# Patient Record
Sex: Male | Born: 1945 | Race: White | Hispanic: No | Marital: Married | Smoking: Former smoker
Health system: Southern US, Community
[De-identification: ages and names within clinical notes are randomized; demographics above are authoritative.]

## PROBLEM LIST (undated history)

## (undated) DIAGNOSIS — J45909 Unspecified asthma, uncomplicated: Secondary | ICD-10-CM

## (undated) DIAGNOSIS — H269 Unspecified cataract: Secondary | ICD-10-CM

## (undated) DIAGNOSIS — T7840XA Allergy, unspecified, initial encounter: Secondary | ICD-10-CM

## (undated) DIAGNOSIS — K219 Gastro-esophageal reflux disease without esophagitis: Secondary | ICD-10-CM

## (undated) DIAGNOSIS — M1990 Unspecified osteoarthritis, unspecified site: Secondary | ICD-10-CM

## (undated) DIAGNOSIS — I82409 Acute embolism and thrombosis of unspecified deep veins of unspecified lower extremity: Secondary | ICD-10-CM

## (undated) DIAGNOSIS — J189 Pneumonia, unspecified organism: Secondary | ICD-10-CM

## (undated) DIAGNOSIS — E785 Hyperlipidemia, unspecified: Secondary | ICD-10-CM

## (undated) DIAGNOSIS — I10 Essential (primary) hypertension: Secondary | ICD-10-CM

## (undated) DIAGNOSIS — D689 Coagulation defect, unspecified: Secondary | ICD-10-CM

## (undated) DIAGNOSIS — F419 Anxiety disorder, unspecified: Secondary | ICD-10-CM

## (undated) DIAGNOSIS — Z9289 Personal history of other medical treatment: Secondary | ICD-10-CM

## (undated) HISTORY — DX: Anxiety disorder, unspecified: F41.9

## (undated) HISTORY — DX: Gastro-esophageal reflux disease without esophagitis: K21.9

## (undated) HISTORY — DX: Coagulation defect, unspecified: D68.9

## (undated) HISTORY — PX: GANGLION CYST EXCISION: SHX1691

## (undated) HISTORY — DX: Unspecified cataract: H26.9

## (undated) HISTORY — PX: TONSILLECTOMY: SUR1361

## (undated) HISTORY — PX: HERNIA REPAIR: SHX51

## (undated) HISTORY — DX: Unspecified asthma, uncomplicated: J45.909

## (undated) HISTORY — DX: Hyperlipidemia, unspecified: E78.5

## (undated) HISTORY — DX: Essential (primary) hypertension: I10

## (undated) HISTORY — PX: VASCULAR SURGERY: SHX849

## (undated) HISTORY — DX: Allergy, unspecified, initial encounter: T78.40XA

## (undated) HISTORY — DX: Unspecified osteoarthritis, unspecified site: M19.90

## (undated) MED FILL — Dexamethasone Sodium Phosphate Inj 100 MG/10ML: INTRAMUSCULAR | Qty: 1 | Status: AC

## (undated) MED FILL — Azacitidine For Inj 100 MG: INTRAMUSCULAR | Qty: 14 | Status: AC

---

## 2006-12-09 ENCOUNTER — Ambulatory Visit (HOSPITAL_COMMUNITY): Admission: RE | Admit: 2006-12-09 | Discharge: 2006-12-09 | Payer: Self-pay | Admitting: Optometry

## 2006-12-25 ENCOUNTER — Ambulatory Visit (HOSPITAL_COMMUNITY): Admission: RE | Admit: 2006-12-25 | Discharge: 2006-12-25 | Payer: Self-pay | Admitting: Family Medicine

## 2010-12-27 LAB — CREATININE, SERUM
Creatinine, Ser: 0.93
GFR calc Af Amer: >60
GFR calc non Af Amer: >60

## 2015-06-02 DIAGNOSIS — M542 Cervicalgia: Secondary | ICD-10-CM | POA: Diagnosis not present

## 2015-06-05 DIAGNOSIS — I82C12 Acute embolism and thrombosis of left internal jugular vein: Secondary | ICD-10-CM | POA: Diagnosis not present

## 2015-06-05 DIAGNOSIS — J45909 Unspecified asthma, uncomplicated: Secondary | ICD-10-CM | POA: Diagnosis not present

## 2015-06-05 DIAGNOSIS — M7989 Other specified soft tissue disorders: Secondary | ICD-10-CM | POA: Diagnosis not present

## 2015-06-05 DIAGNOSIS — I2699 Other pulmonary embolism without acute cor pulmonale: Secondary | ICD-10-CM | POA: Diagnosis not present

## 2015-06-05 DIAGNOSIS — Z79899 Other long term (current) drug therapy: Secondary | ICD-10-CM | POA: Diagnosis not present

## 2015-06-05 DIAGNOSIS — I871 Compression of vein: Secondary | ICD-10-CM | POA: Diagnosis not present

## 2015-06-05 DIAGNOSIS — Z9981 Dependence on supplemental oxygen: Secondary | ICD-10-CM | POA: Diagnosis not present

## 2015-06-05 DIAGNOSIS — I82622 Acute embolism and thrombosis of deep veins of left upper extremity: Secondary | ICD-10-CM | POA: Diagnosis not present

## 2015-06-05 DIAGNOSIS — I8290 Acute embolism and thrombosis of unspecified vein: Secondary | ICD-10-CM | POA: Diagnosis not present

## 2015-06-05 DIAGNOSIS — Z7901 Long term (current) use of anticoagulants: Secondary | ICD-10-CM | POA: Diagnosis not present

## 2015-06-05 DIAGNOSIS — R221 Localized swelling, mass and lump, neck: Secondary | ICD-10-CM | POA: Diagnosis not present

## 2015-06-05 DIAGNOSIS — I82A12 Acute embolism and thrombosis of left axillary vein: Secondary | ICD-10-CM | POA: Diagnosis not present

## 2015-06-05 DIAGNOSIS — I82B12 Acute embolism and thrombosis of left subclavian vein: Secondary | ICD-10-CM | POA: Diagnosis not present

## 2015-06-05 DIAGNOSIS — R69 Illness, unspecified: Secondary | ICD-10-CM | POA: Diagnosis not present

## 2015-06-06 DIAGNOSIS — I82622 Acute embolism and thrombosis of deep veins of left upper extremity: Secondary | ICD-10-CM | POA: Diagnosis not present

## 2015-06-06 DIAGNOSIS — Z9981 Dependence on supplemental oxygen: Secondary | ICD-10-CM | POA: Diagnosis not present

## 2015-06-06 DIAGNOSIS — I82A12 Acute embolism and thrombosis of left axillary vein: Secondary | ICD-10-CM | POA: Diagnosis not present

## 2015-06-06 DIAGNOSIS — Z7901 Long term (current) use of anticoagulants: Secondary | ICD-10-CM | POA: Diagnosis not present

## 2015-06-07 DIAGNOSIS — I82B12 Acute embolism and thrombosis of left subclavian vein: Secondary | ICD-10-CM | POA: Diagnosis not present

## 2015-06-07 DIAGNOSIS — I82A12 Acute embolism and thrombosis of left axillary vein: Secondary | ICD-10-CM | POA: Diagnosis not present

## 2015-06-07 DIAGNOSIS — Z9981 Dependence on supplemental oxygen: Secondary | ICD-10-CM | POA: Diagnosis not present

## 2015-06-07 DIAGNOSIS — I82622 Acute embolism and thrombosis of deep veins of left upper extremity: Secondary | ICD-10-CM | POA: Diagnosis not present

## 2015-06-07 DIAGNOSIS — J45909 Unspecified asthma, uncomplicated: Secondary | ICD-10-CM | POA: Diagnosis not present

## 2015-06-07 DIAGNOSIS — Z7901 Long term (current) use of anticoagulants: Secondary | ICD-10-CM | POA: Diagnosis not present

## 2015-06-08 DIAGNOSIS — I82A12 Acute embolism and thrombosis of left axillary vein: Secondary | ICD-10-CM | POA: Diagnosis not present

## 2015-06-08 DIAGNOSIS — I2699 Other pulmonary embolism without acute cor pulmonale: Secondary | ICD-10-CM | POA: Diagnosis not present

## 2015-06-08 DIAGNOSIS — I82622 Acute embolism and thrombosis of deep veins of left upper extremity: Secondary | ICD-10-CM | POA: Diagnosis not present

## 2015-06-09 DIAGNOSIS — I2699 Other pulmonary embolism without acute cor pulmonale: Secondary | ICD-10-CM | POA: Insufficient documentation

## 2015-06-15 DIAGNOSIS — I82622 Acute embolism and thrombosis of deep veins of left upper extremity: Secondary | ICD-10-CM | POA: Diagnosis not present

## 2015-06-15 DIAGNOSIS — K219 Gastro-esophageal reflux disease without esophagitis: Secondary | ICD-10-CM | POA: Diagnosis not present

## 2015-06-15 DIAGNOSIS — R911 Solitary pulmonary nodule: Secondary | ICD-10-CM | POA: Diagnosis not present

## 2015-06-20 DIAGNOSIS — I82A12 Acute embolism and thrombosis of left axillary vein: Secondary | ICD-10-CM | POA: Diagnosis not present

## 2015-06-20 DIAGNOSIS — I721 Aneurysm of artery of upper extremity: Secondary | ICD-10-CM | POA: Diagnosis not present

## 2015-06-20 DIAGNOSIS — R911 Solitary pulmonary nodule: Secondary | ICD-10-CM | POA: Diagnosis not present

## 2015-06-20 DIAGNOSIS — Z7901 Long term (current) use of anticoagulants: Secondary | ICD-10-CM | POA: Diagnosis not present

## 2015-06-26 DIAGNOSIS — I721 Aneurysm of artery of upper extremity: Secondary | ICD-10-CM | POA: Diagnosis not present

## 2015-06-26 DIAGNOSIS — E756 Lipid storage disorder, unspecified: Secondary | ICD-10-CM | POA: Diagnosis not present

## 2015-06-26 DIAGNOSIS — I82A12 Acute embolism and thrombosis of left axillary vein: Secondary | ICD-10-CM | POA: Diagnosis not present

## 2015-06-26 DIAGNOSIS — I2699 Other pulmonary embolism without acute cor pulmonale: Secondary | ICD-10-CM | POA: Diagnosis not present

## 2015-06-26 DIAGNOSIS — D6859 Other primary thrombophilia: Secondary | ICD-10-CM | POA: Diagnosis not present

## 2015-06-26 DIAGNOSIS — I82B12 Acute embolism and thrombosis of left subclavian vein: Secondary | ICD-10-CM | POA: Diagnosis not present

## 2015-12-04 DIAGNOSIS — J4520 Mild intermittent asthma, uncomplicated: Secondary | ICD-10-CM | POA: Diagnosis not present

## 2015-12-04 DIAGNOSIS — Z23 Encounter for immunization: Secondary | ICD-10-CM | POA: Diagnosis not present

## 2015-12-04 DIAGNOSIS — Z0000 Encounter for general adult medical examination without abnormal findings: Secondary | ICD-10-CM | POA: Diagnosis not present

## 2015-12-04 DIAGNOSIS — R69 Illness, unspecified: Secondary | ICD-10-CM | POA: Diagnosis not present

## 2015-12-04 DIAGNOSIS — R7301 Impaired fasting glucose: Secondary | ICD-10-CM | POA: Diagnosis not present

## 2015-12-04 DIAGNOSIS — K219 Gastro-esophageal reflux disease without esophagitis: Secondary | ICD-10-CM | POA: Diagnosis not present

## 2015-12-04 DIAGNOSIS — Z6822 Body mass index (BMI) 22.0-22.9, adult: Secondary | ICD-10-CM | POA: Diagnosis not present

## 2015-12-04 DIAGNOSIS — E7800 Pure hypercholesterolemia, unspecified: Secondary | ICD-10-CM | POA: Diagnosis not present

## 2015-12-19 DIAGNOSIS — I82A12 Acute embolism and thrombosis of left axillary vein: Secondary | ICD-10-CM | POA: Diagnosis not present

## 2015-12-19 DIAGNOSIS — I82C12 Acute embolism and thrombosis of left internal jugular vein: Secondary | ICD-10-CM | POA: Diagnosis not present

## 2015-12-19 DIAGNOSIS — I82B12 Acute embolism and thrombosis of left subclavian vein: Secondary | ICD-10-CM | POA: Diagnosis not present

## 2015-12-19 DIAGNOSIS — R911 Solitary pulmonary nodule: Secondary | ICD-10-CM | POA: Diagnosis not present

## 2015-12-19 DIAGNOSIS — R918 Other nonspecific abnormal finding of lung field: Secondary | ICD-10-CM | POA: Diagnosis not present

## 2015-12-19 DIAGNOSIS — Z7982 Long term (current) use of aspirin: Secondary | ICD-10-CM | POA: Diagnosis not present

## 2015-12-19 DIAGNOSIS — I871 Compression of vein: Secondary | ICD-10-CM | POA: Diagnosis not present

## 2016-06-05 DIAGNOSIS — M7989 Other specified soft tissue disorders: Secondary | ICD-10-CM | POA: Diagnosis not present

## 2016-06-05 DIAGNOSIS — Z86718 Personal history of other venous thrombosis and embolism: Secondary | ICD-10-CM | POA: Diagnosis not present

## 2016-06-05 DIAGNOSIS — R600 Localized edema: Secondary | ICD-10-CM | POA: Diagnosis not present

## 2016-06-05 DIAGNOSIS — M79662 Pain in left lower leg: Secondary | ICD-10-CM | POA: Diagnosis not present

## 2016-06-05 DIAGNOSIS — M79605 Pain in left leg: Secondary | ICD-10-CM | POA: Diagnosis not present

## 2016-06-07 DIAGNOSIS — Z0100 Encounter for examination of eyes and vision without abnormal findings: Secondary | ICD-10-CM | POA: Diagnosis not present

## 2016-06-07 DIAGNOSIS — H2510 Age-related nuclear cataract, unspecified eye: Secondary | ICD-10-CM | POA: Diagnosis not present

## 2016-06-13 DIAGNOSIS — I82812 Embolism and thrombosis of superficial veins of left lower extremities: Secondary | ICD-10-CM | POA: Diagnosis not present

## 2016-10-22 DIAGNOSIS — S01512A Laceration without foreign body of oral cavity, initial encounter: Secondary | ICD-10-CM | POA: Diagnosis not present

## 2016-10-22 DIAGNOSIS — K130 Diseases of lips: Secondary | ICD-10-CM | POA: Diagnosis not present

## 2016-10-22 DIAGNOSIS — Z7289 Other problems related to lifestyle: Secondary | ICD-10-CM | POA: Diagnosis not present

## 2016-10-22 DIAGNOSIS — Z7901 Long term (current) use of anticoagulants: Secondary | ICD-10-CM | POA: Diagnosis not present

## 2016-10-22 DIAGNOSIS — Z7982 Long term (current) use of aspirin: Secondary | ICD-10-CM | POA: Diagnosis not present

## 2016-10-22 DIAGNOSIS — K148 Other diseases of tongue: Secondary | ICD-10-CM | POA: Diagnosis not present

## 2016-10-22 DIAGNOSIS — Z86718 Personal history of other venous thrombosis and embolism: Secondary | ICD-10-CM | POA: Diagnosis not present

## 2016-11-18 DIAGNOSIS — Z79899 Other long term (current) drug therapy: Secondary | ICD-10-CM | POA: Diagnosis not present

## 2016-11-18 DIAGNOSIS — J45909 Unspecified asthma, uncomplicated: Secondary | ICD-10-CM | POA: Diagnosis not present

## 2016-11-18 DIAGNOSIS — Z7901 Long term (current) use of anticoagulants: Secondary | ICD-10-CM | POA: Diagnosis not present

## 2016-11-18 DIAGNOSIS — S9002XA Contusion of left ankle, initial encounter: Secondary | ICD-10-CM | POA: Diagnosis not present

## 2016-11-18 DIAGNOSIS — W228XXA Striking against or struck by other objects, initial encounter: Secondary | ICD-10-CM | POA: Diagnosis not present

## 2016-11-18 DIAGNOSIS — Z86718 Personal history of other venous thrombosis and embolism: Secondary | ICD-10-CM | POA: Diagnosis not present

## 2016-11-18 DIAGNOSIS — S8012XA Contusion of left lower leg, initial encounter: Secondary | ICD-10-CM | POA: Diagnosis not present

## 2016-12-05 DIAGNOSIS — J4520 Mild intermittent asthma, uncomplicated: Secondary | ICD-10-CM | POA: Diagnosis not present

## 2016-12-05 DIAGNOSIS — K219 Gastro-esophageal reflux disease without esophagitis: Secondary | ICD-10-CM | POA: Diagnosis not present

## 2016-12-05 DIAGNOSIS — R69 Illness, unspecified: Secondary | ICD-10-CM | POA: Diagnosis not present

## 2016-12-05 DIAGNOSIS — Z86718 Personal history of other venous thrombosis and embolism: Secondary | ICD-10-CM | POA: Diagnosis not present

## 2016-12-05 DIAGNOSIS — R7301 Impaired fasting glucose: Secondary | ICD-10-CM | POA: Diagnosis not present

## 2016-12-23 DIAGNOSIS — R69 Illness, unspecified: Secondary | ICD-10-CM | POA: Diagnosis not present

## 2017-01-23 ENCOUNTER — Encounter: Payer: Self-pay | Admitting: Family Medicine

## 2017-01-23 ENCOUNTER — Other Ambulatory Visit: Payer: Self-pay

## 2017-01-23 ENCOUNTER — Ambulatory Visit (INDEPENDENT_AMBULATORY_CARE_PROVIDER_SITE_OTHER): Payer: Medicare HMO | Admitting: Family Medicine

## 2017-01-23 VITALS — BP 138/86 | HR 68 | Temp 98.7°F | Resp 16 | Ht 70.5 in | Wt 161.1 lb

## 2017-01-23 DIAGNOSIS — I4510 Unspecified right bundle-branch block: Secondary | ICD-10-CM

## 2017-01-23 DIAGNOSIS — R69 Illness, unspecified: Secondary | ICD-10-CM | POA: Diagnosis not present

## 2017-01-23 DIAGNOSIS — J45909 Unspecified asthma, uncomplicated: Secondary | ICD-10-CM | POA: Insufficient documentation

## 2017-01-23 DIAGNOSIS — J4520 Mild intermittent asthma, uncomplicated: Secondary | ICD-10-CM

## 2017-01-23 DIAGNOSIS — F4329 Adjustment disorder with other symptoms: Secondary | ICD-10-CM

## 2017-01-23 DIAGNOSIS — E785 Hyperlipidemia, unspecified: Secondary | ICD-10-CM

## 2017-01-23 DIAGNOSIS — K219 Gastro-esophageal reflux disease without esophagitis: Secondary | ICD-10-CM

## 2017-01-23 NOTE — Progress Notes (Signed)
Chief Complaint  Patient presents with  . Hypertension  This is a new patient to establish.  His old doctor retired. He is under care for asthma.  Has not used albuterol for years.  He uses Flovent just once a day.  He states he had asthma as a child and it flared up a couple years ago, but he has not had much trouble with it since then.  I discussed with him he could perhaps stop the Flovent.  He tells me his old doctor insisted that he continue taking it.  We will review this on another day. He has hyperlipidemia that he states is mild.  He has not on any medications for this. He has a history of having a DVT in his upper extremity.  He is on Xarelto ever since that time.  He does not have any bruising or bleeding.  He is reminded not to take any NSAID use while on the blood thinner.  He is also reminded that if he falls and bumps his head that he needs to be seen in the emergency department. He has GERD that is well controlled on omeprazole.  Never had any GI bleeding. He states he is up-to-date with his colonoscopies.  He was told he does not need to have them anymore since he is over 69. He has a prescription for Xanax.  He states he has his prescription because his wife has anxiety.  When his wife has a "meltdown" he takes 1 to cope with her.  He uses this infrequently. Otherwise he is healthy.  He is not overweight.  He exercises.  He does not smoke or drink.  He attends the [employer].  Patient Active Problem List   Diagnosis Date Noted  . Asthma 01/23/2017  . GERD (gastroesophageal reflux disease) 01/23/2017  . Hyperlipidemia 01/23/2017  . Mixed emotional features as adjustment reaction 01/23/2017  . Acute pulmonary embolism ([place]) 06/09/2015  . Acute deep vein thrombosis (DVT) of axillary vein of left upper extremity ([place]) 06/06/2015    Outpatient Encounter Medications as of 01/23/2017  Medication Sig  . albuterol (PROVENTIL HFA;VENTOLIN HFA) 108 (90 Base) MCG/ACT inhaler  Inhale into the lungs.  . ALPRAZolam (XANAX) 0.5 MG tablet Take 0.5 mg by mouth.  Marland Kitchen aspirin EC 81 MG tablet Take 81 mg by mouth.  . ferrous gluconate (FERGON) 240 (27 FE) MG tablet Take 240 mg 3 (three) times daily with meals by mouth.  . fluticasone (FLOVENT HFA) 110 MCG/ACT inhaler Inhale into the lungs.  . Multiple Vitamin (MULTIVITAMIN) capsule Take 1 capsule daily by mouth.  Marland Kitchen omeprazole (PRILOSEC) 20 MG capsule Take 20 mg by mouth.  Alveda Reasons 20 MG TABS tablet Take 20 mg daily by mouth.  . [DISCONTINUED] aspirin EC 81 MG tablet Take 81 mg daily by mouth.   No facility-administered encounter medications on file as of 01/23/2017.     Past Medical History:  Diagnosis Date  . Allergy   . Anxiety   . Arthritis   . Asthma   . Cataract   . Clotting disorder ([place])   . GERD (gastroesophageal reflux disease)   . Hyperlipidemia   . Hypertension     Past Surgical History:  Procedure Laterality Date  . HERNIA REPAIR    . TONSILLECTOMY      Social History   Socioeconomic History  . Marital status: Married    Spouse name: [name]  . Number of children: 0  . Years of education: 57  .  Highest education level: High school graduate  Social Needs  . Financial resource strain: Not hard at all  . Food insecurity - worry: Never true  . Food insecurity - inability: Never true  . Transportation needs - medical: No  . Transportation needs - non-medical: No  Occupational History  . Not on file  Tobacco Use  . Smoking status: Never Smoker  . Smokeless tobacco: Never Used  Substance and Sexual Activity  . Alcohol use: Yes  . Drug use: No  . Sexual activity: Not Currently  Other Topics Concern  . Not on file  Social History Narrative   Married to [place]   She has anxiety disorder    Family History  Problem Relation Age of Onset  . Cancer Mother        pancreatic  . Arthritis Father   . Early death Father 70       Lung infiltrate    Review of Systems  Constitutional:  Negative for chills, fever and weight loss.  HENT: Negative for congestion and hearing loss.   Eyes: Negative for blurred vision and pain.  Respiratory: Negative for cough and shortness of breath.   Cardiovascular: Negative for chest pain and leg swelling.  Gastrointestinal: Negative for abdominal pain, constipation, diarrhea and heartburn.  Genitourinary: Negative for dysuria and frequency.  Musculoskeletal: Negative for falls, joint pain and myalgias.  Neurological: Negative for dizziness, seizures and headaches.  Psychiatric/Behavioral: Negative for depression. The patient is not nervous/anxious and does not have insomnia.     BP 138/86   Pulse 68   Temp 98.7 F (37.1 C) (Temporal)   Resp 16   Ht 5' 10.5" (1.791 m)   Wt 161 lb 1.3 oz (73.1 kg)   SpO2 97%   BMI 22.79 kg/m   Physical Exam  Constitutional: He is oriented to person, place, and time. He appears well-developed and well-nourished.  HENT:  Head: Normocephalic and atraumatic.  Mouth/Throat: Oropharynx is clear and moist.  Teeth well repaired  Eyes: Conjunctivae are normal. Pupils are equal, round, and reactive to light.  Neck: Normal range of motion. Neck supple. No thyromegaly present.  Cardiovascular: Normal rate, regular rhythm and normal heart sounds.  Pulmonary/Chest: Effort normal and breath sounds normal. No respiratory distress.  Abdominal: Soft. Bowel sounds are normal.  Musculoskeletal: Normal range of motion. He exhibits no edema.  Lymphadenopathy:    He has no cervical adenopathy.  Neurological: He is alert and oriented to person, place, and time.  Gait normal  Skin: Skin is warm and dry.  Psychiatric: He has a normal mood and affect. His behavior is normal. Thought content normal.  Nursing note and vitals reviewed.  ASSESSMENT/PLAN:  1. Mild intermittent asthma without complication Asymptomatic  2. Gastroesophageal reflux disease, esophagitis presence not specified Controlled with  omeprazole  3. Hyperlipidemia, unspecified hyperlipidemia type Controlled with diet  4. 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 Gilbert's disease By history  6. RBBB (right bundle branch block) History - CBC - COMPLETE METABOLIC PANEL WITH GFR - Lipid panel - VITAMIN D 25 Hydroxy (Vit-D Deficiency, Fractures) - Urinalysis, Routine w reflex microscopic   Patient Instructions  Need records Dr Scotty Court Walk every day that you are able No change in  Medicines See me in a month Call sooner for problems     [name], MD

## 2017-01-23 NOTE — Patient Instructions (Addendum)
Need records Dr Scotty Court Walk every day that you are able No change in  Medicines See me in a month Call sooner for problems

## 2017-02-12 DIAGNOSIS — E785 Hyperlipidemia, unspecified: Secondary | ICD-10-CM | POA: Diagnosis not present

## 2017-02-12 DIAGNOSIS — I4510 Unspecified right bundle-branch block: Secondary | ICD-10-CM | POA: Diagnosis not present

## 2017-02-12 DIAGNOSIS — J4520 Mild intermittent asthma, uncomplicated: Secondary | ICD-10-CM | POA: Diagnosis not present

## 2017-02-12 DIAGNOSIS — R69 Illness, unspecified: Secondary | ICD-10-CM | POA: Diagnosis not present

## 2017-02-12 DIAGNOSIS — K219 Gastro-esophageal reflux disease without esophagitis: Secondary | ICD-10-CM | POA: Diagnosis not present

## 2017-02-13 LAB — COMPLETE METABOLIC PANEL WITH GFR
AG Ratio: 1.5 (calc) (ref 1.0–2.5)
ALBUMIN MSPROF: 4.1 g/dL (ref 3.6–5.1)
ALT: 22 U/L (ref 9–46)
AST: 23 U/L (ref 10–35)
Alkaline phosphatase (APISO): 73 U/L (ref 40–115)
BILIRUBIN TOTAL: 2.2 mg/dL — AB (ref 0.2–1.2)
BUN: 15 mg/dL (ref 7–25)
CALCIUM: 9.4 mg/dL (ref 8.6–10.3)
CHLORIDE: 103 mmol/L (ref 98–110)
CO2: 27 mmol/L (ref 20–32)
CREATININE: 0.98 mg/dL (ref 0.70–1.18)
GFR, EST AFRICAN AMERICAN: 89 mL/min/{1.73_m2} (ref >=60)
GFR, EST NON AFRICAN AMERICAN: 77 mL/min/{1.73_m2} (ref >=60)
GLUCOSE: 103 mg/dL — AB (ref 65–99)
Globulin: 2.7 g/dL (calc) (ref 1.9–3.7)
Potassium: 3.9 mmol/L (ref 3.5–5.3)
Sodium: 138 mmol/L (ref 135–146)
TOTAL PROTEIN: 6.8 g/dL (ref 6.1–8.1)

## 2017-02-13 LAB — URINALYSIS, ROUTINE W REFLEX MICROSCOPIC
Bilirubin Urine: NEGATIVE
GLUCOSE, UA: NEGATIVE
HGB URINE DIPSTICK: NEGATIVE
Ketones, ur: NEGATIVE
LEUKOCYTES UA: NEGATIVE
Nitrite: NEGATIVE
PROTEIN: NEGATIVE
Specific Gravity, Urine: 1.017 (ref 1.001–1.03)
pH: 7 (ref 5.0–8.0)

## 2017-02-13 LAB — CBC
HCT: 43.5 % (ref 38.5–50.0)
HEMOGLOBIN: 15.3 g/dL (ref 13.2–17.1)
MCH: 31 pg (ref 27.0–33.0)
MCHC: 35.2 g/dL (ref 32.0–36.0)
MCV: 88.2 fL (ref 80.0–100.0)
MPV: 10.8 fL (ref 7.5–12.5)
PLATELETS: 209 10*3/uL (ref 140–400)
RBC: 4.93 10*6/uL (ref 4.20–5.80)
RDW: 12.6 % (ref 11.0–15.0)
WBC: 5.8 10*3/uL (ref 3.8–10.8)

## 2017-02-13 LAB — LIPID PANEL
CHOL/HDL RATIO: 3.4 (calc) (ref <5.0)
Cholesterol: 202 mg/dL — ABNORMAL HIGH (ref <200)
HDL: 59 mg/dL (ref >40)
LDL CHOLESTEROL (CALC): 128 mg/dL — AB
Non-HDL Cholesterol (Calc): 143 mg/dL (calc) — ABNORMAL HIGH (ref <130)
TRIGLYCERIDES: 62 mg/dL (ref <150)

## 2017-02-13 LAB — VITAMIN D 25 HYDROXY (VIT D DEFICIENCY, FRACTURES): VIT D 25 HYDROXY: 35 ng/mL (ref 30–100)

## 2017-02-24 ENCOUNTER — Ambulatory Visit: Admitting: Family Medicine

## 2017-02-28 ENCOUNTER — Ambulatory Visit (INDEPENDENT_AMBULATORY_CARE_PROVIDER_SITE_OTHER): Payer: Medicare HMO | Admitting: Family Medicine

## 2017-02-28 ENCOUNTER — Encounter: Payer: Self-pay | Admitting: Family Medicine

## 2017-02-28 ENCOUNTER — Other Ambulatory Visit: Payer: Self-pay

## 2017-02-28 VITALS — BP 144/94 | HR 76 | Temp 98.9°F | Resp 18 | Ht 71.0 in | Wt 161.0 lb

## 2017-02-28 DIAGNOSIS — J4520 Mild intermittent asthma, uncomplicated: Secondary | ICD-10-CM

## 2017-02-28 DIAGNOSIS — E785 Hyperlipidemia, unspecified: Secondary | ICD-10-CM

## 2017-02-28 DIAGNOSIS — K219 Gastro-esophageal reflux disease without esophagitis: Secondary | ICD-10-CM

## 2017-02-28 MED ORDER — FLUTICASONE PROPIONATE HFA 110 MCG/ACT IN AERO: 2.0000 | INHALATION_SPRAY | Freq: Every day | RESPIRATORY_TRACT | 6 refills | Status: DC

## 2017-02-28 NOTE — Patient Instructions (Addendum)
You are doing well Continue to eat well and exercise See me in six months Call and come in sooner for any problems

## 2017-02-28 NOTE — Progress Notes (Signed)
Chief Complaint  Patient presents with  . Follow-up    1 month  Patient is here for routine follow-up.  He feels well.  No change in medications.  We did blood work at his last visit.  The results are reviewed with him today.  He does have mild hyperlipidemia with an LDL of 122.  HDL is good at 59. He does have a mild elevated bilirubin at 2.2.  He has a history of Gilberts disease. He eats well.  He exercises. I do not yet have his old records.  He believes his health maintenance is up-to-date.  Patient Active Problem List   Diagnosis Date Noted  . Asthma 01/23/2017  . GERD (gastroesophageal reflux disease) 01/23/2017  . Hyperlipidemia 01/23/2017  . Mixed emotional features as adjustment reaction 01/23/2017  . Acute pulmonary embolism ([place]) 06/09/2015  . Acute deep vein thrombosis (DVT) of axillary vein of left upper extremity ([place]) 06/06/2015    Outpatient Encounter Medications as of 02/28/2017  Medication Sig  . albuterol (PROVENTIL HFA;VENTOLIN HFA) 108 (90 Base) MCG/ACT inhaler Inhale into the lungs.  . ALPRAZolam (XANAX) 0.5 MG tablet Take 0.5 mg by mouth.  Marland Kitchen aspirin EC 81 MG tablet Take 81 mg by mouth.  . ferrous gluconate (FERGON) 240 (27 FE) MG tablet Take 240 mg 3 (three) times daily with meals by mouth.  . fluticasone (FLOVENT HFA) 110 MCG/ACT inhaler Inhale 2 puffs into the lungs daily.  . Multiple Vitamin (MULTIVITAMIN) capsule Take 1 capsule daily by mouth.  Marland Kitchen omeprazole (PRILOSEC) 20 MG capsule Take 20 mg by mouth.  Alveda Reasons 20 MG TABS tablet Take 20 mg daily by mouth.  . [DISCONTINUED] fluticasone (FLOVENT HFA) 110 MCG/ACT inhaler Inhale into the lungs.   No facility-administered encounter medications on file as of 02/28/2017.     Allergies  Allergen Reactions  . Amoxicillin Rash  . Cephalexin Rash  . Clindamycin Rash  . Sulfamethoxazole Rash  . Trimethoprim Rash    Review of Systems  Constitutional: Negative for activity change, appetite change and  unexpected weight change.  HENT: Negative for congestion and dental problem.   Eyes: Negative for photophobia and visual disturbance.  Respiratory: Negative for choking and shortness of breath.   Cardiovascular: Negative for chest pain, palpitations and leg swelling.  Gastrointestinal: Negative for constipation and diarrhea.  Genitourinary: Negative for difficulty urinating and frequency.  Musculoskeletal: Negative for arthralgias and back pain.  Neurological: Negative for dizziness and headaches.  Psychiatric/Behavioral: Negative for dysphoric mood. The patient is not nervous/anxious.     BP (!) 144/94 (BP Location: Left Arm, Patient Position: Sitting, Cuff Size: Normal)   Pulse 76   Temp 98.9 F (37.2 C) (Temporal)   Resp 18   Ht 5' 11" (1.803 m)   Wt 161 lb 0.6 oz (73 kg)   SpO2 98%   BMI 22.46 kg/m   Physical Exam  Constitutional: He is oriented to person, place, and time. He appears well-developed and well-nourished.  Appears fit and lean  HENT:  Head: Normocephalic and atraumatic.  Mouth/Throat: Oropharynx is clear and moist.  Eyes: Conjunctivae are normal. Pupils are equal, round, and reactive to light.  Neck: Normal range of motion. Neck supple. No thyromegaly present.  Cardiovascular: Normal rate, regular rhythm and normal heart sounds.  Pulmonary/Chest: Effort normal and breath sounds normal. No respiratory distress.  Abdominal: Soft. Bowel sounds are normal.  Musculoskeletal: Normal range of motion. He exhibits no edema.  Lymphadenopathy:    He has  no cervical adenopathy.  Neurological: He is alert and oriented to person, place, and time.  Gait normal  Skin: Skin is warm and dry.  Psychiatric: He has a normal mood and affect. His behavior is normal. Thought content normal.  Nursing note and vitals reviewed.   ASSESSMENT/PLAN:  1. Mild intermittent asthma without complication Asymptomatic  2. Gastroesophageal reflux disease, esophagitis presence not  specified Controlled  3. Hyperlipidemia, unspecified hyperlipidemia type Mild.  Treated with diet and exercise   Patient Instructions  You are doing well Continue to eat well and exercise See me in six months Call and come in sooner for any problems   [name], MD

## 2017-03-31 ENCOUNTER — Encounter: Payer: Self-pay | Admitting: Family Medicine

## 2017-03-31 DIAGNOSIS — R7303 Prediabetes: Secondary | ICD-10-CM | POA: Insufficient documentation

## 2017-06-17 DIAGNOSIS — I82A12 Acute embolism and thrombosis of left axillary vein: Secondary | ICD-10-CM | POA: Diagnosis not present

## 2017-06-17 DIAGNOSIS — I77810 Thoracic aortic ectasia: Secondary | ICD-10-CM | POA: Diagnosis not present

## 2017-06-17 DIAGNOSIS — R911 Solitary pulmonary nodule: Secondary | ICD-10-CM | POA: Diagnosis not present

## 2017-07-09 ENCOUNTER — Telehealth: Payer: Self-pay | Admitting: Family Medicine

## 2017-07-09 ENCOUNTER — Other Ambulatory Visit: Payer: Self-pay | Admitting: Family Medicine

## 2017-07-09 MED ORDER — PROMETHAZINE-DM 6.25-15 MG/5ML PO SYRP: 5.0000 mL | ORAL_SOLUTION | Freq: Four times a day (QID) | ORAL | 0 refills | Status: DC | PRN

## 2017-07-09 NOTE — Telephone Encounter (Signed)
Spoke with Sam at [facility]. She stated the cough syrup Dr.Nelson prescribed is on back order. She will call around to see if she can get it from somewhere else. I thanked her for her help.

## 2017-07-09 NOTE — Telephone Encounter (Signed)
Sam from [facility] [phone]  Left message to return her call regarding a rx on backorder that was prescribed to patient today.

## 2017-07-09 NOTE — Telephone Encounter (Signed)
Please advise.

## 2017-07-09 NOTE — Telephone Encounter (Signed)
Allergies, getting worse, cant sleep , needs something for cough. Can you call him something in

## 2017-07-17 ENCOUNTER — Telehealth: Payer: Self-pay | Admitting: Family Medicine

## 2017-07-17 NOTE — Telephone Encounter (Signed)
Patients wife called in stating that patient needs refill for xarelto. She said she is having trouble getting this request to our office through the pharmacy and our office.  I have explained that Dr.Hagler does not refill medications without seeing patients first. So I am scheduling appt for next availability.

## 2017-07-21 NOTE — Telephone Encounter (Signed)
Called and spoke with patient and his wife. They were looking at old bottle from another doctor. Explained that a prescription should be on file at the pharmacy from [facility] [place]. They verbalized understanding.

## 2017-07-21 NOTE — Telephone Encounter (Signed)
Please call patient. He does need the medication, Dr. Meda Coffee gave him 11 refills on 01/2017. He should have refills. Check on status of this with patient, we will refill until needed.

## 2017-07-21 NOTE — Telephone Encounter (Signed)
Chad Lamb

## 2017-07-22 ENCOUNTER — Ambulatory Visit: Payer: Medicare HMO | Admitting: Family Medicine

## 2017-08-19 ENCOUNTER — Encounter: Payer: Self-pay | Admitting: Family Medicine

## 2017-08-19 ENCOUNTER — Ambulatory Visit (HOSPITAL_COMMUNITY)
Admission: RE | Admit: 2017-08-19 | Discharge: 2017-08-19 | Disposition: A | Discharge status: Home / Self Care | Payer: Medicare HMO | Source: Ambulatory Visit | Attending: Family Medicine | Admitting: Family Medicine

## 2017-08-19 ENCOUNTER — Other Ambulatory Visit: Payer: Self-pay

## 2017-08-19 ENCOUNTER — Ambulatory Visit (INDEPENDENT_AMBULATORY_CARE_PROVIDER_SITE_OTHER): Payer: Medicare HMO | Admitting: Family Medicine

## 2017-08-19 VITALS — BP 156/94 | HR 68 | Temp 98.9°F | Resp 18 | Ht 69.5 in | Wt 161.1 lb

## 2017-08-19 DIAGNOSIS — J45909 Unspecified asthma, uncomplicated: Secondary | ICD-10-CM | POA: Diagnosis not present

## 2017-08-19 DIAGNOSIS — M79661 Pain in right lower leg: Secondary | ICD-10-CM | POA: Diagnosis not present

## 2017-08-19 DIAGNOSIS — Z79899 Other long term (current) drug therapy: Secondary | ICD-10-CM

## 2017-08-19 DIAGNOSIS — M79604 Pain in right leg: Secondary | ICD-10-CM | POA: Diagnosis not present

## 2017-08-19 DIAGNOSIS — R030 Elevated blood-pressure reading, without diagnosis of hypertension: Secondary | ICD-10-CM

## 2017-08-19 DIAGNOSIS — Z7901 Long term (current) use of anticoagulants: Secondary | ICD-10-CM | POA: Diagnosis not present

## 2017-08-19 DIAGNOSIS — K649 Unspecified hemorrhoids: Secondary | ICD-10-CM | POA: Diagnosis not present

## 2017-08-19 DIAGNOSIS — Z87828 Personal history of other (healed) physical injury and trauma: Secondary | ICD-10-CM | POA: Diagnosis not present

## 2017-08-19 DIAGNOSIS — Z23 Encounter for immunization: Secondary | ICD-10-CM

## 2017-08-19 DIAGNOSIS — Z86718 Personal history of other venous thrombosis and embolism: Secondary | ICD-10-CM

## 2017-08-19 MED ORDER — ALBUTEROL SULFATE HFA 108 (90 BASE) MCG/ACT IN AERS: 1.0000 | INHALATION_SPRAY | RESPIRATORY_TRACT | 1 refills | Status: DC | PRN

## 2017-08-19 MED ORDER — LIDOCAINE-HYDROCORTISONE ACE 3-0.5 % EX CREA: 1.0000 "application " | TOPICAL_CREAM | CUTANEOUS | 11 refills | Status: AC

## 2017-08-19 MED ORDER — XARELTO 20 MG PO TABS: 20.0000 mg | ORAL_TABLET | Freq: Every day | ORAL | 3 refills | Status: DC

## 2017-08-19 NOTE — Progress Notes (Signed)
Patient ID: Chad Lamb, male    DOB: [birth year], 72 y.o.   MRN: [id number]  Chief Complaint  Patient presents with  . Establish Care    Former [facility] patient. Discuss medications    Allergies Amoxicillin; Cephalexin; Clindamycin; Sulfamethoxazole; and Trimethoprim  Subjective:   Chad Lamb is a 72 y.o. male who presents to [facility] today.  HPI Here to establish care as a new patient visit. Has previously been seen by  Dr. Meda Coffee and Dr. Scotty Court. Has had a history of an upper extremity DVT which was found several years ago.  He has been on Xarelto since then with no recurrence in his symptoms.  He reports that has a lump in his lower left leg and that he comes in today b/c wants to make sure he does not have a clot in the leg.  He reports that he has hit his leg several times and wants to make sure it is not a clot. The leg has a knot that has been there 2 weeks, pain 2/10. The knot did have some associated swelling.  Patient reports that he got a new car and continually hits his leg on the door. No tobacco use.  Has been asthmatic for a long time.  Uses his Flovent as directed.  Does not ever have to use his albuterol.  Does swim for exercise multiple days a week.  Denies any chest pain, shortness of breath or dyspnea on exertion.  Denies any palpitations.  Reports she has been told by his PCP in the past that his blood pressure has been elevated but he is always tried to control it with his diet.  Has never been placed on any medication for his blood pressure. Has a history of reflux but has been cutting down on his Prilosec use.  Is now using it only 1/4 pill a day.  Is trying to stop the medication.  Denies any abdominal pain.  Denies any melena, bright red blood per rectum.  Has not had any lab work done for creatinine related to his Xarelto.  Reports he never misses his Xarelto.  Does occasionally use a very small dose of Xanax for anxiety.  Gets anxiety when he has to  go to the dentist or have a procedure performed.  Otherwise he does not take this medication.  Reports his mood is good.  Energy is good.  Is active in his church and community.  Married.    Past Medical History:  Diagnosis Date  . Allergy   . Anxiety   . Arthritis   . Asthma   . Cataract   . Clotting disorder ([place])   . GERD (gastroesophageal reflux disease)   . Hyperlipidemia   . Hypertension     Past Surgical History:  Procedure Laterality Date  . HERNIA REPAIR    . TONSILLECTOMY      Family History  Problem Relation Age of Onset  . Cancer Mother        pancreatic  . Arthritis Father   . Early death Father 58       Lung infiltrate     Social History   Socioeconomic History  . Marital status: Married    Spouse name: [name]  . Number of children: 0  . Years of education: 24  . Highest education level: High school graduate  Occupational History  . Not on file  Social Needs  . Financial resource strain: Not hard at all  .  Food insecurity:    Worry: Never true    Inability: Never true  . Transportation needs:    Medical: No    Non-medical: No  Tobacco Use  . Smoking status: Never Smoker  . Smokeless tobacco: Never Used  Substance and Sexual Activity  . Alcohol use: Yes  . Drug use: No  . Sexual activity: Not Currently  Lifestyle  . Physical activity:    Days per week: 3 days    Minutes per session: Not on file  . Stress: To some extent  Relationships  . Social connections:    Talks on phone: More than three times a week    Gets together: More than three times a week    Attends religious service: Never    Active member of club or organization: No    Attends meetings of clubs or organizations: Never    Relationship status: Married  Other Topics Concern  . Not on file  Social History Narrative   Grew up in [place], [place] and [place].    Retired.    Worked in [place]. 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    Current Outpatient Medications on File Prior to Visit  Medication Sig Dispense Refill  . ALPRAZolam (XANAX) 0.5 MG tablet Take 0.5 mg by mouth as needed.     . ferrous gluconate (FERGON) 240 (27 FE) MG tablet Take 240 mg by mouth 3 (three) times daily as needed.     . fluticasone (FLOVENT HFA) 110 MCG/ACT inhaler Inhale 2 puffs into the lungs daily. 1 Inhaler 6  . Multiple Vitamin (MULTIVITAMIN) capsule Take 1 capsule daily by mouth.    Marland Kitchen omeprazole (PRILOSEC OTC) 20 MG tablet Take 20 mg by mouth as directed.    . promethazine-dextromethorphan (PROMETHAZINE-DM) 6.25-15 MG/5ML syrup Take 5 mLs by mouth 4 (four) times daily as needed for cough. 240 mL 0  . sildenafil (VIAGRA) 50 MG tablet Take 50 mg by mouth as directed.     No current facility-administered medications on file prior to visit.     Review of Systems  Constitutional: Negative for appetite change, chills, fever and unexpected weight change.  HENT: Negative for trouble swallowing and voice change.   Eyes: Negative for visual disturbance.  Respiratory: Negative for cough, chest tightness, shortness of breath and wheezing.   Cardiovascular: Negative for chest pain, palpitations and leg swelling.  Gastrointestinal: Negative for abdominal pain, diarrhea, nausea and vomiting.  Genitourinary: Negative for decreased urine volume, dysuria and frequency.  Musculoskeletal:       Pain in right leg, lower calf.   Skin: Negative for rash.  Neurological: Negative for dizziness, tremors, syncope, facial asymmetry, weakness and headaches.  Hematological: Negative for adenopathy. Does not bruise/bleed easily.     Objective:   BP (!) 156/94 (BP Location: Left Arm, Patient Position: Sitting, Cuff Size: Normal)   Pulse 68   Temp 98.9 F (37.2 C) (Temporal)   Resp 18   Ht 5' 9.5" (1.765 m)   Wt 161 lb 1.9 oz (73.1 kg)   SpO2 97%   BMI 23.45 kg/m   Physical Exam  Constitutional: He is oriented to person, place, and time. He  appears well-developed and well-nourished.  HENT:  Head: Normocephalic and atraumatic.  Eyes: Pupils are equal, round, and reactive to light. Conjunctivae and EOM are normal. No scleral icterus.  Neck: Normal range of motion. Neck supple. No thyromegaly present.  Cardiovascular: Normal rate, regular rhythm  and normal heart sounds.  Pulmonary/Chest: Effort normal and breath sounds normal.  Abdominal: Soft. Bowel sounds are normal.  Musculoskeletal: He exhibits no edema.  Right lower extremity calf, slightly tender to plapation. Palpable small knot subcutaneously. No skin changes bilaterally.   Neurological: He is alert and oriented to person, place, and time. No cranial nerve deficit.  Skin: Skin is warm, dry and intact. Capillary refill takes less than 2 seconds.  Psychiatric: He has a normal mood and affect. His behavior is normal. Judgment and thought content normal.  Vitals reviewed.  Depression screen [facility] 2/9 08/19/2017 01/23/2017  Decreased Interest 0 0  Down, Depressed, Hopeless 0 0  PHQ - 2 Score 0 0   Assessment and Plan  1. Pain of right lower extremity Suspect secondary to  muscle contusion/bruise secondary to hitting his leg on the car.  However due to his risk factors will check for lower extremity DVT. - US Venous Img Lower Bilateral; Future  2. Elevated blood pressure reading without diagnosis of hypertension Patient will check his blood pressure readings at home.  He will purchase a cuff and bring it to his next office visit.  We discussed salt intake.   It is recommended that he will continue his exercise.  He will follow-up in 1 month or sooner if needed. -Counseled on the correct technique to check blood pressure. 3. History of DVT (deep vein thrombosis) Refill Xarelto.  Check kidney function/electrolytes today - XARELTO 20 MG TABS tablet; Take 1 tablet (20 mg total) by mouth daily.  Dispense: 90 tablet; Refill: 3  4. Moderate asthma, unspecified whether complicated,  unspecified whether persistent Long history of asthma since childhood.  Continue his Flovent.  Has not had uses albuterol but it is out of date. - albuterol (PROVENTIL HFA;VENTOLIN HFA) 108 (90 Base) MCG/ACT inhaler; Inhale 1-2 puffs into the lungs every 4 (four) hours as needed for wheezing or shortness of breath.  Dispense: 1 Inhaler; Refill: 1 - Pneumococcal conjugate vaccine 13-valent  5. High risk medication use  - COMPLETE METABOLIC PANEL WITH GFR  6. Hemorrhoids, unspecified hemorrhoid type Follow-up with GI as needed.  Refill medication. - Lidocaine-Hydrocortisone Ace 3-0.5 % CREA; Apply 1 application topically as directed.  Dispense: 30 Tube; Refill: 11  Return in about 1 month (around 09/16/2017) for follow up. [name], MD 08/19/2017

## 2017-08-19 NOTE — Patient Instructions (Signed)
Check BP readings and record. Bring your cuff to the next visit.   How to Take Your Blood Pressure You can take your blood pressure at home with a machine. You may need to check your blood pressure at home:  To check if you have high blood pressure (hypertension).  To check your blood pressure over time.  To make sure your blood pressure medicine is working.  Supplies needed: You will need a blood pressure machine, or monitor. You can buy one at a drugstore or online. When choosing one:  Choose one with an arm cuff.  Choose one that wraps around your upper arm. Only one finger should fit between your arm and the cuff.  Do not choose one that measures your blood pressure from your wrist or finger.  Your doctor can suggest a monitor. How to prepare Avoid these things for 30 minutes before checking your blood pressure:  Drinking caffeine.  Drinking alcohol.  Eating.  Smoking.  Exercising.  Five minutes before checking your blood pressure:  Pee.  Sit in a dining chair. Avoid sitting in a soft couch or armchair.  Be quiet. Do not talk.  How to take your blood pressure Follow the instructions that came with your machine. If you have a digital blood pressure monitor, these may be the instructions: 1. Sit up straight. 2. Place your feet on the floor. Do not cross your ankles or legs. 3. Rest your left arm at the level of your heart. You may rest it on a table, desk, or chair. 4. Pull up your shirt sleeve. 5. Wrap the blood pressure cuff around the upper part of your left arm. The cuff should be 1 inch (2.5 cm) above your elbow. It is best to wrap the cuff around bare skin. 6. Fit the cuff snugly around your arm. You should be able to place only one finger between the cuff and your arm. 7. Put the cord inside the groove of your elbow. 8. Press the power button. 9. Sit quietly while the cuff fills with air and loses air. 10. Write down the numbers on the screen. 11. Wait  2-3 minutes and then repeat steps 1-10.  What do the numbers mean? Two numbers make up your blood pressure. The first number is called systolic pressure. The second is called diastolic pressure. An example of a blood pressure reading is "120 over 80" (or 120/80). If you are an adult and do not have a medical condition, use this guide to find out if your blood pressure is normal: Normal  First number: below 120.  Second number: below 80. Elevated  First number: 120-129.  Second number: below 80. Hypertension stage 1  First number: 130-139.  Second number: 80-89. Hypertension stage 2  First number: 140 or above.  Second number: 88 or above. Your blood pressure is above normal even if only the top or bottom number is above normal. Follow these instructions at home:  Check your blood pressure as often as your doctor tells you to.  Take your monitor to your next doctor's appointment. Your doctor will: ? Make sure you are using it correctly. ? Make sure it is working right.  Make sure you understand what your blood pressure numbers should be.  Tell your doctor if your medicines are causing side effects. Contact a doctor if:  Your blood pressure keeps being high. Get help right away if:  Your first blood pressure number is higher than 180.  Your second blood pressure  number is higher than 120. This information is not intended to replace advice given to you by your health care provider. Make sure you discuss any questions you have with your health care provider. Document Released: 02/15/2008 Document Revised: 01/31/2016 Document Reviewed: 08/11/2015 Elsevier Interactive Patient Education  [employer].

## 2017-08-20 ENCOUNTER — Encounter: Payer: Self-pay | Admitting: Family Medicine

## 2017-08-21 ENCOUNTER — Encounter: Payer: Self-pay | Admitting: Family Medicine

## 2017-08-22 ENCOUNTER — Encounter: Payer: Self-pay | Admitting: Family Medicine

## 2017-09-11 ENCOUNTER — Other Ambulatory Visit: Payer: Self-pay

## 2017-09-11 ENCOUNTER — Encounter: Payer: Self-pay | Admitting: Family Medicine

## 2017-09-11 ENCOUNTER — Ambulatory Visit (INDEPENDENT_AMBULATORY_CARE_PROVIDER_SITE_OTHER): Payer: Medicare HMO | Admitting: Family Medicine

## 2017-09-11 VITALS — BP 142/92 | HR 68 | Temp 98.3°F | Resp 12 | Ht 70.0 in | Wt 162.1 lb

## 2017-09-11 DIAGNOSIS — R030 Elevated blood-pressure reading, without diagnosis of hypertension: Secondary | ICD-10-CM

## 2017-09-11 DIAGNOSIS — Z23 Encounter for immunization: Secondary | ICD-10-CM

## 2017-09-11 NOTE — Progress Notes (Signed)
Patient ID: Chad Lamb, male    DOB: [birth year], 72 y.o.   MRN: [id number]  Chief Complaint  Patient presents with  . Blood Pressure Issues    Allergies Amoxicillin; Cephalexin; Clindamycin; Sulfamethoxazole; and Trimethoprim  Subjective:   Chad Lamb is a 72 y.o. male who presents to [facility] today.  HPI Here for follow up of BP. Brings in readings for BP. Has been running well at home. Readings are ranging from 115-140/73-93. Reports that mainly has been running well. No problems. Energy is good. No CP. No SOB.  Has had a history of elevated blood pressures at the doctor's office before but then when he checks his readings at home they run well.  He brings in the readings today.  He reports that he feels good.  He is exercising by swimming several days a week.  Is physically active.  Eats a healthy diet.  Is taking the Xarelto for the blood clot.  Has not gotten his other labs done.  Does not have any complaints.  Is due for his tetanus shot.  Would be willing to get that today.  Is here to discuss his blood pressure.   Past Medical History:  Diagnosis Date  . Allergy   . Anxiety   . Arthritis   . Asthma   . Cataract   . Clotting disorder ([place])   . GERD (gastroesophageal reflux disease)   . Hyperlipidemia   . Hypertension     Past Surgical History:  Procedure Laterality Date  . HERNIA REPAIR    . TONSILLECTOMY      Family History  Problem Relation Age of Onset  . Cancer Mother        pancreatic  . Arthritis Father   . Early death Father 17       Lung infiltrate     Social History   Socioeconomic History  . Marital status: Married    Spouse name: [name]  . Number of children: 0  . Years of education: 73  . Highest education level: High school graduate  Occupational History  . Not on file  Social Needs  . Financial resource strain: Not hard at all  . Food insecurity:    Worry: Never true    Inability: Never true  . Transportation  needs:    Medical: No    Non-medical: No  Tobacco Use  . Smoking status: Never Smoker  . Smokeless tobacco: Never Used  Substance and Sexual Activity  . Alcohol use: Yes  . Drug use: No  . Sexual activity: Not Currently  Lifestyle  . Physical activity:    Days per week: 3 days    Minutes per session: Not on file  . Stress: To some extent  Relationships  . Social connections:    Talks on phone: More than three times a week    Gets together: More than three times a week    Attends religious service: Never    Active member of club or organization: No    Attends meetings of clubs or organizations: Never    Relationship status: Married  Other Topics Concern  . Not on file  Social History Narrative   Grew up in [place], [place] and [place].    Retired.    Worked in [place]. 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     Review of Systems  Constitutional: Negative for appetite change, chills, fever and unexpected weight  change.  Eyes: Negative for visual disturbance.  Respiratory: Negative for cough, chest tightness, shortness of breath and wheezing.   Cardiovascular: Negative for chest pain, palpitations and leg swelling.  Gastrointestinal: Negative for abdominal pain, diarrhea, nausea and vomiting.  Genitourinary: Negative for decreased urine volume, dysuria and frequency.  Skin: Negative for rash.  Neurological: Negative for dizziness, tremors, syncope, facial asymmetry, weakness and headaches.  Hematological: Negative for adenopathy. Does not bruise/bleed easily.  Psychiatric/Behavioral: Negative for dysphoric mood.     Objective:   BP (!) 142/92 (BP Location: Right Arm, Patient Position: Sitting, Cuff Size: Normal)   Pulse 68   Temp 98.3 F (36.8 C) (Temporal)   Resp 12   Ht 5' 10" (1.778 m)   Wt 162 lb 1.9 oz (73.5 kg)   SpO2 98%   BMI 23.26 kg/m   Physical Exam  Constitutional: He is oriented to person, place, and  time. He appears well-developed and well-nourished.  HENT:  Head: Normocephalic and atraumatic.  Eyes: Pupils are equal, round, and reactive to light. Conjunctivae and EOM are normal.  Neck: Normal range of motion. Neck supple. No thyromegaly present.  Cardiovascular: Normal rate, regular rhythm and normal heart sounds.  Pulmonary/Chest: Effort normal and breath sounds normal.  Musculoskeletal: He exhibits no edema.  Neurological: He is alert and oriented to person, place, and time. No cranial nerve deficit.  Skin: Skin is warm, dry and intact.  Psychiatric: He has a normal mood and affect. His behavior is normal. Thought content normal.  Vitals reviewed.    Assessment and Plan  1. White coat syndrome with high blood pressure but without hypertension Lifestyle modifications discussed with patient including a diet emphasizing vegetables, fruits, and whole grains. Limiting intake of sodium to less than 2,400 mg per day.  Recommendations discussed include consuming low-fat dairy products, poultry, fish, legumes, non-tropical vegetable oils, and nuts; and limiting intake of sweets, sugar-sweetened beverages, and red meat. Discussed following a plan such as the Dietary Approaches to Stop Hypertension (DASH) diet. Patient to read up on this diet.  Continue diet and exercise.  Continue to monitor blood pressure at home as needed.  Call with any questions or concerns.  Attention to salt was recommended patient does report that he eats out several times a week.  Discussed with patient in detail today that blood pressure medication would not be indicated unless his blood pressures consistently ran greater than 140/90.  2. Immunization due Vaccination given. - Td : Tetanus/diphtheria >73yo Preservative  free  Patient will schedule follow-up with new PCP/primary care office for 3 months.  He will continue visits with cardiology as directed.  He will call with any questions or concerns.  He will keep his  yearly physical as directed. No follow-ups on file. [name], MD 09/11/2017

## 2017-11-03 DIAGNOSIS — H25813 Combined forms of age-related cataract, bilateral: Secondary | ICD-10-CM | POA: Diagnosis not present

## 2017-11-03 DIAGNOSIS — H2511 Age-related nuclear cataract, right eye: Secondary | ICD-10-CM | POA: Diagnosis not present

## 2017-11-14 NOTE — Patient Instructions (Signed)
Your procedure is scheduled on: 11/24/2017  Report to [facility] at  640   AM.  Call this number if you have problems the morning of surgery: [phone]   Do not eat food or drink liquids :After Midnight.      Take these medicines the morning of surgery with A SIP OF WATER: None   Do not wear jewelry, make-up or nail polish.  Do not wear lotions, powders, or perfumes. You may wear deodorant.  Do not shave 48 hours prior to surgery.  Do not bring valuables to the hospital.  Contacts, dentures or bridgework may not be worn into surgery.  Leave suitcase in the car. After surgery it may be brought to your room.  For patients admitted to the hospital, checkout time is 11:00 AM the day of discharge.   Patients discharged the day of surgery will not be allowed to drive home.  :     Please read over the following fact sheets that you were given: Coughing and Deep Breathing, Surgical Site Infection Prevention, Anesthesia Post-op Instructions and Care and Recovery After Surgery    Cataract A cataract is a clouding of the lens of the eye. When a lens becomes cloudy, vision is reduced based on the degree and nature of the clouding. Many cataracts reduce vision to some degree. Some cataracts make people more near-sighted as they develop. Other cataracts increase glare. Cataracts that are ignored and become worse can sometimes look white. The white color can be seen through the pupil. CAUSES   Aging. However, cataracts may occur at any age, even in newborns.   Certain drugs.   Trauma to the eye.   Certain diseases such as diabetes.   Specific eye diseases such as chronic inflammation inside the eye or a sudden attack of a rare form of glaucoma.   Inherited or acquired medical problems.  SYMPTOMS   Gradual, progressive drop in vision in the affected eye.   Severe, rapid visual loss. This most often happens when trauma is the cause.  DIAGNOSIS  To detect a cataract, an eye doctor examines  the lens. Cataracts are best diagnosed with an exam of the eyes with the pupils enlarged (dilated) by drops.  TREATMENT  For an early cataract, vision may improve by using different eyeglasses or stronger lighting. If that does not help your vision, surgery is the only effective treatment. A cataract needs to be surgically removed when vision loss interferes with your everyday activities, such as driving, reading, or watching TV. A cataract may also have to be removed if it prevents examination or treatment of another eye problem. Surgery removes the cloudy lens and usually replaces it with a substitute lens (intraocular lens, IOL).  At a time when both you and your doctor agree, the cataract will be surgically removed. If you have cataracts in both eyes, only one is usually removed at a time. This allows the operated eye to heal and be out of danger from any possible problems after surgery (such as infection or poor wound healing). In rare cases, a cataract may be doing damage to your eye. In these cases, your caregiver may advise surgical removal right away. The vast majority of people who have cataract surgery have better vision afterward. HOME CARE INSTRUCTIONS  If you are not planning surgery, you may be asked to do the following:  Use different eyeglasses.   Use stronger or brighter lighting.   Ask your eye doctor about reducing your medicine dose  or changing medicines if it is thought that a medicine caused your cataract. Changing medicines does not make the cataract go away on its own.   Become familiar with your surroundings. Poor vision can lead to injury. Avoid bumping into things on the affected side. You are at a higher risk for tripping or falling.   Exercise extreme care when driving or operating machinery.   Wear sunglasses if you are sensitive to bright light or experiencing problems with glare.  SEEK IMMEDIATE MEDICAL CARE IF:   You have a worsening or sudden vision loss.    You notice redness, swelling, or increasing pain in the eye.   You have a fever.  Document Released: 03/04/2005 Document Revised: 02/21/2011 Document Reviewed: 10/26/2010 [facility] Patient Information 2012 [place].PATIENT INSTRUCTIONS POST-ANESTHESIA  IMMEDIATELY FOLLOWING SURGERY:  Do not drive or operate machinery for the first twenty four hours after surgery.  Do not make any important decisions for twenty four hours after surgery or while taking narcotic pain medications or sedatives.  If you develop intractable nausea and vomiting or a severe headache please notify your doctor immediately.  FOLLOW-UP:  Please make an appointment with your surgeon as instructed. You do not need to follow up with anesthesia unless specifically instructed to do so.  WOUND CARE INSTRUCTIONS (if applicable):  Keep a dry clean dressing on the anesthesia/puncture wound site if there is drainage.  Once the wound has quit draining you may leave it open to air.  Generally you should leave the bandage intact for twenty four hours unless there is drainage.  If the epidural site drains for more than 36-48 hours please call the anesthesia department.  QUESTIONS?:  Please feel free to call your physician or the hospital operator if you have any questions, and they will be happy to assist you.

## 2017-11-19 ENCOUNTER — Encounter (HOSPITAL_COMMUNITY)
Admission: RE | Admit: 2017-11-19 | Discharge: 2017-11-19 | Disposition: A | Discharge status: Home / Self Care | Payer: Medicare HMO | Source: Ambulatory Visit | Attending: Ophthalmology | Admitting: Ophthalmology

## 2017-11-19 ENCOUNTER — Other Ambulatory Visit: Payer: Self-pay

## 2017-11-19 ENCOUNTER — Encounter (HOSPITAL_COMMUNITY): Payer: Self-pay

## 2017-11-19 DIAGNOSIS — Z01810 Encounter for preprocedural cardiovascular examination: Secondary | ICD-10-CM | POA: Diagnosis not present

## 2017-11-19 DIAGNOSIS — Z01812 Encounter for preprocedural laboratory examination: Secondary | ICD-10-CM | POA: Diagnosis not present

## 2017-11-19 HISTORY — DX: Pneumonia, unspecified organism: J18.9

## 2017-11-19 HISTORY — DX: Acute embolism and thrombosis of unspecified deep veins of unspecified lower extremity: I82.409

## 2017-11-19 LAB — CBC
HCT: 44 % (ref 39.0–52.0)
HEMOGLOBIN: 14.9 g/dL (ref 13.0–17.0)
MCH: 30.3 pg (ref 26.0–34.0)
MCHC: 33.9 g/dL (ref 30.0–36.0)
MCV: 89.4 fL (ref 78.0–100.0)
Platelets: 190 10*3/uL (ref 150–400)
RBC: 4.92 MIL/uL (ref 4.22–5.81)
RDW: 13.4 % (ref 11.5–15.5)
WBC: 5.5 10*3/uL (ref 4.0–10.5)

## 2017-11-19 LAB — BASIC METABOLIC PANEL
ANION GAP: 8 (ref 5–15)
BUN: 12 mg/dL (ref 8–23)
CHLORIDE: 107 mmol/L (ref 98–111)
CO2: 25 mmol/L (ref 22–32)
Calcium: 9.3 mg/dL (ref 8.9–10.3)
Creatinine, Ser: 0.95 mg/dL (ref 0.61–1.24)
GFR calc Af Amer: >60 mL/min (ref >60)
GFR calc non Af Amer: >60 mL/min (ref >60)
GLUCOSE: 117 mg/dL — AB (ref 70–99)
POTASSIUM: 4 mmol/L (ref 3.5–5.1)
SODIUM: 140 mmol/L (ref 135–145)

## 2017-11-20 ENCOUNTER — Encounter: Payer: Self-pay | Admitting: Family Medicine

## 2017-11-24 ENCOUNTER — Ambulatory Visit (HOSPITAL_COMMUNITY)
Admission: RE | Admit: 2017-11-24 | Discharge: 2017-11-24 | Disposition: A | Discharge status: Home / Self Care | Payer: Medicare HMO | Source: Ambulatory Visit | Attending: Ophthalmology | Admitting: Ophthalmology

## 2017-11-24 ENCOUNTER — Ambulatory Visit (HOSPITAL_COMMUNITY): Payer: Medicare HMO | Admitting: Anesthesiology

## 2017-11-24 ENCOUNTER — Encounter (HOSPITAL_COMMUNITY)
Admission: RE | Disposition: A | Discharge status: Home / Self Care | Payer: Self-pay | Source: Ambulatory Visit | Attending: Ophthalmology

## 2017-11-24 ENCOUNTER — Encounter (HOSPITAL_COMMUNITY): Payer: Self-pay | Admitting: Ophthalmology

## 2017-11-24 DIAGNOSIS — J45909 Unspecified asthma, uncomplicated: Secondary | ICD-10-CM | POA: Insufficient documentation

## 2017-11-24 DIAGNOSIS — F419 Anxiety disorder, unspecified: Secondary | ICD-10-CM | POA: Insufficient documentation

## 2017-11-24 DIAGNOSIS — K219 Gastro-esophageal reflux disease without esophagitis: Secondary | ICD-10-CM | POA: Insufficient documentation

## 2017-11-24 DIAGNOSIS — H2511 Age-related nuclear cataract, right eye: Secondary | ICD-10-CM | POA: Insufficient documentation

## 2017-11-24 DIAGNOSIS — I10 Essential (primary) hypertension: Secondary | ICD-10-CM | POA: Diagnosis not present

## 2017-11-24 DIAGNOSIS — Z79899 Other long term (current) drug therapy: Secondary | ICD-10-CM | POA: Insufficient documentation

## 2017-11-24 DIAGNOSIS — I739 Peripheral vascular disease, unspecified: Secondary | ICD-10-CM | POA: Insufficient documentation

## 2017-11-24 DIAGNOSIS — Z87891 Personal history of nicotine dependence: Secondary | ICD-10-CM | POA: Diagnosis not present

## 2017-11-24 DIAGNOSIS — R69 Illness, unspecified: Secondary | ICD-10-CM | POA: Diagnosis not present

## 2017-11-24 HISTORY — PX: CATARACT EXTRACTION W/PHACO: SHX586

## 2017-11-24 SURGERY — PHACOEMULSIFICATION, CATARACT, WITH IOL INSERTION
Anesthesia: Monitor Anesthesia Care | Site: Eye | Laterality: Right

## 2017-11-24 MED ORDER — PROVISC 10 MG/ML IO SOLN
INTRAOCULAR | Status: DC | PRN
  Administered 2017-11-24: 0.85 mL via INTRAOCULAR

## 2017-11-24 MED ORDER — TETRACAINE HCL 0.5 % OP SOLN
1.0000 [drp] | OPHTHALMIC | Status: AC
  Administered 2017-11-24 (×3): 1 [drp] via OPHTHALMIC

## 2017-11-24 MED ORDER — LIDOCAINE HCL (PF) 1 % IJ SOLN
INTRAMUSCULAR | Status: DC | PRN
  Administered 2017-11-24: .7 mL

## 2017-11-24 MED ORDER — LACTATED RINGERS IV SOLN
INTRAVENOUS | Status: DC
  Administered 2017-11-24: 08:00:00 via INTRAVENOUS

## 2017-11-24 MED ORDER — PHENYLEPHRINE HCL 2.5 % OP SOLN
1.0000 [drp] | OPHTHALMIC | Status: AC
  Administered 2017-11-24 (×3): 1 [drp] via OPHTHALMIC

## 2017-11-24 MED ORDER — CYCLOPENTOLATE-PHENYLEPHRINE 0.2-1 % OP SOLN
1.0000 [drp] | OPHTHALMIC | Status: AC
  Administered 2017-11-24 (×3): 1 [drp] via OPHTHALMIC

## 2017-11-24 MED ORDER — EPINEPHRINE PF 1 MG/ML IJ SOLN
INTRAMUSCULAR | Status: AC
  Filled 2017-11-24: qty 1

## 2017-11-24 MED ORDER — MIDAZOLAM HCL 2 MG/2ML IJ SOLN
INTRAMUSCULAR | Status: DC | PRN
  Administered 2017-11-24: 2 mg via INTRAVENOUS

## 2017-11-24 MED ORDER — LIDOCAINE HCL 3.5 % OP GEL
1.0000 "application " | Freq: Once | OPHTHALMIC | Status: AC
  Administered 2017-11-24: 1 via OPHTHALMIC

## 2017-11-24 MED ORDER — POVIDONE-IODINE 5 % OP SOLN
OPHTHALMIC | Status: DC | PRN
  Administered 2017-11-24: 1 via OPHTHALMIC

## 2017-11-24 MED ORDER — EPINEPHRINE PF 1 MG/ML IJ SOLN
INTRAOCULAR | Status: DC | PRN
  Administered 2017-11-24: 500 mL

## 2017-11-24 MED ORDER — BSS IO SOLN
INTRAOCULAR | Status: DC | PRN
  Administered 2017-11-24: 15 mL via INTRAOCULAR

## 2017-11-24 MED ORDER — NEOMYCIN-POLYMYXIN-DEXAMETH 3.5-10000-0.1 OP SUSP
OPHTHALMIC | Status: DC | PRN
  Administered 2017-11-24: 2 [drp] via OPHTHALMIC

## 2017-11-24 SURGICAL SUPPLY — 14 items
CLOTH BEACON ORANGE TIMEOUT ST (SAFETY) ×1 IMPLANT
EYE SHIELD UNIVERSAL CLEAR (GAUZE/BANDAGES/DRESSINGS) ×1 IMPLANT
GLOVE BIOGEL PI IND STRL 6.5 (GLOVE) IMPLANT
GLOVE BIOGEL PI IND STRL 7.0 (GLOVE) IMPLANT
GLOVE BIOGEL PI INDICATOR 6.5 (GLOVE) ×1
GLOVE BIOGEL PI INDICATOR 7.0 (GLOVE) ×2
GOWN STRL REUS W/ TWL LRG LVL3 (GOWN DISPOSABLE) IMPLANT
GOWN STRL REUS W/TWL LRG LVL3 (GOWN DISPOSABLE) ×1
LENS ALC ACRYL/TECN (Ophthalmic Related) ×1 IMPLANT
PAD ARMBOARD 7.5X6 YLW CONV (MISCELLANEOUS) ×1 IMPLANT
SYRINGE LUER LOK 1CC (MISCELLANEOUS) ×1 IMPLANT
TAPE SURG TRANSPORE 1 IN (GAUZE/BANDAGES/DRESSINGS) IMPLANT
TAPE SURGICAL TRANSPORE 1 IN (GAUZE/BANDAGES/DRESSINGS) ×1
WATER STERILE IRR 250ML POUR (IV SOLUTION) ×1 IMPLANT

## 2017-11-24 NOTE — Transfer of Care (Signed)
Immediate Anesthesia Transfer of Care Note  Patient: Chad Lamb  Procedure(s) Performed: CATARACT EXTRACTION PHACO AND INTRAOCULAR LENS PLACEMENT RIGHT EYE (Right Eye)  Patient Location: Short Stay  Anesthesia Type:MAC  Level of Consciousness: awake and patient cooperative  Airway & Oxygen Therapy: Patient Spontanous Breathing  Post-op Assessment: Report given to RN and Post -op Vital signs reviewed and stable  Post vital signs: Reviewed and stable  Last Vitals:  Vitals Value Taken Time  BP    Temp    Pulse    Resp    SpO2      Last Pain:  Vitals:   11/24/17 0808  TempSrc: Oral  PainSc:          Complications: No apparent anesthesia complications

## 2017-11-24 NOTE — H&P (Signed)
I have reviewed the H&P, the patient was re-examined, and I have identified no interval changes in medical condition and plan of care since the history and physical of record

## 2017-11-24 NOTE — Anesthesia Postprocedure Evaluation (Signed)
Anesthesia Post Note  Patient: Chad Lamb  Procedure(s) Performed: CATARACT EXTRACTION PHACO AND INTRAOCULAR LENS PLACEMENT RIGHT EYE (Right Eye)  Patient location during evaluation: Short Stay Anesthesia Type: MAC Level of consciousness: awake and alert and patient cooperative Pain management: pain level controlled Vital Signs Assessment: post-procedure vital signs reviewed and stable Respiratory status: spontaneous breathing Cardiovascular status: stable Postop Assessment: no apparent nausea or vomiting Anesthetic complications: no     Last Vitals:  Vitals:   11/24/17 0709 11/24/17 0808  BP: (!) 152/92 (!) 156/96  Pulse: (!) 54 (!) 56  Resp: 18   Temp: 36.8 C 36.7 C  SpO2: 98% 96%    Last Pain:  Vitals:   11/24/17 0808  TempSrc: Oral  PainSc: 0-No pain                 Karie Skowron

## 2017-11-24 NOTE — Anesthesia Preprocedure Evaluation (Signed)
Anesthesia Evaluation  Patient identified by MRN, date of birth, ID band Patient awake    Reviewed: Allergy & Precautions, NPO status , Patient's Chart, lab work & pertinent test results  Airway Mallampati: II  TM Distance: >3 FB Neck ROM: Full    Dental no notable dental hx. (+) Teeth Intact   Pulmonary neg pulmonary ROS, asthma , pneumonia, resolved, former smoker,  flovent qd Alb only when swimming , but states doesn't help , so he doesn't use it   Pulmonary exam normal breath sounds clear to auscultation       Cardiovascular Exercise Tolerance: Good hypertension, + Peripheral Vascular Disease  negative cardio ROS Normal cardiovascular examI Rhythm:Regular Rate:Normal     Neuro/Psych Anxiety negative neurological ROS  negative psych ROS   GI/Hepatic negative GI ROS, Neg liver ROS, GERD  Medicated and Controlled,  Endo/Other  negative endocrine ROS  Renal/GU negative Renal ROS  negative genitourinary   Musculoskeletal negative musculoskeletal ROS (+) Arthritis , Osteoarthritis,    Abdominal   Peds negative pediatric ROS (+)  Hematology negative hematology ROS (+)   Anesthesia Other Findings   Reproductive/Obstetrics negative OB ROS                             Anesthesia Physical Anesthesia Plan  ASA: II  Anesthesia Plan: MAC   Post-op Pain Management:    Induction:   PONV Risk Score and Plan:   Airway Management Planned: Simple Face Mask and Nasal Cannula  Additional Equipment:   Intra-op Plan:   Post-operative Plan:   Informed Consent:   Dental advisory given  Plan Discussed with: CRNA  Anesthesia Plan Comments:         Anesthesia Quick Evaluation

## 2017-11-24 NOTE — Op Note (Signed)
Date of Admission: 11/24/2017  Date of Surgery: 11/24/2017  Pre-Op Dx: Cataract Right  Eye  Post-Op Dx: Senile Nuclear Cataract  Right  Eye,  Dx Code H25.11  Surgeon: [name], M.D.  Assistants: None  Anesthesia: Topical with MAC  Indications: Painless, progressive loss of vision with compromise of daily activities.  Surgery: Cataract Extraction with Intraocular lens Implant Right Eye  Discription: The patient had dilating drops and viscous lidocaine placed into the Right eye in the pre-op holding area. After transfer to the operating room, a time out was performed. The patient was then prepped and draped. Beginning with a 3m blade a paracentesis port was made at the surgeon's 2 o'clock position. The anterior chamber was then filled with 1% non-preserved lidocaine. This was followed by filling the anterior chamber with Provisc.  A 2.471mkeratome blade was used to make a clear corneal incision at the temporal limbus.  A bent cystatome needle was used to create a continuous tear capsulotomy. Hydrodissection was performed with balanced salt solution on a Fine canula. The lens nucleus was then removed using the phacoemulsification handpiece. Residual cortex was removed with the I&A handpiece. The anterior chamber and capsular bag were refilled with Provisc. A posterior chamber intraocular lens was placed into the capsular bag with it's injector. The implant was positioned with the Kuglan hook. The Provisc was then removed from the anterior chamber and capsular bag with the I&A handpiece. Stromal hydration of the main incision and paracentesis port was performed with BSS on a Fine canula. The wounds were tested for leak which was negative. The patient tolerated the procedure well. There were no operative complications. The patient was then transferred to the recovery room in stable condition.  Complications: None  Specimen: None  EBL: None  Prosthetic device: J&J Technis, PCB00, power 10.5, SN  [id number]

## 2017-11-24 NOTE — Anesthesia Procedure Notes (Signed)
Procedure Name: MAC Date/Time: 11/24/2017 7:52 AM Performed by: Vista Deck, CRNA Pre-anesthesia Checklist: Patient identified, Emergency Drugs available, Suction available, Timeout performed and Patient being monitored Patient Re-evaluated:Patient Re-evaluated prior to induction Oxygen Delivery Method: Nasal Cannula

## 2017-11-25 ENCOUNTER — Encounter (HOSPITAL_COMMUNITY): Payer: Self-pay | Admitting: Ophthalmology

## 2017-12-01 DIAGNOSIS — H2512 Age-related nuclear cataract, left eye: Secondary | ICD-10-CM | POA: Diagnosis not present

## 2017-12-08 ENCOUNTER — Encounter: Payer: Self-pay | Admitting: Family Medicine

## 2017-12-08 ENCOUNTER — Other Ambulatory Visit: Payer: Self-pay

## 2017-12-08 ENCOUNTER — Ambulatory Visit (INDEPENDENT_AMBULATORY_CARE_PROVIDER_SITE_OTHER): Payer: Medicare HMO | Admitting: Family Medicine

## 2017-12-08 VITALS — BP 164/90 | HR 64 | Temp 98.4°F | Resp 12 | Ht 71.0 in | Wt 162.1 lb

## 2017-12-08 DIAGNOSIS — L989 Disorder of the skin and subcutaneous tissue, unspecified: Secondary | ICD-10-CM

## 2017-12-08 DIAGNOSIS — Z23 Encounter for immunization: Secondary | ICD-10-CM | POA: Diagnosis not present

## 2017-12-08 DIAGNOSIS — I10 Essential (primary) hypertension: Secondary | ICD-10-CM | POA: Diagnosis not present

## 2017-12-08 DIAGNOSIS — Z1211 Encounter for screening for malignant neoplasm of colon: Secondary | ICD-10-CM | POA: Diagnosis not present

## 2017-12-08 DIAGNOSIS — Z0000 Encounter for general adult medical examination without abnormal findings: Secondary | ICD-10-CM | POA: Diagnosis not present

## 2017-12-08 DIAGNOSIS — R7303 Prediabetes: Secondary | ICD-10-CM | POA: Diagnosis not present

## 2017-12-08 DIAGNOSIS — Z8719 Personal history of other diseases of the digestive system: Secondary | ICD-10-CM

## 2017-12-08 DIAGNOSIS — R030 Elevated blood-pressure reading, without diagnosis of hypertension: Secondary | ICD-10-CM

## 2017-12-08 NOTE — Progress Notes (Signed)
Patient ID: Chad Lamb, male    DOB: July 15, 1945, 72 y.o.   MRN: [id number]  Chief Complaint  Patient presents with  . Annual Exam    Allergies Cephalosporins; Amoxapine and related; Amoxicillin; Cephalexin; Clindamycin; Clindamycin/lincomycin; Sulfamethoxazole; Sulfamethoxazole; and Trimethoprim  Subjective:   Chad Lamb is a 72 y.o. male who presents to [facility] today.  HPI Here for CPE. Had cataract surgery several weeks ago. Reports that has not been able to do his swimming b/c of the surgery. Feels that the swimming helps with his stress and energy state. Feels good. Energy is good. Mood is good.  Has a history of whitecoat hypertension.  Reports blood pressures been running well at home.  Is taking all of his medications as directed.  Reports he is not sure when his last colonoscopy was performed.  Has a history of external hemorrhoids with associated bleeding.  Reports that he still has rectal bleeding from time to time that he assumes is related to the hemorrhoids.  He is not having any abdominal pain or rectal pain.  Bowel movements are normal.  Reports that he does take an iron pill for several days if he has some rectal bleeding.  Is not having any constipation.  Is not sure when was the last time he had an episode of rectal bleeding.   Reports that he does have a history of skin cancer.  Reports that he has had an area on his right forearm which is been present for "quite some time".  Reports that it never seems to heal.  Always seems to be slightly red and the skin seems to peel off.  Is concerned this could be an area of skin cancer.    Past Medical History:  Diagnosis Date  . Allergy   . Anxiety   . Arthritis   . Asthma   . Cataract   . Clotting disorder ([place])   . DVT (deep venous thrombosis) ([place])   . GERD (gastroesophageal reflux disease)   . Hyperlipidemia   . Hypertension   . Pneumonia     Past Surgical History:  Procedure Laterality  Date  . CATARACT EXTRACTION W/PHACO Right 11/24/2017   Procedure: CATARACT EXTRACTION PHACO AND INTRAOCULAR LENS PLACEMENT RIGHT EYE;  Surgeon: [name], MD;  Location: [facility];  Service: Ophthalmology;  Laterality: Right;  CDE: 8.61  . HERNIA REPAIR    . TONSILLECTOMY    . VASCULAR SURGERY      Family History  Problem Relation Age of Onset  . Cancer Mother        pancreatic  . Arthritis Father   . Early death Father 80       Lung infiltrate     Social History   Socioeconomic History  . Marital status: Married    Spouse name: [name]  . Number of children: 0  . Years of education: 66  . Highest education level: High school graduate  Occupational History  . Not on file  Social Needs  . Financial resource strain: Not hard at all  . Food insecurity:    Worry: Never true    Inability: Never true  . Transportation needs:    Medical: No    Non-medical: No  Tobacco Use  . Smoking status: Former Smoker    Years: 2.00    Types: Pipe    Last attempt to quit: 03/21/1966    Years since quitting: 51.7  . Smokeless tobacco: Never Used  Substance and  Sexual Activity  . Alcohol use: Yes    Alcohol/week: 1.0 standard drinks    Types: 1 Cans of beer per week    Comment: 1 can of beer a week  . Drug use: Never  . Sexual activity: Not Currently  Lifestyle  . Physical activity:    Days per week: 3 days    Minutes per session: Not on file  . Stress: To some extent  Relationships  . Social connections:    Talks on phone: More than three times a week    Gets together: More than three times a week    Attends religious service: Never    Active member of club or organization: No    Attends meetings of clubs or organizations: Never    Relationship status: Married  Other Topics Concern  . Not on file  Social History Narrative   ** Merged History Encounter **       Grew up in [place], [place] and [place].  Retired.  Worked in [employer]. 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    Current Outpatient Medications on File Prior to Visit  Medication Sig Dispense Refill  . albuterol (PROVENTIL HFA;VENTOLIN HFA) 108 (90 Base) MCG/ACT inhaler Inhale 1-2 puffs into the lungs every 6 (six) hours as needed for wheezing or shortness of breath.    . ALPRAZolam (XANAX) 0.5 MG tablet Take 0.5 mg by mouth daily.    Marland Kitchen aspirin EC 81 MG tablet Take 81 mg by mouth daily.    . ferrous gluconate (FERGON) 240 (27 FE) MG tablet Take 240 mg by mouth 3 (three) times daily as needed.     . fluticasone (FLOVENT HFA) 110 MCG/ACT inhaler Inhale 2 puffs into the lungs daily. 1 Inhaler 6  . Lidocaine-Hydrocortisone Ace 3-0.5 % CREA Apply 1 application topically as directed. 30 Tube 11  . Multiple Vitamin (MULTIVITAMIN) capsule Take 1 capsule daily by mouth.    . Multiple Vitamins-Minerals (CENTRUM ADULTS PO) Take 1 tablet by mouth daily.    Marland Kitchen omeprazole (PRILOSEC OTC) 20 MG tablet Take 5 mg by mouth as directed.     . rivaroxaban (XARELTO) 20 MG TABS tablet Take 20 mg by mouth daily with supper.    . sildenafil (VIAGRA) 50 MG tablet Take 50 mg by mouth as directed.     No current facility-administered medications on file prior to visit.      Review of Systems  Constitutional: Negative for activity change, appetite change, chills, diaphoresis, fatigue, fever and unexpected weight change.  HENT: Negative for dental problem, ear discharge, hearing loss, nosebleeds, postnasal drip, rhinorrhea, sinus pain, tinnitus, trouble swallowing and voice change.        Has allergy issues every spring and fall. Uses some OTC allergy medication and then it goes away.   Eyes: Negative for visual disturbance.  Respiratory: Negative for cough, chest tightness, shortness of breath and wheezing.   Cardiovascular: Negative for chest pain, palpitations and leg swelling.  Gastrointestinal: Negative for abdominal distention, abdominal pain, anal bleeding, blood in stool,  constipation, diarrhea, nausea and vomiting.       Has had a history of rectal bleeding. Has had occasional rectal bleeding. Uses some hemorrhoid cream from time to time. Due for screening colonoscopy.   Endocrine: Negative for polyphagia and polyuria.  Genitourinary: Negative for decreased urine volume, difficulty urinating, discharge, dysuria, frequency, hematuria and testicular pain.  Musculoskeletal: Negative for arthralgias, back pain, gait problem, joint  swelling and myalgias.  Skin: Negative for rash.  Neurological: Negative for dizziness, tremors, syncope, facial asymmetry, weakness, light-headedness, numbness and headaches.  Hematological: Negative for adenopathy. Does not bruise/bleed easily.  Psychiatric/Behavioral: Negative for agitation, behavioral problems, decreased concentration, dysphoric mood, self-injury and sleep disturbance. The patient is not nervous/anxious and is not hyperactive.      Objective:   BP (!) 164/90 (BP Location: Left Arm, Patient Position: Sitting, Cuff Size: Normal)   Pulse 64   Temp 98.4 F (36.9 C) (Temporal)   Resp 12   Ht 5' 11" (1.803 m)   Wt 162 lb 1.9 oz (73.5 kg)   SpO2 96% Comment: room air  BMI 22.61 kg/m   Physical Exam  Constitutional: He is oriented to person, place, and time. He appears well-developed and well-nourished. No distress.  HENT:  Head: Normocephalic and atraumatic.  Right Ear: External ear normal.  Left Ear: External ear normal.  Nose: Nose normal.  Mouth/Throat: Oropharynx is clear and moist. No oropharyngeal exudate.  Eyes: Pupils are equal, round, and reactive to light. Conjunctivae and EOM are normal. No scleral icterus.  Neck: Normal range of motion. Neck supple. No JVD present. No tracheal deviation present. No thyromegaly present.  Cardiovascular: Normal rate, regular rhythm, normal heart sounds and intact distal pulses.  Pulmonary/Chest: Effort normal and breath sounds normal. No respiratory distress. He has  no wheezes.  Abdominal: Soft. Bowel sounds are normal. He exhibits no distension. There is no tenderness. There is no guarding.  Genitourinary:  Genitourinary Comments: Defers GU exam.  Musculoskeletal: Normal range of motion. He exhibits no edema.  Lymphadenopathy:    He has no cervical adenopathy.  Neurological: He is alert and oriented to person, place, and time. He displays normal reflexes. No cranial nerve deficit or sensory deficit. He exhibits normal muscle tone. Coordination normal.  Skin: Skin is warm, dry and intact. He is not diaphoretic.  Right forearm with approximate 0.4 cm irregular shaped erythematous macule.  Multiple cherry angiomas present on skin.  Scar on left forearm consistent with prior biopsy/removal surgery for skin cancer.  Psychiatric: He has a normal mood and affect. His behavior is normal. Judgment and thought content normal.  Vitals reviewed.  Depression screen [facility] 2/9 12/08/2017 09/11/2017 08/19/2017 01/23/2017  Decreased Interest 0 0 0 0  Down, Depressed, Hopeless 0 0 0 0  PHQ - 2 Score 0 0 0 0    Assessment and Plan  1. Well adult exam Age-appropriate anticipatory guidance was given and discussed with patient. Continue multivitamin p.o. Daily. Continue medications as directed. Keep scheduled visits with cardiology as recommended. - CBC with Differential/Platelet - PSA, total and free  2. White coat syndrome with high blood pressure but without hypertension Continue to eat a low-salt diet and continue to maintain exercise patterns. Monitor blood pressure at home.  Readings reviewed and discussed from home which are within the range of normal. Call with any concerns or elevated readings.  3. Need for immunization against influenza - Flu Vaccine QUAD 36+ mos IM  4. History of hemorrhoids Continue external hemorrhoidal cream as needed.  Refer to gastroenterology for evaluation. - Ambulatory referral to Gastroenterology  5. Screening for colon  cancer Patient is having rectal bleeding, most likely secondary to hemorrhoids.  He is also due for screening colonoscopy.  He wishes to proceed with this examination.  Referral was placed at this time.  He was counseled regarding worrisome signs and symptoms of rectal bleeding and if those occur he should  contact medical help. - Ambulatory referral to Gastroenterology  6. Prediabetes Patient's diagnosis of prediabetes was discussed.  Continue with exercise and dietary modifications.  Check hemoglobin A1c today. 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  Bilirubin stable  8. Skin lesion Referral to dermatology for evaluation of lesion and for skin survey.  Sun protection discussed. - Ambulatory referral to Dermatology Recommend follow-up with new PCP office in 3 months or sooner if needed.  Immunizations reviewed.  Vaccinations discussed. No follow-ups on file. [name], MD 12/08/2017

## 2017-12-09 ENCOUNTER — Encounter: Payer: Self-pay | Admitting: Family Medicine

## 2017-12-09 LAB — CBC WITH DIFFERENTIAL/PLATELET
Basophils Absolute: 49 cells/uL (ref 0–200)
Basophils Relative: 0.9 %
Eosinophils Absolute: 248 cells/uL (ref 15–500)
Eosinophils Relative: 4.6 %
HCT: 44.6 % (ref 38.5–50.0)
Hemoglobin: 15.3 g/dL (ref 13.2–17.1)
LYMPHS ABS: 1561 {cells}/uL (ref 850–3900)
MCH: 29.8 pg (ref 27.0–33.0)
MCHC: 34.3 g/dL (ref 32.0–36.0)
MCV: 86.9 fL (ref 80.0–100.0)
MPV: 10.5 fL (ref 7.5–12.5)
Monocytes Relative: 12.8 %
Neutro Abs: 2851 cells/uL (ref 1500–7800)
Neutrophils Relative %: 52.8 %
Platelets: 188 10*3/uL (ref 140–400)
RBC: 5.13 10*6/uL (ref 4.20–5.80)
RDW: 13 % (ref 11.0–15.0)
Total Lymphocyte: 28.9 %
WBC: 5.4 10*3/uL (ref 3.8–10.8)
WBCMIX: 691 {cells}/uL (ref 200–950)

## 2017-12-09 LAB — HEMOGLOBIN A1C
Hgb A1c MFr Bld: 5.6 % of total Hgb (ref <5.7)
Mean Plasma Glucose: 114 (calc)
eAG (mmol/L): 6.3 (calc)

## 2017-12-09 LAB — PSA, TOTAL AND FREE
PSA, % Free: 33 % (calc) (ref >25)
PSA, Free: 0.2 ng/mL
PSA, Total: 0.6 ng/mL (ref <=4.0)

## 2017-12-10 ENCOUNTER — Encounter: Payer: Self-pay | Admitting: Family Medicine

## 2017-12-10 NOTE — Telephone Encounter (Signed)
Please change referral to [facility] dermatology.  I had written agreed for dermatology on the referral.  Please update and if it is not possible if there is a reason why he was sent to Dr. Tarri Glenn, please call, explain and let him know.  Thank you

## 2017-12-15 ENCOUNTER — Encounter: Payer: Self-pay | Admitting: Gastroenterology

## 2017-12-23 ENCOUNTER — Encounter (HOSPITAL_COMMUNITY)
Admission: RE | Admit: 2017-12-23 | Discharge: 2017-12-23 | Disposition: A | Discharge status: Home / Self Care | Payer: Medicare HMO | Source: Ambulatory Visit | Attending: Ophthalmology | Admitting: Ophthalmology

## 2017-12-23 ENCOUNTER — Encounter (HOSPITAL_COMMUNITY): Payer: Self-pay

## 2017-12-29 ENCOUNTER — Encounter (HOSPITAL_COMMUNITY)
Admission: RE | Disposition: A | Discharge status: Home / Self Care | Payer: Self-pay | Source: Ambulatory Visit | Attending: Ophthalmology

## 2017-12-29 ENCOUNTER — Encounter (HOSPITAL_COMMUNITY): Payer: Self-pay

## 2017-12-29 ENCOUNTER — Ambulatory Visit (HOSPITAL_COMMUNITY): Payer: Medicare HMO | Admitting: Anesthesiology

## 2017-12-29 ENCOUNTER — Ambulatory Visit (HOSPITAL_COMMUNITY)
Admission: RE | Admit: 2017-12-29 | Discharge: 2017-12-29 | Disposition: A | Discharge status: Home / Self Care | Payer: Medicare HMO | Source: Ambulatory Visit | Attending: Ophthalmology | Admitting: Ophthalmology

## 2017-12-29 DIAGNOSIS — K219 Gastro-esophageal reflux disease without esophagitis: Secondary | ICD-10-CM | POA: Insufficient documentation

## 2017-12-29 DIAGNOSIS — H2512 Age-related nuclear cataract, left eye: Secondary | ICD-10-CM | POA: Diagnosis not present

## 2017-12-29 DIAGNOSIS — J45909 Unspecified asthma, uncomplicated: Secondary | ICD-10-CM | POA: Diagnosis not present

## 2017-12-29 DIAGNOSIS — Z79899 Other long term (current) drug therapy: Secondary | ICD-10-CM | POA: Insufficient documentation

## 2017-12-29 DIAGNOSIS — I739 Peripheral vascular disease, unspecified: Secondary | ICD-10-CM | POA: Insufficient documentation

## 2017-12-29 DIAGNOSIS — I10 Essential (primary) hypertension: Secondary | ICD-10-CM | POA: Diagnosis not present

## 2017-12-29 DIAGNOSIS — Z87891 Personal history of nicotine dependence: Secondary | ICD-10-CM | POA: Insufficient documentation

## 2017-12-29 DIAGNOSIS — Z7951 Long term (current) use of inhaled steroids: Secondary | ICD-10-CM | POA: Insufficient documentation

## 2017-12-29 HISTORY — PX: CATARACT EXTRACTION W/PHACO: SHX586

## 2017-12-29 SURGERY — PHACOEMULSIFICATION, CATARACT, WITH IOL INSERTION
Anesthesia: Monitor Anesthesia Care | Site: Eye | Laterality: Left

## 2017-12-29 MED ORDER — BSS IO SOLN
INTRAOCULAR | Status: DC | PRN
  Administered 2017-12-29: 15 mL via INTRAOCULAR

## 2017-12-29 MED ORDER — LACTATED RINGERS IV SOLN
INTRAVENOUS | Status: DC
  Administered 2017-12-29: 08:00:00 via INTRAVENOUS

## 2017-12-29 MED ORDER — TETRACAINE HCL 0.5 % OP SOLN
1.0000 [drp] | OPHTHALMIC | Status: AC | PRN
  Administered 2017-12-29 (×3): 1 [drp] via OPHTHALMIC

## 2017-12-29 MED ORDER — PHENYLEPHRINE HCL 2.5 % OP SOLN
1.0000 [drp] | OPHTHALMIC | Status: AC
  Administered 2017-12-29 (×3): 1 [drp] via OPHTHALMIC

## 2017-12-29 MED ORDER — LIDOCAINE HCL 3.5 % OP GEL
1.0000 "application " | Freq: Once | OPHTHALMIC | Status: AC
  Administered 2017-12-29: 1 via OPHTHALMIC

## 2017-12-29 MED ORDER — POVIDONE-IODINE 5 % OP SOLN
OPHTHALMIC | Status: DC | PRN
  Administered 2017-12-29: 1 via OPHTHALMIC

## 2017-12-29 MED ORDER — NEOMYCIN-POLYMYXIN-DEXAMETH 3.5-10000-0.1 OP SUSP
OPHTHALMIC | Status: DC | PRN
  Administered 2017-12-29: 2 [drp] via OPHTHALMIC

## 2017-12-29 MED ORDER — LIDOCAINE HCL (PF) 1 % IJ SOLN
INTRAMUSCULAR | Status: DC | PRN
  Administered 2017-12-29: .4 mL

## 2017-12-29 MED ORDER — EPINEPHRINE PF 1 MG/ML IJ SOLN
INTRAMUSCULAR | Status: AC
  Filled 2017-12-29: qty 1

## 2017-12-29 MED ORDER — MIDAZOLAM HCL 5 MG/5ML IJ SOLN
INTRAMUSCULAR | Status: DC | PRN
  Administered 2017-12-29 (×2): 1 mg via INTRAVENOUS

## 2017-12-29 MED ORDER — CYCLOPENTOLATE-PHENYLEPHRINE 0.2-1 % OP SOLN
1.0000 [drp] | OPHTHALMIC | Status: AC
  Administered 2017-12-29 (×3): 1 [drp] via OPHTHALMIC

## 2017-12-29 MED ORDER — MIDAZOLAM HCL 2 MG/2ML IJ SOLN
INTRAMUSCULAR | Status: AC
  Filled 2017-12-29: qty 2

## 2017-12-29 MED ORDER — PROVISC 10 MG/ML IO SOLN
INTRAOCULAR | Status: DC | PRN
  Administered 2017-12-29: 0.85 mL via INTRAOCULAR

## 2017-12-29 MED ORDER — EPINEPHRINE PF 1 MG/ML IJ SOLN
INTRAOCULAR | Status: DC | PRN
  Administered 2017-12-29: 500 mL

## 2017-12-29 SURGICAL SUPPLY — 12 items
CLOTH BEACON ORANGE TIMEOUT ST (SAFETY) ×1 IMPLANT
EYE SHIELD UNIVERSAL CLEAR (GAUZE/BANDAGES/DRESSINGS) ×1 IMPLANT
GLOVE BIOGEL PI IND STRL 7.0 (GLOVE) IMPLANT
GLOVE BIOGEL PI INDICATOR 7.0 (GLOVE) ×2
LENS ALC ACRYL/TECN (Ophthalmic Related) ×1 IMPLANT
NDL HYPO 18GX1.5 BLUNT FILL (NEEDLE) IMPLANT
NEEDLE HYPO 18GX1.5 BLUNT FILL (NEEDLE) ×2 IMPLANT
PAD ARMBOARD 7.5X6 YLW CONV (MISCELLANEOUS) ×1 IMPLANT
SYRINGE LUER LOK 1CC (MISCELLANEOUS) ×1 IMPLANT
TAPE SURG TRANSPORE 1 IN (GAUZE/BANDAGES/DRESSINGS) IMPLANT
TAPE SURGICAL TRANSPORE 1 IN (GAUZE/BANDAGES/DRESSINGS) ×1
WATER STERILE IRR 250ML POUR (IV SOLUTION) ×1 IMPLANT

## 2017-12-29 NOTE — H&P (Signed)
I have reviewed the H&P, the patient was re-examined, and I have identified no interval changes in medical condition and plan of care since the history and physical of record

## 2017-12-29 NOTE — Anesthesia Postprocedure Evaluation (Signed)
Anesthesia Post Note  Patient: Chad Lamb  Procedure(s) Performed: CATARACT EXTRACTION PHACO AND INTRAOCULAR LENS PLACEMENT ([place]) (Left Eye)  Patient location during evaluation: Short Stay Anesthesia Type: MAC Level of consciousness: awake and alert and oriented Pain management: pain level controlled Vital Signs Assessment: post-procedure vital signs reviewed and stable Respiratory status: spontaneous breathing Cardiovascular status: blood pressure returned to baseline Postop Assessment: no apparent nausea or vomiting Anesthetic complications: no     Last Vitals:  Vitals:   12/29/17 0721 12/29/17 0801  BP: (!) 155/104 (!) 153/89  Pulse: (!) 55 (!) 56  Resp: 12 14  Temp: 36.9 C   SpO2: 96% 96%    Last Pain:  Vitals:   12/29/17 0721  TempSrc: Oral  PainSc: 0-No pain                 [employer]

## 2017-12-29 NOTE — Discharge Instructions (Signed)
PATIENT INSTRUCTIONS POST-ANESTHESIA  IMMEDIATELY FOLLOWING SURGERY:  Do not drive or operate machinery for the first twenty four hours after surgery.  Do not make any important decisions for twenty four hours after surgery or while taking narcotic pain medications or sedatives.  If you develop intractable nausea and vomiting or a severe headache please notify your doctor immediately.  FOLLOW-UP:  Please make an appointment with your surgeon as instructed. You do not need to follow up with anesthesia unless specifically instructed to do so.  WOUND CARE INSTRUCTIONS (if applicable):  Keep a dry clean dressing on the anesthesia/puncture wound site if there is drainage.  Once the wound has quit draining you may leave it open to air.  Generally you should leave the bandage intact for twenty four hours unless there is drainage.  If the epidural site drains for more than 36-48 hours please call the anesthesia department.  QUESTIONS?:  Please feel free to call your physician or the hospital operator if you have any questions, and they will be happy to assist you.

## 2017-12-29 NOTE — Transfer of Care (Signed)
Immediate Anesthesia Transfer of Care Note  Patient: Chad Lamb  Procedure(s) Performed: CATARACT EXTRACTION PHACO AND INTRAOCULAR LENS PLACEMENT ([facility]) (Left Eye)  Patient Location: Short Stay  Anesthesia Type:MAC  Level of Consciousness: awake  Airway & Oxygen Therapy: Patient Spontanous Breathing  Post-op Assessment: Report given to RN  Post vital signs: Reviewed  Last Vitals:  Vitals Value Taken Time  BP    Temp    Pulse    Resp    SpO2      Last Pain:  Vitals:   12/29/17 0721  TempSrc: Oral  PainSc: 0-No pain      Patients Stated Pain Goal: 7 (16/10/96 0454)  Complications: No apparent anesthesia complications

## 2017-12-29 NOTE — Anesthesia Preprocedure Evaluation (Signed)
Anesthesia Evaluation  Patient identified by MRN, date of birth, ID band Patient awake    Reviewed: Allergy & Precautions, NPO status , Patient's Chart, lab work & pertinent test results  Airway Mallampati: II  TM Distance: >3 FB Neck ROM: Full    Dental no notable dental hx. (+) Teeth Intact   Pulmonary neg pulmonary ROS, asthma , pneumonia, resolved, former smoker,  flovent qd Alb only when swimming , but states doesn't help , so he doesn't use it   Pulmonary exam normal breath sounds clear to auscultation       Cardiovascular Exercise Tolerance: Good hypertension, + Peripheral Vascular Disease  negative cardio ROS Normal cardiovascular examI Rhythm:Regular Rate:Normal     Neuro/Psych Anxiety negative neurological ROS  negative psych ROS   GI/Hepatic negative GI ROS, Neg liver ROS, GERD  Medicated and Controlled,  Endo/Other  negative endocrine ROS  Renal/GU negative Renal ROS  negative genitourinary   Musculoskeletal negative musculoskeletal ROS (+) Arthritis , Osteoarthritis,    Abdominal   Peds negative pediatric ROS (+)  Hematology negative hematology ROS (+)   Anesthesia Other Findings   Reproductive/Obstetrics negative OB ROS                             Anesthesia Physical  Anesthesia Plan  ASA: II  Anesthesia Plan: MAC   Post-op Pain Management:    Induction:   PONV Risk Score and Plan:   Airway Management Planned: Simple Face Mask and Nasal Cannula  Additional Equipment:   Intra-op Plan:   Post-operative Plan:   Informed Consent:   Dental advisory given  Plan Discussed with: CRNA  Anesthesia Plan Comments:         Anesthesia Quick Evaluation

## 2017-12-29 NOTE — Op Note (Signed)
Date of Admission: 12/29/2017  Date of Surgery: 12/29/2017  Pre-Op Dx: Cataract Left  Eye  Post-Op Dx: Senile Nuclear Cataract  Left  Eye,  Dx Code H25.12  Surgeon: [name], M.D.  Assistants: None  Anesthesia: Topical with MAC  Indications: Painless, progressive loss of vision with compromise of daily activities.  Surgery: Cataract Extraction with Intraocular lens Implant Left Eye  Discription: The patient had dilating drops and viscous lidocaine placed into the Left eye in the pre-op holding area. After transfer to the operating room, a time out was performed. The patient was then prepped and draped. Beginning with a 39m blade a paracentesis port was made at the surgeon's 2 o'clock position. The anterior chamber was then filled with 1% non-preserved lidocaine. This was followed by filling the anterior chamber with Provisc.  A 2.452mkeratome blade was used to make a clear corneal incision at the temporal limbus.  A bent cystatome needle was used to create a continuous tear capsulotomy. Hydrodissection was performed with balanced salt solution on a Fine canula. The lens nucleus was then removed using the phacoemulsification handpiece. Residual cortex was removed with the I&A handpiece. The anterior chamber and capsular bag were refilled with Provisc. A posterior chamber intraocular lens was placed into the capsular bag with it's injector. The implant was positioned with the Kuglan hook. The Provisc was then removed from the anterior chamber and capsular bag with the I&A handpiece. Stromal hydration of the main incision and paracentesis port was performed with BSS on a Fine canula. The wounds were tested for leak which was negative. The patient tolerated the procedure well. There were no operative complications. The patient was then transferred to the recovery room in stable condition.  Complications: None  Specimen: None  EBL: None  Prosthetic device: J&J Technis, PCB00, power 10.5, SN  [id number]

## 2017-12-30 ENCOUNTER — Encounter (HOSPITAL_COMMUNITY): Payer: Self-pay | Admitting: Ophthalmology

## 2018-02-03 DIAGNOSIS — Z85828 Personal history of other malignant neoplasm of skin: Secondary | ICD-10-CM | POA: Diagnosis not present

## 2018-02-03 DIAGNOSIS — L570 Actinic keratosis: Secondary | ICD-10-CM | POA: Diagnosis not present

## 2018-02-03 DIAGNOSIS — L0100 Impetigo, unspecified: Secondary | ICD-10-CM | POA: Diagnosis not present

## 2018-02-03 DIAGNOSIS — B351 Tinea unguium: Secondary | ICD-10-CM | POA: Diagnosis not present

## 2018-02-03 DIAGNOSIS — L03019 Cellulitis of unspecified finger: Secondary | ICD-10-CM | POA: Diagnosis not present

## 2018-03-05 ENCOUNTER — Ambulatory Visit: Payer: Medicare HMO | Admitting: Gastroenterology

## 2018-03-30 DIAGNOSIS — B351 Tinea unguium: Secondary | ICD-10-CM | POA: Diagnosis not present

## 2018-05-01 DIAGNOSIS — R69 Illness, unspecified: Secondary | ICD-10-CM | POA: Diagnosis not present

## 2018-05-01 DIAGNOSIS — J4520 Mild intermittent asthma, uncomplicated: Secondary | ICD-10-CM | POA: Diagnosis not present

## 2018-05-01 DIAGNOSIS — R030 Elevated blood-pressure reading, without diagnosis of hypertension: Secondary | ICD-10-CM | POA: Diagnosis not present

## 2018-05-01 DIAGNOSIS — Z86718 Personal history of other venous thrombosis and embolism: Secondary | ICD-10-CM | POA: Diagnosis not present

## 2018-05-01 DIAGNOSIS — Z23 Encounter for immunization: Secondary | ICD-10-CM | POA: Diagnosis not present

## 2018-05-20 ENCOUNTER — Ambulatory Visit: Payer: Medicare HMO | Admitting: Gastroenterology

## 2018-07-15 ENCOUNTER — Ambulatory Visit: Payer: Medicare HMO | Admitting: Gastroenterology

## 2018-07-31 DIAGNOSIS — J4520 Mild intermittent asthma, uncomplicated: Secondary | ICD-10-CM | POA: Diagnosis not present

## 2018-07-31 DIAGNOSIS — Z7901 Long term (current) use of anticoagulants: Secondary | ICD-10-CM | POA: Diagnosis not present

## 2018-07-31 DIAGNOSIS — R69 Illness, unspecified: Secondary | ICD-10-CM | POA: Diagnosis not present

## 2018-07-31 DIAGNOSIS — I10 Essential (primary) hypertension: Secondary | ICD-10-CM | POA: Diagnosis not present

## 2019-01-26 DIAGNOSIS — R69 Illness, unspecified: Secondary | ICD-10-CM | POA: Diagnosis not present

## 2019-04-07 ENCOUNTER — Ambulatory Visit: Payer: Medicare Other | Attending: Internal Medicine

## 2019-04-07 DIAGNOSIS — Z23 Encounter for immunization: Secondary | ICD-10-CM

## 2019-04-07 NOTE — Progress Notes (Signed)
Covid-19 Vaccination Clinic  Name:  Chad Lamb    MRN: [id number] DOB: September 22, 1945  04/07/2019  [name] was observed post Covid-19 immunization for 15 minutes without incidence. He was provided with Vaccine Information Sheet and instruction to access the V-Safe system.   [name] was instructed to call 911 with any severe reactions post vaccine: Marland Kitchen Difficulty breathing  . Swelling of your face and throat  . A fast heartbeat  . A bad rash all over your body  . Dizziness and weakness    Immunizations Administered    Name Date Dose VIS Date Route   Pfizer COVID-19 Vaccine 04/07/2019  3:14 PM 0.3 mL 02/26/2019 Intramuscular   Manufacturer: [place]   Lot: [id number]   [place]: [id number]

## 2019-04-26 ENCOUNTER — Ambulatory Visit: Payer: Medicare HMO | Attending: Internal Medicine

## 2019-04-26 DIAGNOSIS — Z23 Encounter for immunization: Secondary | ICD-10-CM

## 2019-04-26 NOTE — Progress Notes (Signed)
Covid-19 Vaccination Clinic  Name:  Chad Lamb    MRN: [id number] DOB: 1945-04-17  04/26/2019  Mr. Dumire was observed post Covid-19 immunization for 15 minutes without incidence. He was provided with Vaccine Information Sheet and instruction to access the V-Safe system.   Mr. Puleo was instructed to call 911 with any severe reactions post vaccine: Marland Kitchen Difficulty breathing  . Swelling of your face and throat  . A fast heartbeat  . A bad rash all over your body  . Dizziness and weakness    Immunizations Administered    Name Date Dose VIS Date Route   Pfizer COVID-19 Vaccine 04/26/2019  3:18 PM 0.3 mL 02/26/2019 Intramuscular   Manufacturer: [place]   Lot: [id number]   [place]: [id number]

## 2019-07-13 ENCOUNTER — Encounter: Payer: Self-pay | Admitting: Gastroenterology

## 2019-08-11 ENCOUNTER — Ambulatory Visit: Payer: Medicare HMO | Admitting: Gastroenterology

## 2019-08-31 ENCOUNTER — Other Ambulatory Visit: Payer: Self-pay

## 2019-08-31 ENCOUNTER — Encounter: Payer: Self-pay | Admitting: Gastroenterology

## 2019-08-31 ENCOUNTER — Ambulatory Visit (INDEPENDENT_AMBULATORY_CARE_PROVIDER_SITE_OTHER): Payer: Medicare HMO | Admitting: Gastroenterology

## 2019-08-31 DIAGNOSIS — Z8719 Personal history of other diseases of the digestive system: Secondary | ICD-10-CM | POA: Diagnosis not present

## 2019-08-31 NOTE — Patient Instructions (Signed)
We are arranging a colonoscopy in the future.  Please hold Xarelto for 48 hours prior to the procedure.  Further recommendations to follow!  It was a pleasure to see you today. I want to create trusting relationships with patients to provide genuine, compassionate, and quality care. I value your feedback. If you receive a survey regarding your visit,  I greatly appreciate you taking time to fill this out.   Annitta Needs, PhD, ANP-BC [facility]enter[facility]

## 2019-08-31 NOTE — Progress Notes (Signed)
Primary Care Physician:  [name], MD Referring Physician: [name], MD Primary Gastroenterologist:  Dr. Gala Romney   Chief Complaint  Patient presents with  . Colonoscopy    hemorrhoids    HPI:   Chad Lamb is a 74 y.o. male presenting today at the request of Dr. Mannie Stabile for colonoscopy. Last colonoscopy in 2015 per patient by Dr. Lindalou Hose. No prior polyps. No family history of colorectal cancer or polyps. History of prolapsing hemorrhoids. Uses lidocaine/hydrocortisone as needed. Little spots of blood at times. 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 Unsure why he had clot. Dr. Rosana Hoes at [facility] with Vascular Surgery has noted Xarelto could be held for brief times for procedures such as endoscopy.   Takes 1/4 pill of omeprazole a day and now taking 1/2 pill. No dysphagia. No abdominal pain, N/V. No changes in bowel habits. No unexplained weight loss or lack of appetite.      Past Medical History:  Diagnosis Date  . Allergy   . Anxiety   . Arthritis   . Asthma   . Cataract   . Clotting disorder ([place])   . DVT (deep venous thrombosis) ([place])    unknown etiology. [facility]  . GERD (gastroesophageal reflux disease)   . Hyperlipidemia   . Hypertension   . Pneumonia     Past Surgical History:  Procedure Laterality Date  . CATARACT EXTRACTION W/PHACO Right 11/24/2017   Procedure: CATARACT EXTRACTION PHACO AND INTRAOCULAR LENS PLACEMENT RIGHT EYE;  Surgeon: [name], MD;  Location: [facility];  Service: Ophthalmology;  Laterality: Right;  CDE: 8.61  . CATARACT EXTRACTION W/PHACO Left 12/29/2017   Procedure: CATARACT EXTRACTION PHACO AND INTRAOCULAR LENS PLACEMENT (IOC);  Surgeon: [name], MD;  Location: [facility];  Service: Ophthalmology;  Laterality: Left;  CDE: 7.21  . GANGLION CYST EXCISION    . HERNIA REPAIR     bilateral  . TONSILLECTOMY    . VASCULAR SURGERY      Current Outpatient Medications  Medication Sig Dispense Refill  .  albuterol (PROVENTIL HFA;VENTOLIN HFA) 108 (90 Base) MCG/ACT inhaler Inhale 1-2 puffs into the lungs every 6 (six) hours as needed for wheezing or shortness of breath.    . ALPRAZolam (XANAX) 0.5 MG tablet Take by mouth 2 (two) times daily as needed.     Marland Kitchen amLODipine (NORVASC) 5 MG tablet Take 5 mg by mouth daily.    . fluticasone (FLOVENT HFA) 110 MCG/ACT inhaler Inhale 2 puffs into the lungs daily. 1 Inhaler 6  . Lidocaine-Hydrocortisone Ace 3-0.5 % CREA Apply 1 application topically as directed. 30 Tube 11  . Lutein 40 MG CAPS Take by mouth daily.    . Multiple Vitamins-Minerals (CENTRUM ADULTS PO) Take 1 tablet by mouth daily.    Marland Kitchen ofloxacin (OCUFLOX) 0.3 % ophthalmic solution 1 drop 4 (four) times daily. 1 drop to left eye 4 x daily for 7 days after monthly procedure.    Marland Kitchen omeprazole (PRILOSEC OTC) 20 MG tablet Take 5 mg by mouth as directed.     . rivaroxaban (XARELTO) 20 MG TABS tablet Take 20 mg by mouth daily with supper.    . sildenafil (VIAGRA) 50 MG tablet Take 50 mg by mouth daily as needed for erectile dysfunction.      No current facility-administered medications for this visit.    Allergies as of 08/31/2019 - Review Complete 08/31/2019  Allergen Reaction Noted  . Cephalosporins Itching 11/19/2017  . Amoxapine  and related Itching and Rash 11/19/2017  . Amoxicillin Rash 06/05/2015  . Cephalexin Rash 06/05/2015  . Clindamycin Rash 06/05/2015  . Clindamycin/lincomycin Itching and Rash 11/19/2017  . Sulfamethoxazole Rash 06/05/2015  . Sulfamethoxazole Itching and Rash 11/19/2017  . Trimethoprim Rash 06/05/2015    Family History  Problem Relation Age of Onset  . Cancer Mother        pancreatic  . Arthritis Father   . Early death Father 49       Lung infiltrate  . Colon cancer Neg Hx   . Colon polyps Neg Hx     Social History   Socioeconomic History  . Marital status: Married    Spouse name: [name]  . Number of children: 0  . Years of education: 34  . Highest  education level: High school graduate  Occupational History  . Not on file  Tobacco Use  . Smoking status: Former Smoker    Years: 2.00    Types: Pipe    Quit date: 03/21/1966    Years since quitting: 53.4  . Smokeless tobacco: Never Used  Vaping Use  . Vaping Use: Never used  Substance and Sexual Activity  . Alcohol use: Yes    Alcohol/week: 1.0 standard drink    Types: 1 Cans of beer per week    Comment: 1 can of beer a week  . Drug use: Never  . Sexual activity: Not Currently  Other Topics Concern  . Not on file  Social History Narrative   ** Merged History Encounter **       Grew up in [place], [place] and [place].  Retired.  Worked in [employer]. 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    Social Determinants of Health   Financial Resource Strain:   . Difficulty of Paying Living Expenses:   Food Insecurity:   . Worried About Charity fundraiser in the Last Year:   . Arboriculturist in the Last Year:   Transportation Needs:   . Film/video editor (Medical):   Marland Kitchen Lack of Transportation (Non-Medical):   Physical Activity:   . Days of Exercise per Week:   . Minutes of Exercise per Session:   Stress:   . Feeling of Stress :   Social Connections:   . Frequency of Communication with Friends and Family:   . Frequency of Social Gatherings with Friends and Family:   . Attends Religious Services:   . Active Member of Clubs or Organizations:   . Attends Archivist Meetings:   Marland Kitchen Marital Status:   Intimate Partner Violence:   . Fear of Current or Ex-Partner:   . Emotionally Abused:   Marland Kitchen Physically Abused:   . Sexually Abused:     Review of Systems: Gen: Denies any fever, chills, fatigue, weight loss, lack of appetite.  CV: Denies chest pain, heart palpitations, peripheral edema, syncope.  Resp: Denies shortness of breath at rest or with exertion. Denies wheezing or cough.  GI: see HPI GU : Denies urinary burning,  urinary frequency, urinary hesitancy MS: Denies joint pain, muscle weakness, cramps, or limitation of movement.  Derm: Denies rash, itching, dry skin Psych: Denies depression, anxiety, memory loss, and confusion Heme: see HPI  Physical Exam: BP 140/81   Pulse 72   Temp 97.7 F (36.5 C) (Temporal)   Ht 5' 10" (1.778 m)   Wt 158 lb 6.4 oz (71.8 kg)   BMI 22.73 kg/m  General:   Alert and oriented. Pleasant and cooperative. Well-nourished and well-developed.  Head:  Normocephalic and atraumatic. Eyes:  Without icterus, sclera clear and conjunctiva pink.  Ears:  Normal auditory acuity. Mouth:  Mask in place Lungs:  Clear to auscultation bilaterally. No wheezes, rales, or rhonchi. No distress.  Heart:  S1, S2 present without murmurs appreciated.  Abdomen:  +BS, soft, non-tender and non-distended. No HSM noted. No guarding or rebound. No masses appreciated.  Rectal:  Deferred  Msk:  Symmetrical without gross deformities. Normal posture. Extremities:  Without edema. Neurologic:  Alert and  oriented x4;  grossly normal neurologically. Skin:  Intact without significant lesions or rashes. Psych:  Alert and cooperative. Normal mood and affect.  ASSESSMENT: Chad Lamb is a 74 y.o. male presenting today at the request of Dr. Mannie Stabile for updated colonoscopy. He reports last evaluation in 2015 by Dr. Lindalou Hose (reports unavailable), but he denies any personal prior history of polyps, family history of colorectal cancer or polyps. He does note intermittent very low-volume hematochezia, which is most likely benign anorectal source in setting of hemorrhoids historically. However, diagnostic colonoscopy recommended as it has been 6 years since last lower GI evaluation.   He is on Xarelto with history of DVT in left subclavian about 5 years ago.  Dr. Rosana Hoes at [facility] with Vascular Surgery has noted Xarelto could be held for brief times for procedures such as endoscopy. I personally reviewed this  and was documented in Care Everywhere.     PLAN:  Proceed with TCS with Dr. Gala Romney in near future: the risks, benefits, and alternatives have been discussed with the patient in detail. The patient states understanding and desires to proceed.  Hold Xarelto X 48 hours prior  Further recommendations to follow  Annitta Needs, PhD, ANP-BC [facility]

## 2019-09-06 NOTE — Progress Notes (Signed)
No pcp per patient

## 2019-09-17 ENCOUNTER — Telehealth: Payer: Self-pay | Admitting: Emergency Medicine

## 2019-09-17 ENCOUNTER — Telehealth: Payer: Self-pay | Admitting: *Deleted

## 2019-09-17 NOTE — Telephone Encounter (Signed)
Schedule TCS with RMR. Hold xarelto 48 hrs  LMOVM

## 2019-09-17 NOTE — Telephone Encounter (Signed)
Pt called back.  He is aware that Mindy was unavailable at the time.  He was made aware that she would call him back as soon as she can.  Pt voiced understanding.

## 2019-09-17 NOTE — Telephone Encounter (Signed)
Pt called and stated he is ready to schedule his tcs, notified him that the person who schedules those are with another pt and they will call him as soon as they can

## 2019-09-21 NOTE — Telephone Encounter (Signed)
LMOVM for pt

## 2019-09-23 NOTE — Telephone Encounter (Signed)
Called pt. He wants to wait until September to schedule procedure. Advised will call once I receive that schedule

## 2019-10-21 ENCOUNTER — Telehealth: Payer: Self-pay | Admitting: *Deleted

## 2019-10-21 NOTE — Telephone Encounter (Signed)
Called pt. He has been scheduled for TCS with Dr. Gala Romney on 9/15 at 1:00pm, arrival 12:00pm. Covid test scheduled for 9/14 at 11:05am. Patient aware of location. Also advised will mail prep instructions for miralax and this is OTC. confirmed mailing address.

## 2019-10-27 ENCOUNTER — Telehealth: Payer: Self-pay | Admitting: Internal Medicine

## 2019-10-27 NOTE — Telephone Encounter (Signed)
LMOVM for pt to call back

## 2019-10-27 NOTE — Telephone Encounter (Signed)
Pt's wife called to say that patient is scheduled a covid test on the same day he starts his prep. I told her the covid test was at 11 am on 9/14 and he doesn't start his prep until noon the same day. She disagreed with me and said he starts it the day before. I told her the day before he will start his clear liquid diet, but she doesn't think he can have the covid test on the 14th and asked if we could change it. Please advise [phone]

## 2019-10-27 NOTE — Telephone Encounter (Signed)
Called patient. His COVID test has been rescheduled to 9/13 at 2:00pm.

## 2019-11-23 ENCOUNTER — Telehealth: Payer: Self-pay | Admitting: Internal Medicine

## 2019-11-23 NOTE — Telephone Encounter (Signed)
Pt's wife called to cancel procedure with Dr Gala Romney on 12/01/2019. They have concerns about covid and would rather wait until he has his booster shot and wanted to rescheduled sometime after the holidays. [phone]

## 2019-11-23 NOTE — Telephone Encounter (Signed)
Patient called back and is aware procedure has been cancelled

## 2019-11-23 NOTE — Telephone Encounter (Signed)
LMOVM for pt to return call

## 2019-11-23 NOTE — Telephone Encounter (Signed)
Also called endo and cancelled procedure for now.

## 2019-11-29 ENCOUNTER — Other Ambulatory Visit (HOSPITAL_COMMUNITY): Payer: Medicare HMO

## 2019-11-30 ENCOUNTER — Other Ambulatory Visit (HOSPITAL_COMMUNITY): Payer: Medicare HMO

## 2019-12-01 ENCOUNTER — Encounter (HOSPITAL_COMMUNITY): Payer: Self-pay

## 2019-12-01 ENCOUNTER — Ambulatory Visit (HOSPITAL_COMMUNITY): Admit: 2019-12-01 | Payer: Medicare HMO | Admitting: Internal Medicine

## 2019-12-01 SURGERY — COLONOSCOPY
Anesthesia: Moderate Sedation

## 2021-01-03 NOTE — Progress Notes (Signed)
[facility] [address] [place] [address]   CLINIC:  Medical Oncology/Hematology  Patient Care Team: Patient, No Pcp Per (Inactive) as PCP - General (General Practice) Chad Macadam, MD (Family Medicine) Chad Binder, MD (Inactive) as Consulting Physician (Gastroenterology) Chad Jack, MD as Medical Oncologist (Hematology)  CHIEF COMPLAINTS/PURPOSE OF CONSULTATION:  Evaluation of leukopenia and thrombocytopenia  HISTORY OF PRESENTING ILLNESS:  Chad Lamb 75 y.o. male is here because of evaluation of leukopenia and thrombocytopenia, at the request of Dr. Mannie Stabile.  Today he reports feeling good, and he is accompanied by his wife. He is currently taking Xarelto for a DVT in his left arm 5 years ago. He swims daily and reports good activity and energy levels. He reports trying a new over the counter memory medication within the last 6 months; he did not notice improvement while taking the medicine, and he stopped the medication prior to his recent lab work. He denies recent infections. He took antibiotics for 10 days following dental surgery on 11/16/20. He has a history of night sweats which have worsened over the past 2-3 months. He denies fevers and unintentional weight loss. He denies skin rash as well as history of lupus and RA. He has history of hemorrhoids which has previously caused low hemoglobin. He currently lives at home with his wife. 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 He denies extensive smoking history. He denies family history of anemia. His paternal aunt had breast cancer, and his mother had possible pancreatic cancer. He denies family history of lupus and RA. His sister had a miscarriage.   MEDICAL HISTORY:  Past Medical History:  Diagnosis Date   Allergy    Anxiety    Arthritis    Asthma    Cataract    Clotting disorder ([place])    DVT (deep venous thrombosis) ([facility])     unknown etiology. [place]   GERD (gastroesophageal reflux disease)    Hyperlipidemia    Hypertension    Pneumonia     SURGICAL HISTORY: Past Surgical History:  Procedure Laterality Date   CATARACT EXTRACTION W/PHACO Right 11/24/2017   Procedure: CATARACT EXTRACTION PHACO AND INTRAOCULAR LENS PLACEMENT RIGHT EYE;  Surgeon: [name], MD;  Location: [facility];  Service: Ophthalmology;  Laterality: Right;  CDE: 8.61   CATARACT EXTRACTION W/PHACO Left 12/29/2017   Procedure: CATARACT EXTRACTION PHACO AND INTRAOCULAR LENS PLACEMENT (IOC);  Surgeon: [name], MD;  Location: [facility] ORS;  Service: Ophthalmology;  Laterality: Left;  CDE: 7.21   GANGLION CYST EXCISION     HERNIA REPAIR     bilateral   TONSILLECTOMY     VASCULAR SURGERY      SOCIAL HISTORY: Social History   Socioeconomic History   Marital status: Married    Spouse name: [name]   Number of children: 0   Years of education: 13   Highest education level: High school graduate  Occupational History   Not on file  Tobacco Use   Smoking status: Former    Types: Pipe    Quit date: 03/21/1966    Years since quitting: 54.8   Smokeless tobacco: Never  Vaping Use   Vaping Use: Never used  Substance and Sexual Activity   Alcohol use: Yes    Alcohol/week: 1.0 standard drink    Types: 1 Cans of beer per week    Comment: 1 can of beer a week   Drug use:  Never   Sexual activity: Not Currently  Other Topics Concern   Not on file  Social History Narrative   ** Merged History Encounter **       Grew up in [place], [place] and [place].  Retired.  Worked in [employer]. 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    Social Determinants of Health   Financial Resource Strain: Not on file  Food Insecurity: Not on file  Transportation Needs: Not on file  Physical Activity: Not on file  Stress: Not on file  Social Connections: Not on file  Intimate Partner Violence: Not on file     FAMILY HISTORY: Family History  Problem Relation Age of Onset   Cancer Mother        pancreatic   Arthritis Father    Early death Father 51       Lung infiltrate   Colon cancer Neg Hx    Colon polyps Neg Hx     ALLERGIES:  is allergic to cephalosporins, amoxapine and related, amoxicillin, cephalexin, clindamycin, clindamycin/lincomycin, sulfamethoxazole, sulfamethoxazole, and trimethoprim.  MEDICATIONS:  Current Outpatient Medications  Medication Sig Dispense Refill   albuterol (PROVENTIL HFA;VENTOLIN HFA) 108 (90 Base) MCG/ACT inhaler Inhale 1-2 puffs into the lungs every 6 (six) hours as needed for wheezing or shortness of breath.     ALPRAZolam (XANAX) 0.5 MG tablet Take by mouth 2 (two) times daily as needed.      amLODipine (NORVASC) 2.5 MG tablet Take 2.5 mg by mouth daily.     fluticasone (FLOVENT HFA) 110 MCG/ACT inhaler Inhale 2 puffs into the lungs daily. 1 Inhaler 6   Lidocaine-Hydrocortisone Ace 3-0.5 % CREA Apply 1 application topically as directed. 30 Tube 11   Lutein 40 MG CAPS Take by mouth daily.     Multiple Vitamins-Minerals (CENTRUM ADULTS PO) Take 1 tablet by mouth daily.     ofloxacin (OCUFLOX) 0.3 % ophthalmic solution 1 drop 4 (four) times daily. 1 drop to left eye 4 x daily for 7 days after monthly procedure.     omeprazole (PRILOSEC OTC) 20 MG tablet Take 5 mg by mouth as directed.      rivaroxaban (XARELTO) 20 MG TABS tablet Take 20 mg by mouth daily with supper.     sildenafil (VIAGRA) 50 MG tablet Take 50 mg by mouth daily as needed for erectile dysfunction.      No current facility-administered medications for this visit.    REVIEW OF SYSTEMS:   Review of Systems  Constitutional:  Negative for appetite change, fatigue (60%), fever and unexpected weight change.  HENT:   Positive for trouble swallowing (chewing).   Endocrine: Positive for hot flashes (night sweats).  Skin:  Negative for rash.  Psychiatric/Behavioral:  Positive for depression.  The patient is nervous/anxious.   All other systems reviewed and are negative.   PHYSICAL EXAMINATION: ECOG PERFORMANCE STATUS: 1 - Symptomatic but completely ambulatory  Vitals:   01/05/21 1132  BP: 125/84  Pulse: 76  Resp: 16  Temp: 98.4 F (36.9 C)  SpO2: 98%   Filed Weights   01/05/21 1132  Weight: 156 lb 3.2 oz (70.9 kg)   Physical Exam Vitals reviewed.  Constitutional:      Appearance: Normal appearance.  Cardiovascular:     Rate and Rhythm: Normal rate and regular rhythm.     Pulses: Normal pulses.     Heart sounds: Normal heart sounds.  Pulmonary:     Effort: Pulmonary effort  is normal.     Breath sounds: Normal breath sounds.  Abdominal:     Palpations: Abdomen is soft. There is no hepatomegaly, splenomegaly or mass.     Tenderness: There is no abdominal tenderness.  Musculoskeletal:     Right lower leg: No edema.     Left lower leg: No edema.  Lymphadenopathy:     Cervical: No cervical adenopathy.     Right cervical: No superficial cervical adenopathy.    Left cervical: No superficial cervical adenopathy.     Upper Body:     Right upper body: No supraclavicular, axillary or pectoral adenopathy.     Left upper body: No supraclavicular, axillary or pectoral adenopathy.  Neurological:     General: No focal deficit present.     Mental Status: He is alert and oriented to person, place, and time.  Psychiatric:        Mood and Affect: Mood normal.        Behavior: Behavior normal.     LABORATORY DATA:  I have reviewed the data as listed No results found for this or any previous visit (from the past 2160 hour(s)).  RADIOGRAPHIC STUDIES: I have personally reviewed the radiological images as listed and agreed with the findings in the report. No results found.  ASSESSMENT:  Mild leukopenia and thrombocytopenia: - Seen at the request of Dr. Mannie Stabile for abnormal CBC. - Labs on 12/06/2020 shows white count 3.0 with 41% neutrophils, 41% lymphocytes, 13.8%  monocytes.  ANC was 1.2.  Hemoglobin 13.4 and normal.  MCV was slightly high at 96.  Platelet count was 128.  Vitamin C16 and folic acid were normal. - He took antibiotics for teeth implants few times this year, last 1 on 11/16/2020.  He does not know the antibiotic name.  He also reports taking over-the-counter pill for memory for 5 to 6 months, which was stopped around September 2022. - Labs from 07/07/2020 with creatinine 0.92.  White count 3.3 (37% neutrophils, 45% lymphocytes, 14% monocytes, 3% eosinophils), ANC 1.2.  Platelet count 149. - His prior CBC from 12/08/2017 in our system was completely normal. - Reports slight worsening of the night sweats in the last 3 months.  At least once per week and has to change clothes.  No fevers or weight loss.  Social/family history: - He lives at home with his wife.  He swims about 40 minutes daily. 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  Non-smoker. - Paternal aunt had breast cancer and mother had pancreatic cancer.    PLAN:  Leukopenia and thrombocytopenia: - We discussed the common etiologies of mild leukopenia and thrombocytopenia. - We will repeat CBC with differential today.  We will check for nutritional deficiencies.  We will also check for connective tissue disorders and infectious causes. - We will check LDH and serum testosterone level for night sweats. - If the above work-up is nondiagnostic, early MDS is a possibility. - RTC 1 to 2 weeks for follow-up.  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 - He underwent thrombolytic therapy at [facility]. - He followed up with vascular at [facility] and has been on Xarelto since then. - No bleeding issues reported.   All questions were answered. The patient knows to call the clinic with any problems, questions or concerns.  Chad Jack, MD 01/05/21 12:21 PM  [place] [phone]  I, Thana Ates, am acting as a Education administrator for Dr. Derek Lamb.  I, Chad Jack MD, have reviewed the above documentation for accuracy and completeness, and I agree with the above.

## 2021-01-05 ENCOUNTER — Inpatient Hospital Stay (HOSPITAL_COMMUNITY): Payer: Medicare HMO

## 2021-01-05 ENCOUNTER — Inpatient Hospital Stay (HOSPITAL_COMMUNITY): Payer: Medicare HMO | Attending: Hematology | Admitting: Hematology

## 2021-01-05 ENCOUNTER — Encounter (HOSPITAL_COMMUNITY): Payer: Self-pay | Admitting: Hematology

## 2021-01-05 ENCOUNTER — Other Ambulatory Visit: Payer: Self-pay

## 2021-01-05 DIAGNOSIS — D696 Thrombocytopenia, unspecified: Secondary | ICD-10-CM | POA: Insufficient documentation

## 2021-01-05 DIAGNOSIS — D709 Neutropenia, unspecified: Secondary | ICD-10-CM

## 2021-01-05 DIAGNOSIS — Z7901 Long term (current) use of anticoagulants: Secondary | ICD-10-CM | POA: Diagnosis not present

## 2021-01-05 DIAGNOSIS — Z86718 Personal history of other venous thrombosis and embolism: Secondary | ICD-10-CM

## 2021-01-05 DIAGNOSIS — D72819 Decreased white blood cell count, unspecified: Secondary | ICD-10-CM | POA: Diagnosis not present

## 2021-01-05 DIAGNOSIS — Z87891 Personal history of nicotine dependence: Secondary | ICD-10-CM | POA: Diagnosis not present

## 2021-01-05 LAB — CBC WITH DIFFERENTIAL/PLATELET
Abs Immature Granulocytes: 0.01 10*3/uL (ref 0.00–0.07)
Basophils Absolute: 0 10*3/uL (ref 0.0–0.1)
Basophils Relative: 0 %
Eosinophils Absolute: 0.1 10*3/uL (ref 0.0–0.5)
Eosinophils Relative: 3 %
HCT: 37.6 % — ABNORMAL LOW (ref 39.0–52.0)
Hemoglobin: 12.9 g/dL — ABNORMAL LOW (ref 13.0–17.0)
Immature Granulocytes: 0 %
Lymphocytes Relative: 39 %
Lymphs Abs: 1 10*3/uL (ref 0.7–4.0)
MCH: 34.1 pg — ABNORMAL HIGH (ref 26.0–34.0)
MCHC: 34.3 g/dL (ref 30.0–36.0)
MCV: 99.5 fL (ref 80.0–100.0)
Monocytes Absolute: 0.4 10*3/uL (ref 0.1–1.0)
Monocytes Relative: 15 %
Neutro Abs: 1.1 10*3/uL — ABNORMAL LOW (ref 1.7–7.7)
Neutrophils Relative %: 43 %
Platelets: 138 10*3/uL — ABNORMAL LOW (ref 150–400)
RBC: 3.78 MIL/uL — ABNORMAL LOW (ref 4.22–5.81)
RDW: 13.4 % (ref 11.5–15.5)
WBC: 2.6 10*3/uL — ABNORMAL LOW (ref 4.0–10.5)
nRBC: 0 % (ref 0.0–0.2)

## 2021-01-05 LAB — LACTATE DEHYDROGENASE: LDH: 142 U/L (ref 98–192)

## 2021-01-05 LAB — RETICULOCYTES
Immature Retic Fract: 5.4 % (ref 2.3–15.9)
RBC.: 3.77 MIL/uL — ABNORMAL LOW (ref 4.22–5.81)
Retic Count, Absolute: 31.7 10*3/uL (ref 19.0–186.0)
Retic Ct Pct: 0.8 % (ref 0.4–3.1)

## 2021-01-05 LAB — HEPATITIS B SURFACE ANTIGEN: Hepatitis B Surface Ag: NONREACTIVE

## 2021-01-05 LAB — HEPATITIS B CORE ANTIBODY, TOTAL: Hep B Core Total Ab: NONREACTIVE

## 2021-01-05 NOTE — Patient Instructions (Addendum)
[place] at [facility] Discharge Instructions  You were seen and examined today by Dr. Delton Coombes. Dr. Delton Coombes is a hematologist, meaning that he specializes in blood disorders. Dr. Delton Coombes discussed your past medical history, family history of blood disorders/cancers, and the events that led to you being here today.  You were referred to Dr. Delton Coombes due to decreased white blood cells and platelets. Dr. Delton Coombes has recommended additional lab work today in an attempt to identify the cause of your decreased white blood cells and platelets. This could very well be related to the antibiotics that you were on prior to the lab work.  Follow-up as scheduled.   Thank you for choosing [facility] at [facility] to provide your oncology and hematology care.  To afford each patient quality time with our provider, please arrive at least 15 minutes before your scheduled appointment time.   If you have a lab appointment with the [facility] please come in thru the Main Entrance and check in at the main information desk.  You need to re-schedule your appointment should you arrive 10 or more minutes late.  We strive to give you quality time with our providers, and arriving late affects you and other patients whose appointments are after yours.  Also, if you no show three or more times for appointments you may be dismissed from the clinic at the providers discretion.     Again, thank you for choosing [facility].  Our hope is that these requests will decrease the amount of time that you wait before being seen by our physicians.       _____________________________________________________________  Should you have questions after your visit to [facility], please contact our office at [phone] and follow the prompts.  Our office hours are 8:00 a.m. and 4:30 p.m. Monday - Friday.  Please note that voicemails left after 4:00  p.m. may not be returned until the following business day.  We are closed weekends and major holidays.  You do have access to a nurse 24-7, just call the main number to the clinic [phone] and do not press any options, hold on the line and a nurse will answer the phone.    For prescription refill requests, have your pharmacy contact our office and allow 72 hours.    Due to Covid, you will need to wear a mask upon entering the hospital. If you do not have a mask, a mask will be given to you at the Main Entrance upon arrival. For doctor visits, patients may have 1 support person age 32 or older with them. For treatment visits, patients can not have anyone with them due to social distancing guidelines and our immunocompromised population.

## 2021-01-06 LAB — RHEUMATOID FACTOR: Rheumatoid fact SerPl-aCnc: <10 IU/mL (ref <14.0)

## 2021-01-06 LAB — TESTOSTERONE: Testosterone: 541 ng/dL (ref 264–916)

## 2021-01-07 LAB — HEPATITIS C ANTIBODY: HCV Ab: NONREACTIVE

## 2021-01-07 LAB — COPPER, SERUM: Copper: 106 ug/dL (ref 69–132)

## 2021-01-07 LAB — HEPATITIS B SURFACE ANTIBODY,QUALITATIVE: Hep B S Ab: REACTIVE — AB

## 2021-01-08 LAB — PROTEIN ELECTROPHORESIS, SERUM
A/G Ratio: 1.1 (ref 0.7–1.7)
Albumin ELP: 3.3 g/dL (ref 2.9–4.4)
Alpha-1-Globulin: 0.3 g/dL (ref 0.0–0.4)
Alpha-2-Globulin: 0.8 g/dL (ref 0.4–1.0)
Beta Globulin: 1 g/dL (ref 0.7–1.3)
Gamma Globulin: 1 g/dL (ref 0.4–1.8)
Globulin, Total: 3 g/dL (ref 2.2–3.9)
Total Protein ELP: 6.3 g/dL (ref 6.0–8.5)

## 2021-01-08 LAB — PATHOLOGIST SMEAR REVIEW

## 2021-01-08 LAB — ANTINUCLEAR ANTIBODIES, IFA: ANA Ab, IFA: NEGATIVE

## 2021-01-09 LAB — METHYLMALONIC ACID, SERUM: Methylmalonic Acid, Quantitative: 181 nmol/L (ref 0–378)

## 2021-01-17 NOTE — Progress Notes (Signed)
[place], [place] [address]   CLINIC:  Medical Oncology/Hematology  PCP:  Patient, No Pcp Per (Inactive) None  None  REASON FOR VISIT:  Follow-up for leukopenia and thrombocytopenia  PRIOR THERAPY: none  CURRENT THERAPY: Xarelto  INTERVAL HISTORY:  Chad Lamb, a 75 y.o. male, returns for routine follow-up for his leukopenia and thrombocytopenia. Tramain was last seen on 01/05/2021.  Today he reports feeling good. He denies recent infections. He reports sinus drainage and a cough which he attributes to seasonal allergies. He continues to have night sweats.    REVIEW OF SYSTEMS:  Review of Systems  Constitutional:  Negative for appetite change and fatigue.  HENT:   Positive for trouble swallowing (due to teeth).   Respiratory:  Positive for cough.   Psychiatric/Behavioral:  Positive for depression.   All other systems reviewed and are negative.  PAST MEDICAL/SURGICAL HISTORY:  Past Medical History:  Diagnosis Date   Allergy    Anxiety    Arthritis    Asthma    Cataract    Clotting disorder ([place])    DVT (deep venous thrombosis) (HCC)    unknown etiology. [place]   GERD (gastroesophageal reflux disease)    Hyperlipidemia    Hypertension    Pneumonia    Past Surgical History:  Procedure Laterality Date   CATARACT EXTRACTION W/PHACO Right 11/24/2017   Procedure: CATARACT EXTRACTION PHACO AND INTRAOCULAR LENS PLACEMENT RIGHT EYE;  Surgeon: [name], MD;  Location: [facility];  Service: Ophthalmology;  Laterality: Right;  CDE: 8.61   CATARACT EXTRACTION W/PHACO Left 12/29/2017   Procedure: CATARACT EXTRACTION PHACO AND INTRAOCULAR LENS PLACEMENT (IOC);  Surgeon: [name], MD;  Location: [facility];  Service: Ophthalmology;  Laterality: Left;  CDE: 7.21   GANGLION CYST EXCISION     HERNIA REPAIR     bilateral   TONSILLECTOMY     VASCULAR SURGERY      SOCIAL HISTORY:  Social History   Socioeconomic History   Marital  status: Married    Spouse name: [name]   Number of children: 0   Years of education: 13   Highest education level: High school graduate  Occupational History   Not on file  Tobacco Use   Smoking status: Former    Types: Pipe    Quit date: 03/21/1966    Years since quitting: 54.8   Smokeless tobacco: Never  Vaping Use   Vaping Use: Never used  Substance and Sexual Activity   Alcohol use: Yes    Alcohol/week: 1.0 standard drink    Types: 1 Cans of beer per week    Comment: 1 can of beer a week   Drug use: Never   Sexual activity: Not Currently  Other Topics Concern   Not on file  Social History Narrative   ** Merged History Encounter **       Grew up in [place], [place] and [place].  Retired.  Worked in [employer]. 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    Social Determinants of Health   Financial Resource Strain: Not on file  Food Insecurity: Not on file  Transportation Needs: Not on file  Physical Activity: Not on file  Stress: Not on file  Social Connections: Not on file  Intimate Partner Violence: Not on file    FAMILY HISTORY:  Family History  Problem Relation Age of Onset   Cancer Mother  pancreatic   Arthritis Father    Early death Father 35       Lung infiltrate   Colon cancer Neg Hx    Colon polyps Neg Hx     CURRENT MEDICATIONS:  Current Outpatient Medications  Medication Sig Dispense Refill   albuterol (PROVENTIL HFA;VENTOLIN HFA) 108 (90 Base) MCG/ACT inhaler Inhale 1-2 puffs into the lungs every 6 (six) hours as needed for wheezing or shortness of breath.     ALPRAZolam (XANAX) 0.5 MG tablet Take by mouth 2 (two) times daily as needed.      amLODipine (NORVASC) 2.5 MG tablet Take 2.5 mg by mouth daily.     fluticasone (FLOVENT HFA) 110 MCG/ACT inhaler Inhale 2 puffs into the lungs daily. 1 Inhaler 6   Lidocaine-Hydrocortisone Ace 3-0.5 % CREA Apply 1 application topically as directed. 30 Tube 11    Lutein 40 MG CAPS Take by mouth daily.     Multiple Vitamins-Minerals (CENTRUM ADULTS PO) Take 1 tablet by mouth daily.     ofloxacin (OCUFLOX) 0.3 % ophthalmic solution 1 drop 4 (four) times daily. 1 drop to left eye 4 x daily for 7 days after monthly procedure.     omeprazole (PRILOSEC OTC) 20 MG tablet Take 5 mg by mouth as directed.      rivaroxaban (XARELTO) 20 MG TABS tablet Take 20 mg by mouth daily with supper.     sildenafil (VIAGRA) 50 MG tablet Take 50 mg by mouth daily as needed for erectile dysfunction.      No current facility-administered medications for this visit.    ALLERGIES:  Allergies  Allergen Reactions   Cephalosporins Itching   Amoxapine And Related Itching and Rash    Has patient had a PCN reaction causing immediate rash, facial/tongue/throat swelling, SOB or lightheadedness with hypotension: No Has patient had a PCN reaction causing severe rash involving mucus membranes or skin necrosis: No Has patient had a PCN reaction that required hospitalization: No Has patient had a PCN reaction occurring within the last 10 years: No If all of the above answers are "NO", then may proceed with Cephalosporin use.     Amoxicillin Rash   Cephalexin Rash   Clindamycin Rash   Clindamycin/Lincomycin Itching and Rash   Sulfamethoxazole Rash   Sulfamethoxazole Itching and Rash   Trimethoprim Rash    PHYSICAL EXAM:  Performance status (ECOG): 1 - Symptomatic but completely ambulatory  There were no vitals filed for this visit. Wt Readings from Last 3 Encounters:  01/05/21 156 lb 3.2 oz (70.9 kg)  08/31/19 158 lb 6.4 oz (71.8 kg)  12/08/17 162 lb 1.9 oz (73.5 kg)   Physical Exam Vitals reviewed.  Constitutional:      Appearance: Normal appearance.  Cardiovascular:     Rate and Rhythm: Normal rate and regular rhythm.     Pulses: Normal pulses.     Heart sounds: Normal heart sounds.  Pulmonary:     Effort: Pulmonary effort is normal.     Breath sounds: Normal  breath sounds.  Neurological:     General: No focal deficit present.     Mental Status: He is alert and oriented to person, place, and time.  Psychiatric:        Mood and Affect: Mood normal.        Behavior: Behavior normal.    LABORATORY DATA:  I have reviewed the labs as listed.  CBC Latest Ref Rng & Units 01/05/2021 12/08/2017 11/19/2017  WBC 4.0 - 10.5 K/uL  2.6(L) 5.4 5.5  Hemoglobin 13.0 - 17.0 g/dL 12.9(L) 15.3 14.9  Hematocrit 39.0 - 52.0 % 37.6(L) 44.6 44.0  Platelets 150 - 400 K/uL 138(L) 188 190   CMP Latest Ref Rng & Units 11/19/2017 02/12/2017 12/09/2006  Glucose 70 - 99 mg/dL 117(H) 103(H) -  BUN 8 - 23 mg/dL 12 15 -  Creatinine 0.61 - 1.24 mg/dL 0.95 0.98 0.93  Sodium 135 - 145 mmol/L 140 138 -  Potassium 3.5 - 5.1 mmol/L 4.0 3.9 -  Chloride 98 - 111 mmol/L 107 103 -  CO2 22 - 32 mmol/L 25 27 -  Calcium 8.9 - 10.3 mg/dL 9.3 9.4 -  Total Protein 6.1 - 8.1 g/dL - 6.8 -  Total Bilirubin 0.2 - 1.2 mg/dL - 2.2(H) -  AST 10 - 35 U/L - 23 -  ALT 9 - 46 U/L - 22 -      Component Value Date/Time   RBC 3.77 (L) 01/05/2021 1200   RBC 3.78 (L) 01/05/2021 1200   MCV 99.5 01/05/2021 1200   MCH 34.1 (H) 01/05/2021 1200   MCHC 34.3 01/05/2021 1200   RDW 13.4 01/05/2021 1200   LYMPHSABS 1.0 01/05/2021 1200   MONOABS 0.4 01/05/2021 1200   EOSABS 0.1 01/05/2021 1200   BASOSABS 0.0 01/05/2021 1200    DIAGNOSTIC IMAGING:  I have independently reviewed the scans and discussed with the patient. No results found.   ASSESSMENT:  Mild leukopenia and thrombocytopenia: - Seen at the request of Dr. Mannie Stabile for abnormal CBC. - Labs on 12/06/2020 shows white count 3.0 with 41% neutrophils, 41% lymphocytes, 13.8% monocytes.  ANC was 1.2.  Hemoglobin 13.4 and normal.  MCV was slightly high at 96.  Platelet count was 128.  Vitamin R60 and folic acid were normal. - He took antibiotics for teeth implants few times this year, last 1 on 11/16/2020.  He does not know the antibiotic name.  He also  reports taking over-the-counter pill for memory for 5 to 6 months, which was stopped around September 2022. - Labs from 07/07/2020 with creatinine 0.92.  White count 3.3 (37% neutrophils, 45% lymphocytes, 14% monocytes, 3% eosinophils), ANC 1.2.  Platelet count 149. - His prior CBC from 12/08/2017 in our system was completely normal. - Reports slight worsening of the night sweats in the last 3 months.  At least once per week and has to change clothes.  No fevers or weight loss.   Social/family history: - He lives at home with his wife.  He swims about 40 minutes daily. 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  Non-smoker. - Paternal aunt had breast cancer and mother had pancreatic cancer.  3.  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 - He underwent thrombolytic therapy at [facility]. - He will followed up with vascular at [facility] and has been on Xarelto since then.   PLAN:  Leukopenia and thrombocytopenia: - We have reviewed labs from 01/05/2021.  White count is low at 2.6 with ANC of 1.1.  Platelet count low at 138.  Hemoglobin also slightly low at 12.9.  MCV is 99.5. - SPEP is negative.  Methylmalonic acid and copper were normal.  LDH was normal.  Reticulocyte count was 0.8%.  ANA and rheumatoid factor was negative.  Hepatitis B and C work-up was also negative. - We discussed the most likely etiology of pancytopenia being myelodysplastic syndrome  and rarely other bone marrow infiltrative process. - We talked about the utility of bone marrow biopsy to confirm it.  He reports that he is completely asymptomatic. - He reported slight worsening of night sweats/hot flashes.  We have checked testosterone level which was 541. - Upon further discussion we have chosen to wait for at least next 3 months and repeat blood work.  If they get worse, will consider bone marrow aspiration and biopsy at  that time.   Unprovoked left subclavian DVT: - Continue Xarelto.  No bleeding issues.  Orders placed this encounter:  No orders of the defined types were placed in this encounter.    [name], MD [place] [phone]   I, Thana Ates, am acting as a scribe for Dr. [name].  I, [name] MD, have reviewed the above documentation for accuracy and completeness, and I agree with the above.

## 2021-01-18 ENCOUNTER — Other Ambulatory Visit: Payer: Self-pay

## 2021-01-18 ENCOUNTER — Inpatient Hospital Stay (HOSPITAL_COMMUNITY): Payer: Medicare HMO | Attending: Hematology | Admitting: Hematology

## 2021-01-18 VITALS — BP 124/76 | HR 78 | Temp 98.2°F | Resp 18 | Wt 153.4 lb

## 2021-01-18 DIAGNOSIS — D72819 Decreased white blood cell count, unspecified: Secondary | ICD-10-CM | POA: Diagnosis present

## 2021-01-18 DIAGNOSIS — D696 Thrombocytopenia, unspecified: Secondary | ICD-10-CM | POA: Insufficient documentation

## 2021-01-18 DIAGNOSIS — F32A Depression, unspecified: Secondary | ICD-10-CM | POA: Insufficient documentation

## 2021-01-18 DIAGNOSIS — Z881 Allergy status to other antibiotic agents status: Secondary | ICD-10-CM | POA: Diagnosis not present

## 2021-01-18 DIAGNOSIS — Z800 Family history of malignant neoplasm of digestive organs: Secondary | ICD-10-CM | POA: Diagnosis not present

## 2021-01-18 DIAGNOSIS — Z7901 Long term (current) use of anticoagulants: Secondary | ICD-10-CM | POA: Diagnosis not present

## 2021-01-18 DIAGNOSIS — Z8261 Family history of arthritis: Secondary | ICD-10-CM | POA: Diagnosis not present

## 2021-01-18 DIAGNOSIS — Z79899 Other long term (current) drug therapy: Secondary | ICD-10-CM | POA: Diagnosis not present

## 2021-01-18 DIAGNOSIS — Z882 Allergy status to sulfonamides status: Secondary | ICD-10-CM | POA: Insufficient documentation

## 2021-01-18 DIAGNOSIS — E785 Hyperlipidemia, unspecified: Secondary | ICD-10-CM | POA: Insufficient documentation

## 2021-01-18 DIAGNOSIS — Z803 Family history of malignant neoplasm of breast: Secondary | ICD-10-CM | POA: Insufficient documentation

## 2021-01-18 DIAGNOSIS — Z86718 Personal history of other venous thrombosis and embolism: Secondary | ICD-10-CM | POA: Insufficient documentation

## 2021-01-18 DIAGNOSIS — D709 Neutropenia, unspecified: Secondary | ICD-10-CM

## 2021-01-18 DIAGNOSIS — R059 Cough, unspecified: Secondary | ICD-10-CM | POA: Insufficient documentation

## 2021-01-18 DIAGNOSIS — Z880 Allergy status to penicillin: Secondary | ICD-10-CM | POA: Diagnosis not present

## 2021-01-18 DIAGNOSIS — R61 Generalized hyperhidrosis: Secondary | ICD-10-CM | POA: Insufficient documentation

## 2021-01-18 NOTE — Patient Instructions (Signed)
[place] at [facility] Discharge Instructions  You were seen and examined today by Dr. Delton Coombes. He reviewed your most recent labs and discussed possible bone marrow biopsy if the numbers continue to decline. 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 We will recheck labs in 3 months to see if the count continues to drop and discuss bone marrow biopsy further if needed at that time. Please keep follow up in 3 months.   Thank you for choosing [place] at [facility] to provide your oncology and hematology care.  To afford each patient quality time with our provider, please arrive at least 15 minutes before your scheduled appointment time.   If you have a lab appointment with the [facility] please come in thru the Main Entrance and check in at the main information desk.  You need to re-schedule your appointment should you arrive 10 or more minutes late.  We strive to give you quality time with our providers, and arriving late affects you and other patients whose appointments are after yours.  Also, if you no show three or more times for appointments you may be dismissed from the clinic at the providers discretion.     Again, thank you for choosing [facility].  Our hope is that these requests will decrease the amount of time that you wait before being seen by our physicians.       _____________________________________________________________  Should you have questions after your visit to [facility], please contact our office at [phone] and follow the prompts.  Our office hours are 8:00 a.m. and 4:30 p.m. Monday - Friday.  Please note that voicemails left after 4:00 p.m. may not be returned until the following business day.  We are closed weekends and major holidays.  You do have access to a nurse 24-7, just call the main number to the clinic [phone] and do  not press any options, hold on the line and a nurse will answer the phone.    For prescription refill requests, have your pharmacy contact our office and allow 72 hours.    Due to Covid, you will need to wear a mask upon entering the hospital. If you do not have a mask, a mask will be given to you at the Main Entrance upon arrival. For doctor visits, patients may have 1 support person age 40 or older with them. For treatment visits, patients can not have anyone with them due to social distancing guidelines and our immunocompromised population.

## 2021-01-25 ENCOUNTER — Other Ambulatory Visit (HOSPITAL_COMMUNITY): Payer: Self-pay | Admitting: Family Medicine

## 2021-01-25 ENCOUNTER — Other Ambulatory Visit: Payer: Self-pay

## 2021-01-25 ENCOUNTER — Ambulatory Visit (HOSPITAL_COMMUNITY)
Admission: RE | Admit: 2021-01-25 | Discharge: 2021-01-25 | Disposition: A | Discharge status: Home / Self Care | Payer: MEDICARE | Source: Ambulatory Visit | Attending: Family Medicine | Admitting: Family Medicine

## 2021-01-25 DIAGNOSIS — R059 Cough, unspecified: Secondary | ICD-10-CM | POA: Diagnosis present

## 2021-02-28 ENCOUNTER — Other Ambulatory Visit: Payer: Self-pay

## 2021-02-28 ENCOUNTER — Ambulatory Visit (INDEPENDENT_AMBULATORY_CARE_PROVIDER_SITE_OTHER): Payer: Medicare HMO | Admitting: Student

## 2021-02-28 ENCOUNTER — Encounter: Payer: Self-pay | Admitting: Student

## 2021-02-28 VITALS — BP 110/70 | HR 84 | Temp 98.3°F | Ht 70.0 in | Wt 152.6 lb

## 2021-02-28 DIAGNOSIS — J4530 Mild persistent asthma, uncomplicated: Secondary | ICD-10-CM | POA: Diagnosis not present

## 2021-02-28 DIAGNOSIS — R053 Chronic cough: Secondary | ICD-10-CM | POA: Diagnosis not present

## 2021-02-28 MED ORDER — FLUTICASONE PROPIONATE 50 MCG/ACT NA SUSP: 1.0000 | Freq: Every day | NASAL | 11 refills | Status: DC

## 2021-02-28 NOTE — Patient Instructions (Addendum)
-  flonase 1 spray each nare after clearing nose out following nasal irrigation with neti pot daily - claritin daily - breo 1 puff daily, rinse mouth after use - bring copy of CT Chest from [place] - call medical records there to request CD

## 2021-02-28 NOTE — Progress Notes (Signed)
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 Seasonal starts in September/October usually gone by Thanksgiving. Dry cough virtually always. Claritin improved his postnasal drainage though some persisted. Had been flovent for many years with gradually decreasing dose. Hasn't recently had any prednisone. Has been on prilosec for years - half controls asthma symptoms.   He did feel a bit better after z pack  Otherwise pertinent review of systems is negative.  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 Born in [place], lived in [place] for a while, then in [place] for last 50y. Smoked pipe in late high school college. No recent international travel. Cats at home - says he has cat allergy but they've installed home air filtration system.   Past Medical History:  Diagnosis Date   Allergy    Anxiety    Arthritis    Asthma    Cataract    Clotting disorder ([place])    DVT (deep venous thrombosis) (HCC)    unknown etiology. [facility]   GERD (gastroesophageal reflux disease)    Hyperlipidemia    Hypertension    Pneumonia      Family History  Problem Relation Age of Onset   Cancer Mother        pancreatic   Arthritis Father    Early death Father 24       Lung infiltrate   Colon cancer Neg Hx    Colon polyps Neg Hx      Past Surgical History:  Procedure Laterality Date   CATARACT EXTRACTION W/PHACO Right 11/24/2017   Procedure: CATARACT EXTRACTION PHACO AND INTRAOCULAR LENS PLACEMENT RIGHT EYE;  Surgeon: [name], MD;  Location: [facility];  Service: Ophthalmology;  Laterality: Right;  CDE: 8.61   CATARACT EXTRACTION W/PHACO Left  12/29/2017   Procedure: CATARACT EXTRACTION PHACO AND INTRAOCULAR LENS PLACEMENT (IOC);  Surgeon: [name], MD;  Location: [facility];  Service: Ophthalmology;  Laterality: Left;  CDE: 7.21   GANGLION CYST EXCISION     HERNIA REPAIR     bilateral   TONSILLECTOMY     VASCULAR SURGERY      Social History   Socioeconomic History   Marital status: Married    Spouse name: [name]   Number of children: 0   Years of education: 13   Highest education level: High school graduate  Occupational History   Not on file  Tobacco Use   Smoking status: Former    Types: Pipe    Quit date: 03/21/1966    Years since quitting: 54.9   Smokeless tobacco: Never  Vaping Use   Vaping Use: Never used  Substance and Sexual Activity   Alcohol use: Yes    Alcohol/week: 1.0 standard drink    Types: 1 Cans of beer per week    Comment: 1 can of beer a week   Drug use: Never   Sexual activity: Not Currently  Other Topics Concern   Not on file  Social History Narrative   ** Merged History Encounter **       Grew up in [place], [place] and [place].  Retired.  Worked in [place]. 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    Social Determinants of Health   Financial Resource Strain: Not on file  Food Insecurity: Not on file  Transportation Needs: Not on file  Physical Activity: Not on file  Stress: Not on file  Social Connections: Not on file  Intimate Partner Violence: Not on file     Allergies  Allergen Reactions   Cephalosporins Itching   Amoxapine And Related Itching and Rash    Has patient had a PCN reaction causing immediate rash, facial/tongue/throat swelling, SOB or lightheadedness with hypotension: No Has patient had a PCN reaction causing severe rash involving mucus membranes or skin necrosis: No Has patient had a PCN reaction that required hospitalization: No Has patient had a PCN reaction occurring within the last 10 years: No If all of the  above answers are "NO", then may proceed with Cephalosporin use.     Amoxicillin Rash   Cephalexin Rash   Clindamycin Rash   Clindamycin/Lincomycin Itching and Rash   Sulfamethoxazole Rash   Sulfamethoxazole Itching and Rash   Trimethoprim Rash     Outpatient Medications Prior to Visit  Medication Sig Dispense Refill   albuterol (PROVENTIL HFA;VENTOLIN HFA) 108 (90 Base) MCG/ACT inhaler Inhale 1-2 puffs into the lungs every 6 (six) hours as needed for wheezing or shortness of breath.     ALPRAZolam (XANAX) 0.5 MG tablet Take by mouth 2 (two) times daily as needed.      amLODipine (NORVASC) 2.5 MG tablet Take 2.5 mg by mouth daily.     fluticasone furoate-vilanterol (BREO ELLIPTA) 200-25 MCG/ACT AEPB Inhale 1 puff into the lungs daily.     Lidocaine-Hydrocortisone Ace 3-0.5 % CREA Apply 1 application topically as directed. 30 Tube 11   loratadine (CLARITIN) 10 MG tablet Take 10 mg by mouth daily.     Lutein 40 MG CAPS Take by mouth daily.     Multiple Vitamins-Minerals (CENTRUM ADULTS PO) Take 1 tablet by mouth daily.     ofloxacin (OCUFLOX) 0.3 % ophthalmic solution 1 drop 4 (four) times daily. 1 drop to left eye 4 x daily for 7 days after monthly procedure.     omeprazole (PRILOSEC OTC) 20 MG tablet 1/4-1/2 tablet 30 minutes before morning meal     rivaroxaban (XARELTO) 20 MG TABS tablet Take 20 mg by mouth daily with supper.     sildenafil (VIAGRA) 50 MG tablet Take 50 mg by mouth daily as needed for erectile dysfunction.      fluticasone (FLOVENT HFA) 110 MCG/ACT inhaler Inhale 2 puffs into the lungs daily. (Patient not taking: Reported on 02/28/2021) 1 Inhaler 6   omeprazole (PRILOSEC OTC) 20 MG tablet Take 5 mg by mouth as directed.  (Patient not taking: Reported on 02/28/2021)     No facility-administered medications prior to visit.       Objective:   Physical Exam:  General appearance: 75 y.o., male, NAD, conversant  Eyes: anicteric sclerae; PERRL, tracking  appropriately HENT: NCAT; MMM Neck: Trachea midline; no lymphadenopathy, no JVD Lungs: CTAB, no crackles, no wheeze, with normal respiratory effort CV: RRR, no murmur  Abdomen: Soft, non-tender; non-distended, BS present  Extremities: No peripheral edema, warm Skin: Normal turgor and texture; no rash Psych: Appropriate affect Neuro: Alert and oriented to person and place, no focal deficit     Vitals:   02/28/21 1536  BP: 110/70  Pulse: 84  Temp: 98.3 F (36.8 C)  TempSrc: Oral  SpO2: 98%  Weight: 152 lb 9.6 oz (69.2 kg)  Height: 5' 10" (1.778 m)   98% on RA BMI Readings from Last 3 Encounters:  02/28/21 21.90 kg/m  01/18/21 22.02 kg/m  01/05/21 22.41 kg/m   Wt Readings from Last 3 Encounters:  02/28/21 152 lb 9.6 oz (69.2 kg)  01/18/21 153 lb 7 oz (69.6 kg)  01/05/21 156 lb 3.2 oz (70.9 kg)     CBC    Component Value Date/Time   WBC 2.6 (L) 01/05/2021 1200   RBC 3.77 (L) 01/05/2021 1200   RBC 3.78 (L) 01/05/2021 1200   HGB 12.9 (L) 01/05/2021 1200   HCT 37.6 (L) 01/05/2021 1200   PLT 138 (L) 01/05/2021 1200   MCV 99.5 01/05/2021 1200   MCH 34.1 (H) 01/05/2021 1200   MCHC 34.3 01/05/2021 1200   RDW 13.4 01/05/2021 1200   LYMPHSABS 1.0 01/05/2021 1200   MONOABS 0.4 01/05/2021 1200   EOSABS 0.1 01/05/2021 1200   BASOSABS 0.0 01/05/2021 1200     Chest Imaging: CXR 01/25/21 reviewed by me unremarkable  Pulmonary Functions Testing Results: No flowsheet data found.     Assessment & Plan:   # Chronic cough # Reported history of persistent asthma With some improvement already since referral. Has barely started breo. Could still improve control of PND. GERD ok with his boutique use of prilosec.  Plan: - breo 1 puff once daily - start flonase 1 spray each nare after clearing out nose following neti pot irrigation - continue claritin - prilosec for GERD - bring disk of CT Chest from [facility] to next visit and we'll discuss whether it's worth pursuing  PFTs if he's feeling worse or if doing better then trialing time off of breo then performing PFTs to try to confirm diagnosis of asthma      [name], MD [facility] 02/28/2021 5:43 PM

## 2021-02-28 NOTE — Addendum Note (Signed)
Addended by: [name] on: 02/28/2021 05:49 PM   Modules accepted: Level of Service

## 2021-04-30 ENCOUNTER — Inpatient Hospital Stay (HOSPITAL_COMMUNITY): Payer: Medicare HMO | Attending: Hematology

## 2021-04-30 DIAGNOSIS — Z87891 Personal history of nicotine dependence: Secondary | ICD-10-CM | POA: Insufficient documentation

## 2021-04-30 DIAGNOSIS — R5383 Other fatigue: Secondary | ICD-10-CM | POA: Diagnosis not present

## 2021-04-30 DIAGNOSIS — D696 Thrombocytopenia, unspecified: Secondary | ICD-10-CM | POA: Insufficient documentation

## 2021-04-30 DIAGNOSIS — R61 Generalized hyperhidrosis: Secondary | ICD-10-CM | POA: Diagnosis not present

## 2021-04-30 DIAGNOSIS — D72819 Decreased white blood cell count, unspecified: Secondary | ICD-10-CM | POA: Diagnosis not present

## 2021-04-30 DIAGNOSIS — Z7901 Long term (current) use of anticoagulants: Secondary | ICD-10-CM | POA: Diagnosis not present

## 2021-04-30 DIAGNOSIS — Z79899 Other long term (current) drug therapy: Secondary | ICD-10-CM | POA: Diagnosis not present

## 2021-04-30 DIAGNOSIS — D709 Neutropenia, unspecified: Secondary | ICD-10-CM

## 2021-04-30 LAB — CBC WITH DIFFERENTIAL/PLATELET
Abs Immature Granulocytes: 0.01 10*3/uL (ref 0.00–0.07)
Basophils Absolute: 0 10*3/uL (ref 0.0–0.1)
Basophils Relative: 1 %
Eosinophils Absolute: 0.2 10*3/uL (ref 0.0–0.5)
Eosinophils Relative: 9 %
HCT: 29.6 % — ABNORMAL LOW (ref 39.0–52.0)
Hemoglobin: 9.8 g/dL — ABNORMAL LOW (ref 13.0–17.0)
Immature Granulocytes: 1 %
Lymphocytes Relative: 40 %
Lymphs Abs: 0.8 10*3/uL (ref 0.7–4.0)
MCH: 34.4 pg — ABNORMAL HIGH (ref 26.0–34.0)
MCHC: 33.1 g/dL (ref 30.0–36.0)
MCV: 103.9 fL — ABNORMAL HIGH (ref 80.0–100.0)
Monocytes Absolute: 0.1 10*3/uL (ref 0.1–1.0)
Monocytes Relative: 6 %
Neutro Abs: 0.8 10*3/uL — ABNORMAL LOW (ref 1.7–7.7)
Neutrophils Relative %: 43 %
Platelets: 78 10*3/uL — ABNORMAL LOW (ref 150–400)
RBC: 2.85 MIL/uL — ABNORMAL LOW (ref 4.22–5.81)
RDW: 15.1 % (ref 11.5–15.5)
WBC Morphology: REACTIVE
WBC: 1.9 10*3/uL — ABNORMAL LOW (ref 4.0–10.5)
nRBC: 0 % (ref 0.0–0.2)

## 2021-04-30 LAB — FOLATE: Folate: 24.9 ng/mL (ref >5.9)

## 2021-04-30 LAB — VITAMIN B12: Vitamin B-12: 459 pg/mL (ref 180–914)

## 2021-04-30 LAB — LACTATE DEHYDROGENASE: LDH: 159 U/L (ref 98–192)

## 2021-05-03 LAB — COPPER, SERUM: Copper: 142 ug/dL — ABNORMAL HIGH (ref 69–132)

## 2021-05-05 LAB — METHYLMALONIC ACID, SERUM: Methylmalonic Acid, Quantitative: 255 nmol/L (ref 0–378)

## 2021-05-07 ENCOUNTER — Encounter (HOSPITAL_COMMUNITY): Payer: Self-pay | Admitting: Hematology

## 2021-05-07 ENCOUNTER — Inpatient Hospital Stay (HOSPITAL_BASED_OUTPATIENT_CLINIC_OR_DEPARTMENT_OTHER): Payer: Medicare HMO | Admitting: Hematology

## 2021-05-07 ENCOUNTER — Other Ambulatory Visit: Payer: Self-pay

## 2021-05-07 VITALS — BP 120/77 | HR 98 | Temp 98.2°F | Resp 18 | Ht 70.0 in | Wt 147.2 lb

## 2021-05-07 DIAGNOSIS — D696 Thrombocytopenia, unspecified: Secondary | ICD-10-CM

## 2021-05-07 DIAGNOSIS — D709 Neutropenia, unspecified: Secondary | ICD-10-CM | POA: Diagnosis not present

## 2021-05-07 NOTE — Patient Instructions (Signed)
[place] at [facility] Discharge Instructions  You were seen and examined today by Dr. Delton Coombes. He reviewed your most recent labs and your platelets have decreased. Hold the Xarelto until we repeat your labs. Please keep follow up appointments as scheduled.   Thank you for choosing [place] at [facility] to provide your oncology and hematology care.  To afford each patient quality time with our provider, please arrive at least 15 minutes before your scheduled appointment time.   If you have a lab appointment with the [facility] please come in thru the Main Entrance and check in at the main information desk.  You need to re-schedule your appointment should you arrive 10 or more minutes late.  We strive to give you quality time with our providers, and arriving late affects you and other patients whose appointments are after yours.  Also, if you no show three or more times for appointments you may be dismissed from the clinic at the providers discretion.     Again, thank you for choosing [facility].  Our hope is that these requests will decrease the amount of time that you wait before being seen by our physicians.       _____________________________________________________________  Should you have questions after your visit to [facility], please contact our office at [phone] and follow the prompts.  Our office hours are 8:00 a.m. and 4:30 p.m. Monday - Friday.  Please note that voicemails left after 4:00 p.m. may not be returned until the following business day.  We are closed weekends and major holidays.  You do have access to a nurse 24-7, just call the main number to the clinic [phone] and do not press any options, hold on the line and a nurse will answer the phone.    For prescription refill requests, have your pharmacy contact our office and allow 72 hours.    Due to Covid, you will need to wear a  mask upon entering the hospital. If you do not have a mask, a mask will be given to you at the Main Entrance upon arrival. For doctor visits, patients may have 1 support person age 29 or older with them. For treatment visits, patients can not have anyone with them due to social distancing guidelines and our immunocompromised population.

## 2021-05-07 NOTE — Progress Notes (Signed)
Chad Lamb, Chad Lamb   CLINIC:  Medical Oncology/Hematology  PCP:  [name], MD [address] / [place] [address]  [phone]  REASON FOR VISIT:  Follow-up for leukopenia and thrombocytopenia  PRIOR THERAPY: none  CURRENT THERAPY: Xarelto  INTERVAL HISTORY:  Chad Lamb, a 76 y.o. male, returns for routine follow-up for his leukopenia and thrombocytopenia. Chad Lamb was last seen on 01/18/2021.  Today he reports feeling good. He reports intermittent night sweats occurring every 2-3 days. He has lost 6 lbs since his last visit. He continues to take Xarelto. He reports increased rectal bleeding form his hemorrhoids. He reports increased fatigue with exertion.   REVIEW OF SYSTEMS:  Review of Systems  Constitutional:  Positive for fatigue. Negative for appetite change.  Respiratory:  Positive for cough.   Gastrointestinal:  Positive for blood in stool (rectal - hemorrhoids).  Endocrine: Positive for hot flashes (night sweats).  Neurological:  Positive for numbness.  Hematological:  Bruises/bleeds easily.  All other systems reviewed and are negative.  PAST MEDICAL/SURGICAL HISTORY:  Past Medical History:  Diagnosis Date   Allergy    Anxiety    Arthritis    Asthma    Cataract    Clotting disorder ([place])    DVT (deep venous thrombosis) (H[facility])    unknown etiology. [place]   GERD (gastroesophageal reflux disease)    Hyperlipidemia    Hypertension    Pneumonia    Past Surgical History:  Procedure Laterality Date   CATARACT EXTRACTION W/PHACO Right 11/24/2017   Procedure: CATARACT EXTRACTION PHACO AND INTRAOCULAR LENS PLACEMENT RIGHT EYE;  Surgeon: [name], MD;  Location: [facility];  Service: Ophthalmology;  Laterality: Right;  CDE: 8.61   CATARACT EXTRACTION W/PHACO Left 12/29/2017   Procedure: CATARACT EXTRACTION PHACO AND INTRAOCULAR LENS PLACEMENT (IOC);  Surgeon: [name], MD;  Location: [facility];   Service: Ophthalmology;  Laterality: Left;  CDE: 7.21   GANGLION CYST EXCISION     HERNIA REPAIR     bilateral   TONSILLECTOMY     VASCULAR SURGERY      SOCIAL HISTORY:  Social History   Socioeconomic History   Marital status: Married    Spouse name: Chad Lamb   Number of children: 0   Years of education: 13   Highest education level: High school graduate  Occupational History   Not on file  Tobacco Use   Smoking status: Former    Types: Pipe    Quit date: 03/21/1966    Years since quitting: 55.1   Smokeless tobacco: Never  Vaping Use   Vaping Use: Never used  Substance and Sexual Activity   Alcohol use: Yes    Alcohol/week: 1.0 standard drink    Types: 1 Cans of beer per week    Comment: 1 can of beer a week   Drug use: Never   Sexual activity: Not Currently  Other Topics Concern   Not on file  Social History Narrative   ** Merged History Encounter **       Grew up in [place], [place] and [place].  Retired.  Worked in [place]. 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    Social Determinants of Health   Financial Resource Strain: Not on file  Food Insecurity: Not on file  Transportation Needs: Not on file  Physical Activity: Not on file  Stress: Not on file  Social Connections: Not  on file  Intimate Partner Violence: Not on file    FAMILY HISTORY:  Family History  Problem Relation Age of Onset   Cancer Mother        pancreatic   Arthritis Father    Early death Father 83       Lung infiltrate   Colon cancer Neg Hx    Colon polyps Neg Hx     CURRENT MEDICATIONS:  Current Outpatient Medications  Medication Sig Dispense Refill   albuterol (PROVENTIL HFA;VENTOLIN HFA) 108 (90 Base) MCG/ACT inhaler Inhale 1-2 puffs into the lungs every 6 (six) hours as needed for wheezing or shortness of breath.     ALPRAZolam (XANAX) 0.5 MG tablet Take by mouth 2 (two) times daily as needed.      amLODipine (NORVASC) 2.5 MG tablet  Take 2.5 mg by mouth daily.     fluticasone (FLONASE) 50 MCG/ACT nasal spray Place 1 spray into both nostrils daily. 16 g 11   fluticasone (FLOVENT HFA) 110 MCG/ACT inhaler Inhale 2 puffs into the lungs daily. 1 Inhaler 6   fluticasone furoate-vilanterol (BREO ELLIPTA) 200-25 MCG/ACT AEPB Inhale 1 puff into the lungs daily.     Lidocaine-Hydrocortisone Ace 3-0.5 % CREA Apply 1 application topically as directed. 30 Tube 11   loratadine (CLARITIN) 10 MG tablet Take 10 mg by mouth daily.     Lutein 40 MG CAPS Take by mouth daily.     Multiple Vitamins-Minerals (CENTRUM ADULTS PO) Take 1 tablet by mouth daily.     ofloxacin (OCUFLOX) 0.3 % ophthalmic solution 1 drop 4 (four) times daily. 1 drop to left eye 4 x daily for 7 days after monthly procedure.     omeprazole (PRILOSEC OTC) 20 MG tablet 1/4-1/2 tablet 30 minutes before morning meal     rivaroxaban (XARELTO) 20 MG TABS tablet Take 20 mg by mouth daily with supper.     sildenafil (VIAGRA) 50 MG tablet Take 50 mg by mouth daily as needed for erectile dysfunction.      omeprazole (PRILOSEC OTC) 20 MG tablet Take 5 mg by mouth as directed.  (Patient not taking: Reported on 02/28/2021)     No current facility-administered medications for this visit.    ALLERGIES:  Allergies  Allergen Reactions   Cephalosporins Itching   Amoxapine And Related Itching and Rash    Has patient had a PCN reaction causing immediate rash, facial/tongue/throat swelling, SOB or lightheadedness with hypotension: No Has patient had a PCN reaction causing severe rash involving mucus membranes or skin necrosis: No Has patient had a PCN reaction that required hospitalization: No Has patient had a PCN reaction occurring within the last 10 years: No If all of the above answers are "NO", then may proceed with Cephalosporin use.     Amoxicillin Rash   Cephalexin Rash   Clindamycin Rash   Clindamycin/Lincomycin Itching and Rash   Sulfamethoxazole Rash   Sulfamethoxazole  Itching and Rash   Trimethoprim Rash    PHYSICAL EXAM:  Performance status (ECOG): 1 - Symptomatic but completely ambulatory  Vitals:   05/07/21 1411  BP: 120/77  Pulse: 98  Resp: 18  Temp: 98.2 F (36.8 C)  SpO2: 99%   Wt Readings from Last 3 Encounters:  05/07/21 147 lb 4 oz (66.8 kg)  02/28/21 152 lb 9.6 oz (69.2 kg)  01/18/21 153 lb 7 oz (69.6 kg)   Physical Exam Vitals reviewed.  Constitutional:      Appearance: Normal appearance.  Cardiovascular:  Rate and Rhythm: Normal rate and regular rhythm.     Pulses: Normal pulses.     Heart sounds: Normal heart sounds.  Pulmonary:     Effort: Pulmonary effort is normal.     Breath sounds: Normal breath sounds.  Neurological:     General: No focal deficit present.     Mental Status: He is alert and oriented to person, place, and time.  Psychiatric:        Mood and Affect: Mood normal.        Behavior: Behavior normal.    LABORATORY DATA:  I have reviewed the labs as listed.  CBC Latest Ref Rng & Units 04/30/2021 01/05/2021 12/08/2017  WBC 4.0 - 10.5 K/uL 1.9(L) 2.6(L) 5.4  Hemoglobin 13.0 - 17.0 g/dL 9.8(L) 12.9(L) 15.3  Hematocrit 39.0 - 52.0 % 29.6(L) 37.6(L) 44.6  Platelets 150 - 400 K/uL 78(L) 138(L) 188   CMP Latest Ref Rng & Units 11/19/2017 02/12/2017 12/09/2006  Glucose 70 - 99 mg/dL 117(H) 103(H) -  BUN 8 - 23 mg/dL 12 15 -  Creatinine 0.61 - 1.24 mg/dL 0.95 0.98 0.93  Sodium 135 - 145 mmol/L 140 138 -  Potassium 3.5 - 5.1 mmol/L 4.0 3.9 -  Chloride 98 - 111 mmol/L 107 103 -  CO2 22 - 32 mmol/L 25 27 -  Calcium 8.9 - 10.3 mg/dL 9.3 9.4 -  Total Protein 6.1 - 8.1 g/dL - 6.8 -  Total Bilirubin 0.2 - 1.2 mg/dL - 2.2(H) -  AST 10 - 35 U/L - 23 -  ALT 9 - 46 U/L - 22 -      Component Value Date/Time   RBC 2.85 (L) 04/30/2021 1404   MCV 103.9 (H) 04/30/2021 1404   MCH 34.4 (H) 04/30/2021 1404   MCHC 33.1 04/30/2021 1404   RDW 15.1 04/30/2021 1404   LYMPHSABS 0.8 04/30/2021 1404   MONOABS 0.1  04/30/2021 1404   EOSABS 0.2 04/30/2021 1404   BASOSABS 0.0 04/30/2021 1404    DIAGNOSTIC IMAGING:  I have independently reviewed the scans and discussed with the patient. No results found.   ASSESSMENT:  Mild leukopenia and thrombocytopenia: - Seen at the request of Dr. Mannie Stabile for abnormal CBC. - Labs on 12/06/2020 shows white count 3.0 with 41% neutrophils, 41% lymphocytes, 13.8% monocytes.  ANC was 1.2.  Hemoglobin 13.4 and normal.  MCV was slightly high at 96.  Platelet count was 128.  Vitamin V40 and folic acid were normal. - He took antibiotics for teeth implants few times this year, last 1 on 11/16/2020.  He does not know the antibiotic name.  He also reports taking over-the-counter pill for memory for 5 to 6 months, which was stopped around September 2022. - Labs from 07/07/2020 with creatinine 0.92.  White count 3.3 (37% neutrophils, 45% lymphocytes, 14% monocytes, 3% eosinophils), ANC 1.2.  Platelet count 149. - His prior CBC from 12/08/2017 in our system was completely normal. - Reports slight worsening of the night sweats in the last 3 months.  At least once per week and has to change clothes.  No fevers or weight loss.   Social/family history: - He lives at home with his wife.  He swims about 40 minutes daily. 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  Non-smoker. - Paternal aunt had breast cancer and mother had pancreatic cancer.  3.  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 - He underwent thrombolytic [facility] at [facility]. - He will followed up with vascular [facility] and has been on Xarelto since then.   PLAN:  Leukopenia and thrombocytopenia: - He reports having some dyspnea on exertion during pool exercises. - He is having night sweats every other day. - He also reported hemorrhoidal bleeding every day and occasionally significant. - Reviewed his labs from  04/30/2021.  White count is 1.9, down from 2.6.  Platelet count is 78, down from 138.  Hemoglobin down from 12.9-9.8.  Differential shows ANC of 800.  Immature granulocytes 1% seen.  LDH was normal.  K44, MMA, folic acid and copper were normal. - Differential diagnosis includes MDS and other bone marrow infiltrative process. - Recommend bone marrow biopsy with cytogenetics and FISH panel for MDS/AML depending on morphology. - RTC 2 weeks after biopsy.   Unprovoked left subclavian DVT: - He is taking Xarelto daily. - Because of his hemorrhoidal bleeding, I have told him to hold off on Xarelto at this time.  Orders placed this encounter:  No orders of the defined types were placed in this encounter.    [name], MD [place] [phone]   I, Thana Ates, am acting as a scribe for Dr. [name].  I, [name] MD, have reviewed the above documentation for accuracy and completeness, and I agree with the above.

## 2021-05-17 ENCOUNTER — Other Ambulatory Visit: Payer: Self-pay

## 2021-05-17 ENCOUNTER — Inpatient Hospital Stay (HOSPITAL_COMMUNITY): Payer: Medicare HMO | Admitting: Certified Registered"

## 2021-05-17 ENCOUNTER — Encounter (HOSPITAL_COMMUNITY)
Admission: RE | Disposition: A | Discharge status: Home / Self Care | Payer: Self-pay | Source: Home / Self Care | Attending: Ophthalmology

## 2021-05-17 ENCOUNTER — Encounter (HOSPITAL_COMMUNITY): Payer: Self-pay | Admitting: Ophthalmology

## 2021-05-17 ENCOUNTER — Ambulatory Visit (HOSPITAL_COMMUNITY)
Admission: RE | Admit: 2021-05-17 | Discharge: 2021-05-17 | Disposition: A | Discharge status: Home / Self Care | Payer: Medicare HMO | Attending: Ophthalmology | Admitting: Ophthalmology

## 2021-05-17 DIAGNOSIS — F419 Anxiety disorder, unspecified: Secondary | ICD-10-CM

## 2021-05-17 DIAGNOSIS — D759 Disease of blood and blood-forming organs, unspecified: Secondary | ICD-10-CM | POA: Insufficient documentation

## 2021-05-17 DIAGNOSIS — J449 Chronic obstructive pulmonary disease, unspecified: Secondary | ICD-10-CM | POA: Diagnosis not present

## 2021-05-17 DIAGNOSIS — Z87891 Personal history of nicotine dependence: Secondary | ICD-10-CM | POA: Insufficient documentation

## 2021-05-17 DIAGNOSIS — H44002 Unspecified purulent endophthalmitis, left eye: Secondary | ICD-10-CM | POA: Insufficient documentation

## 2021-05-17 DIAGNOSIS — I10 Essential (primary) hypertension: Secondary | ICD-10-CM | POA: Insufficient documentation

## 2021-05-17 DIAGNOSIS — Z86718 Personal history of other venous thrombosis and embolism: Secondary | ICD-10-CM | POA: Insufficient documentation

## 2021-05-17 DIAGNOSIS — Z20822 Contact with and (suspected) exposure to covid-19: Secondary | ICD-10-CM | POA: Diagnosis not present

## 2021-05-17 HISTORY — PX: PARS PLANA VITRECTOMY: SHX2166

## 2021-05-17 LAB — BASIC METABOLIC PANEL
Anion gap: 9 (ref 5–15)
BUN: 13 mg/dL (ref 8–23)
CO2: 25 mmol/L (ref 22–32)
Calcium: 9.3 mg/dL (ref 8.9–10.3)
Chloride: 106 mmol/L (ref 98–111)
Creatinine, Ser: 0.83 mg/dL (ref 0.61–1.24)
GFR, Estimated: >60 mL/min (ref >60)
Glucose, Bld: 92 mg/dL (ref 70–99)
Potassium: 3.6 mmol/L (ref 3.5–5.1)
Sodium: 140 mmol/L (ref 135–145)

## 2021-05-17 LAB — SARS CORONAVIRUS 2 BY RT PCR (HOSPITAL ORDER, PERFORMED IN ~~LOC~~ HOSPITAL LAB): SARS Coronavirus 2: NEGATIVE

## 2021-05-17 SURGERY — PARS PLANA VITRECTOMY WITH 25 GAUGE
Anesthesia: Monitor Anesthesia Care | Site: Eye | Laterality: Left

## 2021-05-17 MED ORDER — VANCOMYCIN SUBCONJUNCTIVAL INJECTION 25 MG/0.5 ML
25.0000 mg | INTRAOCULAR | Status: DC
  Filled 2021-05-17 (×4): qty 0.5

## 2021-05-17 MED ORDER — EPINEPHRINE PF 1 MG/ML IJ SOLN
INTRAMUSCULAR | Status: DC | PRN
  Administered 2021-05-17: .3 mL

## 2021-05-17 MED ORDER — AMIKACIN INTRAVITREAL INJECTION 0.4 MG/0.1 ML
INTRAOCULAR | Status: DC | PRN
  Administered 2021-05-17: 0.4 mg via INTRAVITREAL

## 2021-05-17 MED ORDER — PHENYLEPHRINE HCL 2.5 % OP SOLN
1.0000 [drp] | OPHTHALMIC | Status: AC | PRN
  Administered 2021-05-17 (×3): 1 [drp] via OPHTHALMIC

## 2021-05-17 MED ORDER — VANCOMYCIN INTRAVITREAL INJECTION 1 MG/0.1 ML
INTRAOCULAR | Status: DC | PRN
  Administered 2021-05-17: 1 mg via INTRAVITREAL

## 2021-05-17 MED ORDER — MIDAZOLAM HCL 2 MG/2ML IJ SOLN
INTRAMUSCULAR | Status: AC
  Filled 2021-05-17: qty 2

## 2021-05-17 MED ORDER — MIDAZOLAM HCL 2 MG/2ML IJ SOLN
INTRAMUSCULAR | Status: DC | PRN
  Administered 2021-05-17: 1 mg via INTRAVENOUS

## 2021-05-17 MED ORDER — STERILE WATER FOR IRRIGATION IR SOLN
Status: DC | PRN
  Administered 2021-05-17: 300 mL

## 2021-05-17 MED ORDER — FENTANYL CITRATE (PF) 250 MCG/5ML IJ SOLN
INTRAMUSCULAR | Status: AC
  Filled 2021-05-17: qty 5

## 2021-05-17 MED ORDER — LACTATED RINGERS IV SOLN: INTRAVENOUS | Status: DC

## 2021-05-17 MED ORDER — ACETAMINOPHEN 500 MG PO TABS
1000.0000 mg | ORAL_TABLET | Freq: Once | ORAL | Status: AC
  Administered 2021-05-17: 1000 mg via ORAL

## 2021-05-17 MED ORDER — GATIFLOXACIN 0.5 % OP SOLN
1.0000 [drp] | OPHTHALMIC | Status: AC | PRN
  Administered 2021-05-17 (×3): 1 [drp] via OPHTHALMIC

## 2021-05-17 MED ORDER — SODIUM HYALURONATE 10 MG/ML IO SOLUTION
PREFILLED_SYRINGE | INTRAOCULAR | Status: AC
  Filled 2021-05-17: qty 0.85

## 2021-05-17 MED ORDER — AMIKACIN INTRAVITREAL INJECTION 0.4 MG/0.1 ML: 0.4000 mg | INTRAOCULAR | Status: DC

## 2021-05-17 MED ORDER — SODIUM HYALURONATE 10 MG/ML IO SOLUTION
PREFILLED_SYRINGE | INTRAOCULAR | Status: DC | PRN
  Administered 2021-05-17: .85 mL via INTRAOCULAR

## 2021-05-17 MED ORDER — TROPICAMIDE 1 % OP SOLN
1.0000 [drp] | OPHTHALMIC | Status: AC | PRN
  Administered 2021-05-17 (×3): 1 [drp] via OPHTHALMIC

## 2021-05-17 MED ORDER — BSS PLUS IO SOLN
INTRAOCULAR | Status: DC | PRN
  Administered 2021-05-17: 1 via INTRAOCULAR

## 2021-05-17 MED ORDER — LIDOCAINE HCL 2 % IJ SOLN
INTRAMUSCULAR | Status: DC | PRN
  Administered 2021-05-17: 6 mL via RETROBULBAR

## 2021-05-17 MED ORDER — DEXAMETHASONE SODIUM PHOSPHATE 10 MG/ML IJ SOLN
INTRAMUSCULAR | Status: DC | PRN
  Administered 2021-05-17: 10 mg

## 2021-05-17 MED ORDER — CHLORHEXIDINE GLUCONATE 0.12 % MT SOLN
15.0000 mL | Freq: Once | OROMUCOSAL | Status: AC
  Administered 2021-05-17: 15 mL via OROMUCOSAL

## 2021-05-17 MED ORDER — AMIKACIN INTRAVITREAL INJECTION 0.4 MG/0.1 ML
0.4000 mg | INTRAOCULAR | Status: DC
  Filled 2021-05-17 (×4): qty 0.1

## 2021-05-17 MED ORDER — FENTANYL CITRATE (PF) 100 MCG/2ML IJ SOLN: 25.0000 ug | INTRAMUSCULAR | Status: DC | PRN

## 2021-05-17 MED ORDER — HYALURONIDASE HUMAN NICU 150 UNIT/ML INJECTION
INTRAMUSCULAR | Status: DC | PRN
  Administered 2021-05-17: 150 [IU]

## 2021-05-17 MED ORDER — BSS IO SOLN
INTRAOCULAR | Status: DC | PRN
  Administered 2021-05-17: 15 mL via INTRAOCULAR

## 2021-05-17 MED ORDER — 0.9 % SODIUM CHLORIDE (POUR BTL) OPTIME
TOPICAL | Status: DC | PRN
  Administered 2021-05-17: 300 mL

## 2021-05-17 MED ORDER — TOBRAMYCIN-DEXAMETHASONE 0.3-0.1 % OP OINT
TOPICAL_OINTMENT | OPHTHALMIC | Status: DC | PRN
  Administered 2021-05-17: 1 via OPHTHALMIC

## 2021-05-17 MED ORDER — VANCOMYCIN INTRAVITREAL INJECTION 1 MG/0.1 ML
1.0000 mg | INTRAOCULAR | Status: DC
  Filled 2021-05-17 (×2): qty 0.1

## 2021-05-17 MED ORDER — PROPARACAINE HCL 0.5 % OP SOLN
1.0000 [drp] | OPHTHALMIC | Status: AC | PRN
  Administered 2021-05-17 (×3): 1 [drp] via OPHTHALMIC

## 2021-05-17 MED ORDER — VANCOMYCIN SUBCONJUNCTIVAL INJECTION 25 MG/0.5 ML: 25.0000 mg | INTRAOCULAR | Status: DC

## 2021-05-17 MED ORDER — PROPOFOL 10 MG/ML IV BOLUS
INTRAVENOUS | Status: DC | PRN
  Administered 2021-05-17: 20 mg via INTRAVENOUS
  Administered 2021-05-17: 40 mg via INTRAVENOUS

## 2021-05-17 MED ORDER — ORAL CARE MOUTH RINSE: 15.0000 mL | Freq: Once | OROMUCOSAL | Status: AC

## 2021-05-17 SURGICAL SUPPLY — 65 items
APPLICATOR COTTON TIP 6 STRL (MISCELLANEOUS) ×1 IMPLANT
APPLICATOR COTTON TIP 6IN STRL (MISCELLANEOUS) ×2 IMPLANT
BAND WRIST GAS GREEN (MISCELLANEOUS) IMPLANT
BLADE MVR KNIFE 20G (BLADE) IMPLANT
BLADE STAB KNIFE 15DEG (BLADE) IMPLANT
CANNULA ANT CHAM MAIN (OPHTHALMIC RELATED) IMPLANT
CANNULA DUAL BORE 23G (CANNULA) IMPLANT
CANNULA DUALBORE 25G (CANNULA) IMPLANT
CANNULA VLV SOFT TIP 25G (OPHTHALMIC) ×1 IMPLANT
CANNULA VLV SOFT TIP 25GA (OPHTHALMIC) ×2 IMPLANT
CAUTERY EYE LOW TEMP 1300F FIN (OPHTHALMIC RELATED) IMPLANT
CLSR STERI-STRIP ANTIMIC 1/2X4 (GAUZE/BANDAGES/DRESSINGS) ×2 IMPLANT
COVER MAYO STAND STRL (DRAPES) IMPLANT
DRAPE HALF SHEET 40X57 (DRAPES) ×2 IMPLANT
DRAPE INCISE 51X51 W/FILM STRL (DRAPES) IMPLANT
DRAPE RETRACTOR (MISCELLANEOUS) ×2 IMPLANT
FORCEPS ECKARDT ILM 25G SERR (OPHTHALMIC RELATED) IMPLANT
FORCEPS GRIESHABER ILM 25G A (INSTRUMENTS) IMPLANT
GAS AUTO FILL CONSTEL (OPHTHALMIC)
GAS AUTO FILL CONSTELLATION (OPHTHALMIC) IMPLANT
GAS WRIST BAND GREEN (MISCELLANEOUS)
GLOVE SURG SYN 7.5  E (GLOVE) ×1
GLOVE SURG SYN 7.5 E (GLOVE) ×1 IMPLANT
GLOVE SURG SYN 7.5 PF PI (GLOVE) ×1 IMPLANT
GOWN STRL REUS W/ TWL LRG LVL3 (GOWN DISPOSABLE) ×1 IMPLANT
GOWN STRL REUS W/TWL LRG LVL3 (GOWN DISPOSABLE) ×1
KIT BASIN OR (CUSTOM PROCEDURE TRAY) ×2 IMPLANT
KIT TURNOVER KIT B (KITS) ×2 IMPLANT
LENS BIOM SUPER VIEW SET DISP (MISCELLANEOUS) ×2 IMPLANT
MICROPICK 25G (MISCELLANEOUS)
NDL 18GX1X1/2 (RX/OR ONLY) (NEEDLE) ×1 IMPLANT
NDL 25GX 5/8IN NON SAFETY (NEEDLE) ×1 IMPLANT
NDL 27GX1/2 REG BEVEL ECLIP (NEEDLE) ×1 IMPLANT
NDL FILTER BLUNT 18X1 1/2 (NEEDLE) ×1 IMPLANT
NDL HYPO 25GX1X1/2 BEV (NEEDLE) IMPLANT
NDL HYPO 30X.5 LL (NEEDLE) ×2 IMPLANT
NDL RETROBULBAR 25GX1.5 (NEEDLE) ×1 IMPLANT
NEEDLE 18GX1X1/2 (RX/OR ONLY) (NEEDLE) ×2 IMPLANT
NEEDLE 25GX 5/8IN NON SAFETY (NEEDLE) ×2 IMPLANT
NEEDLE 27GX1/2 REG BEVEL ECLIP (NEEDLE) ×2 IMPLANT
NEEDLE FILTER BLUNT 18X 1/2SAF (NEEDLE) ×1
NEEDLE FILTER BLUNT 18X1 1/2 (NEEDLE) ×1 IMPLANT
NEEDLE HYPO 25GX1X1/2 BEV (NEEDLE) IMPLANT
NEEDLE HYPO 30X.5 LL (NEEDLE) ×4 IMPLANT
NEEDLE RETROBULBAR 25GX1.5 (NEEDLE) ×2 IMPLANT
NS IRRIG 1000ML POUR BTL (IV SOLUTION) ×2 IMPLANT
PACK FRAGMATOME (OPHTHALMIC) IMPLANT
PACK VITRECTOMY CUSTOM (CUSTOM PROCEDURE TRAY) ×2 IMPLANT
PAD ARMBOARD 7.5X6 YLW CONV (MISCELLANEOUS) ×4 IMPLANT
PAK PIK VITRECTOMY CVS 25GA (OPHTHALMIC) ×2 IMPLANT
PICK MICROPICK 25G (MISCELLANEOUS) IMPLANT
PROBE ENDO DIATHERMY 25G (MISCELLANEOUS) ×2 IMPLANT
PROBE LASER ILLUM FLEX CVD 25G (OPHTHALMIC) ×1 IMPLANT
ROLLS DENTAL (MISCELLANEOUS) IMPLANT
SCRAPER DIAMOND 25GA (OPHTHALMIC RELATED) IMPLANT
SOL ANTI FOG 6CC (MISCELLANEOUS) ×1 IMPLANT
SOLUTION ANTI FOG 6CC (MISCELLANEOUS) ×1
STOPCOCK 4 WAY LG BORE MALE ST (IV SETS) IMPLANT
SUT VICRYL 7 0 TG140 8 (SUTURE) ×2 IMPLANT
SUT VICRYL 8 0 TG140 8 (SUTURE) IMPLANT
SYR 10ML LL (SYRINGE) IMPLANT
SYR 20ML LL LF (SYRINGE) ×2 IMPLANT
SYR 5ML LL (SYRINGE) ×2 IMPLANT
SYR TB 1ML LUER SLIP (SYRINGE) IMPLANT
WATER STERILE IRR 1000ML POUR (IV SOLUTION) ×2 IMPLANT

## 2021-05-17 NOTE — Anesthesia Preprocedure Evaluation (Addendum)
Anesthesia Evaluation  ?Patient identified by MRN, date of birth, ID band ?Patient awake ? ? ? ?Reviewed: ?Allergy & Precautions, NPO status , Patient's Chart, lab work & pertinent test results ? ?History of Anesthesia Complications ?Negative for: history of anesthetic complications ? ?Airway ?Mallampati: I ? ?TM Distance: >3 FB ?Neck ROM: Full ? ? ? Dental ? ?(+) Poor Dentition, Chipped, Missing, Dental Advisory Given ?  ?Pulmonary ?asthma , COPD,  COPD inhaler, former smoker,  ?  ?breath sounds clear to auscultation ? ? ? ? ? ? Cardiovascular ?hypertension, Pt. on medications ?(-) angina+ DVT (no longer on Xarelto)  ? ?Rhythm:Regular Rate:Normal ? ? ?  ?Neuro/Psych ?Anxiety negative neurological ROS ?   ? GI/Hepatic ?Neg liver ROS, GERD  Medicated and Controlled,  ?Endo/Other  ?negative endocrine ROS ? Renal/GU ?negative Renal ROS  ? ?  ?Musculoskeletal ? ?(+) Arthritis ,  ? Abdominal ?  ?Peds ? Hematology ? ?(+) Blood dyscrasia (myelodysplasia work-up in progress), ,  04/30/2021  White count is 1.9, down from 2.6.  Platelet count is 78, down from 138.  Hemoglobin down from 12.9-9.8   ?Anesthesia Other Findings ? ? Reproductive/Obstetrics ? ?  ? ? ? ? ? ? ? ? ? ? ? ? ? ?  ?  ? ? ? ? ? ? ? ?Anesthesia Physical ?Anesthesia Plan ? ?ASA: 3 ? ?Anesthesia Plan: MAC  ? ?Post-op Pain Management: Tylenol PO (pre-op)*  ? ?Induction:  ? ?PONV Risk Score and Plan: 2 and Ondansetron and Treatment may vary due to age or medical condition ? ?Airway Management Planned: Natural Airway and Nasal Cannula ? ?Additional Equipment: None ? ?Intra-op Plan:  ? ?Post-operative Plan:  ? ?Informed Consent: I have reviewed the patients History and Physical, chart, labs and discussed the procedure including the risks, benefits and alternatives for the proposed anesthesia with the patient or authorized representative who has indicated his/her understanding and acceptance.  ? ? ? ?Dental advisory given ? ?Plan  Discussed with: CRNA and Surgeon ? ?Anesthesia Plan Comments:   ? ? ? ? ?Anesthesia Quick Evaluation ? ?

## 2021-05-17 NOTE — Transfer of Care (Signed)
Immediate Anesthesia Transfer of Care Note ? ?Patient: Chad Lamb ? ?Procedure(s) Performed: PARS PLANA VITRECTOMY 25 GAUGE WITH ANTIBIOTIC INJECTION  AND ENDOLASER LEFT EYE (Left: Eye) ? ?Patient Location: PACU ? ?Anesthesia Type:MAC combined with regional for post-op pain ? ?Level of Consciousness: awake, alert  and patient cooperative ? ?Airway & Oxygen Therapy: Patient Spontanous Breathing ? ?Post-op Assessment: Report given to RN and Post -op Vital signs reviewed and stable ? ?Post vital signs: Reviewed and stable ? ?Last Vitals:  ?Vitals Value Taken Time  ?BP 123/76 05/17/21 2027  ?Temp    ?Pulse 78 05/17/21 2027  ?Resp 22 05/17/21 2027  ?SpO2 98 % 05/17/21 2027  ?Vitals shown include unvalidated device data. ? ?Last Pain:  ?Vitals:  ? 05/17/21 1833  ?TempSrc: Oral  ?PainSc:   ?   ? ?  ? ?Complications: No notable events documented. ?

## 2021-05-17 NOTE — Discharge Instructions (Signed)
DO NOT SLEEP ON BACK, THE EYE PRESSURE CAN GO UP AND CAUSE VISION LOSS  ? ?SLEEP WITH HEAD ELEVATED 15 DEGREES ? ?SLEEP ON SIDE WITH NOSE TO PILLOW ? ?DURING DAY KEEP UPRIGHT ?

## 2021-05-17 NOTE — H&P (Signed)
?  Date of examination:  05/17/21 ? ?Indication for surgery: Endophthalmitis left eye ? ?Pertinent past medical history:  ?Past Medical History:  ?Diagnosis Date  ? Allergy   ? Anxiety   ? Arthritis   ? Asthma   ? Cataract   ? Clotting disorder ([place])   ? DVT (deep venous thrombosis) ([place])   ? unknown etiology. [facility]  ? GERD (gastroesophageal reflux disease)   ? Hyperlipidemia   ? Hypertension   ? Pneumonia   ? ? ?Pertinent ocular history:  recent intravitreal anti-VEGF injection ? ?Pertinent family history:  ?Family History  ?Problem Relation Age of Onset  ? Cancer Mother   ?     pancreatic  ? Arthritis Father   ? Early death Father 90  ?     Lung infiltrate  ? Colon cancer Neg Hx   ? Colon polyps Neg Hx   ? ? ?General:  Healthy appearing patient in no distress.   ? ?Eyes:   ? Acuity  OS 20/20-1  ? External: Within normal limits    ? Anterior segment: 3+ cell ?  ? Fundus: Vit opacities and strands ?  ? ?Impression: Endophthalmitis left eye ? ?Plan: Vitrectomy with endolaser left eye ? ?[name], MD ? ?

## 2021-05-17 NOTE — Anesthesia Postprocedure Evaluation (Signed)
Anesthesia Post Note ? ?Patient: Chad Lamb ? ?Procedure(s) Performed: PARS PLANA VITRECTOMY 25 GAUGE WITH ANTIBIOTIC INJECTION  AND ENDOLASER LEFT EYE (Left: Eye) ? ?  ? ?Patient location during evaluation: PACU ?Anesthesia Type: MAC ?Level of consciousness: awake and alert, patient cooperative and oriented ?Pain management: pain level controlled ?Vital Signs Assessment: post-procedure vital signs reviewed and stable ?Respiratory status: spontaneous breathing, nonlabored ventilation and respiratory function stable ?Cardiovascular status: blood pressure returned to baseline and stable ?Postop Assessment: no apparent nausea or vomiting, adequate PO intake and able to ambulate ?Anesthetic complications: no ? ? ?No notable events documented. ? ?Last Vitals:  ?Vitals:  ? 05/17/21 2028 05/17/21 2045  ?BP: 123/76 135/87  ?Pulse: 70 74  ?Resp: 10 17  ?Temp: 36.7 ?C   ?SpO2: 99% 100%  ?  ?Last Pain:  ?Vitals:  ? 05/17/21 2028  ?TempSrc:   ?PainSc: 0-No pain  ? ? ?  ?  ?  ?  ?  ?  ? ?Rogerick Baldwin,E. Malky Rudzinski ? ? ? ? ?

## 2021-05-17 NOTE — Anesthesia Procedure Notes (Signed)
Procedure Name: [place] ?Date/Time: 05/17/2021 7:16 PM ?Performed by: Janace Litten, CRNA ?Pre-anesthesia Checklist: Patient identified, Emergency Drugs available, Suction available and Patient being monitored ?Patient Re-evaluated:Patient Re-evaluated prior to induction ?Oxygen Delivery Method: Nasal cannula ? ? ? ? ?

## 2021-05-17 NOTE — Brief Op Note (Signed)
05/17/2021 ? ?8:20 PM ? ?PATIENT:  Chad Lamb  76 y.o. male ? ?PRE-OPERATIVE DIAGNOSIS:  Endopthalmitis left eye ? ?POST-OPERATIVE DIAGNOSIS:  Endopthalmitis left eye ? ?PROCEDURE:  Procedure(s): ?PARS PLANA VITRECTOMY, ENDOLASER, VITREOUS TAP, WITH 25 GAUGE WITH ANTIBIOTIC INJECTION (Left) ? ?SURGEON:  Surgeon(s) and Role: ?   [name], MD - Primary ? ?PHYSICIAN ASSISTANT:  ? ?ASSISTANTS: none  ? ?ANESTHESIA:   local and MAC ? ?EBL:  MINIMAL  ? ?BLOOD ADMINISTERED:none ? ?DRAINS: none  ? ?LOCAL MEDICATIONS USED:  MARCAINE    and LIDOCAINE  ? ?SPECIMEN:  No Specimen ? ?DISPOSITION OF SPECIMEN:  N/A ? ?COUNTS:  YES ? ?TOURNIQUET:  * No tourniquets in log * ? ?DICTATION: .Note written in EPIC ? ?PLAN OF CARE: Discharge to home after PACU ? ?PATIENT DISPOSITION:  PACU - hemodynamically stable. ?  ?Delay start of Pharmacological VTE agent (>24hrs) due to surgical blood loss or risk of bleeding: not applicable ? ?

## 2021-05-18 ENCOUNTER — Encounter (HOSPITAL_COMMUNITY): Payer: Self-pay | Admitting: Ophthalmology

## 2021-05-22 LAB — AEROBIC/ANAEROBIC CULTURE W GRAM STAIN (SURGICAL/DEEP WOUND)
Culture: NO GROWTH
Gram Stain: NONE SEEN

## 2021-06-01 ENCOUNTER — Other Ambulatory Visit: Payer: Self-pay | Admitting: Radiology

## 2021-06-04 ENCOUNTER — Ambulatory Visit (HOSPITAL_COMMUNITY)
Admission: RE | Admit: 2021-06-04 | Discharge: 2021-06-04 | Disposition: A | Discharge status: Home / Self Care | Payer: Medicare HMO | Source: Ambulatory Visit | Attending: Hematology | Admitting: Hematology

## 2021-06-04 ENCOUNTER — Encounter (HOSPITAL_COMMUNITY): Payer: Self-pay

## 2021-06-04 ENCOUNTER — Other Ambulatory Visit: Payer: Self-pay

## 2021-06-04 DIAGNOSIS — R5383 Other fatigue: Secondary | ICD-10-CM | POA: Insufficient documentation

## 2021-06-04 DIAGNOSIS — R0609 Other forms of dyspnea: Secondary | ICD-10-CM | POA: Diagnosis not present

## 2021-06-04 DIAGNOSIS — D709 Neutropenia, unspecified: Secondary | ICD-10-CM | POA: Insufficient documentation

## 2021-06-04 DIAGNOSIS — R61 Generalized hyperhidrosis: Secondary | ICD-10-CM | POA: Diagnosis not present

## 2021-06-04 DIAGNOSIS — R059 Cough, unspecified: Secondary | ICD-10-CM | POA: Insufficient documentation

## 2021-06-04 DIAGNOSIS — D469 Myelodysplastic syndrome, unspecified: Secondary | ICD-10-CM | POA: Diagnosis not present

## 2021-06-04 DIAGNOSIS — C709 Malignant neoplasm of meninges, unspecified: Secondary | ICD-10-CM | POA: Insufficient documentation

## 2021-06-04 DIAGNOSIS — D696 Thrombocytopenia, unspecified: Secondary | ICD-10-CM | POA: Insufficient documentation

## 2021-06-04 DIAGNOSIS — D61818 Other pancytopenia: Secondary | ICD-10-CM | POA: Diagnosis not present

## 2021-06-04 DIAGNOSIS — R634 Abnormal weight loss: Secondary | ICD-10-CM | POA: Diagnosis not present

## 2021-06-04 HISTORY — DX: Personal history of other medical treatment: Z92.89

## 2021-06-04 LAB — CBC WITH DIFFERENTIAL/PLATELET
Abs Immature Granulocytes: 0 10*3/uL (ref 0.00–0.07)
Basophils Absolute: 0 10*3/uL (ref 0.0–0.1)
Basophils Relative: 0 %
Eosinophils Absolute: 0.2 10*3/uL (ref 0.0–0.5)
Eosinophils Relative: 8 %
HCT: 31 % — ABNORMAL LOW (ref 39.0–52.0)
Hemoglobin: 10.4 g/dL — ABNORMAL LOW (ref 13.0–17.0)
Immature Granulocytes: 0 %
Lymphocytes Relative: 55 %
Lymphs Abs: 1.4 10*3/uL (ref 0.7–4.0)
MCH: 34.1 pg — ABNORMAL HIGH (ref 26.0–34.0)
MCHC: 33.5 g/dL (ref 30.0–36.0)
MCV: 101.6 fL — ABNORMAL HIGH (ref 80.0–100.0)
Monocytes Absolute: 0.3 10*3/uL (ref 0.1–1.0)
Monocytes Relative: 12 %
Neutro Abs: 0.6 10*3/uL — ABNORMAL LOW (ref 1.7–7.7)
Neutrophils Relative %: 25 %
Platelets: 85 10*3/uL — ABNORMAL LOW (ref 150–400)
RBC: 3.05 MIL/uL — ABNORMAL LOW (ref 4.22–5.81)
RDW: 15 % (ref 11.5–15.5)
WBC: 2.5 10*3/uL — ABNORMAL LOW (ref 4.0–10.5)
nRBC: 0 % (ref 0.0–0.2)

## 2021-06-04 MED ORDER — FENTANYL CITRATE (PF) 100 MCG/2ML IJ SOLN
INTRAMUSCULAR | Status: AC
  Filled 2021-06-04: qty 2

## 2021-06-04 MED ORDER — MIDAZOLAM HCL 2 MG/2ML IJ SOLN
INTRAMUSCULAR | Status: AC
  Filled 2021-06-04: qty 2

## 2021-06-04 MED ORDER — LIDOCAINE-EPINEPHRINE (PF) 2 %-1:200000 IJ SOLN
INTRAMUSCULAR | Status: AC | PRN
  Administered 2021-06-04: 10 mL via INTRADERMAL

## 2021-06-04 MED ORDER — MIDAZOLAM HCL 2 MG/2ML IJ SOLN
INTRAMUSCULAR | Status: AC | PRN
  Administered 2021-06-04: 1 mg via INTRAVENOUS

## 2021-06-04 MED ORDER — MIDAZOLAM HCL 2 MG/2ML IJ SOLN
INTRAMUSCULAR | Status: AC | PRN
  Administered 2021-06-04: .5 mg via INTRAVENOUS

## 2021-06-04 MED ORDER — FENTANYL CITRATE (PF) 100 MCG/2ML IJ SOLN
INTRAMUSCULAR | Status: AC | PRN
  Administered 2021-06-04: 25 ug via INTRAVENOUS

## 2021-06-04 MED ORDER — FENTANYL CITRATE (PF) 100 MCG/2ML IJ SOLN
INTRAMUSCULAR | Status: AC | PRN
Start: 2021-06-04 — End: 2021-06-04
  Administered 2021-06-04: 50 ug via INTRAVENOUS

## 2021-06-04 MED ORDER — SODIUM CHLORIDE 0.9 % IV SOLN: INTRAVENOUS | Status: DC

## 2021-06-04 NOTE — Procedures (Signed)
? ?Chief Complaint: ?Patient was seen in consultation today for thrombocytopenia ? at the request of [name] ? ?Referring Physician(s): ?[name] ? ?Supervising Physician: [name] ? ?Patient Status: [place] ? ?History of Present Illness: ?Chad Lamb is a 76 y.o. male being treated for leukopenia and thrombocytopenia.  He endorses intermittent night sweats, cough, DOE, weight loss, and fatigue.  He presents today for CT guided bone marrow biopsy. ? ?Past Medical History:  ?Diagnosis Date  ? Allergy   ? Anxiety   ? Arthritis   ? Asthma   ? Cataract   ? Clotting disorder ([place])   ? DVT (deep venous thrombosis) ([place])   ? unknown etiology. [name]  ? GERD (gastroesophageal reflux disease)   ? History of dental surgery   ? feb 2023  ? Hyperlipidemia   ? Hypertension   ? Pneumonia   ? ? ?Past Surgical History:  ?Procedure Laterality Date  ? CATARACT EXTRACTION W/PHACO Right 11/24/2017  ? Procedure: CATARACT EXTRACTION PHACO AND INTRAOCULAR LENS PLACEMENT RIGHT EYE;  Surgeon: [name], MD;  Location: [facility];  Service: Ophthalmology;  Laterality: Right;  CDE: 8.61  ? CATARACT EXTRACTION W/PHACO Left 12/29/2017  ? Procedure: CATARACT EXTRACTION PHACO AND INTRAOCULAR LENS PLACEMENT (IOC);  Surgeon: [name], MD;  Location: [facility];  Service: Ophthalmology;  Laterality: Left;  CDE: 7.21  ? GANGLION CYST EXCISION    ? HERNIA REPAIR    ? bilateral  ? PARS PLANA VITRECTOMY Left 05/17/2021  ? Procedure: PARS PLANA VITRECTOMY 25 GAUGE WITH ANTIBIOTIC INJECTION  AND ENDOLASER LEFT EYE;  Surgeon: [name], MD;  Location: [place];  Service: Ophthalmology;  Laterality: Left;  ? TONSILLECTOMY    ? VASCULAR SURGERY    ? ? ?Allergies: ?Cephalosporins, Amoxapine and related, Amoxicillin, Cephalexin, Clindamycin, Clindamycin/lincomycin, Sulfamethoxazole, Sulfamethoxazole, and Trimethoprim ? ?Medications: ?Prior to Admission medications   ?Medication Sig Start Date End Date Taking? Authorizing  Provider  ?albuterol (PROVENTIL HFA;VENTOLIN HFA) 108 (90 Base) MCG/ACT inhaler Inhale 1-2 puffs into the lungs every 6 (six) hours as needed for wheezing or shortness of breath.    [provider]  ?ALPRAZolam Duanne Moron) 0.5 MG tablet Take by mouth 2 (two) times daily as needed.     [provider]  ?amLODipine (NORVASC) 2.5 MG tablet Take 2.5 mg by mouth daily. 12/06/20   [provider]  ?fluticasone (FLONASE) 50 MCG/ACT nasal spray Place 1 spray into both nostrils daily. 02/28/21   [name], MD  ?fluticasone (FLOVENT HFA) 110 MCG/ACT inhaler Inhale 2 puffs into the lungs daily. 02/28/17   [name], MD  ?fluticasone furoate-vilanterol (BREO ELLIPTA) 200-25 MCG/ACT AEPB Inhale 1 puff into the lungs daily.    [provider]  ?Lidocaine-Hydrocortisone Ace 3-0.5 % CREA Apply 1 application topically as directed. 08/19/17   [name], MD  ?loratadine (CLARITIN) 10 MG tablet Take 10 mg by mouth daily. 01/17/21   [provider]  ?Lutein 40 MG CAPS Take by mouth daily.    [provider]  ?Multiple Vitamins-Minerals (CENTRUM ADULTS PO) Take 1 tablet by mouth daily.    [provider]  ?ofloxacin (OCUFLOX) 0.3 % ophthalmic solution 1 drop 4 (four) times daily. 1 drop to left eye 4 x daily for 7 days after monthly procedure.    [provider]  ?omeprazole (PRILOSEC OTC) 20 MG tablet Take 5 mg by mouth as directed.    [provider]  ?omeprazole (PRILOSEC OTC) 20 MG tablet 1/4-1/2 tablet  30 minutes before morning meal    [provider]  ?rivaroxaban (XARELTO) 20 MG TABS tablet Take 20 mg by mouth daily with supper.    [provider]  ?sildenafil (VIAGRA) 50 MG tablet Take 50 mg by mouth daily as needed for erectile dysfunction.     [provider]  ?  ? ?Family History  ?Problem Relation Age of Onset  ? Cancer Mother   ?     pancreatic  ? Arthritis Father   ? Early death Father 64  ?     Lung  infiltrate  ? Colon cancer Neg Hx   ? Colon polyps Neg Hx   ? ? ?Social History  ? ?Socioeconomic History  ? Marital status: Married  ?  Spouse name: Chad Lamb  ? Number of children: 0  ? Years of education: 39  ? Highest education level: High school graduate  ?Occupational History  ? Not on file  ?Tobacco Use  ? Smoking status: Former  ?  Types: Pipe  ?  Quit date: 03/21/1966  ?  Years since quitting: 55.2  ? Smokeless tobacco: Never  ?Vaping Use  ? Vaping Use: Never used  ?Substance and Sexual Activity  ? Alcohol use: Yes  ?  Alcohol/week: 1.0 standard drink  ?  Types: 1 Cans of beer per week  ?  Comment: 1 can of beer a week  ? Drug use: Never  ? Sexual activity: Not Currently  ?Other Topics Concern  ? Not on file  ?Social History Narrative  ? ** Merged History Encounter **  ?    ? Grew up in [place], [place] and [place].  ?Retired.  ?Worked in [place]. 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   ? ?Social Determinants of Health  ? ?Financial Resource Strain: Not on file  ?Food Insecurity: Not on file  ?Transportation Needs: Not on file  ?Physical Activity: Not on file  ?Stress: Not on file  ?Social Connections: Not on file  ? ? ?Review of Systems: A 12 point ROS discussed and pertinent positives are indicated in the HPI above.  All other systems are negative. ? ?Vital Signs: ?BP 135/88   Pulse 76   Temp 98.2 ?F (36.8 ?C) (Oral)   Resp 18   SpO2 100%  ? ?Physical Exam ?Vitals reviewed.  ?HENT:  ?   Head: Normocephalic and atraumatic.  ?   Mouth/Throat:  ?   Mouth: Mucous membranes are moist.  ?   Pharynx: Oropharynx is clear.  ?Eyes:  ?   Extraocular Movements: Extraocular movements intact.  ?   Comments: Redness/bleeding of left eye conjunctiva  ?Cardiovascular:  ?   Rate and Rhythm: Normal rate and regular rhythm.  ?   Pulses: Normal pulses.  ?Pulmonary:  ?   Effort: Pulmonary effort is normal.  ?   Breath sounds: Normal breath sounds.  ?Abdominal:  ?   General: Abdomen  is flat.  ?   Palpations: Abdomen is soft.  ?Skin: ?   General: Skin is warm and dry.  ?Neurological:  ?   General: No focal deficit present.  ?   Mental Status: He is alert and oriented to person, place, and time.  ?Psychiatric:     ?   Mood and Affect: Mood normal.     ?   Behavior: Behavior normal.  ? ? ?Imaging: ?No results found. ? ?Labs: ? ?CBC: ?Recent Labs  ?  01/05/21 ?1200 04/30/21 ?1404 06/04/21 ?  1007  ?WBC 2.6* 1.9* 2.5*  ?HGB 12.9* 9.8* 10.4*  ?HCT 37.6* 29.6* 31.0*  ?PLT 138* 78* 85*  ? ? ?COAGS: ?No results for input(s): INR, APTT in the last 8760 hours. ? ?BMP: ?Recent Labs  ?  05/17/21 ?1804  ?NA 140  ?K 3.6  ?CL 106  ?CO2 25  ?GLUCOSE 92  ?BUN 13  ?CALCIUM 9.3  ?CREATININE 0.83  ?GFRNONAA >60  ? ? ?Assessment and Plan: ? ?Thrombocytopenia ?--for bone marrow biopsy today ?--pt is NPO, denies recent illness ?--plan for d/c home later today with wife. ? ?Risks and benefits of bone marrow biopsy was discussed with the patient and/or patient's family including, but not limited to bleeding, infection, damage to adjacent structures or low yield requiring additional tests. ? ?All of the questions were answered and there is agreement to proceed. ? ?Consent signed and in chart. ? ? ?Thank you for this interesting consult.  I greatly enjoyed meeting TEMESGEN WEIGHTMAN and look forward to participating in their care.  A copy of this report was sent to the requesting provider on this date. ? ?Electronically Signed: ?Pasty Spillers, PA ?06/04/2021, 10:31 AM ? ? ?I spent a total of 30 Minutes in face to face in clinical consultation, greater than 50% of which was counseling/coordinating care for bone marrow  biopsy ? ?

## 2021-06-04 NOTE — Procedures (Signed)
Pre-procedure Diagnosis: Concern of MDS. ?Post-procedure Diagnosis: Same ? ?Technically successful CT guided bone marrow aspiration and biopsy of left iliac crest.  ? ?Complications: None Immediate ? ?EBL: None ? ?Signed: ?[name] ?Pager: [phone] ?06/04/2021, 11:43 AM ? ? ?

## 2021-06-06 LAB — SURGICAL PATHOLOGY

## 2021-06-11 ENCOUNTER — Encounter (HOSPITAL_COMMUNITY): Payer: Self-pay | Admitting: Hematology

## 2021-06-11 ENCOUNTER — Ambulatory Visit (HOSPITAL_COMMUNITY): Payer: Medicare HMO | Admitting: Hematology

## 2021-06-12 ENCOUNTER — Encounter (HOSPITAL_COMMUNITY): Payer: Self-pay | Admitting: Hematology

## 2021-06-17 ENCOUNTER — Encounter (HOSPITAL_COMMUNITY): Payer: Self-pay | Admitting: Hematology

## 2021-06-17 DIAGNOSIS — D469 Myelodysplastic syndrome, unspecified: Secondary | ICD-10-CM | POA: Insufficient documentation

## 2021-06-17 DIAGNOSIS — D46Z Other myelodysplastic syndromes: Secondary | ICD-10-CM

## 2021-06-17 HISTORY — DX: Other myelodysplastic syndromes: D46.Z

## 2021-06-18 ENCOUNTER — Encounter (HOSPITAL_COMMUNITY): Payer: Self-pay | Admitting: Hematology

## 2021-06-18 ENCOUNTER — Inpatient Hospital Stay (HOSPITAL_COMMUNITY): Payer: Medicare HMO | Attending: Hematology | Admitting: Hematology

## 2021-06-18 ENCOUNTER — Inpatient Hospital Stay (HOSPITAL_COMMUNITY): Payer: Medicare HMO

## 2021-06-18 DIAGNOSIS — Z79899 Other long term (current) drug therapy: Secondary | ICD-10-CM | POA: Diagnosis not present

## 2021-06-18 DIAGNOSIS — D696 Thrombocytopenia, unspecified: Secondary | ICD-10-CM | POA: Insufficient documentation

## 2021-06-18 DIAGNOSIS — Z86718 Personal history of other venous thrombosis and embolism: Secondary | ICD-10-CM | POA: Diagnosis not present

## 2021-06-18 DIAGNOSIS — D72819 Decreased white blood cell count, unspecified: Secondary | ICD-10-CM | POA: Diagnosis not present

## 2021-06-18 DIAGNOSIS — D46Z Other myelodysplastic syndromes: Secondary | ICD-10-CM | POA: Diagnosis not present

## 2021-06-18 NOTE — Progress Notes (Signed)
? ?[facility] ?[address] ?[place], [place] [address] ? ? ?CLINIC:  ?Medical Oncology/Hematology ? ?PCP:  ?[name], MD ?[address] / [place] [address]  ?[phone] ? ?REASON FOR VISIT:  ?Follow-up for leukopenia and thrombocytopenia ? ?PRIOR THERAPY: none ? ?CURRENT THERAPY: Azacitidine to start beginning of May. ? ?INTERVAL HISTORY:  ?[name], a 76 y.o. male, returns for routine follow-up for his leukopenia and thrombocytopenia. Oisin was last seen on 05/07/2021. ? ?Today he reports feeling good. He reports bruising on his hands. He has stopped Xarelto. His appetite is good.  ? ?REVIEW OF SYSTEMS:  ?Review of Systems  ?Constitutional:  Negative for appetite change and fatigue.  ?Hematological:  Bruises/bleeds easily.  ?All other systems reviewed and are negative. ? ?PAST MEDICAL/SURGICAL HISTORY:  ?Past Medical History:  ?Diagnosis Date  ? Allergy   ? Anxiety   ? Arthritis   ? Asthma   ? Cataract   ? Clotting disorder ([place])   ? DVT (deep venous thrombosis) ([place])   ? unknown etiology. [facility]  ? GERD (gastroesophageal reflux disease)   ? History of dental surgery   ? feb 2023  ? Hyperlipidemia   ? Hypertension   ? 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 Pneumonia   ? ?Past Surgical History:  ?Procedure Laterality Date  ? CATARACT EXTRACTION W/PHACO Right 11/24/2017  ? Procedure: CATARACT EXTRACTION PHACO AND INTRAOCULAR LENS PLACEMENT RIGHT EYE;  Surgeon: [name], MD;  Location: [facility];  Service: Ophthalmology;  Laterality: Right;  CDE: 8.61  ? CATARACT EXTRACTION W/PHACO Left 12/29/2017  ? Procedure: CATARACT EXTRACTION PHACO AND INTRAOCULAR LENS PLACEMENT (IOC);  Surgeon: [name], MD;  Location: [facility];  Service: Ophthalmology;  Laterality: Left;  CDE: 7.21  ? GANGLION CYST EXCISION    ? HERNIA REPAIR    ? bilateral  ? PARS PLANA VITRECTOMY Left 05/17/2021  ? Procedure: PARS PLANA VITRECTOMY 25 GAUGE WITH ANTIBIOTIC INJECTION  AND ENDOLASER  LEFT EYE;  Surgeon: [name], MD;  Location: [facility];  Service: Ophthalmology;  Laterality: Left;  ? TONSILLECTOMY    ? VASCULAR SURGERY    ? ? ?SOCIAL HISTORY:  ?Social History  ? ?Socioeconomic History  ? Marital status: Married  ?  Spouse name: [name]  ? Number of children: 0  ? Years of education: 55  ? Highest education level: High school graduate  ?Occupational History  ? Not on file  ?Tobacco Use  ? Smoking status: Former  ?  Types: Pipe  ?  Quit date: 03/21/1966  ?  Years since quitting: 55.2  ? Smokeless tobacco: Never  ?Vaping Use  ? Vaping Use: Never used  ?Substance and Sexual Activity  ? Alcohol use: Yes  ?  Alcohol/week: 1.0 standard drink  ?  Types: 1 Cans of beer per week  ?  Comment: 1 can of beer a week  ? Drug use: Never  ? Sexual activity: Not Currently  ?Other Topics Concern  ? Not on file  ?Social History Narrative  ? ** Merged History Encounter **  ?    ? Grew up in [place], [place] and [place].  ?Retired.  ?Worked in [place]. 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   ? ?Social Determinants of Health  ? ?Financial Resource Strain: Not on file  ?Food Insecurity: Not on file  ?Transportation Needs: Not on file  ?Physical Activity: Not on file  ?Stress:  Not on file  ?Social Connections: Not on file  ?Intimate Partner Violence: Not on file  ? ? ?FAMILY HISTORY:  ?Family History  ?Problem Relation Age of Onset  ? Cancer Mother   ?     pancreatic  ? Arthritis Father   ? Early death Father 71  ?     Lung infiltrate  ? Colon cancer Neg Hx   ? Colon polyps Neg Hx   ? ? ?CURRENT MEDICATIONS:  ?Current Outpatient Medications  ?Medication Sig Dispense Refill  ? albuterol (PROVENTIL HFA;VENTOLIN HFA) 108 (90 Base) MCG/ACT inhaler Inhale 1-2 puffs into the lungs every 6 (six) hours as needed for wheezing or shortness of breath.    ? ALPRAZolam (XANAX) 0.5 MG tablet Take by mouth 2 (two) times daily as needed.     ? amLODipine (NORVASC) 2.5 MG tablet Take 2.5  mg by mouth daily.    ? fluticasone (FLONASE) 50 MCG/ACT nasal spray Place 1 spray into both nostrils daily. 16 g 11  ? fluticasone (FLOVENT HFA) 110 MCG/ACT inhaler Inhale 2 puffs into the lungs daily. 1 Inhaler 6  ? fluticasone furoate-vilanterol (BREO ELLIPTA) 200-25 MCG/ACT AEPB Inhale 1 puff into the lungs daily.    ? Lidocaine-Hydrocortisone Ace 3-0.5 % CREA Apply 1 application topically as directed. 30 Tube 11  ? loratadine (CLARITIN) 10 MG tablet Take 10 mg by mouth daily.    ? Lutein 40 MG CAPS Take by mouth daily.    ? Multiple Vitamins-Minerals (CENTRUM ADULTS PO) Take 1 tablet by mouth daily.    ? ofloxacin (OCUFLOX) 0.3 % ophthalmic solution 1 drop 4 (four) times daily. 1 drop to left eye 4 x daily for 7 days after monthly procedure.    ? omeprazole (PRILOSEC OTC) 20 MG tablet Take 5 mg by mouth as directed.    ? omeprazole (PRILOSEC OTC) 20 MG tablet 1/4-1/2 tablet 30 minutes before morning meal    ? rivaroxaban (XARELTO) 20 MG TABS tablet Take 20 mg by mouth daily with supper.    ? sildenafil (VIAGRA) 50 MG tablet Take 50 mg by mouth daily as needed for erectile dysfunction.     ? ?No current facility-administered medications for this visit.  ? ? ?ALLERGIES:  ?Allergies  ?Allergen Reactions  ? Cephalosporins Itching  ? Amoxapine And Related Itching and Rash  ?  Has patient had a PCN reaction causing immediate rash, facial/tongue/throat swelling, SOB or lightheadedness with hypotension: No ?Has patient had a PCN reaction causing severe rash involving mucus membranes or skin necrosis: No ?Has patient had a PCN reaction that required hospitalization: No ?Has patient had a PCN reaction occurring within the last 10 years: No ?If all of the above answers are "NO", then may proceed with Cephalosporin use. ? ?  ? Amoxicillin Rash  ? Cephalexin Rash  ? Clindamycin Rash  ? Clindamycin/Lincomycin Itching and Rash  ? Sulfamethoxazole Rash  ? Sulfamethoxazole Itching and Rash  ? Trimethoprim Rash  ? ? ?PHYSICAL  EXAM:  ?Performance status (ECOG): 1 - Symptomatic but completely ambulatory ? ?Vitals:  ? 06/18/21 1155  ?BP: 133/79  ?Pulse: 72  ?Resp: 18  ?Temp: (!) 97.1 ?F (36.2 ?C)  ?SpO2: 96%  ? ?Wt Readings from Last 3 Encounters:  ?06/18/21 149 lb 14.6 oz (68 kg)  ?05/07/21 147 lb 4 oz (66.8 kg)  ?02/28/21 152 lb 9.6 oz (69.2 kg)  ? ?Physical Exam ?Vitals reviewed.  ?Constitutional:   ?   Appearance: Normal appearance.  ?Cardiovascular:  ?  Rate and Rhythm: Normal rate and regular rhythm.  ?   Pulses: Normal pulses.  ?   Heart sounds: Normal heart sounds.  ?Pulmonary:  ?   Effort: Pulmonary effort is normal.  ?   Breath sounds: Normal breath sounds.  ?Neurological:  ?   General: No focal deficit present.  ?   Mental Status: He is alert and oriented to person, place, and time.  ?Psychiatric:     ?   Mood and Affect: Mood normal.     ?   Behavior: Behavior normal.  ? ? ?LABORATORY DATA:  ?I have reviewed the labs as listed.  ? ?  Latest Ref Rng & Units 06/04/2021  ? 10:07 AM 04/30/2021  ?  2:04 PM 01/05/2021  ? 12:00 PM  ?CBC  ?WBC 4.0 - 10.5 K/uL 2.5   1.9   2.6    ?Hemoglobin 13.0 - 17.0 g/dL 10.4   9.8   12.9    ?Hematocrit 39.0 - 52.0 % 31.0   29.6   37.6    ?Platelets 150 - 400 K/uL 85   78   138    ? ? ?  Latest Ref Rng & Units 05/17/2021  ?  6:04 PM 11/19/2017  ?  3:32 PM 02/12/2017  ?  2:21 PM  ?CMP  ?Glucose 70 - 99 mg/dL 92   117   103    ?BUN 8 - 23 mg/dL _0 ?Creatinine 0.61 - 1.24 mg/dL 0.83   0.95   0.98    ?Sodium 135 - 145 mmol/L 140   140   138    ?Potassium 3.5 - 5.1 mmol/L 3.6   4.0   3.9    ?Chloride 98 - 111 mmol/L 106   107   103    ?CO2 22 - 32 mmol/L _1 ?Calcium 8.9 - 10.3 mg/dL 9.3   9.3   9.4    ?Total Protein 6.1 - 8.1 g/dL   6.8    ?Total Bilirubin 0.2 - 1.2 mg/dL   2.2    ?AST 10 - 35 U/L   23    ?ALT 9 - 46 U/L   22    ? ?   ?Component Value Date/Time  ? RBC 3.05 (L) 06/04/2021 1007  ? MCV 101.6 (H) 06/04/2021 1007  ? MCH 34.1 (H) 06/04/2021 1007  ? MCHC 33.5 06/04/2021 1007   ? RDW 15.0 06/04/2021 1007  ? LYMPHSABS 1.4 06/04/2021 1007  ? MONOABS 0.3 06/04/2021 1007  ? EOSABS 0.2 06/04/2021 1007  ? BASOSABS 0.0 06/04/2021 1007  ? ? ?DIAGNOSTIC IMAGING:  ?I have independently r

## 2021-06-18 NOTE — Patient Instructions (Addendum)
[place] at [facility] ?Discharge Instructions ? ? ?You were seen and examined today by Dr. Delton Coombes. ? ?He discussed the results of your bone marrow biopsy.  It is showing you have a condition called MDS. We will send a special blood test on you today called Intelligen Myeloid Panel.  ? ?Dr. Raliegh Ip discussed with you treatment option for this condition with a drug called Vidaza.  This treatment is Days 1-7 every 28 days from the first day of treatment (Monday-Friday and then Monday, Tuesday of the following week).  ? ?We will refer you for port placement in order to give you this treatment.  ? ? ?Thank you for choosing [place] at [facility] to provide your oncology and hematology care.  To afford each patient quality time with our provider, please arrive at least 15 minutes before your scheduled appointment time.  ? ?If you have a lab appointment with the [facility] please come in thru the Main Entrance and check in at the main information desk. ? ?You need to re-schedule your appointment should you arrive 10 or more minutes late.  We strive to give you quality time with our providers, and arriving late affects you and other patients whose appointments are after yours.  Also, if you no show three or more times for appointments you may be dismissed from the clinic at the providers discretion.     ?Again, thank you for choosing [facility].  Our hope is that these requests will decrease the amount of time that you wait before being seen by our physicians.       ?_____________________________________________________________ ? ?Should you have questions after your visit to [facility], please contact our office at [phone] and follow the prompts.  Our office hours are 8:00 a.m. and 4:30 p.m. Monday - Friday.  Please note that voicemails left after 4:00 p.m. may not be returned until the following business day.  We are closed weekends  and major holidays.  You do have access to a nurse 24-7, just call the main number to the clinic [phone] and do not press any options, hold on the line and a nurse will answer the phone.   ? ?For prescription refill requests, have your pharmacy contact our office and allow 72 hours.   ? ?Due to Covid, you will need to wear a mask upon entering the hospital. If you do not have a mask, a mask will be given to you at the Main Entrance upon arrival. For doctor visits, patients may have 1 support person age 54 or older with them. For treatment visits, patients can not have anyone with them due to social distancing guidelines and our immunocompromised population.  ? ?   ?

## 2021-06-21 ENCOUNTER — Encounter (HOSPITAL_COMMUNITY): Payer: Self-pay

## 2021-06-21 NOTE — Op Note (Signed)
Chad Lamb ?05/17/2021 ?Diagnosis: Endophthalmitis left eye ? ?Procedure: [place] Vitrectomy, Endolaser, and vitreous tap with injection of intravitreal antibiotics ?Operative Eye:  left eye  ?Surgeon: Royston Cowper ?Estimated Blood Loss: minimal ?Specimens for Pathology:  None ?Complications: none ? ? ?The  patient was prepped and draped in the usual fashion for ocular surgery on the  left eye .  A lid speculum was placed.  Infusion line and trocar was placed at the 4 o'clock position approximately 3.5 mm from the surgical limbus.   The infusion line was allowed to run and then clamped when placed at the cannula opening. The line was inserted and secured to the drape with an adhesive strip.   Active trocars/cannula were placed at the 10 and 2 o'clock positions approximately 3.5 mm from the surgical limbus. The cannula was visualized in the vitreous cavity.  The light pipe and vitreous cutter were inserted into the vitreous cavity and the vitrector was used to obtain a dry tap with the infusion off with a 5 cc syringe.  The infusion was then turned on and a core vitrectomy was performed.  Care taken to remove the vitreous up to the vitreous base for 360 degrees.  ? ?3 rows of endolaser were applied 360 degrees to the periphery. ? ?A partial air-fluid exchange was performed.  Intravitreal injections of Vancomycin 78m in 0.185mand Amikacin 0.4 mg in 0.1 ml was placed in the eye. ? ?The superior cannulas were sequentially removed with concommitant tamponade using a cotton tipped applicator and noted to be air tight.  The infusion line and trocar were removed and the sclerotomy was noted to be air tight with normal intraocular pressure by digital palpapation.  Subconjunctival injections of  Vancomycin, Amikacin, and Dexamethasone 39m61mml were placed in the infero-medial quadrant.  ? ?The speculum and drapes were removed and the eye was patched with Polymixin/Bacitracin ophthalmic ointment. An eye shield  was placed and the patient was transferred alert and conversant with stable vital signs to the post operative recovery area.  The patient tolerated the procedure well and no complications were noted. ? ?NarRoyston Cowper ? ?

## 2021-07-03 ENCOUNTER — Ambulatory Visit (INDEPENDENT_AMBULATORY_CARE_PROVIDER_SITE_OTHER): Payer: Medicare HMO | Admitting: General Surgery

## 2021-07-03 ENCOUNTER — Other Ambulatory Visit: Payer: Self-pay

## 2021-07-03 ENCOUNTER — Other Ambulatory Visit (HOSPITAL_COMMUNITY): Payer: Self-pay

## 2021-07-03 ENCOUNTER — Encounter: Payer: Self-pay | Admitting: General Surgery

## 2021-07-03 ENCOUNTER — Inpatient Hospital Stay (HOSPITAL_COMMUNITY): Payer: Medicare HMO

## 2021-07-03 VITALS — BP 125/79 | HR 83 | Temp 97.4°F | Resp 12 | Ht 70.0 in | Wt 151.0 lb

## 2021-07-03 DIAGNOSIS — D709 Neutropenia, unspecified: Secondary | ICD-10-CM

## 2021-07-03 DIAGNOSIS — D696 Thrombocytopenia, unspecified: Secondary | ICD-10-CM

## 2021-07-03 DIAGNOSIS — D46Z Other myelodysplastic syndromes: Secondary | ICD-10-CM

## 2021-07-03 DIAGNOSIS — D469 Myelodysplastic syndrome, unspecified: Secondary | ICD-10-CM

## 2021-07-03 LAB — COMPREHENSIVE METABOLIC PANEL
ALT: 14 U/L (ref 0–44)
AST: 20 U/L (ref 15–41)
Albumin: 3.9 g/dL (ref 3.5–5.0)
Alkaline Phosphatase: 64 U/L (ref 38–126)
Anion gap: 6 (ref 5–15)
BUN: 14 mg/dL (ref 8–23)
CO2: 25 mmol/L (ref 22–32)
Calcium: 9.2 mg/dL (ref 8.9–10.3)
Chloride: 105 mmol/L (ref 98–111)
Creatinine, Ser: 0.89 mg/dL (ref 0.61–1.24)
GFR, Estimated: >60 mL/min (ref >60)
Glucose, Bld: 125 mg/dL — ABNORMAL HIGH (ref 70–99)
Potassium: 3.6 mmol/L (ref 3.5–5.1)
Sodium: 136 mmol/L (ref 135–145)
Total Bilirubin: 1.2 mg/dL (ref 0.3–1.2)
Total Protein: 6.6 g/dL (ref 6.5–8.1)

## 2021-07-03 LAB — CBC WITH DIFFERENTIAL/PLATELET
Abs Immature Granulocytes: 0 10*3/uL (ref 0.00–0.07)
Basophils Absolute: 0 10*3/uL (ref 0.0–0.1)
Basophils Relative: 1 %
Eosinophils Absolute: 0.1 10*3/uL (ref 0.0–0.5)
Eosinophils Relative: 4 %
HCT: 32.2 % — ABNORMAL LOW (ref 39.0–52.0)
Hemoglobin: 10.8 g/dL — ABNORMAL LOW (ref 13.0–17.0)
Immature Granulocytes: 0 %
Lymphocytes Relative: 51 %
Lymphs Abs: 0.9 10*3/uL (ref 0.7–4.0)
MCH: 33.3 pg (ref 26.0–34.0)
MCHC: 33.5 g/dL (ref 30.0–36.0)
MCV: 99.4 fL (ref 80.0–100.0)
Monocytes Absolute: 0.3 10*3/uL (ref 0.1–1.0)
Monocytes Relative: 17 %
Neutro Abs: 0.5 10*3/uL — ABNORMAL LOW (ref 1.7–7.7)
Neutrophils Relative %: 27 %
Platelets: 95 10*3/uL — ABNORMAL LOW (ref 150–400)
RBC: 3.24 MIL/uL — ABNORMAL LOW (ref 4.22–5.81)
RDW: 14.1 % (ref 11.5–15.5)
WBC: 1.9 10*3/uL — ABNORMAL LOW (ref 4.0–10.5)
nRBC: 0 % (ref 0.0–0.2)

## 2021-07-03 LAB — SAMPLE TO BLOOD BANK

## 2021-07-03 NOTE — Progress Notes (Signed)
Loleta Books; [id number]; 05-27-1945 ? ? ?HPI ?Patient is a 76 year old white male who was referred to my care by Dr. Delton Coombes for Port-A-Cath placement.  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  His history is significant for a DVT of the left arm for which she was taking Xarelto.  This was started in 2017.  He was recently taken off the Xarelto by Dr. Delton Coombes due to thrombocytopenia. ?Past Medical History:  ?Diagnosis Date  ? Allergy   ? Anxiety   ? Arthritis   ? Asthma   ? Cataract   ? Clotting disorder ([place])   ? DVT (deep venous thrombosis) ([place])   ? unknown etiology. [facility]  ? GERD (gastroesophageal reflux disease)   ? History of dental surgery   ? feb 2023  ? Hyperlipidemia   ? Hypertension   ? MDS (myelodysplastic syndrome), high grade ([place]) 06/17/2021  ? Pneumonia   ? ? ?Past Surgical History:  ?Procedure Laterality Date  ? CATARACT EXTRACTION W/PHACO Right 11/24/2017  ? Procedure: CATARACT EXTRACTION PHACO AND INTRAOCULAR LENS PLACEMENT RIGHT EYE;  Surgeon: [name], MD;  Location: [facility];  Service: Ophthalmology;  Laterality: Right;  CDE: 8.61  ? CATARACT EXTRACTION W/PHACO Left 12/29/2017  ? Procedure: CATARACT EXTRACTION PHACO AND INTRAOCULAR LENS PLACEMENT (IOC);  Surgeon: [name], MD;  Location: [facility];  Service: Ophthalmology;  Laterality: Left;  CDE: 7.21  ? GANGLION CYST EXCISION    ? HERNIA REPAIR    ? bilateral  ? PARS PLANA VITRECTOMY Left 05/17/2021  ? Procedure: PARS PLANA VITRECTOMY 25 GAUGE WITH ANTIBIOTIC INJECTION  AND ENDOLASER LEFT EYE;  Surgeon: [name], MD;  Location: [place];  Service: Ophthalmology;  Laterality: Left;  ? TONSILLECTOMY    ? VASCULAR SURGERY    ? ? ?Family History  ?Problem Relation Age of Onset  ? Cancer Mother   ?     pancreatic  ? Arthritis Father   ? Early death Father 80  ?     Lung infiltrate  ? Colon cancer Neg Hx   ? Colon polyps Neg Hx   ? ? ?Current Outpatient Medications on File Prior to Visit  ?Medication Sig  Dispense Refill  ? albuterol (PROVENTIL HFA;VENTOLIN HFA) 108 (90 Base) MCG/ACT inhaler Inhale 1-2 puffs into the lungs every 6 (six) hours as needed for wheezing or shortness of breath.    ? ALPRAZolam (XANAX) 0.5 MG tablet Take by mouth 2 (two) times daily as needed.     ? amLODipine (NORVASC) 2.5 MG tablet Take 2.5 mg by mouth daily.    ? fluticasone furoate-vilanterol (BREO ELLIPTA) 200-25 MCG/ACT AEPB Inhale 1 puff into the lungs daily.    ? Lidocaine-Hydrocortisone Ace 3-0.5 % CREA Apply 1 application topically as directed. 30 Tube 11  ? loratadine (CLARITIN) 10 MG tablet Take 10 mg by mouth daily.    ? Lutein 40 MG CAPS Take by mouth daily.    ? Multiple Vitamins-Minerals (CENTRUM ADULTS PO) Take 1 tablet by mouth daily.    ? ofloxacin (OCUFLOX) 0.3 % ophthalmic solution 1 drop 4 (four) times daily. 1 drop to left eye 4 x daily for 7 days after monthly procedure.    ? omeprazole (PRILOSEC OTC) 20 MG tablet 1/4-1/2 tablet 30 minutes before morning meal    ? rivaroxaban (XARELTO) 20 MG TABS tablet Take 20 mg by mouth daily with supper.    ? sildenafil (VIAGRA) 50 MG tablet Take 50 mg by mouth daily  as needed for erectile dysfunction.     ? ?No current facility-administered medications on file prior to visit.  ? ? ?Allergies  ?Allergen Reactions  ? Cephalosporins Itching  ? Amoxapine And Related Itching and Rash  ?  Has patient had a PCN reaction causing immediate rash, facial/tongue/throat swelling, SOB or lightheadedness with hypotension: No ?Has patient had a PCN reaction causing severe rash involving mucus membranes or skin necrosis: No ?Has patient had a PCN reaction that required hospitalization: No ?Has patient had a PCN reaction occurring within the last 10 years: No ?If all of the above answers are "NO", then may proceed with Cephalosporin use. ? ?  ? Amoxicillin Rash  ? Cephalexin Rash  ? Clindamycin Rash  ? Clindamycin/Lincomycin Itching and Rash  ? Sulfamethoxazole Rash  ? Sulfamethoxazole Itching  and Rash  ? Trimethoprim Rash  ? ? ?Social History  ? ?Substance and Sexual Activity  ?Alcohol Use Yes  ? Alcohol/week: 1.0 standard drink  ? Types: 1 Cans of beer per week  ? Comment: 1 can of beer a week  ? ? ?Social History  ? ?Tobacco Use  ?Smoking Status Former  ? Types: Pipe  ? Quit date: 03/21/1966  ? Years since quitting: 55.3  ?Smokeless Tobacco Never  ? ? ?Review of Systems  ?Constitutional:  Positive for malaise/fatigue.  ?HENT:  Positive for sinus pain.   ?Eyes:  Positive for double vision.  ?Respiratory:  Positive for shortness of breath.   ?Cardiovascular: Negative.   ?Gastrointestinal:  Positive for heartburn.  ?Genitourinary:  Positive for frequency.  ?Musculoskeletal: Negative.   ?Skin: Negative.   ?Neurological: Negative.   ?Endo/Heme/Allergies:  Bruises/bleeds easily.  ?Psychiatric/Behavioral: Negative.    ? ?Objective  ? ?Vitals:  ? 07/03/21 1414  ?BP: 125/79  ?Pulse: 83  ?Resp: 12  ?Temp: (!) 97.4 ?F (36.3 ?C)  ?SpO2: 95%  ? ? ?Physical Exam ?Vitals reviewed.  ?Constitutional:   ?   Appearance: Normal appearance. He is normal weight. He is not ill-appearing.  ?HENT:  ?   Head: Normocephalic and atraumatic.  ?Cardiovascular:  ?   Rate and Rhythm: Normal rate and regular rhythm.  ?   Heart sounds: Normal heart sounds. No murmur heard. ?  No friction rub. No gallop.  ?Pulmonary:  ?   Effort: Pulmonary effort is normal. No respiratory distress.  ?   Breath sounds: Normal breath sounds. No stridor. No wheezing, rhonchi or rales.  ?Skin: ?   General: Skin is warm and dry.  ?Neurological:  ?   Mental Status: He is alert and oriented to person, place, and time.  ? ?Dr. Tomie China notes reviewed ?Assessment  ?Myelodysplastic syndrome, need for central venous access, thrombocytopenia with latest platelet count 85,000 ?Plan  ?Patient is scheduled for Port-A-Cath insertion on 07/09/2021 on the right side.  He will have blood work today by oncology to assess his platelet count.  The risks and benefits of the  procedure including bleeding, infection, and the possibility of a pneumothorax were fully explained to the patient, who gave informed consent. ?

## 2021-07-03 NOTE — H&P (Signed)
Chad Lamb; [id number]; 1945/05/21 ? ? ?HPI ?Patient is a 76 year old white male who was referred to my care by Dr. Delton Coombes for Port-A-Cath placement.  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  His history is significant for a DVT of the left arm for which she was taking Xarelto.  This was started in 2017.  He was recently taken off the Xarelto by Dr. Delton Coombes due to thrombocytopenia. ?Past Medical History:  ?Diagnosis Date  ? Allergy   ? Anxiety   ? Arthritis   ? Asthma   ? Cataract   ? Clotting disorder ([place])   ? DVT (deep venous thrombosis) ([place])   ? unknown etiology. [facility]  ? GERD (gastroesophageal reflux disease)   ? History of dental surgery   ? feb 2023  ? Hyperlipidemia   ? Hypertension   ? MDS (myelodysplastic syndrome), high grade ([place]) 06/17/2021  ? Pneumonia   ? ? ?Past Surgical History:  ?Procedure Laterality Date  ? CATARACT EXTRACTION W/PHACO Right 11/24/2017  ? Procedure: CATARACT EXTRACTION PHACO AND INTRAOCULAR LENS PLACEMENT RIGHT EYE;  Surgeon: [name], MD;  [facility]: [facility];  Service: Ophthalmology;  Laterality: Right;  CDE: 8.61  ? CATARACT EXTRACTION W/PHACO Left 12/29/2017  ? Procedure: CATARACT EXTRACTION PHACO AND INTRAOCULAR LENS PLACEMENT (IOC);  Surgeon: [name], MD;  [facility]: [facility];  Service: Ophthalmology;  Laterality: Left;  CDE: 7.21  ? GANGLION CYST EXCISION    ? HERNIA REPAIR    ? bilateral  ? PARS PLANA VITRECTOMY Left 05/17/2021  ? Procedure: PARS PLANA VITRECTOMY 25 GAUGE WITH ANTIBIOTIC INJECTION  AND ENDOLASER LEFT EYE;  Surgeon: [name], MD;  Location: [place];  Service: Ophthalmology;  Laterality: Left;  ? TONSILLECTOMY    ? VASCULAR SURGERY    ? ? ?Family History  ?Problem Relation Age of Onset  ? Cancer Mother   ?     pancreatic  ? Arthritis Father   ? Early death Father 11  ?     Lung infiltrate  ? Colon cancer Neg Hx   ? Colon polyps Neg Hx   ? ? ?Current Outpatient Medications on File Prior to Visit  ?Medication Sig  Dispense Refill  ? albuterol (PROVENTIL HFA;VENTOLIN HFA) 108 (90 Base) MCG/ACT inhaler Inhale 1-2 puffs into the lungs every 6 (six) hours as needed for wheezing or shortness of breath.    ? ALPRAZolam (XANAX) 0.5 MG tablet Take by mouth 2 (two) times daily as needed.     ? amLODipine (NORVASC) 2.5 MG tablet Take 2.5 mg by mouth daily.    ? fluticasone furoate-vilanterol (BREO ELLIPTA) 200-25 MCG/ACT AEPB Inhale 1 puff into the lungs daily.    ? Lidocaine-Hydrocortisone Ace 3-0.5 % CREA Apply 1 application topically as directed. 30 Tube 11  ? loratadine (CLARITIN) 10 MG tablet Take 10 mg by mouth daily.    ? Lutein 40 MG CAPS Take by mouth daily.    ? Multiple Vitamins-Minerals (CENTRUM ADULTS PO) Take 1 tablet by mouth daily.    ? ofloxacin (OCUFLOX) 0.3 % ophthalmic solution 1 drop 4 (four) times daily. 1 drop to left eye 4 x daily for 7 days after monthly procedure.    ? omeprazole (PRILOSEC OTC) 20 MG tablet 1/4-1/2 tablet 30 minutes before morning meal    ? rivaroxaban (XARELTO) 20 MG TABS tablet Take 20 mg by mouth daily with supper.    ? sildenafil (VIAGRA) 50 MG tablet Take 50 mg by mouth daily  as needed for erectile dysfunction.     ? ?No current facility-administered medications on file prior to visit.  ? ? ?Allergies  ?Allergen Reactions  ? Cephalosporins Itching  ? Amoxapine And Related Itching and Rash  ?  Has patient had a PCN reaction causing immediate rash, facial/tongue/throat swelling, SOB or lightheadedness with hypotension: No ?Has patient had a PCN reaction causing severe rash involving mucus membranes or skin necrosis: No ?Has patient had a PCN reaction that required hospitalization: No ?Has patient had a PCN reaction occurring within the last 10 years: No ?If all of the above answers are "NO", then may proceed with Cephalosporin use. ? ?  ? Amoxicillin Rash  ? Cephalexin Rash  ? Clindamycin Rash  ? Clindamycin/Lincomycin Itching and Rash  ? Sulfamethoxazole Rash  ? Sulfamethoxazole Itching  and Rash  ? Trimethoprim Rash  ? ? ?Social History  ? ?Substance and Sexual Activity  ?Alcohol Use Yes  ? Alcohol/week: 1.0 standard drink  ? Types: 1 Cans of beer per week  ? Comment: 1 can of beer a week  ? ? ?Social History  ? ?Tobacco Use  ?Smoking Status Former  ? Types: Pipe  ? Quit date: 03/21/1966  ? Years since quitting: 55.3  ?Smokeless Tobacco Never  ? ? ?Review of Systems  ?Constitutional:  Positive for malaise/fatigue.  ?HENT:  Positive for sinus pain.   ?Eyes:  Positive for double vision.  ?Respiratory:  Positive for shortness of breath.   ?Cardiovascular: Negative.   ?Gastrointestinal:  Positive for heartburn.  ?Genitourinary:  Positive for frequency.  ?Musculoskeletal: Negative.   ?Skin: Negative.   ?Neurological: Negative.   ?Endo/Heme/Allergies:  Bruises/bleeds easily.  ?Psychiatric/Behavioral: Negative.    ? ?Objective  ? ?Vitals:  ? 07/03/21 1414  ?BP: 125/79  ?Pulse: 83  ?Resp: 12  ?Temp: (!) 97.4 ?F (36.3 ?C)  ?SpO2: 95%  ? ? ?Physical Exam ?Vitals reviewed.  ?Constitutional:   ?   Appearance: Normal appearance. He is normal weight. He is not ill-appearing.  ?HENT:  ?   Head: Normocephalic and atraumatic.  ?Cardiovascular:  ?   Rate and Rhythm: Normal rate and regular rhythm.  ?   Heart sounds: Normal heart sounds. No murmur heard. ?  No friction rub. No gallop.  ?Pulmonary:  ?   Effort: Pulmonary effort is normal. No respiratory distress.  ?   Breath sounds: Normal breath sounds. No stridor. No wheezing, rhonchi or rales.  ?Skin: ?   General: Skin is warm and dry.  ?Neurological:  ?   Mental Status: He is alert and oriented to person, place, and time.  ? ?Dr. Tomie China notes reviewed ?Assessment  ?Myelodysplastic syndrome, need for central venous access, thrombocytopenia with latest platelet count 85,000 ?Plan  ?Patient is scheduled for Port-A-Cath insertion on 07/09/2021 on the right side.  He will have blood work today by oncology to assess his platelet count.  The risks and benefits of the  procedure including bleeding, infection, and the possibility of a pneumothorax were fully explained to the patient, who gave informed consent. ?

## 2021-07-04 LAB — MISC LABCORP TEST (SEND OUT): Labcorp test code: 451953

## 2021-07-06 ENCOUNTER — Other Ambulatory Visit: Payer: Self-pay

## 2021-07-06 ENCOUNTER — Encounter (HOSPITAL_COMMUNITY): Payer: Self-pay

## 2021-07-06 ENCOUNTER — Encounter (HOSPITAL_COMMUNITY)
Admission: RE | Admit: 2021-07-06 | Discharge: 2021-07-06 | Disposition: A | Discharge status: Home / Self Care | Payer: Medicare HMO | Source: Ambulatory Visit | Attending: General Surgery | Admitting: General Surgery

## 2021-07-06 NOTE — Pre-Procedure Instructions (Signed)
Attempted pre-op phone call. Left message for him to call us back. ?

## 2021-07-09 ENCOUNTER — Ambulatory Visit (HOSPITAL_COMMUNITY): Payer: Medicare HMO

## 2021-07-09 ENCOUNTER — Ambulatory Visit (HOSPITAL_COMMUNITY)
Admission: RE | Admit: 2021-07-09 | Discharge: 2021-07-09 | Disposition: A | Discharge status: Home / Self Care | Payer: Medicare HMO | Attending: General Surgery | Admitting: General Surgery

## 2021-07-09 ENCOUNTER — Encounter (HOSPITAL_COMMUNITY): Payer: Self-pay | Admitting: General Surgery

## 2021-07-09 ENCOUNTER — Ambulatory Visit (HOSPITAL_BASED_OUTPATIENT_CLINIC_OR_DEPARTMENT_OTHER): Payer: Medicare HMO | Admitting: Anesthesiology

## 2021-07-09 ENCOUNTER — Encounter (HOSPITAL_COMMUNITY)
Admission: RE | Disposition: A | Discharge status: Home / Self Care | Payer: Self-pay | Source: Home / Self Care | Attending: General Surgery

## 2021-07-09 ENCOUNTER — Other Ambulatory Visit: Payer: Self-pay

## 2021-07-09 ENCOUNTER — Ambulatory Visit (HOSPITAL_COMMUNITY): Payer: Medicare HMO | Admitting: Anesthesiology

## 2021-07-09 DIAGNOSIS — D469 Myelodysplastic syndrome, unspecified: Secondary | ICD-10-CM

## 2021-07-09 DIAGNOSIS — Z7951 Long term (current) use of inhaled steroids: Secondary | ICD-10-CM | POA: Diagnosis not present

## 2021-07-09 DIAGNOSIS — F419 Anxiety disorder, unspecified: Secondary | ICD-10-CM

## 2021-07-09 DIAGNOSIS — I10 Essential (primary) hypertension: Secondary | ICD-10-CM

## 2021-07-09 DIAGNOSIS — K219 Gastro-esophageal reflux disease without esophagitis: Secondary | ICD-10-CM | POA: Diagnosis not present

## 2021-07-09 DIAGNOSIS — Z79899 Other long term (current) drug therapy: Secondary | ICD-10-CM | POA: Diagnosis not present

## 2021-07-09 DIAGNOSIS — J45909 Unspecified asthma, uncomplicated: Secondary | ICD-10-CM | POA: Diagnosis not present

## 2021-07-09 DIAGNOSIS — Z86718 Personal history of other venous thrombosis and embolism: Secondary | ICD-10-CM | POA: Diagnosis not present

## 2021-07-09 DIAGNOSIS — Z87891 Personal history of nicotine dependence: Secondary | ICD-10-CM

## 2021-07-09 DIAGNOSIS — Z452 Encounter for adjustment and management of vascular access device: Secondary | ICD-10-CM | POA: Insufficient documentation

## 2021-07-09 DIAGNOSIS — J45998 Other asthma: Secondary | ICD-10-CM | POA: Diagnosis not present

## 2021-07-09 DIAGNOSIS — M1990 Unspecified osteoarthritis, unspecified site: Secondary | ICD-10-CM | POA: Diagnosis not present

## 2021-07-09 DIAGNOSIS — E785 Hyperlipidemia, unspecified: Secondary | ICD-10-CM | POA: Insufficient documentation

## 2021-07-09 HISTORY — PX: PORTACATH PLACEMENT: SHX2246

## 2021-07-09 SURGERY — INSERTION, TUNNELED CENTRAL VENOUS DEVICE, WITH PORT
Anesthesia: General | Site: Chest | Laterality: Right

## 2021-07-09 MED ORDER — PROPOFOL 10 MG/ML IV BOLUS
INTRAVENOUS | Status: AC
  Filled 2021-07-09: qty 20

## 2021-07-09 MED ORDER — FENTANYL CITRATE (PF) 100 MCG/2ML IJ SOLN
INTRAMUSCULAR | Status: AC
  Filled 2021-07-09: qty 2

## 2021-07-09 MED ORDER — PROPOFOL 500 MG/50ML IV EMUL
INTRAVENOUS | Status: AC
  Filled 2021-07-09: qty 50

## 2021-07-09 MED ORDER — PROPOFOL 500 MG/50ML IV EMUL
INTRAVENOUS | Status: DC | PRN
  Administered 2021-07-09: 60 ug/kg/min via INTRAVENOUS

## 2021-07-09 MED ORDER — CHLORHEXIDINE GLUCONATE CLOTH 2 % EX PADS: 6.0000 | MEDICATED_PAD | Freq: Once | CUTANEOUS | Status: DC

## 2021-07-09 MED ORDER — ONDANSETRON HCL 4 MG/2ML IJ SOLN
INTRAMUSCULAR | Status: DC | PRN
  Administered 2021-07-09: 4 mg via INTRAVENOUS

## 2021-07-09 MED ORDER — HEPARIN SOD (PORK) LOCK FLUSH 100 UNIT/ML IV SOLN
INTRAVENOUS | Status: DC | PRN
  Administered 2021-07-09: 500 [IU] via INTRAVENOUS

## 2021-07-09 MED ORDER — HEPARIN SOD (PORK) LOCK FLUSH 100 UNIT/ML IV SOLN
INTRAVENOUS | Status: AC
  Filled 2021-07-09: qty 5

## 2021-07-09 MED ORDER — LACTATED RINGERS IV SOLN: INTRAVENOUS | Status: DC

## 2021-07-09 MED ORDER — ONDANSETRON HCL 4 MG/2ML IJ SOLN: 4.0000 mg | Freq: Once | INTRAMUSCULAR | Status: DC | PRN

## 2021-07-09 MED ORDER — FENTANYL CITRATE PF 50 MCG/ML IJ SOSY: 25.0000 ug | PREFILLED_SYRINGE | INTRAMUSCULAR | Status: DC | PRN

## 2021-07-09 MED ORDER — CHLORHEXIDINE GLUCONATE 0.12 % MT SOLN
15.0000 mL | Freq: Once | OROMUCOSAL | Status: AC
  Administered 2021-07-09: 15 mL via OROMUCOSAL

## 2021-07-09 MED ORDER — VANCOMYCIN HCL IN DEXTROSE 1-5 GM/200ML-% IV SOLN
1000.0000 mg | INTRAVENOUS | Status: AC
  Administered 2021-07-09: 1000 mg via INTRAVENOUS
  Filled 2021-07-09: qty 200

## 2021-07-09 MED ORDER — LIDOCAINE HCL (PF) 1 % IJ SOLN
INTRAMUSCULAR | Status: DC | PRN
  Administered 2021-07-09: 6 mL

## 2021-07-09 MED ORDER — LIDOCAINE 2% (20 MG/ML) 5 ML SYRINGE
INTRAMUSCULAR | Status: DC | PRN
  Administered 2021-07-09: 80 mg via INTRAVENOUS

## 2021-07-09 MED ORDER — FENTANYL CITRATE (PF) 100 MCG/2ML IJ SOLN
INTRAMUSCULAR | Status: DC | PRN
  Administered 2021-07-09 (×2): 25 ug via INTRAVENOUS

## 2021-07-09 MED ORDER — ONDANSETRON HCL 4 MG/2ML IJ SOLN
INTRAMUSCULAR | Status: AC
  Filled 2021-07-09: qty 2

## 2021-07-09 MED ORDER — LIDOCAINE HCL (PF) 2 % IJ SOLN
INTRAMUSCULAR | Status: AC
  Filled 2021-07-09: qty 5

## 2021-07-09 MED ORDER — ORAL CARE MOUTH RINSE: 15.0000 mL | Freq: Once | OROMUCOSAL | Status: AC

## 2021-07-09 MED ORDER — PROPOFOL 10 MG/ML IV BOLUS
INTRAVENOUS | Status: DC | PRN
Start: 2021-07-09 — End: 2021-07-09
  Administered 2021-07-09 (×2): 20 mg via INTRAVENOUS

## 2021-07-09 MED ORDER — SODIUM CHLORIDE (PF) 0.9 % IJ SOLN
INTRAMUSCULAR | Status: DC | PRN
  Administered 2021-07-09: 500 mL

## 2021-07-09 MED ORDER — LIDOCAINE HCL (PF) 1 % IJ SOLN
INTRAMUSCULAR | Status: AC
  Filled 2021-07-09: qty 30

## 2021-07-09 MED ORDER — TRAMADOL HCL 50 MG PO TABS: 50.0000 mg | ORAL_TABLET | Freq: Four times a day (QID) | ORAL | 0 refills | Status: AC | PRN

## 2021-07-09 MED ORDER — SEVOFLURANE IN SOLN
RESPIRATORY_TRACT | Status: AC
  Filled 2021-07-09: qty 250

## 2021-07-09 SURGICAL SUPPLY — 29 items
APPLICATOR CHLORAPREP 10.5 ORG (MISCELLANEOUS) ×2 IMPLANT
BAG DECANTER FOR FLEXI CONT (MISCELLANEOUS) ×2 IMPLANT
CLOTH BEACON ORANGE TIMEOUT ST (SAFETY) ×2 IMPLANT
COVER LIGHT HANDLE STERIS (MISCELLANEOUS) ×4 IMPLANT
DECANTER SPIKE VIAL GLASS SM (MISCELLANEOUS) ×2 IMPLANT
DERMABOND ADVANCED (GAUZE/BANDAGES/DRESSINGS) ×1
DERMABOND ADVANCED .7 DNX12 (GAUZE/BANDAGES/DRESSINGS) ×1 IMPLANT
DRAPE C-ARM FOLDED MOBILE STRL (DRAPES) ×2 IMPLANT
ELECT REM PT RETURN 9FT ADLT (ELECTROSURGICAL) ×2
ELECTRODE REM PT RTRN 9FT ADLT (ELECTROSURGICAL) ×1 IMPLANT
GLOVE BIOGEL PI IND STRL 7.0 (GLOVE) ×2 IMPLANT
GLOVE BIOGEL PI INDICATOR 7.0 (GLOVE) ×2
GLOVE ECLIPSE 6.5 STRL STRAW (GLOVE) ×1 IMPLANT
GLOVE SURG SS PI 7.5 STRL IVOR (GLOVE) ×2 IMPLANT
GOWN STRL REUS W/TWL LRG LVL3 (GOWN DISPOSABLE) ×4 IMPLANT
IV NS 500ML (IV SOLUTION) ×1
IV NS 500ML BAXH (IV SOLUTION) ×1 IMPLANT
KIT PORT POWER 8FR ISP MRI (Port) ×2 IMPLANT
KIT TURNOVER KIT A (KITS) ×2 IMPLANT
NDL HYPO 25X1 1.5 SAFETY (NEEDLE) ×1 IMPLANT
NEEDLE HYPO 25X1 1.5 SAFETY (NEEDLE) ×2 IMPLANT
PACK MINOR (CUSTOM PROCEDURE TRAY) ×2 IMPLANT
PAD ARMBOARD 7.5X6 YLW CONV (MISCELLANEOUS) ×2 IMPLANT
SET BASIN LINEN APH (SET/KITS/TRAYS/PACK) ×2 IMPLANT
SUT MNCRL AB 4-0 PS2 18 (SUTURE) ×2 IMPLANT
SUT VIC AB 3-0 SH 27 (SUTURE) ×1
SUT VIC AB 3-0 SH 27X BRD (SUTURE) ×1 IMPLANT
SYR 5ML LL (SYRINGE) ×2 IMPLANT
SYR CONTROL 10ML LL (SYRINGE) ×2 IMPLANT

## 2021-07-09 NOTE — Anesthesia Preprocedure Evaluation (Signed)
Anesthesia Evaluation  ?Patient identified by MRN, date of birth, ID band ?Patient awake ? ? ? ?Reviewed: ?Allergy & Precautions, NPO status , Patient's Chart, lab work & pertinent test results ? ?Airway ?Mallampati: I ? ?TM Distance: >3 FB ?Neck ROM: Full ? ? ? Dental ? ?(+) Dental Advisory Given, Missing, Chipped,  ?  ?Pulmonary ?asthma , pneumonia, former smoker, PE ?  ?Pulmonary exam normal ?breath sounds clear to auscultation ? ? ? ? ? ? Cardiovascular ?hypertension, Pt. on medications ?+ DVT  ?Normal cardiovascular exam ?Rhythm:Regular Rate:Normal ? ? ?  ?Neuro/Psych ?PSYCHIATRIC DISORDERS Anxiety   ? GI/Hepatic ?GERD  Medicated,  ?Endo/Other  ? ? Renal/GU ?  ? ?  ?Musculoskeletal ? ?(+) Arthritis , Osteoarthritis,   ? Abdominal ?  ?Peds ? Hematology ? ?(+) Blood dyscrasia (myelodysplastic syndrome), ,   ?Anesthesia Other Findings ? ? Reproductive/Obstetrics ? ?  ? ? ? ? ? ? ? ? ? ? ? ? ? ?  ?  ? ? ? ? ? ? ? ?Anesthesia Physical ?Anesthesia Plan ? ?ASA: 3 ? ?Anesthesia Plan: General  ? ?Post-op Pain Management: Minimal or no pain anticipated  ? ?Induction: Intravenous ? ?PONV Risk Score and Plan: 2 and TIVA ? ?Airway Management Planned: Nasal Cannula and Natural Airway ? ?Additional Equipment:  ? ?Intra-op Plan:  ? ?Post-operative Plan:  ? ?Informed Consent: I have reviewed the patients History and Physical, chart, labs and discussed the procedure including the risks, benefits and alternatives for the proposed anesthesia with the patient or authorized representative who has indicated his/her understanding and acceptance.  ? ? ? ?Dental advisory given ? ?Plan Discussed with: CRNA and Surgeon ? ?Anesthesia Plan Comments:   ? ? ? ? ? ? ?Anesthesia Quick Evaluation ? ?

## 2021-07-09 NOTE — Transfer of Care (Signed)
Immediate Anesthesia Transfer of Care Note ? ?Patient: Chad Lamb ? ?Procedure(s) Performed: INSERTION PORT-A-CATH (Right: Chest) ? ?Patient Location: PACU ? ?Anesthesia Type:General ? ?Level of Consciousness: awake, alert  and oriented ? ?Airway & Oxygen Therapy: Patient Spontanous Breathing ? ?Post-op Assessment: Report given to RN and Post -op Vital signs reviewed and stable ? ?Post vital signs: Reviewed and stable ? ?Last Vitals:  ?Vitals Value Taken Time  ?BP 118/87 07/09/21 0747  ?Temp    ?Pulse 67 07/09/21 0748  ?Resp 11 07/09/21 0748  ?SpO2 96 % 07/09/21 0748  ?Vitals shown include unvalidated device data. ? ?Last Pain:  ?Vitals:  ? 07/09/21 0710  ?TempSrc: Oral  ?PainSc: 0-No pain  ?   ? ?Patients Stated Pain Goal: 5 (07/09/21 0710) ? ?Complications: No notable events documented. ?

## 2021-07-09 NOTE — Op Note (Signed)
Patient:  Chad Lamb ? ?DOB:  06-Nov-1945 ? ?MRN:  [id number] ? ? ?Preop Diagnosis: Myelodysplastic syndrome, need for central venous access ? ?Postop Diagnosis: Same ? ?Procedure: Port-A-Cath insertion ? ?Lamb: Aviva Signs, MD ? ?Anes: MAC ? ?Indications: Patient is a 76 year old white male with myelodysplastic syndrome who needs central venous access for chemotherapy.  The risks and benefits of the procedure including bleeding, infection, and pneumothorax were fully explained to the patient, who gave informed consent. ? ?Procedure note: The patient was placed in the Trendelenburg position after the right upper chest was prepped and draped using the usual sterile technique with ChloraPrep.  Surgical site confirmation was performed. ? ?1% Xylocaine was used for local anesthesia.  An incision was made below the right clavicle.  A subcutaneous pocket was formed.  A needle was advanced into the right subclavian vein using the Seldinger technique without difficulty.  The guidewire was then advanced into the right atrium under fluoroscopic guidance.  An introducer and peel-away sheath were placed over the guidewire.  The catheter was inserted through the peel-away sheath and the peel-away sheath was removed.  The catheter was then attached to the port and the port placed in subcutaneous pocket.  Adequate positioning was confirmed by fluoroscopy.  Good backflow of venous blood was noted on aspiration of the port.  The port was flushed with heparin flush.  The subcutaneous layer was reapproximated using a 3-0 Vicryl interrupted suture.  The skin was closed using a 4-0 Monocryl subcuticular suture.  Dermabond was applied. ? ?All tape and needle counts were correct at the end of the procedure.  The patient was awakened and transferred to PACU in stable condition.  A chest x-ray will be performed at that time. ? ?Complications: None ? ?EBL: Minimal ? ?Specimen: None ? ? ?  ?

## 2021-07-09 NOTE — Anesthesia Postprocedure Evaluation (Signed)
Anesthesia Post Note ? ?Patient: Chad Lamb ? ?Procedure(s) Performed: INSERTION PORT-A-CATH (Right: Chest) ? ?Patient location during evaluation: Phase II ?Anesthesia Type: General ?Level of consciousness: awake and alert and oriented ?Pain management: pain level controlled ?Vital Signs Assessment: post-procedure vital signs reviewed and stable ?Respiratory status: spontaneous breathing, nonlabored ventilation and respiratory function stable ?Cardiovascular status: blood pressure returned to baseline and stable ?Postop Assessment: no apparent nausea or vomiting ?Anesthetic complications: no ? ? ?No notable events documented. ? ? ?Last Vitals:  ?Vitals:  ? 07/09/21 0815 07/09/21 0832  ?BP: 121/80 (!) 135/93  ?Pulse: 62 60  ?Resp: 11 14  ?Temp:  36.5 ?C  ?SpO2: 97% 100%  ?  ?Last Pain:  ?Vitals:  ? 07/09/21 0832  ?TempSrc: Oral  ?PainSc: 0-No pain  ? ? ?  ?  ?  ?  ?  ?  ? ?Chad Lamb C Efraim Vanallen ? ? ? ? ?

## 2021-07-09 NOTE — Interval H&P Note (Signed)
History and Physical Interval Note: ? ?07/09/2021 ?6:53 AM ? ?Chad Lamb  has presented today for surgery, with the diagnosis of MDS, high grade.  The various methods of treatment have been discussed with the patient and family. After consideration of risks, benefits and other options for treatment, the patient has consented to  Procedure(s): ?INSERTION PORT-A-CATH (Right) as a surgical intervention.  The patient's history has been reviewed, patient examined, no change in status, stable for surgery.  I have reviewed the patient's chart and labs.  Questions were answered to the patient's satisfaction.   ? ? ?Aviva Signs ? ? ?

## 2021-07-09 NOTE — Anesthesia Procedure Notes (Signed)
Date/Time: 07/09/2021 7:32 AM ?Performed by: Orlie Dakin, CRNA ?Pre-anesthesia Checklist: Patient identified, Emergency Drugs available, Suction available and Patient being monitored ?Patient Re-evaluated:Patient Re-evaluated prior to induction ?Oxygen Delivery Method: Nasal cannula ?Induction Type: IV induction ?Placement Confirmation: positive ETCO2 ? ? ? ? ?

## 2021-07-10 ENCOUNTER — Encounter (HOSPITAL_COMMUNITY): Payer: Self-pay | Admitting: General Surgery

## 2021-07-11 ENCOUNTER — Encounter: Payer: Self-pay | Admitting: *Deleted

## 2021-07-11 ENCOUNTER — Telehealth: Payer: Self-pay | Admitting: *Deleted

## 2021-07-11 NOTE — Telephone Encounter (Signed)
Received call from patient [phone]~ telephone.  ? ?Surgical Date: 07/09/2021 ?Procedure: Port- A- Cath Insertion, Right Chest ? ?Patient reports that he has discoloration to right side of chest from port insertion site to nipple. States that discoloration is dark red at insertion site and then extends down chest that is yellowish brown in color.  ? ?Patient unable to upload picture to [place]. Patient was able to e-mail picture.  ? ?Noted that discoloration is slight and appears to be as expected after placement. Advised that discoloration can darken during healing stages before being reabsorbed by the body.  ? ?Patient denies fever, chills, pain, or drainage. Advised to contact if any other concerns arise.  ?

## 2021-07-19 ENCOUNTER — Telehealth: Payer: Self-pay | Admitting: *Deleted

## 2021-07-19 NOTE — Telephone Encounter (Signed)
Received call from patient [phone]~ telephone.  ?  ?Surgical Date: 07/09/2021 ?Procedure: Port- A- Cath Insertion, Right Chest ? ?Inquired as to when he can resume swimming for exercise. Per Dr. Arnoldo Morale, he can resume swimming 10 days after insertion as long as skin is intact.  ? ?Call placed to patient. [place].  ?

## 2021-07-19 NOTE — Telephone Encounter (Signed)
Patient returned call and made aware.  ? ?Verbalized understanding.  ?

## 2021-07-30 ENCOUNTER — Encounter (HOSPITAL_COMMUNITY): Payer: Self-pay | Admitting: Hematology

## 2021-07-30 ENCOUNTER — Inpatient Hospital Stay (HOSPITAL_COMMUNITY): Payer: Medicare HMO

## 2021-07-30 ENCOUNTER — Other Ambulatory Visit: Payer: Self-pay

## 2021-07-30 ENCOUNTER — Inpatient Hospital Stay (HOSPITAL_COMMUNITY): Payer: Medicare HMO | Attending: Hematology | Admitting: Hematology

## 2021-07-30 ENCOUNTER — Encounter (HOSPITAL_COMMUNITY): Payer: Self-pay

## 2021-07-30 ENCOUNTER — Other Ambulatory Visit (HOSPITAL_COMMUNITY): Payer: Self-pay | Admitting: *Deleted

## 2021-07-30 VITALS — BP 128/75 | HR 66 | Temp 98.0°F | Resp 18 | Ht 70.08 in | Wt 156.0 lb

## 2021-07-30 VITALS — BP 152/83 | HR 62 | Temp 97.5°F | Resp 18

## 2021-07-30 DIAGNOSIS — Z5111 Encounter for antineoplastic chemotherapy: Secondary | ICD-10-CM | POA: Insufficient documentation

## 2021-07-30 DIAGNOSIS — D4622 Refractory anemia with excess of blasts 2: Secondary | ICD-10-CM | POA: Diagnosis not present

## 2021-07-30 DIAGNOSIS — D469 Myelodysplastic syndrome, unspecified: Secondary | ICD-10-CM

## 2021-07-30 DIAGNOSIS — D46Z Other myelodysplastic syndromes: Secondary | ICD-10-CM | POA: Diagnosis not present

## 2021-07-30 DIAGNOSIS — D696 Thrombocytopenia, unspecified: Secondary | ICD-10-CM

## 2021-07-30 DIAGNOSIS — Z95828 Presence of other vascular implants and grafts: Secondary | ICD-10-CM

## 2021-07-30 HISTORY — DX: Presence of other vascular implants and grafts: Z95.828

## 2021-07-30 LAB — CBC WITH DIFFERENTIAL/PLATELET
Abs Immature Granulocytes: 0.01 10*3/uL (ref 0.00–0.07)
Basophils Absolute: 0 10*3/uL (ref 0.0–0.1)
Basophils Relative: 1 %
Eosinophils Absolute: 0.1 10*3/uL (ref 0.0–0.5)
Eosinophils Relative: 5 %
HCT: 30.4 % — ABNORMAL LOW (ref 39.0–52.0)
Hemoglobin: 10.2 g/dL — ABNORMAL LOW (ref 13.0–17.0)
Immature Granulocytes: 1 %
Lymphocytes Relative: 61 %
Lymphs Abs: 1.2 10*3/uL (ref 0.7–4.0)
MCH: 32.7 pg (ref 26.0–34.0)
MCHC: 33.6 g/dL (ref 30.0–36.0)
MCV: 97.4 fL (ref 80.0–100.0)
Monocytes Absolute: 0.4 10*3/uL (ref 0.1–1.0)
Monocytes Relative: 17 %
Neutro Abs: 0.3 10*3/uL — CL (ref 1.7–7.7)
Neutrophils Relative %: 15 %
Platelets: 96 10*3/uL — ABNORMAL LOW (ref 150–400)
RBC: 3.12 MIL/uL — ABNORMAL LOW (ref 4.22–5.81)
RDW: 14.4 % (ref 11.5–15.5)
WBC: 1.9 10*3/uL — ABNORMAL LOW (ref 4.0–10.5)
nRBC: 0 % (ref 0.0–0.2)

## 2021-07-30 LAB — COMPREHENSIVE METABOLIC PANEL
ALT: 13 U/L (ref 0–44)
AST: 19 U/L (ref 15–41)
Albumin: 3.8 g/dL (ref 3.5–5.0)
Alkaline Phosphatase: 62 U/L (ref 38–126)
Anion gap: 3 — ABNORMAL LOW (ref 5–15)
BUN: 14 mg/dL (ref 8–23)
CO2: 27 mmol/L (ref 22–32)
Calcium: 9 mg/dL (ref 8.9–10.3)
Chloride: 109 mmol/L (ref 98–111)
Creatinine, Ser: 0.86 mg/dL (ref 0.61–1.24)
GFR, Estimated: >60 mL/min (ref >60)
Glucose, Bld: 120 mg/dL — ABNORMAL HIGH (ref 70–99)
Potassium: 3.5 mmol/L (ref 3.5–5.1)
Sodium: 139 mmol/L (ref 135–145)
Total Bilirubin: 0.9 mg/dL (ref 0.3–1.2)
Total Protein: 6.3 g/dL — ABNORMAL LOW (ref 6.5–8.1)

## 2021-07-30 LAB — MAGNESIUM: Magnesium: 2.3 mg/dL (ref 1.7–2.4)

## 2021-07-30 MED ORDER — PROCHLORPERAZINE MALEATE 10 MG PO TABS: 10.0000 mg | ORAL_TABLET | Freq: Four times a day (QID) | ORAL | 1 refills | Status: DC | PRN

## 2021-07-30 MED ORDER — HEPARIN SOD (PORK) LOCK FLUSH 100 UNIT/ML IV SOLN
500.0000 [IU] | Freq: Once | INTRAVENOUS | Status: AC | PRN
  Administered 2021-07-30: 500 [IU]

## 2021-07-30 MED ORDER — PALONOSETRON HCL INJECTION 0.25 MG/5ML
0.2500 mg | Freq: Once | INTRAVENOUS | Status: AC
  Administered 2021-07-30: 0.25 mg via INTRAVENOUS
  Filled 2021-07-30: qty 5

## 2021-07-30 MED ORDER — SODIUM CHLORIDE 0.9% FLUSH
10.0000 mL | INTRAVENOUS | Status: DC | PRN
  Administered 2021-07-30: 10 mL

## 2021-07-30 MED ORDER — LIDOCAINE-PRILOCAINE 2.5-2.5 % EX CREA: TOPICAL_CREAM | CUTANEOUS | 3 refills | Status: DC

## 2021-07-30 MED ORDER — SODIUM CHLORIDE 0.9 % IV SOLN
55.0000 mg/m2 | Freq: Once | INTRAVENOUS | Status: AC
  Administered 2021-07-30: 100 mg via INTRAVENOUS
  Filled 2021-07-30: qty 10

## 2021-07-30 MED ORDER — SODIUM CHLORIDE 0.9 % IV SOLN
10.0000 mg | Freq: Once | INTRAVENOUS | Status: AC
  Administered 2021-07-30: 10 mg via INTRAVENOUS
  Filled 2021-07-30: qty 1

## 2021-07-30 MED ORDER — SODIUM CHLORIDE 0.9 % IV SOLN: Freq: Once | INTRAVENOUS | Status: AC

## 2021-07-30 NOTE — Patient Instructions (Signed)
[place] at [facility] ?Discharge Instructions ? ? ?You were seen and examined today by Dr. Delton Coombes. ? ?He discussed the treatment plan with you. We will treat you in the clinic on Days 1-7 every 28 days.  ? ? ?Thank you for choosing [place] at [facility] to provide your oncology and hematology care.  To afford each patient quality time with our provider, please arrive at least 15 minutes before your scheduled appointment time.  ? ?If you have a lab appointment with the [facility] please come in thru the Main Entrance and check in at the main information desk. ? ?You need to re-schedule your appointment should you arrive 10 or more minutes late.  We strive to give you quality time with our providers, and arriving late affects you and other patients whose appointments are after yours.  Also, if you no show three or more times for appointments you may be dismissed from the clinic at the providers discretion.     ?Again, thank you for choosing [facility].  Our hope is that these requests will decrease the amount of time that you wait before being seen by our physicians.       ?_____________________________________________________________ ? ?Should you have questions after your visit to [facility], please contact our office at [phone] and follow the prompts.  Our office hours are 8:00 a.m. and 4:30 p.m. Monday - Friday.  Please note that voicemails left after 4:00 p.m. may not be returned until the following business day.  We are closed weekends and major holidays.  You do have access to a nurse 24-7, just call the main number to the clinic [phone] and do not press any options, hold on the line and a nurse will answer the phone.   ? ?For prescription refill requests, have your pharmacy contact our office and allow 72 hours.   ? ?Due to Covid, you will need to wear a mask upon entering the hospital. If you do not have a  mask, a mask will be given to you at the Main Entrance upon arrival. For doctor visits, patients may have 1 support person age 58 or older with them. For treatment visits, patients can not have anyone with them due to social distancing guidelines and our immunocompromised population.  ? ?   ?

## 2021-07-30 NOTE — Progress Notes (Signed)
CRITICAL VALUE ALERT ?Critical value received:  ANC 0.3 ?Date of notification:  07-30-21 ?Time of notification: [phone] ?Critical value read back:  Yes.   ?Nurse who received alert:  C. Barnet Benavides RN ?MD notified time and response:  1000. No new orders  ? ?Labs reviewed with MD today. Ok to treat per MD. MD aware of ANC 0.3 ?

## 2021-07-30 NOTE — Progress Notes (Signed)
Patient presents today for Vidaza infusion per providers order.  Vital signs and labs reviewed by MD.  [place] noted to be 0.3.  Messaged received from Anastasio Champion RN/Dr. Delton Coombes patient okay for treatment. ? ?Vidaza given today per MD orders.  Stable during infusion without adverse affects.  Vital signs stable.  No complaints at this time.  Discharge from clinic ambulatory in stable condition.  Alert and oriented X 3.  Follow up with [facility] as scheduled.  ?

## 2021-07-30 NOTE — Progress Notes (Signed)
? ?[facility] ?[address] ?[place], [place] [address] ? ? ?CLINIC:  ?Medical Oncology/Hematology ? ?PCP:  ?[name], MD ?[address] / [place] [address] ?[phone] ? ? ?REASON FOR VISIT:  ?Follow-up for MDS with EB 2 ? ?PRIOR THERAPY: none ? ?NGS Results: not done ? ?CURRENT THERAPY: Azacitidine IV D1-7 q28d (started 06/18/21) ? 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 ?  ?07/30/2021 -  Chemotherapy  ? Patient is on Treatment Plan : MYELODYSPLASIA  Azacitidine IV D1-7 q28d  ? ?   ? ? ?CANCER STAGING: ? Cancer Staging  ?No matching staging information was found for the patient. ? ?INTERVAL HISTORY:  ?[name], a 76 y.o. male, returns for routine follow-up and consideration for next cycle of chemotherapy. [place] was last seen on 06/18/2021. ? ?Due for cycle #1 of Azacitidine today.  ? ?Overall, he tells me he has been feeling pretty well. He denies infections and bleeding. He reports 1 episode of night sweats 2 weeks ago. His energy and appetite has improved over the past month. ? ?Overall, he feels ready for next cycle of chemo today.  ? ?REVIEW OF SYSTEMS:  ?Review of Systems  ?Constitutional:  Negative for appetite change and fatigue.  ?HENT:   Negative for nosebleeds.   ?Respiratory:  Negative for hemoptysis.   ?Gastrointestinal:  Negative for blood in stool.  ?Endocrine: Positive for hot flashes (night sweat x1).  ?Genitourinary:  Negative for hematuria.   ?Neurological:  Positive for dizziness.  ?Hematological:  Does not bruise/bleed easily.  ?All other systems reviewed and are negative. ? ?PAST MEDICAL/SURGICAL HISTORY:  ?Past Medical History:  ?Diagnosis Date  ? Allergy   ? Anxiety   ? Arthritis   ? Asthma   ? Cataract   ? Clotting disorder ([facility])   ? DVT (deep venous thrombosis) ([facility])   ? unknown etiology. [facility]  ? GERD (gastroesophageal reflux disease)   ? History  of dental surgery   ? feb 2023  ? Hyperlipidemia   ? Hypertension   ? 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 Pneumonia   ? Port-A-Cath in place 07/30/2021  ? ?Past Surgical History:  ?Procedure Laterality Date  ? CATARACT EXTRACTION W/PHACO Right 11/24/2017  ? Procedure: CATARACT EXTRACTION PHACO AND INTRAOCULAR LENS PLACEMENT RIGHT EYE;  Surgeon: [name], MD;  Location: [facility];  Service: Ophthalmology;  Laterality: Right;  CDE: 8.61  ? CATARACT EXTRACTION W/PHACO Left 12/29/2017  ? Procedure: CATARACT EXTRACTION PHACO AND INTRAOCULAR LENS PLACEMENT (IOC);  Surgeon: [name], MD;  Location: [facility];  Service: Ophthalmology;  Laterality: Left;  CDE: 7.21  ? GANGLION CYST EXCISION    ? HERNIA REPAIR    ? bilateral  ? PARS PLANA VITRECTOMY Left 05/17/2021  ? Procedure: PARS PLANA VITRECTOMY 25 GAUGE WITH ANTIBIOTIC INJECTION  AND ENDOLASER LEFT EYE;  Surgeon: [name], MD;  Location: [facility];  Service: Ophthalmology;  Laterality: Left;  ? PORTACATH PLACEMENT Right 07/09/2021  ? Procedure: INSERTION PORT-A-CATH;  Surgeon: Aviva Signs, MD;  Location: [facility];  Service: General;  Laterality: Right;  ? TONSILLECTOMY    ? VASCULAR SURGERY    ? ? ?SOCIAL HISTORY:  ?Social History  ? ?Socioeconomic History  ? Marital status: Married  ?  Spouse name: [name]  ? Number of children: 0  ? Years of education: 63  ? Highest education level: High  school graduate  ?Occupational History  ? Not on file  ?Tobacco Use  ? Smoking status: Former  ?  Types: Pipe  ?  Quit date: 03/21/1966  ?  Years since quitting: 55.3  ? Smokeless tobacco: Never  ?Vaping Use  ? Vaping Use: Never used  ?Substance and Sexual Activity  ? Alcohol use: Yes  ?  Alcohol/week: 1.0 standard drink  ?  Types: 1 Cans of beer per week  ?  Comment: 1 can of beer a week  ? Drug use: Never  ? Sexual activity: Not Currently  ?Other Topics Concern  ? Not on file  ?Social History Narrative  ? ** Merged History Encounter **  ?    ? Grew up in [place],  [place] and [place].  ?Retired.  ?Worked in [place]. 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   ? ?Social Determinants of Health  ? ?Financial Resource Strain: Not on file  ?Food Insecurity: Not on file  ?Transportation Needs: Not on file  ?Physical Activity: Not on file  ?Stress: Not on file  ?Social Connections: Not on file  ?Intimate Partner Violence: Not on file  ? ? ?FAMILY HISTORY:  ?Family History  ?Problem Relation Age of Onset  ? Cancer Mother   ?     pancreatic  ? Arthritis Father   ? Early death Father 32  ?     Lung infiltrate  ? Colon cancer Neg Hx   ? Colon polyps Neg Hx   ? ? ?CURRENT MEDICATIONS:  ?Current Outpatient Medications  ?Medication Sig Dispense Refill  ? albuterol (PROVENTIL HFA;VENTOLIN HFA) 108 (90 Base) MCG/ACT inhaler Inhale 1-2 puffs into the lungs every 6 (six) hours as needed for wheezing or shortness of breath.    ? ALPRAZolam (XANAX) 0.5 MG tablet Take 0.5 mg by mouth 2 (two) times daily as needed for anxiety.    ? amLODipine (NORVASC) 2.5 MG tablet Take 2.5 mg by mouth daily.    ? azaCITIDine 5 mg/2 mLs in lactated ringers infusion Inject into the vein daily. Days 1-7 every 28 days    ? fluticasone furoate-vilanterol (BREO ELLIPTA) 200-25 MCG/ACT AEPB Inhale 1 puff into the lungs daily.    ? Lidocaine-Hydrocortisone Ace 3-0.5 % CREA Apply 1 application topically as directed. (Patient taking differently: Apply 1 application. topically 2 (two) times daily as needed (hemorrhoids).) 30 Tube 11  ? lidocaine-prilocaine (EMLA) cream Apply a small amount to port a cath site (do not rub in) and cover with plastic wrap 1 hour prior to infusion appointments 30 g 3  ? loratadine (CLARITIN) 10 MG tablet Take 10 mg by mouth daily.    ? Lutein 40 MG CAPS Take 40 mg by mouth daily.    ? Multiple Vitamins-Minerals (CENTRUM ADULTS PO) Take 1 tablet by mouth daily.    ? nystatin cream (MYCOSTATIN) Apply 1 application. topically 2 (two) times daily as needed  for dry skin.    ? ofloxacin (OCUFLOX) 0.3 % ophthalmic solution Place 1 drop into the left eye See admin instructions. 1 drop to left eye 4 x daily for 7 days after monthly procedure.    ? omeprazole (PRILOSEC OTC) 20 MG tablet Take 5-10 mg by mouth daily.    ? prochlorperazine (COMPAZINE) 10 MG tablet Take 1 tablet (10 mg total) by mouth every 6 (six) hours as needed (Nausea or vomiting). 30 tablet 1  ? rivaroxaban (XARELTO) 20 MG TABS tablet Take 20 mg by  mouth daily with supper.    ? sildenafil (VIAGRA) 50 MG tablet Take 50 mg by mouth daily as needed for erectile dysfunction.     ? traMADol (ULTRAM) 50 MG tablet Take 1 tablet (50 mg total) by mouth every 6 (six) hours as needed. 15 tablet 0  ? ?No current facility-administered medications for this visit.  ? ? ?ALLERGIES:  ?Allergies  ?Allergen Reactions  ? Cephalosporins Itching  ? Amoxapine And Related Itching and Rash  ?  Has patient had a PCN reaction causing immediate rash, facial/tongue/throat swelling, SOB or lightheadedness with hypotension: No ?Has patient had a PCN reaction causing severe rash involving mucus membranes or skin necrosis: No ?Has patient had a PCN reaction that required hospitalization: No ?Has patient had a PCN reaction occurring within the last 10 years: No ?If all of the above answers are "NO", then may proceed with Cephalosporin use. ? ?  ? Amoxicillin Rash  ? Cephalexin Rash  ? Clindamycin/Lincomycin Itching and Rash  ? Sulfamethoxazole Itching and Rash  ? Trimethoprim Rash  ? ? ?PHYSICAL EXAM:  ?Performance status (ECOG): 1 - Symptomatic but completely ambulatory ? ?There were no vitals filed for this visit. ?Wt Readings from Last 3 Encounters:  ?07/30/21 156 lb (70.8 kg)  ?07/06/21 151 lb (68.5 kg)  ?07/03/21 151 lb (68.5 kg)  ? ?Physical Exam ?Vitals reviewed.  ?Constitutional:   ?   Appearance: Normal appearance.  ?Cardiovascular:  ?   Rate and Rhythm: Normal rate and regular rhythm.  ?   Pulses: Normal pulses.  ?   Heart  sounds: Normal heart sounds.  ?Pulmonary:  ?   Effort: Pulmonary effort is normal.  ?   Breath sounds: Normal breath sounds.  ?Neurological:  ?   General: No focal deficit present.  ?   Mental Status: He i

## 2021-07-30 NOTE — Patient Instructions (Signed)
[facility]  Discharge Instructions: ?Thank you for choosing [facility] to provide your oncology and hematology care.  ?If you have a lab appointment with the [facility], please come in thru the Main Entrance and check in at the main information desk. ? ?Wear comfortable clothing and clothing appropriate for easy access to any Portacath or PICC line.  ? ?We strive to give you quality time with your provider. You may need to reschedule your appointment if you arrive late (15 or more minutes).  Arriving late affects you and other patients whose appointments are after yours.  Also, if you miss three or more appointments without notifying the office, you may be dismissed from the clinic at the provider?s discretion.    ?  ?For prescription refill requests, have your pharmacy contact our office and allow 72 hours for refills to be completed.   ? ?Today you received the following chemotherapy and/or immunotherapy agents Vidaza    ?  ?To help prevent nausea and vomiting after your treatment, we encourage you to take your nausea medication as directed. ? ?BELOW ARE SYMPTOMS THAT SHOULD BE REPORTED IMMEDIATELY: ?*FEVER GREATER THAN 100.4 F (38 ?C) OR HIGHER ?*CHILLS OR SWEATING ?*NAUSEA AND VOMITING THAT IS NOT CONTROLLED WITH YOUR NAUSEA MEDICATION ?*UNUSUAL SHORTNESS OF BREATH ?*UNUSUAL BRUISING OR BLEEDING ?*URINARY PROBLEMS (pain or burning when urinating, or frequent urination) ?*BOWEL PROBLEMS (unusual diarrhea, constipation, pain near the anus) ?TENDERNESS IN MOUTH AND THROAT WITH OR WITHOUT PRESENCE OF ULCERS (sore throat, sores in mouth, or a toothache) ?UNUSUAL RASH, SWELLING OR PAIN  ?UNUSUAL VAGINAL DISCHARGE OR ITCHING  ? ?Items with * indicate a potential emergency and should be followed up as soon as possible or go to the Emergency Department if any problems should occur. ? ?Please show the CHEMOTHERAPY ALERT CARD or IMMUNOTHERAPY ALERT CARD at check-in to the Emergency  Department and triage nurse. ? ?Should you have questions after your visit or need to cancel or reschedule your appointment, please contact [facility] [phone]  and follow the prompts.  Office hours are 8:00 a.m. to 4:30 p.m. Monday - Friday. Please note that voicemails left after 4:00 p.m. may not be returned until the following business day.  We are closed weekends and major holidays. You have access to a nurse at all times for urgent questions. Please call the main number to the clinic [phone] and follow the prompts. ? ?For any non-urgent questions, you may also contact your provider using MyChart. We now offer e-Visits for anyone 68 and older to request care online for non-urgent symptoms. For details visit [email or web address]. ?  ?Also download the MyChart app! Go to the app store, search "MyChart", open the app, select [facility], and log in with your MyChart username and password. ? ?Due to Covid, a mask is required upon entering the hospital/clinic. If you do not have a mask, one will be given to you upon arrival. For doctor visits, patients may have 1 support person aged 57 or older with them. For treatment visits, patients cannot have anyone with them due to current Covid guidelines and our immunocompromised population.  ?

## 2021-07-30 NOTE — Patient Instructions (Signed)
[facility] ?Chemotherapy Teaching ?  ?  ?You are diagnosed with myelodysplastic syndrome.  You will be treated in the clinic on days 1-7 (Monday-Friday, then return Monday and Tuesday of the following week).  That is considered one cycle of chemotherapy and will repeat every 28 days (from the first day of each treatment).  The drug we will give you is called azacitadine (Vidaza).  The intent of treatment is to control this disease, prevent it from worsening, and to alleviate any symptoms you may be having related to this disease.  You will see the doctor regularly throughout treatment.  We will obtain blood work from you on the first day of treatment and the fifth day of treatment each cycle to monitor your results to make sure it is safe to give your treatment. The doctor monitors your response to treatment by the way you are feeling, your blood work, and by obtaining scans periodically.  There will be wait times while you are here for treatment.  It will take about 30 minutes to 1 hour for your lab work to result.  Then there will be wait times while pharmacy mixes your medications.   ?  ?  ?Medications you will receive in the clinic prior to your chemotherapy medications: ?  ?Aloxi:  ALOXI is used in adults to help prevent nausea and vomiting that happens with certain chemotherapy drugs.  Aloxi is a long acting medication, and will remain in your system for about two days.  ?  ?Dexamethasone:  This is a steroid given prior to chemotherapy to help prevent allergic reactions; it may also help prevent and control nausea and diarrhea.  ?  ?  ?  ?Azacitidine (Vidaza) ?  ?About This Drug ?Azacitidine is used to treat cancer. It is given in the vein (IV).  It will take 15 minutes to infuse.   ?  ?Possible Side Effects ? Bone marrow suppression. This is a decrease in the number of white blood cells, red blood cells, and platelets. This may raise your risk of infection, make you tired and weak, and raise  your risk of bleeding. ?  ? Fever and chills ?  ? Nausea and vomiting (throwing up) ?  ? Constipation (not able to move bowels) ?  ? Diarrhea (loose bowel movements) ?  ? Decreased potassium ?  ? Weakness ?  ? Bruising ?  ? Skin and tissue irritation including redness, pain, warmth, or swelling at the injection site. ?  ? Petechiae. Tiny red spots on the skin, often from low platelets. ?  ?Note: Each of the side effects above was reported in 30% or greater of patients treated with azacitidine. Not all possible side effects are included above. ?  ?  ?Warnings and Precautions ?  ? Risk of changes in your liver function if you have underlying liver disease. ?  ? Changes in your kidney function, which can cause kidney failure and be life-threatening. ?  ? Tumor lysis syndrome which can be life-threatening: This drug may act on the cancer cells very quickly. This may affect how your kidneys work. ?  ?  ?Important Information ?  ? This drug may be present in the saliva, tears, sweat, urine, stool, vomit, semen, and vaginal secretions. Talk to your doctor and/or your nurse about the necessary precautions to take during ?this time. ?  ?  ?Treating Side Effects ? Manage tiredness by pacing your activities for the day. ?  ? Be sure to  include periods of rest between energy-draining activities. ?  ? To help decrease the risk of infections, wash your hands regularly. ?  ? Avoid close contact with people who have a cold, the flu, or other infections. ?  ? Take your temperature as your doctor or nurse tells you, and whenever you feel like you may have a fever. ?  ? To help decrease the risk of bleeding, use a soft toothbrush. Check with your nurse before using dental floss. ?  ? Be very careful when using knives or tools. ?  ? Use an electric shaver instead of a razor. ?  ? Drink plenty of fluids (a minimum of eight glasses per day is recommended). ?  ? If you throw up or have diarrhea, you should drink more fluids so that you do  not become dehydrated (lack of water in the body from losing too much fluid). ?  ? To help with nausea and vomiting, eat small, frequent meals instead of three large meals a day. Choose foods and drinks that are at room temperature. Ask your nurse or doctor about other helpful tips and medicine that is available to help stop or lessen these symptoms. ?  ? If you have diarrhea, eat low-fiber foods that are high in protein and calories and avoid foods that can irritate your digestive tracts or lead to cramping. ?  ? If you are not able to move your bowels, check with your doctor or nurse before you use enemas, laxatives, or suppositories. ?  ? Ask your nurse or doctor about medicine that can lessen or stop your diarrhea and/or constipation. ?  ?Food and Drug Interactions ? There are no known interactions of azacitidine with food. ?  ? This drug may interact with other medicines. Tell your doctor and pharmacist about all the medicines and dietary supplements (vitamins, minerals, herbs, and others) that you are taking at ?this time. Also, check with your doctor or pharmacist before starting any new prescription or over-the-counter medicines, or dietary supplements to make sure that there are no interactions. ?  ?When to Call the Doctor ?  ?Call your doctor or nurse if you have any of these symptoms and/or any new or unusual symptoms: ? Fever of 100.4? F (38? C) or higher ?  ? Chills ?  ? Tiredness that interferes with your daily activities ?  ? Feeling dizzy or lightheaded ?  ? Easy bleeding or bruising ?  ? Nausea that stops you from eating or drinking and/or is not relieved by prescribed medicines ?  ? Throwing up ?  ? Diarrhea, 4 times in one day or diarrhea with lack of strength or a feeling of being dizzy ?  ? No bowel movement in 3 days or when you feel uncomfortable ?  ? Pain, redness, or swelling at the site of the injection ?  ? Any new tiny red spots on the skin ?  ? Decreased and/or dark urine ?  ? Signs of  possible liver problems: dark urine, pale bowel movements, pain in your abdomen, feeling very tired and weak, unusual itching, or yellowing of the eyes or skin ?  ? Signs of tumor lysis: confusion or agitation, decreased urine, nausea/vomiting, diarrhea, muscle cramping, numbness and/or tingling, seizures ?  ? If you think you may be pregnant or may have impregnated your partner ?  ?Reproduction Warnings ? Pregnancy warning: This drug can have harmful effects on the unborn baby. Women of childbearing potential and men with  male partners of childbearing potential should use effective ?methods of birth control during your cancer treatment. Let your doctor know right away if you think you may be pregnant or may have impregnated your partner. ?  ? Breastfeeding warning: It is not known if this drug passes into breast milk. Women should not breastfeed during treatment because this drug could enter the breast milk and cause harm to a ?breastfeeding baby. ?  ? Fertility warning: In men and women both, this drug may affect your ability to have children in the future. Talk with your doctor or nurse if you plan to have children. Ask for information on sperm or egg banking. ?  ?  ?SELF CARE ACTIVITIES WHILE RECEIVING CHEMOTHERAPY: ?  ?Hydration ?Increase your fluid intake 48 hours prior to treatment and drink at least 8 to 12 cups (64 ounces) of water/decaffeinated beverages per day after treatment. You can still have your cup of coffee or soda but these beverages do not count as part of your 8 to 12 cups that you need to drink daily. No alcohol intake. ?  ?Medications ?Continue taking your normal prescription medication as prescribed.  If you start any new herbal or new supplements please let us know first to make sure it is safe. ?  ?Mouth Care ?Have teeth cleaned professionally before starting treatment. Keep dentures and partial plates clean. Use soft toothbrush and do not use mouthwashes that contain alcohol. Biotene is  a good mouthwash that is available at most pharmacies or may be ordered by calling [phone]. Use warm salt water gargles (1 teaspoon salt per 1 quart warm water) before and after meals and at bedt

## 2021-07-31 ENCOUNTER — Inpatient Hospital Stay (HOSPITAL_COMMUNITY): Payer: Medicare HMO

## 2021-07-31 ENCOUNTER — Encounter (HOSPITAL_COMMUNITY): Payer: Self-pay

## 2021-07-31 ENCOUNTER — Encounter (HOSPITAL_COMMUNITY): Payer: Self-pay | Admitting: Hematology

## 2021-07-31 VITALS — BP 124/74 | HR 59 | Temp 97.2°F | Resp 18

## 2021-07-31 DIAGNOSIS — Z95828 Presence of other vascular implants and grafts: Secondary | ICD-10-CM

## 2021-07-31 DIAGNOSIS — D469 Myelodysplastic syndrome, unspecified: Secondary | ICD-10-CM

## 2021-07-31 DIAGNOSIS — Z5111 Encounter for antineoplastic chemotherapy: Secondary | ICD-10-CM | POA: Diagnosis not present

## 2021-07-31 MED ORDER — SODIUM CHLORIDE 0.9 % IV SOLN
10.0000 mg | Freq: Once | INTRAVENOUS | Status: AC
  Administered 2021-07-31: 10 mg via INTRAVENOUS
  Filled 2021-07-31: qty 1

## 2021-07-31 MED ORDER — SODIUM CHLORIDE 0.9 % IV SOLN
55.0000 mg/m2 | Freq: Once | INTRAVENOUS | Status: AC
  Administered 2021-07-31: 100 mg via INTRAVENOUS
  Filled 2021-07-31: qty 10

## 2021-07-31 MED ORDER — HEPARIN SOD (PORK) LOCK FLUSH 100 UNIT/ML IV SOLN
500.0000 [IU] | Freq: Once | INTRAVENOUS | Status: AC | PRN
  Administered 2021-07-31: 500 [IU]

## 2021-07-31 MED ORDER — SODIUM CHLORIDE 0.9 % IV SOLN: Freq: Once | INTRAVENOUS | Status: AC

## 2021-07-31 MED ORDER — SODIUM CHLORIDE 0.9% FLUSH
10.0000 mL | INTRAVENOUS | Status: DC | PRN
  Administered 2021-07-31: 10 mL

## 2021-07-31 NOTE — Patient Instructions (Signed)
[facility]  Discharge Instructions: ?Thank you for choosing [facility] to provide your oncology and hematology care.  ?If you have a lab appointment with the [facility], please come in thru the Main Entrance and check in at the main information desk. ? ?Wear comfortable clothing and clothing appropriate for easy access to any Portacath or PICC line.  ? ?We strive to give you quality time with your provider. You may need to reschedule your appointment if you arrive late (15 or more minutes).  Arriving late affects you and other patients whose appointments are after yours.  Also, if you miss three or more appointments without notifying the office, you may be dismissed from the clinic at the provider?s discretion.    ?  ?For prescription refill requests, have your pharmacy contact our office and allow 72 hours for refills to be completed.   ? ?Today you received the following chemotherapy and/or immunotherapy agents Vidaza.     ?  ?To help prevent nausea and vomiting after your treatment, we encourage you to take your nausea medication as directed. ? ?BELOW ARE SYMPTOMS THAT SHOULD BE REPORTED IMMEDIATELY: ?*FEVER GREATER THAN 100.4 F (38 ?C) OR HIGHER ?*CHILLS OR SWEATING ?*NAUSEA AND VOMITING THAT IS NOT CONTROLLED WITH YOUR NAUSEA MEDICATION ?*UNUSUAL SHORTNESS OF BREATH ?*UNUSUAL BRUISING OR BLEEDING ?*URINARY PROBLEMS (pain or burning when urinating, or frequent urination) ?*BOWEL PROBLEMS (unusual diarrhea, constipation, pain near the anus) ?TENDERNESS IN MOUTH AND THROAT WITH OR WITHOUT PRESENCE OF ULCERS (sore throat, sores in mouth, or a toothache) ?UNUSUAL RASH, SWELLING OR PAIN  ?UNUSUAL VAGINAL DISCHARGE OR ITCHING  ? ?Items with * indicate a potential emergency and should be followed up as soon as possible or go to the Emergency Department if any problems should occur. ? ?Please show the CHEMOTHERAPY ALERT CARD or IMMUNOTHERAPY ALERT CARD at check-in to the Emergency  Department and triage nurse. ? ?Should you have questions after your visit or need to cancel or reschedule your appointment, please contact [facility] Main [phone]  and follow the prompts.  Office hours are 8:00 a.m. to 4:30 p.m. Monday - Friday. Please note that voicemails left after 4:00 p.m. may not be returned until the following business day.  We are closed weekends and major holidays. You have access to a nurse at all times for urgent questions. Please call the main number to the clinic [phone] and follow the prompts. ? ?For any non-urgent questions, you may also contact your provider using MyChart. We now offer e-Visits for anyone 53 and older to request care online for non-urgent symptoms. For details visit [email or web address]. ?  ?Also download the MyChart app! Go to the app store, search "MyChart", open the app, select [facility], and log in with your MyChart username and password. ? ?Due to Covid, a mask is required upon entering the hospital/clinic. If you do not have a mask, one will be given to you upon arrival. For doctor visits, patients may have 1 support person aged 4 or older with them. For treatment visits, patients cannot have anyone with them due to current Covid guidelines and our immunocompromised population.  ?

## 2021-07-31 NOTE — Progress Notes (Signed)
Patient presents today for D2 Vidaza . Patient denies pain today. Patient states he was dizzy this morning while laying in bed and then again upon awakening at 09:00 am. Patient denies any dizziness at this time. Patient called the clinic and left a message on the triage line.  ? ?Treatment given today per MD orders. Tolerated infusion without adverse affects. Vital signs stable. No complaints at this time. Discharged from clinic ambulatory in stable condition. Alert and oriented x 3. F/U with [facility] as scheduled.   ?

## 2021-07-31 NOTE — Progress Notes (Signed)

## 2021-08-01 ENCOUNTER — Inpatient Hospital Stay (HOSPITAL_COMMUNITY): Payer: Medicare HMO

## 2021-08-01 VITALS — BP 116/76 | HR 63 | Temp 97.6°F | Resp 18

## 2021-08-01 DIAGNOSIS — Z95828 Presence of other vascular implants and grafts: Secondary | ICD-10-CM

## 2021-08-01 DIAGNOSIS — Z5111 Encounter for antineoplastic chemotherapy: Secondary | ICD-10-CM | POA: Diagnosis not present

## 2021-08-01 DIAGNOSIS — D469 Myelodysplastic syndrome, unspecified: Secondary | ICD-10-CM

## 2021-08-01 MED ORDER — SODIUM CHLORIDE 0.9 % IV SOLN
10.0000 mg | Freq: Once | INTRAVENOUS | Status: AC
  Administered 2021-08-01: 10 mg via INTRAVENOUS
  Filled 2021-08-01: qty 10

## 2021-08-01 MED ORDER — SODIUM CHLORIDE 0.9 % IV SOLN: Freq: Once | INTRAVENOUS | Status: AC

## 2021-08-01 MED ORDER — HEPARIN SOD (PORK) LOCK FLUSH 100 UNIT/ML IV SOLN
500.0000 [IU] | Freq: Once | INTRAVENOUS | Status: AC | PRN
  Administered 2021-08-01: 500 [IU]

## 2021-08-01 MED ORDER — PALONOSETRON HCL INJECTION 0.25 MG/5ML
0.2500 mg | Freq: Once | INTRAVENOUS | Status: AC
  Administered 2021-08-01: 0.25 mg via INTRAVENOUS
  Filled 2021-08-01: qty 5

## 2021-08-01 MED ORDER — SODIUM CHLORIDE 0.9 % IV SOLN
55.0000 mg/m2 | Freq: Once | INTRAVENOUS | Status: AC
  Administered 2021-08-01: 100 mg via INTRAVENOUS
  Filled 2021-08-01: qty 10

## 2021-08-01 MED ORDER — SODIUM CHLORIDE 0.9% FLUSH
10.0000 mL | INTRAVENOUS | Status: DC | PRN
  Administered 2021-08-01: 10 mL

## 2021-08-01 NOTE — Progress Notes (Signed)
Pt presents today for D3 Vidaza per provider's order. Vital signs stable and pt voiced no new complaints at this time. ? ?D3 Vidaza given today per MD orders. Tolerated infusion without adverse affects. Vital signs stable. No complaints at this time. Discharged from clinic ambulatory in stable condition. Alert and oriented x 3. F/U with [facility] as scheduled.   ? ?

## 2021-08-02 ENCOUNTER — Inpatient Hospital Stay (HOSPITAL_COMMUNITY): Payer: Medicare HMO

## 2021-08-02 VITALS — BP 130/81 | HR 60 | Temp 97.7°F | Resp 20

## 2021-08-02 DIAGNOSIS — Z5111 Encounter for antineoplastic chemotherapy: Secondary | ICD-10-CM | POA: Diagnosis not present

## 2021-08-02 DIAGNOSIS — Z95828 Presence of other vascular implants and grafts: Secondary | ICD-10-CM

## 2021-08-02 DIAGNOSIS — D469 Myelodysplastic syndrome, unspecified: Secondary | ICD-10-CM

## 2021-08-02 MED ORDER — SODIUM CHLORIDE 0.9 % IV SOLN
10.0000 mg | Freq: Once | INTRAVENOUS | Status: AC
  Administered 2021-08-02: 10 mg via INTRAVENOUS
  Filled 2021-08-02: qty 10

## 2021-08-02 MED ORDER — HEPARIN SOD (PORK) LOCK FLUSH 100 UNIT/ML IV SOLN
500.0000 [IU] | Freq: Once | INTRAVENOUS | Status: AC | PRN
  Administered 2021-08-02: 500 [IU]

## 2021-08-02 MED ORDER — SODIUM CHLORIDE 0.9% FLUSH
10.0000 mL | INTRAVENOUS | Status: DC | PRN
  Administered 2021-08-02: 10 mL

## 2021-08-02 MED ORDER — SODIUM CHLORIDE 0.9 % IV SOLN: Freq: Once | INTRAVENOUS | Status: AC

## 2021-08-02 MED ORDER — SODIUM CHLORIDE 0.9 % IV SOLN
55.0000 mg/m2 | Freq: Once | INTRAVENOUS | Status: AC
  Administered 2021-08-02: 100 mg via INTRAVENOUS
  Filled 2021-08-02: qty 10

## 2021-08-02 NOTE — Progress Notes (Signed)
Patient presents today for Vidaza infusion per providers order.  Vital signs within parameters for treatment.  Patient states that he has had some mild constipation, advised the patient that Aloxi could cause constipation and to take a stool softener and see if it helps.  If stool softener doesn't help he was advised to call the clinic for further instructions.  Vidaza given today per MD orders. Stable during infusion without adverse affects.  Vital signs stable.  No complaints at this time.  Discharge from clinic ambulatory in stable condition.  Alert and oriented X 3.  Follow up with [facility] as scheduled.

## 2021-08-02 NOTE — Patient Instructions (Signed)
[facility]  Discharge Instructions: Thank you for choosing [facility] to provide your oncology and hematology care.  If you have a lab appointment with the [facility], please come in thru the Main Entrance and check in at the main information desk.  Wear comfortable clothing and clothing appropriate for easy access to any Portacath or PICC line.   We strive to give you quality time with your provider. You may need to reschedule your appointment if you arrive late (15 or more minutes).  Arriving late affects you and other patients whose appointments are after yours.  Also, if you miss three or more appointments without notifying the office, you may be dismissed from the clinic at the provider's discretion.      For prescription refill requests, have your pharmacy contact our office and allow 72 hours for refills to be completed.    Today you received the following chemotherapy and/or immunotherapy agents Vidaza      To help prevent nausea and vomiting after your treatment, we encourage you to take your nausea medication as directed.  BELOW ARE SYMPTOMS THAT SHOULD BE REPORTED IMMEDIATELY: *FEVER GREATER THAN 100.4 F (38 C) OR HIGHER *CHILLS OR SWEATING *NAUSEA AND VOMITING THAT IS NOT CONTROLLED WITH YOUR NAUSEA MEDICATION *UNUSUAL SHORTNESS OF BREATH *UNUSUAL BRUISING OR BLEEDING *URINARY PROBLEMS (pain or burning when urinating, or frequent urination) *BOWEL PROBLEMS (unusual diarrhea, constipation, pain near the anus) TENDERNESS IN MOUTH AND THROAT WITH OR WITHOUT PRESENCE OF ULCERS (sore throat, sores in mouth, or a toothache) UNUSUAL RASH, SWELLING OR PAIN  UNUSUAL VAGINAL DISCHARGE OR ITCHING   Items with * indicate a potential emergency and should be followed up as soon as possible or go to the Emergency Department if any problems should occur.  Please show the CHEMOTHERAPY ALERT CARD or IMMUNOTHERAPY ALERT CARD at check-in to the Emergency  Department and triage nurse.  Should you have questions after your visit or need to cancel or reschedule your appointment, please contact [facility] [phone]  and follow the prompts.  Office hours are 8:00 a.m. to 4:30 p.m. Monday - Friday. Please note that voicemails left after 4:00 p.m. may not be returned until the following business day.  We are closed weekends and major holidays. You have access to a nurse at all times for urgent questions. Please call the main number to the clinic [phone] and follow the prompts.  For any non-urgent questions, you may also contact your provider using MyChart. We now offer e-Visits for anyone 47 and older to request care online for non-urgent symptoms. For details visit [email or web address].   Also download the MyChart app! Go to the app store, search "MyChart", open the app, select [facility], and log in with your MyChart username and password.  Due to Covid, a mask is required upon entering the hospital/clinic. If you do not have a mask, one will be given to you upon arrival. For doctor visits, patients may have 1 support person aged 2 or older with them. For treatment visits, patients cannot have anyone with them due to current Covid guidelines and our immunocompromised population.

## 2021-08-03 ENCOUNTER — Inpatient Hospital Stay (HOSPITAL_COMMUNITY): Payer: Medicare HMO

## 2021-08-03 VITALS — BP 115/76 | HR 62 | Temp 97.8°F | Resp 16

## 2021-08-03 DIAGNOSIS — D469 Myelodysplastic syndrome, unspecified: Secondary | ICD-10-CM

## 2021-08-03 DIAGNOSIS — Z5111 Encounter for antineoplastic chemotherapy: Secondary | ICD-10-CM | POA: Diagnosis not present

## 2021-08-03 DIAGNOSIS — Z95828 Presence of other vascular implants and grafts: Secondary | ICD-10-CM

## 2021-08-03 MED ORDER — PALONOSETRON HCL INJECTION 0.25 MG/5ML
0.2500 mg | Freq: Once | INTRAVENOUS | Status: AC
  Administered 2021-08-03: 0.25 mg via INTRAVENOUS
  Filled 2021-08-03: qty 5

## 2021-08-03 MED ORDER — SODIUM CHLORIDE 0.9 % IV SOLN
55.0000 mg/m2 | Freq: Once | INTRAVENOUS | Status: AC
  Administered 2021-08-03: 100 mg via INTRAVENOUS
  Filled 2021-08-03: qty 10

## 2021-08-03 MED ORDER — SODIUM CHLORIDE 0.9 % IV SOLN
10.0000 mg | Freq: Once | INTRAVENOUS | Status: AC
  Administered 2021-08-03: 10 mg via INTRAVENOUS
  Filled 2021-08-03: qty 10

## 2021-08-03 MED ORDER — HEPARIN SOD (PORK) LOCK FLUSH 100 UNIT/ML IV SOLN
500.0000 [IU] | Freq: Once | INTRAVENOUS | Status: AC | PRN
  Administered 2021-08-03: 500 [IU]

## 2021-08-03 MED ORDER — SODIUM CHLORIDE 0.9 % IV SOLN: Freq: Once | INTRAVENOUS | Status: AC

## 2021-08-03 MED ORDER — SODIUM CHLORIDE 0.9% FLUSH
10.0000 mL | INTRAVENOUS | Status: DC | PRN
  Administered 2021-08-03: 10 mL

## 2021-08-06 ENCOUNTER — Inpatient Hospital Stay (HOSPITAL_COMMUNITY): Payer: Medicare HMO

## 2021-08-06 VITALS — BP 120/85 | HR 67 | Temp 97.7°F | Resp 18

## 2021-08-06 DIAGNOSIS — D696 Thrombocytopenia, unspecified: Secondary | ICD-10-CM

## 2021-08-06 DIAGNOSIS — Z5111 Encounter for antineoplastic chemotherapy: Secondary | ICD-10-CM | POA: Diagnosis not present

## 2021-08-06 DIAGNOSIS — Z95828 Presence of other vascular implants and grafts: Secondary | ICD-10-CM

## 2021-08-06 DIAGNOSIS — D46Z Other myelodysplastic syndromes: Secondary | ICD-10-CM

## 2021-08-06 DIAGNOSIS — D469 Myelodysplastic syndrome, unspecified: Secondary | ICD-10-CM

## 2021-08-06 LAB — COMPREHENSIVE METABOLIC PANEL
ALT: 14 U/L (ref 0–44)
AST: 16 U/L (ref 15–41)
Albumin: 3.9 g/dL (ref 3.5–5.0)
Alkaline Phosphatase: 57 U/L (ref 38–126)
Anion gap: 5 (ref 5–15)
BUN: 18 mg/dL (ref 8–23)
CO2: 28 mmol/L (ref 22–32)
Calcium: 9.5 mg/dL (ref 8.9–10.3)
Chloride: 106 mmol/L (ref 98–111)
Creatinine, Ser: 0.96 mg/dL (ref 0.61–1.24)
GFR, Estimated: >60 mL/min (ref >60)
Glucose, Bld: 130 mg/dL — ABNORMAL HIGH (ref 70–99)
Potassium: 3.7 mmol/L (ref 3.5–5.1)
Sodium: 139 mmol/L (ref 135–145)
Total Bilirubin: 1.7 mg/dL — ABNORMAL HIGH (ref 0.3–1.2)
Total Protein: 6.2 g/dL — ABNORMAL LOW (ref 6.5–8.1)

## 2021-08-06 LAB — CBC WITH DIFFERENTIAL/PLATELET
Abs Immature Granulocytes: 0.01 10*3/uL (ref 0.00–0.07)
Basophils Absolute: 0 10*3/uL (ref 0.0–0.1)
Basophils Relative: 0 %
Eosinophils Absolute: 0.1 10*3/uL (ref 0.0–0.5)
Eosinophils Relative: 4 %
HCT: 31.8 % — ABNORMAL LOW (ref 39.0–52.0)
Hemoglobin: 10.7 g/dL — ABNORMAL LOW (ref 13.0–17.0)
Immature Granulocytes: 0 %
Lymphocytes Relative: 71 %
Lymphs Abs: 1.7 10*3/uL (ref 0.7–4.0)
MCH: 32.3 pg (ref 26.0–34.0)
MCHC: 33.6 g/dL (ref 30.0–36.0)
MCV: 96.1 fL (ref 80.0–100.0)
Monocytes Absolute: 0.1 10*3/uL (ref 0.1–1.0)
Monocytes Relative: 6 %
Neutro Abs: 0.4 10*3/uL — CL (ref 1.7–7.7)
Neutrophils Relative %: 19 %
Platelets: 103 10*3/uL — ABNORMAL LOW (ref 150–400)
RBC: 3.31 MIL/uL — ABNORMAL LOW (ref 4.22–5.81)
RDW: 14.3 % (ref 11.5–15.5)
WBC: 2.3 10*3/uL — ABNORMAL LOW (ref 4.0–10.5)
nRBC: 0 % (ref 0.0–0.2)

## 2021-08-06 LAB — MAGNESIUM: Magnesium: 2.2 mg/dL (ref 1.7–2.4)

## 2021-08-06 MED ORDER — SODIUM CHLORIDE 0.9% FLUSH
10.0000 mL | INTRAVENOUS | Status: DC | PRN
  Administered 2021-08-06: 10 mL

## 2021-08-06 MED ORDER — SODIUM CHLORIDE 0.9 % IV SOLN
10.0000 mg | Freq: Once | INTRAVENOUS | Status: AC
  Administered 2021-08-06: 10 mg via INTRAVENOUS
  Filled 2021-08-06: qty 10

## 2021-08-06 MED ORDER — SODIUM CHLORIDE 0.9 % IV SOLN
55.0000 mg/m2 | Freq: Once | INTRAVENOUS | Status: AC
  Administered 2021-08-06: 100 mg via INTRAVENOUS
  Filled 2021-08-06: qty 10

## 2021-08-06 MED ORDER — SODIUM CHLORIDE 0.9 % IV SOLN: Freq: Once | INTRAVENOUS | Status: AC

## 2021-08-06 MED ORDER — PALONOSETRON HCL INJECTION 0.25 MG/5ML
0.2500 mg | Freq: Once | INTRAVENOUS | Status: AC
  Administered 2021-08-06: 0.25 mg via INTRAVENOUS
  Filled 2021-08-06: qty 5

## 2021-08-06 MED ORDER — HEPARIN SOD (PORK) LOCK FLUSH 100 UNIT/ML IV SOLN
500.0000 [IU] | Freq: Once | INTRAVENOUS | Status: AC | PRN
  Administered 2021-08-06: 500 [IU]

## 2021-08-06 NOTE — Progress Notes (Signed)
CRITICAL VALUE ALERT Critical value received:  ANC 0.4 Date of notification:  08/06/2021 Time of notification: 13:10 pm Critical value read back:  Yes.   Nurse who received alert:  B. Yvette Loveless Rn MD notified time and response:  [place].

## 2021-08-06 NOTE — Patient Instructions (Signed)
[facility]  Discharge Instructions: Thank you for choosing [facility] to provide your oncology and hematology care.  If you have a lab appointment with the [facility], please come in thru the Main Entrance and check in at the main information desk.  Wear comfortable clothing and clothing appropriate for easy access to any Portacath or PICC line.   We strive to give you quality time with your provider. You may need to reschedule your appointment if you arrive late (15 or more minutes).  Arriving late affects you and other patients whose appointments are after yours.  Also, if you miss three or more appointments without notifying the office, you may be dismissed from the clinic at the provider's discretion.      For prescription refill requests, have your pharmacy contact our office and allow 72 hours for refills to be completed.    Today you received the following chemotherapy and/or immunotherapy agents vidaza.  Azacitidine suspension for injection (subcutaneous use) What is this medication? AZACITIDINE (ay Tazlina) is a chemotherapy drug. This medicine reduces the growth of cancer cells and can suppress the immune system. It is used for treating myelodysplastic syndrome or some types of leukemia. This medicine may be used for other purposes; ask your health care provider or pharmacist if you have questions. COMMON BRAND NAME(S): Vidaza What should I tell my care team before I take this medication? They need to know if you have any of these conditions: kidney disease liver disease liver tumors an unusual or allergic reaction to azacitidine, mannitol, other medicines, foods, dyes, or preservatives pregnant or trying to get pregnant breast-feeding How should I use this medication? This medicine is for injection under the skin. It is administered in a hospital or clinic by a specially trained health care professional. Talk to your pediatrician regarding  the use of this medicine in children. While this drug may be prescribed for selected conditions, precautions do apply. Overdosage: If you think you have taken too much of this medicine contact a poison control center or emergency room at once. NOTE: This medicine is only for you. Do not share this medicine with others. What if I miss a dose? It is important not to miss your dose. Call your doctor or health care professional if you are unable to keep an appointment. What may interact with this medication? Interactions have not been studied. Give your health care provider a list of all the medicines, herbs, non-prescription drugs, or dietary supplements you use. Also tell them if you smoke, drink alcohol, or use illegal drugs. Some items may interact with your medicine. This list may not describe all possible interactions. Give your health care provider a list of all the medicines, herbs, non-prescription drugs, or dietary supplements you use. Also tell them if you smoke, drink alcohol, or use illegal drugs. Some items may interact with your medicine. What should I watch for while using this medication? Visit your doctor for checks on your progress. This drug may make you feel generally unwell. This is not uncommon, as chemotherapy can affect healthy cells as well as cancer cells. Report any side effects. Continue your course of treatment even though you feel ill unless your doctor tells you to stop. In some cases, you may be given additional medicines to help with side effects. Follow all directions for their use. Call your doctor or health care professional for advice if you get a fever, chills or sore throat, or other  symptoms of a cold or flu. Do not treat yourself. This drug decreases your body's ability to fight infections. Try to avoid being around people who are sick. This medicine may increase your risk to bruise or bleed. Call your doctor or health care professional if you notice any unusual  bleeding. You may need blood work done while you are taking this medicine. Do not become pregnant while taking this medicine and for 6 months after the last dose. Women should inform their doctor if they wish to become pregnant or think they might be pregnant. Men should not father a child while taking this medicine and for 3 months after the last dose. There is a potential for serious side effects to an unborn child. Talk to your health care professional or pharmacist for more information. Do not breast-feed an infant while taking this medicine and for 1 week after the last dose. This medicine may interfere with the ability to have a child. Talk with your doctor or health care professional if you are concerned about your fertility. What side effects may I notice from receiving this medication? Side effects that you should report to your doctor or health care professional as soon as possible: allergic reactions like skin rash, itching or hives, swelling of the face, lips, or tongue low blood counts - this medicine may decrease the number of white blood cells, red blood cells and platelets. You may be at increased risk for infections and bleeding. signs of infection - fever or chills, cough, sore throat, pain passing urine signs of decreased platelets or bleeding - bruising, pinpoint red spots on the skin, black, tarry stools, blood in the urine signs of decreased red blood cells - unusually weak or tired, fainting spells, lightheadedness signs and symptoms of kidney injury like trouble passing urine or change in the amount of urine signs and symptoms of liver injury like dark yellow or brown urine; general ill feeling or flu-like symptoms; light-colored stools; loss of appetite; nausea; right upper belly pain; unusually weak or tired; yellowing of the eyes or skin Side effects that usually do not require medical attention (report to your doctor or health care professional if they continue or are  bothersome): constipation diarrhea nausea, vomiting pain or redness at the injection site unusually weak or tired This list may not describe all possible side effects. Call your doctor for medical advice about side effects. You may report side effects to FDA at 1-800-FDA-1088. Where should I keep my medication? This drug is given in a hospital or clinic and will not be stored at home. NOTE: This sheet is a summary. It may not cover all possible information. If you have questions about this medicine, talk to your doctor, pharmacist, or health care provider.  2023 Elsevier/Gold Standard (2016-04-03 00:00:00)       To help prevent nausea and vomiting after your treatment, we encourage you to take your nausea medication as directed.  BELOW ARE SYMPTOMS THAT SHOULD BE REPORTED IMMEDIATELY: *FEVER GREATER THAN 100.4 F (38 C) OR HIGHER *CHILLS OR SWEATING *NAUSEA AND VOMITING THAT IS NOT CONTROLLED WITH YOUR NAUSEA MEDICATION *UNUSUAL SHORTNESS OF BREATH *UNUSUAL BRUISING OR BLEEDING *URINARY PROBLEMS (pain or burning when urinating, or frequent urination) *BOWEL PROBLEMS (unusual diarrhea, constipation, pain near the anus) TENDERNESS IN MOUTH AND THROAT WITH OR WITHOUT PRESENCE OF ULCERS (sore throat, sores in mouth, or a toothache) UNUSUAL RASH, SWELLING OR PAIN  UNUSUAL VAGINAL DISCHARGE OR ITCHING   Items with * indicate a potential  emergency and should be followed up as soon as possible or go to the Emergency Department if any problems should occur.  Please show the CHEMOTHERAPY ALERT CARD or IMMUNOTHERAPY ALERT CARD at check-in to the Emergency Department and triage nurse.  Should you have questions after your visit or need to cancel or reschedule your appointment, please contact [facility] [phone]  and follow the prompts.  Office hours are 8:00 a.m. to 4:30 p.m. Monday - Friday. Please note that voicemails left after 4:00 p.m. may not be returned until the  following business day.  We are closed weekends and major holidays. You have access to a nurse at all times for urgent questions. Please call the main number to the clinic [phone] and follow the prompts.  For any non-urgent questions, you may also contact your provider using MyChart. We now offer e-Visits for anyone 16 and older to request care online for non-urgent symptoms. For details visit [email or web address].   Also download the MyChart app! Go to the app store, search "MyChart", open the app, select [facility], and log in with your MyChart username and password.  Due to Covid, a mask is required upon entering the hospital/clinic. If you do not have a mask, one will be given to you upon arrival. For doctor visits, patients may have 1 support person aged 33 or older with them. For treatment visits, patients cannot have anyone with them due to current Covid guidelines and our immunocompromised population.

## 2021-08-06 NOTE — Progress Notes (Signed)
ANC 0.4 today for treatment.  Dr. Delton Coombes notified. Ok for treatment verbal order Dr. Delton Coombes.

## 2021-08-07 ENCOUNTER — Inpatient Hospital Stay (HOSPITAL_COMMUNITY): Payer: Medicare HMO

## 2021-08-07 VITALS — BP 119/73 | HR 71 | Temp 98.5°F | Resp 18

## 2021-08-07 DIAGNOSIS — D469 Myelodysplastic syndrome, unspecified: Secondary | ICD-10-CM

## 2021-08-07 DIAGNOSIS — Z5111 Encounter for antineoplastic chemotherapy: Secondary | ICD-10-CM | POA: Diagnosis not present

## 2021-08-07 DIAGNOSIS — Z95828 Presence of other vascular implants and grafts: Secondary | ICD-10-CM

## 2021-08-07 MED ORDER — SODIUM CHLORIDE 0.9 % IV SOLN
10.0000 mg | Freq: Once | INTRAVENOUS | Status: AC
  Administered 2021-08-07: 10 mg via INTRAVENOUS
  Filled 2021-08-07: qty 1

## 2021-08-07 MED ORDER — HEPARIN SOD (PORK) LOCK FLUSH 100 UNIT/ML IV SOLN
500.0000 [IU] | Freq: Once | INTRAVENOUS | Status: AC | PRN
  Administered 2021-08-07: 500 [IU]

## 2021-08-07 MED ORDER — SODIUM CHLORIDE 0.9 % IV SOLN: Freq: Once | INTRAVENOUS | Status: AC

## 2021-08-07 MED ORDER — SODIUM CHLORIDE 0.9 % IV SOLN
55.0000 mg/m2 | Freq: Once | INTRAVENOUS | Status: AC
  Administered 2021-08-07: 100 mg via INTRAVENOUS
  Filled 2021-08-07: qty 10

## 2021-08-07 MED ORDER — SODIUM CHLORIDE 0.9% FLUSH
10.0000 mL | INTRAVENOUS | Status: DC | PRN
  Administered 2021-08-07: 10 mL

## 2021-08-07 NOTE — Progress Notes (Signed)
Patient presents today for Vidaza infusion per providers order.  Vital signs within parameters for treatment.  Patient has no new complaints at this time.    Vidaza given today per MD orders.  Stable during infusion without adverse affects.  Vital signs stable.  No complaints at this time.  Discharge from clinic ambulatory in stable condition.  Alert and oriented X 3.  Follow up with [facility] as scheduled.

## 2021-08-07 NOTE — Patient Instructions (Signed)
[facility]  Discharge Instructions: Thank you for choosing [facility] to provide your oncology and hematology care.  If you have a lab appointment with the [facility], please come in thru the Main Entrance and check in at the main information desk.  Wear comfortable clothing and clothing appropriate for easy access to any Portacath or PICC line.   We strive to give you quality time with your provider. You may need to reschedule your appointment if you arrive late (15 or more minutes).  Arriving late affects you and other patients whose appointments are after yours.  Also, if you miss three or more appointments without notifying the office, you may be dismissed from the clinic at the provider's discretion.      For prescription refill requests, have your pharmacy contact our office and allow 72 hours for refills to be completed.    Today you received the following chemotherapy and/or immunotherapy agents Vidaza      To help prevent nausea and vomiting after your treatment, we encourage you to take your nausea medication as directed.  BELOW ARE SYMPTOMS THAT SHOULD BE REPORTED IMMEDIATELY: *FEVER GREATER THAN 100.4 F (38 C) OR HIGHER *CHILLS OR SWEATING *NAUSEA AND VOMITING THAT IS NOT CONTROLLED WITH YOUR NAUSEA MEDICATION *UNUSUAL SHORTNESS OF BREATH *UNUSUAL BRUISING OR BLEEDING *URINARY PROBLEMS (pain or burning when urinating, or frequent urination) *BOWEL PROBLEMS (unusual diarrhea, constipation, pain near the anus) TENDERNESS IN MOUTH AND THROAT WITH OR WITHOUT PRESENCE OF ULCERS (sore throat, sores in mouth, or a toothache) UNUSUAL RASH, SWELLING OR PAIN  UNUSUAL VAGINAL DISCHARGE OR ITCHING   Items with * indicate a potential emergency and should be followed up as soon as possible or go to the Emergency Department if any problems should occur.  Please show the CHEMOTHERAPY ALERT CARD or IMMUNOTHERAPY ALERT CARD at check-in to the Emergency  Department and triage nurse.  Should you have questions after your visit or need to cancel or reschedule your appointment, please contact [facility] [phone]  and follow the prompts.  Office hours are 8:00 a.m. to 4:30 p.m. Monday - Friday. Please note that voicemails left after 4:00 p.m. may not be returned until the following business day.  We are closed weekends and major holidays. You have access to a nurse at all times for urgent questions. Please call the main number to the clinic [phone] and follow the prompts.  For any non-urgent questions, you may also contact your provider using MyChart. We now offer e-Visits for anyone 4 and older to request care online for non-urgent symptoms. For details visit [email or web address].   Also download the MyChart app! Go to the app store, search "MyChart", open the app, select [facility], and log in with your MyChart username and password.  Due to Covid, a mask is required upon entering the hospital/clinic. If you do not have a mask, one will be given to you upon arrival. For doctor visits, patients may have 1 support person aged 63 or older with them. For treatment visits, patients cannot have anyone with them due to current Covid guidelines and our immunocompromised population.

## 2021-08-08 ENCOUNTER — Telehealth (HOSPITAL_COMMUNITY): Payer: Self-pay

## 2021-08-08 NOTE — Telephone Encounter (Signed)
Patient called back and states that he is doing well today with no side effects noted.

## 2021-08-08 NOTE — Telephone Encounter (Signed)
Patient called for 24-hour follow-up call, patient did not answer phone call.

## 2021-08-16 ENCOUNTER — Inpatient Hospital Stay (HOSPITAL_COMMUNITY): Payer: Medicare HMO | Attending: Hematology

## 2021-08-16 ENCOUNTER — Inpatient Hospital Stay (HOSPITAL_COMMUNITY): Payer: Medicare HMO

## 2021-08-16 DIAGNOSIS — D4622 Refractory anemia with excess of blasts 2: Secondary | ICD-10-CM | POA: Insufficient documentation

## 2021-08-16 DIAGNOSIS — K5900 Constipation, unspecified: Secondary | ICD-10-CM | POA: Insufficient documentation

## 2021-08-16 DIAGNOSIS — D696 Thrombocytopenia, unspecified: Secondary | ICD-10-CM

## 2021-08-16 DIAGNOSIS — Z5111 Encounter for antineoplastic chemotherapy: Secondary | ICD-10-CM | POA: Insufficient documentation

## 2021-08-16 DIAGNOSIS — Z79899 Other long term (current) drug therapy: Secondary | ICD-10-CM | POA: Diagnosis not present

## 2021-08-16 DIAGNOSIS — D46Z Other myelodysplastic syndromes: Secondary | ICD-10-CM

## 2021-08-16 LAB — CBC WITH DIFFERENTIAL/PLATELET
Abs Immature Granulocytes: 0 10*3/uL (ref 0.00–0.07)
Basophils Absolute: 0 10*3/uL (ref 0.0–0.1)
Basophils Relative: 0 %
Eosinophils Absolute: 0 10*3/uL (ref 0.0–0.5)
Eosinophils Relative: 2 %
HCT: 29.9 % — ABNORMAL LOW (ref 39.0–52.0)
Hemoglobin: 10.1 g/dL — ABNORMAL LOW (ref 13.0–17.0)
Immature Granulocytes: 0 %
Lymphocytes Relative: 59 %
Lymphs Abs: 0.8 10*3/uL (ref 0.7–4.0)
MCH: 32.9 pg (ref 26.0–34.0)
MCHC: 33.8 g/dL (ref 30.0–36.0)
MCV: 97.4 fL (ref 80.0–100.0)
Monocytes Absolute: 0.2 10*3/uL (ref 0.1–1.0)
Monocytes Relative: 13 %
Neutro Abs: 0.4 10*3/uL — CL (ref 1.7–7.7)
Neutrophils Relative %: 26 %
Platelets: 59 10*3/uL — ABNORMAL LOW (ref 150–400)
RBC: 3.07 MIL/uL — ABNORMAL LOW (ref 4.22–5.81)
RDW: 15.1 % (ref 11.5–15.5)
WBC: 1.4 10*3/uL — CL (ref 4.0–10.5)
nRBC: 0 % (ref 0.0–0.2)

## 2021-08-16 LAB — SAMPLE TO BLOOD BANK

## 2021-08-16 MED ORDER — SODIUM CHLORIDE 0.9% FLUSH
10.0000 mL | INTRAVENOUS | Status: DC | PRN
  Administered 2021-08-16: 10 mL via INTRAVENOUS

## 2021-08-16 MED ORDER — HEPARIN SOD (PORK) LOCK FLUSH 100 UNIT/ML IV SOLN
500.0000 [IU] | Freq: Once | INTRAVENOUS | Status: AC
  Administered 2021-08-16: 500 [IU] via INTRAVENOUS

## 2021-08-16 NOTE — Progress Notes (Signed)
CRITICAL VALUE ALERT Critical value received:  WBC 1.4, ANC 0.4 Date of notification:  08-16-21 Time of notification: 5784 Critical value read back:  Yes.   Nurse who received alert:  C. Patty Leitzke RN MD notified time and response:  [id number], no new orders.

## 2021-08-16 NOTE — Progress Notes (Signed)
Pt presents today for possible blood transfusion per provider's order. Pt's hemoglobin noted to be 10.1 today. No blood transfusion needed today per Dr.K. Pt denies weakness, fatigue, and shortness of breath. Patient advised to call the clinic if any changes occur. Pt verbalized understanding.

## 2021-08-27 ENCOUNTER — Inpatient Hospital Stay (HOSPITAL_BASED_OUTPATIENT_CLINIC_OR_DEPARTMENT_OTHER): Payer: Medicare HMO | Admitting: Hematology

## 2021-08-27 ENCOUNTER — Inpatient Hospital Stay (HOSPITAL_COMMUNITY): Payer: Medicare HMO

## 2021-08-27 ENCOUNTER — Encounter (HOSPITAL_COMMUNITY): Payer: Self-pay | Admitting: Hematology

## 2021-08-27 VITALS — BP 114/72 | HR 58 | Temp 97.5°F | Resp 18

## 2021-08-27 DIAGNOSIS — D46Z Other myelodysplastic syndromes: Secondary | ICD-10-CM | POA: Diagnosis not present

## 2021-08-27 DIAGNOSIS — Z95828 Presence of other vascular implants and grafts: Secondary | ICD-10-CM

## 2021-08-27 DIAGNOSIS — D469 Myelodysplastic syndrome, unspecified: Secondary | ICD-10-CM

## 2021-08-27 DIAGNOSIS — D696 Thrombocytopenia, unspecified: Secondary | ICD-10-CM

## 2021-08-27 DIAGNOSIS — Z5111 Encounter for antineoplastic chemotherapy: Secondary | ICD-10-CM | POA: Diagnosis not present

## 2021-08-27 LAB — LACTATE DEHYDROGENASE: LDH: 148 U/L (ref 98–192)

## 2021-08-27 LAB — MAGNESIUM: Magnesium: 1.9 mg/dL (ref 1.7–2.4)

## 2021-08-27 LAB — CBC WITH DIFFERENTIAL/PLATELET
Abs Immature Granulocytes: 0 10*3/uL (ref 0.00–0.07)
Basophils Absolute: 0 10*3/uL (ref 0.0–0.1)
Basophils Relative: 1 %
Eosinophils Absolute: 0.1 10*3/uL (ref 0.0–0.5)
Eosinophils Relative: 5 %
HCT: 31.7 % — ABNORMAL LOW (ref 39.0–52.0)
Hemoglobin: 10.5 g/dL — ABNORMAL LOW (ref 13.0–17.0)
Immature Granulocytes: 0 %
Lymphocytes Relative: 63 %
Lymphs Abs: 1 10*3/uL (ref 0.7–4.0)
MCH: 32.2 pg (ref 26.0–34.0)
MCHC: 33.1 g/dL (ref 30.0–36.0)
MCV: 97.2 fL (ref 80.0–100.0)
Monocytes Absolute: 0.1 10*3/uL (ref 0.1–1.0)
Monocytes Relative: 6 %
Neutro Abs: 0.4 10*3/uL — CL (ref 1.7–7.7)
Neutrophils Relative %: 25 %
Platelets: 121 10*3/uL — ABNORMAL LOW (ref 150–400)
RBC: 3.26 MIL/uL — ABNORMAL LOW (ref 4.22–5.81)
RDW: 15.3 % (ref 11.5–15.5)
WBC: 1.5 10*3/uL — ABNORMAL LOW (ref 4.0–10.5)
nRBC: 0 % (ref 0.0–0.2)

## 2021-08-27 LAB — COMPREHENSIVE METABOLIC PANEL
ALT: 12 U/L (ref 0–44)
AST: 18 U/L (ref 15–41)
Albumin: 3.6 g/dL (ref 3.5–5.0)
Alkaline Phosphatase: 62 U/L (ref 38–126)
Anion gap: 2 — ABNORMAL LOW (ref 5–15)
BUN: 14 mg/dL (ref 8–23)
CO2: 25 mmol/L (ref 22–32)
Calcium: 8.9 mg/dL (ref 8.9–10.3)
Chloride: 110 mmol/L (ref 98–111)
Creatinine, Ser: 0.94 mg/dL (ref 0.61–1.24)
GFR, Estimated: >60 mL/min (ref >60)
Glucose, Bld: 138 mg/dL — ABNORMAL HIGH (ref 70–99)
Potassium: 3.4 mmol/L — ABNORMAL LOW (ref 3.5–5.1)
Sodium: 137 mmol/L (ref 135–145)
Total Bilirubin: 1.5 mg/dL — ABNORMAL HIGH (ref 0.3–1.2)
Total Protein: 6.1 g/dL — ABNORMAL LOW (ref 6.5–8.1)

## 2021-08-27 MED ORDER — SODIUM CHLORIDE 0.9 % IV SOLN: Freq: Once | INTRAVENOUS | Status: AC

## 2021-08-27 MED ORDER — HEPARIN SOD (PORK) LOCK FLUSH 100 UNIT/ML IV SOLN
500.0000 [IU] | Freq: Once | INTRAVENOUS | Status: AC | PRN
  Administered 2021-08-27: 500 [IU]

## 2021-08-27 MED ORDER — SODIUM CHLORIDE 0.9 % IV SOLN
10.0000 mg | Freq: Once | INTRAVENOUS | Status: AC
  Administered 2021-08-27: 10 mg via INTRAVENOUS
  Filled 2021-08-27: qty 10

## 2021-08-27 MED ORDER — SODIUM CHLORIDE 0.9 % IV SOLN
65.0000 mg/m2 | Freq: Once | INTRAVENOUS | Status: AC
  Administered 2021-08-27: 120 mg via INTRAVENOUS
  Filled 2021-08-27: qty 12

## 2021-08-27 MED ORDER — SODIUM CHLORIDE 0.9% FLUSH
10.0000 mL | INTRAVENOUS | Status: DC | PRN
  Administered 2021-08-27: 10 mL

## 2021-08-27 MED ORDER — PALONOSETRON HCL INJECTION 0.25 MG/5ML
0.2500 mg | Freq: Once | INTRAVENOUS | Status: AC
  Administered 2021-08-27: 0.25 mg via INTRAVENOUS
  Filled 2021-08-27: qty 5

## 2021-08-27 NOTE — Progress Notes (Signed)
Patient presents today for Vidaza infusion per providers order.  Vital signs within parameters for treatment.  Labs reviewed and ANC noted to be 0.4.  MD notified.    Messages received from Anastasio Champion RN/Dr. Delton Coombes petirnt okay for treatment.  Vidaza given today per MD orders.  Stable during infusion without adverse affects.  Vital signs stable.  No complaints at this time.  Discharge from clinic ambulatory in stable condition.  Alert and oriented X 3.  Follow up with [facility] as scheduled.

## 2021-08-27 NOTE — Progress Notes (Signed)
CRITICAL VALUE ALERT Critical value received:  ANC 0.4 Date of notification:  08-27-21 Time of notification: 9563 Critical value read back:  Yes.   Nurse who received alert:  C. Nezar Buckles RN MD notified time and response:  [id number] , no orders.

## 2021-08-27 NOTE — Progress Notes (Signed)
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  INTERVAL HISTORY:  [name], a 76 y.o. male, returns for routine follow-up and consideration for next cycle of chemotherapy. Raygen was last seen on 07/30/2021.  Due for cycle #2 of Azacitidine today.   Overall, he tells me he has been feeling pretty well. He reports constipation which is well controlled with Colace every 6 hours prn. He denies fever, fatigue, and infection.  Overall, he feels ready for next cycle of chemo today.    REVIEW OF SYSTEMS:  Review of Systems  Constitutional:  Negative for fatigue and fever.  Gastrointestinal:  Positive for constipation.  All other systems reviewed and are negative.   PAST MEDICAL/SURGICAL HISTORY:  Past Medical History:  Diagnosis Date   Allergy    Anxiety    Arthritis    Asthma    Cataract    Clotting disorder ([place])    DVT (deep venous thrombosis) (HCC)    unknown etiology. 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   Pneumonia    Port-A-Cath in place 07/30/2021   Past Surgical History:  Procedure Laterality Date   CATARACT EXTRACTION W/PHACO Right 11/24/2017   Procedure:  CATARACT EXTRACTION PHACO AND INTRAOCULAR LENS PLACEMENT RIGHT EYE;  Surgeon: [name], MD;  Location: [facility];  Service: Ophthalmology;  Laterality: Right;  CDE: 8.61   CATARACT EXTRACTION W/PHACO Left 12/29/2017   Procedure: CATARACT EXTRACTION PHACO AND INTRAOCULAR LENS PLACEMENT (IOC);  Surgeon: [name], MD;  Location: [facility];  Service: Ophthalmology;  Laterality: Left;  CDE: 7.21   GANGLION CYST EXCISION     HERNIA REPAIR     bilateral   PARS PLANA VITRECTOMY Left 05/17/2021   Procedure: PARS PLANA VITRECTOMY 25 GAUGE WITH ANTIBIOTIC INJECTION  AND ENDOLASER LEFT EYE;  Surgeon: [name], MD;  Location: [facility];  Service: Ophthalmology;  Laterality: Left;   PORTACATH PLACEMENT Right 07/09/2021   Procedure: INSERTION PORT-A-CATH;  Surgeon: Aviva Signs, MD;  Location: [facility];  Service: General;  Laterality: Right;   TONSILLECTOMY     VASCULAR SURGERY      SOCIAL HISTORY:  Social History   Socioeconomic History   Marital status: Married    Spouse name: [name]   Number of children: 0   Years of education: 13   Highest education level: High school graduate  Occupational History   Not on file  Tobacco Use   Smoking status: Former    Types: Pipe    Quit date: 03/21/1966    Years since quitting: 55.4   Smokeless tobacco: Never  Vaping Use   Vaping Use: Never used  Substance  and Sexual Activity   Alcohol use: Yes    Alcohol/week: 1.0 standard drink of alcohol    Types: 1 Cans of beer per week    Comment: 1 can of beer a week   Drug use: Never   Sexual activity: Not Currently  Other Topics Concern   Not on file  Social History Narrative   ** Merged History Encounter **       Grew up in [place], [place] and [place].  Retired.  Worked in [employer]. 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    Social Determinants of Health   Financial Resource Strain: Low Risk  (01/23/2017)   Overall Financial Resource Strain (CARDIA)     Difficulty of Paying Living Expenses: Not hard at all  Food Insecurity: No Food Insecurity (01/23/2017)   Hunger Vital Sign    Worried About Running Out of Food in the Last Year: Never true    Ran Out of Food in the Last Year: Never true  Transportation Needs: No Transportation Needs (01/23/2017)   PRAPARE - Hydrologist (Medical): No    Lack of Transportation (Non-Medical): No  Physical Activity: Unknown (01/23/2017)   Exercise Vital Sign    Days of Exercise per Week: 3 days    Minutes of Exercise per Session: Not on file  Stress: Stress Concern Present (01/23/2017)   [place]    Feeling of Stress : To some extent  Social Connections: Somewhat Isolated (01/23/2017)   Social Connection and Isolation Panel [NHANES]    Frequency of Communication with Friends and Family: More than three times a week    Frequency of Social Gatherings with Friends and Family: More than three times a week    Attends Religious Services: Never    Marine scientist or Organizations: No    Attends Archivist Meetings: Never    Marital Status: Married  Human resources officer Violence: Not At Risk (01/23/2017)   Humiliation, Afraid, Rape, and Kick questionnaire    Fear of Current or Ex-Partner: No    Emotionally Abused: No    Physically Abused: No    Sexually Abused: No    FAMILY HISTORY:  Family History  Problem Relation Age of Onset   Cancer Mother        pancreatic   Arthritis Father    Early death Father 10       Lung infiltrate   Colon cancer Neg Hx    Colon polyps Neg Hx     CURRENT MEDICATIONS:  Current Outpatient Medications  Medication Sig Dispense Refill   albuterol (PROVENTIL HFA;VENTOLIN HFA) 108 (90 Base) MCG/ACT inhaler Inhale 1-2 puffs into the lungs every 6 (six) hours as needed for wheezing or shortness of breath.     ALPRAZolam (XANAX) 0.5 MG tablet Take 0.5 mg by mouth 2 (two)  times daily as needed for anxiety.     amLODipine (NORVASC) 2.5 MG tablet Take 2.5 mg by mouth daily.     azaCITIDine 5 mg/2 mLs in lactated ringers infusion Inject into the vein daily. Days 1-7 every 28 days     fluticasone furoate-vilanterol (BREO ELLIPTA) 200-25 MCG/ACT AEPB Inhale 1 puff into the lungs daily.     Lidocaine-Hydrocortisone Ace 3-0.5 % CREA Apply 1 application topically as directed. (Patient taking differently: Apply 1 application. topically 2 (two) times daily as needed (hemorrhoids).) 30 Tube 11   lidocaine-prilocaine (  EMLA) cream Apply a small amount to port a cath site (do not rub in) and cover with plastic wrap 1 hour prior to infusion appointments 30 g 3   loratadine (CLARITIN) 10 MG tablet Take 10 mg by mouth daily.     Lutein 40 MG CAPS Take 40 mg by mouth daily.     Multiple Vitamins-Minerals (CENTRUM ADULTS PO) Take 1 tablet by mouth daily.     nystatin cream (MYCOSTATIN) Apply 1 application. topically 2 (two) times daily as needed for dry skin.     ofloxacin (OCUFLOX) 0.3 % ophthalmic solution Place 1 drop into the left eye See admin instructions. 1 drop to left eye 4 x daily for 7 days after monthly procedure.     omeprazole (PRILOSEC OTC) 20 MG tablet Take 5-10 mg by mouth daily.     prochlorperazine (COMPAZINE) 10 MG tablet Take 1 tablet (10 mg total) by mouth every 6 (six) hours as needed (Nausea or vomiting). 30 tablet 1   sildenafil (VIAGRA) 50 MG tablet Take 50 mg by mouth daily as needed for erectile dysfunction.     traMADol (ULTRAM) 50 MG tablet Take 1 tablet (50 mg total) by mouth every 6 (six) hours as needed. 15 tablet 0   No current facility-administered medications for this visit.   Facility-Administered Medications Ordered in Other Visits  Medication Dose Route Frequency Provider Last Rate Last Admin   sodium chloride flush (NS) 0.9 % injection 10 mL  10 mL Intracatheter PRN [name], MD   10 mL at 08/03/21 1250    ALLERGIES:  Allergies   Allergen Reactions   Cephalosporins Itching   Amoxapine And Related Itching and Rash    Has patient had a PCN reaction causing immediate rash, facial/tongue/throat swelling, SOB or lightheadedness with hypotension: No Has patient had a PCN reaction causing severe rash involving mucus membranes or skin necrosis: No Has patient had a PCN reaction that required hospitalization: No Has patient had a PCN reaction occurring within the last 10 years: No If all of the above answers are "NO", then may proceed with Cephalosporin use.     Amoxicillin Rash   Cephalexin Rash   Clindamycin/Lincomycin Itching and Rash   Sulfamethoxazole Itching and Rash   Trimethoprim Rash    PHYSICAL EXAM:  Performance status (ECOG): 1 - Symptomatic but completely ambulatory  There were no vitals filed for this visit. Wt Readings from Last 3 Encounters:  08/27/21 157 lb (71.2 kg)  08/06/21 153 lb 3.5 oz (69.5 kg)  07/30/21 156 lb (70.8 kg)   Physical Exam Vitals reviewed.  Constitutional:      Appearance: Normal appearance.  Cardiovascular:     Rate and Rhythm: Normal rate and regular rhythm.     Pulses: Normal pulses.     Heart sounds: Normal heart sounds.  Pulmonary:     Effort: Pulmonary effort is normal.     Breath sounds: Normal breath sounds.  Neurological:     General: No focal deficit present.     Mental Status: He is alert and oriented to person, place, and time.  Psychiatric:        Mood and Affect: Mood normal.        Behavior: Behavior normal.     LABORATORY DATA:  I have reviewed the labs as listed.     Latest Ref Rng & Units 08/27/2021   11:58 AM 08/16/2021    9:45 AM 08/06/2021   11:55 AM  CBC  WBC 4.0 -  10.5 K/uL 1.5  1.4  2.3   Hemoglobin 13.0 - 17.0 g/dL 10.5  10.1  10.7   Hematocrit 39.0 - 52.0 % 31.7  29.9  31.8   Platelets 150 - 400 K/uL 121  59  103       Latest Ref Rng & Units 08/27/2021   11:58 AM 08/06/2021   11:55 AM 07/30/2021    9:28 AM  CMP  Glucose 70 - 99  mg/dL 138  130  120   BUN 8 - 23 mg/dL _0 Creatinine 0.61 - 1.24 mg/dL 0.94  0.96  0.86   Sodium 135 - 145 mmol/L 137  139  139   Potassium 3.5 - 5.1 mmol/L 3.4  3.7  3.5   Chloride 98 - 111 mmol/L 110  106  109   CO2 22 - 32 mmol/L _1 Calcium 8.9 - 10.3 mg/dL 8.9  9.5  9.0   Total Protein 6.5 - 8.1 g/dL 6.1  6.2  6.3   Total Bilirubin 0.3 - 1.2 mg/dL 1.5  1.7  0.9   Alkaline Phos 38 - 126 U/L 62  57  62   AST 15 - 41 U/L _2 ALT 0 - 44 U/L _3 DIAGNOSTIC IMAGING:  I have independently reviewed the scans and discussed with the patient. No results found.   ASSESSMENT:  MDS with EB 2: - Seen at the request of Dr. Mannie Stabile for abnormal CBC. - Labs on 12/06/2020 shows white count 3.0 with 41% neutrophils, 41% lymphocytes, 13.8% monocytes.  ANC was 1.2.  Hemoglobin 13.4 and normal.  MCV was slightly high at 96.  Platelet count was 128.  Vitamin W10 and folic acid were normal. - He took antibiotics for teeth implants few times this year, last 1 on 11/16/2020.  He does not know the antibiotic name.  He also reports taking over-the-counter pill for memory for 5 to 6 months, which was stopped around September 2022. - Labs from 07/07/2020 with creatinine 0.92.  White count 3.3 (37% neutrophils, 45% lymphocytes, 14% monocytes, 3% eosinophils), ANC 1.2.  Platelet count 149. - His prior CBC from 12/08/2017 in our system was completely normal. - Reports slight worsening of the night sweats in the last 3 months.  At least once per week and has to change clothes.  No fevers or weight loss. - BMBX on 06/04/2021: Hypercellular marrow with dyspoietic changes involving the granulocytic cell line and megakaryocytes.  Myeloblasts 8% on aspirate smears and 10% by flow.  Cytogenetics 41, XY (20).  MDS FISH panel normal. 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 - IPSS-M score of 0.12, moderate high risk.  Leukemia free survival is 2.3 years.  Overall survival 2.8 years. - Cycle 1 of  azacitidine on 07/30/2021   Social/family history: - He lives at home with his wife.  He swims about 40 minutes daily. 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  Non-smoker. - Paternal aunt had breast cancer and mother had pancreatic cancer.  3.  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 - He underwent thrombolytic [facility] at [facility]. - He will followed up with vascular at [facility] and has been on Xarelto since then.   PLAN:  MDS with EB 2: - He  has tolerated first cycle of azacitidine very well. - He had some constipation from Aloxi.  Colace helped. - He denies any infections or other GI side effects. - Reviewed his labs which showed normal LFTs and creatinine.  Platelet count has improved to 121 from 96 prior to start of therapy.  White count is stable with ANC of 400. - Proceed with cycle 2 of azacitidine today.  Will increase dose to 65 mg/m2 x7 days.  RTC 4 weeks for follow-up with labs and treatment.   Unprovoked left subclavian DVT: - Xarelto was discontinued secondary to hemorrhoidal bleeding.   Orders placed this encounter:  No orders of the defined types were placed in this encounter.    [name], MD [place] [phone]   I, Thana Ates, am acting as a scribe for Dr. [name].  I, [name] MD, have reviewed the above documentation for accuracy and completeness, and I agree with the above.

## 2021-08-27 NOTE — Progress Notes (Signed)
Patient has been examined by Dr. Delton Coombes, and vital signs and labs have been reviewed. ANC (0.4), Creatinine, LFTs, hemoglobin, and platelets are within treatment parameters per M.D. - pt may proceed with treatment.

## 2021-08-27 NOTE — Patient Instructions (Signed)
[facility]  Discharge Instructions: Thank you for choosing [facility] to provide your oncology and hematology care.  If you have a lab appointment with the [facility], please come in thru the Main Entrance and check in at the main information desk.  Wear comfortable clothing and clothing appropriate for easy access to any Portacath or PICC line.   We strive to give you quality time with your provider. You may need to reschedule your appointment if you arrive late (15 or more minutes).  Arriving late affects you and other patients whose appointments are after yours.  Also, if you miss three or more appointments without notifying the office, you may be dismissed from the clinic at the provider's discretion.      For prescription refill requests, have your pharmacy contact our office and allow 72 hours for refills to be completed.    Today you received the following chemotherapy and/or immunotherapy agents Vidaza      To help prevent nausea and vomiting after your treatment, we encourage you to take your nausea medication as directed.  BELOW ARE SYMPTOMS THAT SHOULD BE REPORTED IMMEDIATELY: *FEVER GREATER THAN 100.4 F (38 C) OR HIGHER *CHILLS OR SWEATING *NAUSEA AND VOMITING THAT IS NOT CONTROLLED WITH YOUR NAUSEA MEDICATION *UNUSUAL SHORTNESS OF BREATH *UNUSUAL BRUISING OR BLEEDING *URINARY PROBLEMS (pain or burning when urinating, or frequent urination) *BOWEL PROBLEMS (unusual diarrhea, constipation, pain near the anus) TENDERNESS IN MOUTH AND THROAT WITH OR WITHOUT PRESENCE OF ULCERS (sore throat, sores in mouth, or a toothache) UNUSUAL RASH, SWELLING OR PAIN  UNUSUAL VAGINAL DISCHARGE OR ITCHING   Items with * indicate a potential emergency and should be followed up as soon as possible or go to the Emergency Department if any problems should occur.  Please show the CHEMOTHERAPY ALERT CARD or IMMUNOTHERAPY ALERT CARD at check-in to the Emergency  Department and triage nurse.  Should you have questions after your visit or need to cancel or reschedule your appointment, please contact [facility] [phone]  and follow the prompts.  Office hours are 8:00 a.m. to 4:30 p.m. Monday - Friday. Please note that voicemails left after 4:00 p.m. may not be returned until the following business day.  We are closed weekends and major holidays. You have access to a nurse at all times for urgent questions. Please call the main number to the clinic [phone] and follow the prompts.  For any non-urgent questions, you may also contact your provider using MyChart. We now offer e-Visits for anyone 72 and older to request care online for non-urgent symptoms. For details visit [email or web address].   Also download the MyChart app! Go to the app store, search "MyChart", open the app, select [facility], and log in with your MyChart username and password.  Due to Covid, a mask is required upon entering the hospital/clinic. If you do not have a mask, one will be given to you upon arrival. For doctor visits, patients may have 1 support person aged 15 or older with them. For treatment visits, patients cannot have anyone with them due to current Covid guidelines and our immunocompromised population.

## 2021-08-27 NOTE — Patient Instructions (Signed)
[place] at [facility] Discharge Instructions   You were seen and examined today by Dr. Delton Coombes.  He reviewed your lab work which is normal/stable.   We will proceed with your treatment today.  Return as scheduled.    Thank you for choosing [facility] at [facility] to provide your oncology and hematology care.  To afford each patient quality time with our provider, please arrive at least 15 minutes before your scheduled appointment time.   If you have a lab appointment with the [facility] please come in thru the Main Entrance and check in at the main information desk.  You need to re-schedule your appointment should you arrive 10 or more minutes late.  We strive to give you quality time with our providers, and arriving late affects you and other patients whose appointments are after yours.  Also, if you no show three or more times for appointments you may be dismissed from the clinic at the providers discretion.     Again, thank you for choosing [facility].  Our hope is that these requests will decrease the amount of time that you wait before being seen by our physicians.       _____________________________________________________________  Should you have questions after your visit to [facility], please contact our office at [phone] and follow the prompts.  Our office hours are 8:00 a.m. and 4:30 p.m. Monday - Friday.  Please note that voicemails left after 4:00 p.m. may not be returned until the following business day.  We are closed weekends and major holidays.  You do have access to a nurse 24-7, just call the main number to the clinic [phone] and do not press any options, hold on the line and a nurse will answer the phone.    For prescription refill requests, have your pharmacy contact our office and allow 72 hours.    Due to Covid, you will need to wear a mask upon entering the hospital. If  you do not have a mask, a mask will be given to you at the Main Entrance upon arrival. For doctor visits, patients may have 1 support person age 72 or older with them. For treatment visits, patients can not have anyone with them due to social distancing guidelines and our immunocompromised population.

## 2021-08-28 ENCOUNTER — Inpatient Hospital Stay (HOSPITAL_COMMUNITY): Payer: Medicare HMO

## 2021-08-28 VITALS — BP 125/76 | HR 65 | Temp 97.2°F | Resp 20

## 2021-08-28 DIAGNOSIS — Z95828 Presence of other vascular implants and grafts: Secondary | ICD-10-CM

## 2021-08-28 DIAGNOSIS — D469 Myelodysplastic syndrome, unspecified: Secondary | ICD-10-CM

## 2021-08-28 DIAGNOSIS — Z5111 Encounter for antineoplastic chemotherapy: Secondary | ICD-10-CM | POA: Diagnosis not present

## 2021-08-28 MED ORDER — SODIUM CHLORIDE 0.9 % IV SOLN: Freq: Once | INTRAVENOUS | Status: AC

## 2021-08-28 MED ORDER — HEPARIN SOD (PORK) LOCK FLUSH 100 UNIT/ML IV SOLN
500.0000 [IU] | Freq: Once | INTRAVENOUS | Status: AC | PRN
  Administered 2021-08-28: 500 [IU]

## 2021-08-28 MED ORDER — SODIUM CHLORIDE 0.9 % IV SOLN
10.0000 mg | Freq: Once | INTRAVENOUS | Status: AC
  Administered 2021-08-28: 10 mg via INTRAVENOUS
  Filled 2021-08-28: qty 10

## 2021-08-28 MED ORDER — SODIUM CHLORIDE 0.9 % IV SOLN
65.0000 mg/m2 | Freq: Once | INTRAVENOUS | Status: AC
  Administered 2021-08-28: 120 mg via INTRAVENOUS
  Filled 2021-08-28: qty 12

## 2021-08-28 MED ORDER — SODIUM CHLORIDE 0.9% FLUSH
10.0000 mL | INTRAVENOUS | Status: DC | PRN
  Administered 2021-08-28: 10 mL

## 2021-08-28 NOTE — Patient Instructions (Signed)
[facility]  Discharge Instructions: Thank you for choosing [facility] to provide your oncology and hematology care.  If you have a lab appointment with the [facility], please come in thru the Main Entrance and check in at the main information desk.  Wear comfortable clothing and clothing appropriate for easy access to any Portacath or PICC line.   We strive to give you quality time with your provider. You may need to reschedule your appointment if you arrive late (15 or more minutes).  Arriving late affects you and other patients whose appointments are after yours.  Also, if you miss three or more appointments without notifying the office, you may be dismissed from the clinic at the provider's discretion.      For prescription refill requests, have your pharmacy contact our office and allow 72 hours for refills to be completed.    Today you received the following chemotherapy and/or immunotherapy agents Vidaza, return as scheduled.   To help prevent nausea and vomiting after your treatment, we encourage you to take your nausea medication as directed.  BELOW ARE SYMPTOMS THAT SHOULD BE REPORTED IMMEDIATELY: *FEVER GREATER THAN 100.4 F (38 C) OR HIGHER *CHILLS OR SWEATING *NAUSEA AND VOMITING THAT IS NOT CONTROLLED WITH YOUR NAUSEA MEDICATION *UNUSUAL SHORTNESS OF BREATH *UNUSUAL BRUISING OR BLEEDING *URINARY PROBLEMS (pain or burning when urinating, or frequent urination) *BOWEL PROBLEMS (unusual diarrhea, constipation, pain near the anus) TENDERNESS IN MOUTH AND THROAT WITH OR WITHOUT PRESENCE OF ULCERS (sore throat, sores in mouth, or a toothache) UNUSUAL RASH, SWELLING OR PAIN  UNUSUAL VAGINAL DISCHARGE OR ITCHING   Items with * indicate a potential emergency and should be followed up as soon as possible or go to the Emergency Department if any problems should occur.  Please show the CHEMOTHERAPY ALERT CARD or IMMUNOTHERAPY ALERT CARD at check-in to the  Emergency Department and triage nurse.  Should you have questions after your visit or need to cancel or reschedule your appointment, please contact [facility] [phone]  and follow the prompts.  Office hours are 8:00 a.m. to 4:30 p.m. Monday - Friday. Please note that voicemails left after 4:00 p.m. may not be returned until the following business day.  We are closed weekends and major holidays. You have access to a nurse at all times for urgent questions. Please call the main number to the clinic [phone] and follow the prompts.  For any non-urgent questions, you may also contact your provider using MyChart. We now offer e-Visits for anyone 77 and older to request care online for non-urgent symptoms. For details visit [email or web address].   Also download the MyChart app! Go to the app store, search "MyChart", open the app, select [place], and log in with your MyChart username and password.  Masks are optional in the cancer centers. If you would like for your care team to wear a mask while they are taking care of you, please let them know. For doctor visits, patients may have with them one support person who is at least 77 years old. At this time, visitors are not allowed in the infusion area.

## 2021-08-28 NOTE — Progress Notes (Signed)
Patient tolerated chemotherapy with no complaints voiced. Side effects with management reviewed understanding verbalized. Port site clean and dry with no bruising or swelling noted at site. Good blood return noted before and after administration of chemotherapy. Band aid applied. Patient left in satisfactory condition with VSS and no s/s of distress noted.

## 2021-08-29 ENCOUNTER — Inpatient Hospital Stay (HOSPITAL_COMMUNITY): Payer: Medicare HMO

## 2021-08-29 ENCOUNTER — Encounter (HOSPITAL_COMMUNITY): Payer: Self-pay

## 2021-08-29 VITALS — BP 132/87 | HR 70 | Temp 97.8°F | Resp 18 | Wt 157.6 lb

## 2021-08-29 DIAGNOSIS — Z95828 Presence of other vascular implants and grafts: Secondary | ICD-10-CM

## 2021-08-29 DIAGNOSIS — Z5111 Encounter for antineoplastic chemotherapy: Secondary | ICD-10-CM | POA: Diagnosis not present

## 2021-08-29 DIAGNOSIS — D469 Myelodysplastic syndrome, unspecified: Secondary | ICD-10-CM

## 2021-08-29 MED ORDER — PALONOSETRON HCL INJECTION 0.25 MG/5ML
0.2500 mg | Freq: Once | INTRAVENOUS | Status: AC
  Administered 2021-08-29: 0.25 mg via INTRAVENOUS
  Filled 2021-08-29: qty 5

## 2021-08-29 MED ORDER — SODIUM CHLORIDE 0.9% FLUSH
10.0000 mL | INTRAVENOUS | Status: DC | PRN
  Administered 2021-08-29: 10 mL

## 2021-08-29 MED ORDER — SODIUM CHLORIDE 0.9 % IV SOLN
10.0000 mg | Freq: Once | INTRAVENOUS | Status: AC
  Administered 2021-08-29: 10 mg via INTRAVENOUS
  Filled 2021-08-29: qty 10

## 2021-08-29 MED ORDER — HEPARIN SOD (PORK) LOCK FLUSH 100 UNIT/ML IV SOLN
500.0000 [IU] | Freq: Once | INTRAVENOUS | Status: AC | PRN
  Administered 2021-08-29: 500 [IU]

## 2021-08-29 MED ORDER — SODIUM CHLORIDE 0.9 % IV SOLN: Freq: Once | INTRAVENOUS | Status: AC

## 2021-08-29 MED ORDER — SODIUM CHLORIDE 0.9 % IV SOLN
65.0000 mg/m2 | Freq: Once | INTRAVENOUS | Status: AC
  Administered 2021-08-29: 120 mg via INTRAVENOUS
  Filled 2021-08-29: qty 12

## 2021-08-29 NOTE — Patient Instructions (Signed)
[facility]  Discharge Instructions: Thank you for choosing [facility] to provide your oncology and hematology care.  If you have a lab appointment with the [facility], please come in thru the Main Entrance and check in at the main information desk.  Wear comfortable clothing and clothing appropriate for easy access to any Portacath or PICC line.   We strive to give you quality time with your provider. You may need to reschedule your appointment if you arrive late (15 or more minutes).  Arriving late affects you and other patients whose appointments are after yours.  Also, if you miss three or more appointments without notifying the office, you may be dismissed from the clinic at the provider's discretion.      For prescription refill requests, have your pharmacy contact our office and allow 72 hours for refills to be completed.    Today you received the following chemotherapy and/or immunotherapy agents Vidaza, return as scheduled.   To help prevent nausea and vomiting after your treatment, we encourage you to take your nausea medication as directed.  BELOW ARE SYMPTOMS THAT SHOULD BE REPORTED IMMEDIATELY: *FEVER GREATER THAN 100.4 F (38 C) OR HIGHER *CHILLS OR SWEATING *NAUSEA AND VOMITING THAT IS NOT CONTROLLED WITH YOUR NAUSEA MEDICATION *UNUSUAL SHORTNESS OF BREATH *UNUSUAL BRUISING OR BLEEDING *URINARY PROBLEMS (pain or burning when urinating, or frequent urination) *BOWEL PROBLEMS (unusual diarrhea, constipation, pain near the anus) TENDERNESS IN MOUTH AND THROAT WITH OR WITHOUT PRESENCE OF ULCERS (sore throat, sores in mouth, or a toothache) UNUSUAL RASH, SWELLING OR PAIN  UNUSUAL VAGINAL DISCHARGE OR ITCHING   Items with * indicate a potential emergency and should be followed up as soon as possible or go to the Emergency Department if any problems should occur.  Please show the CHEMOTHERAPY ALERT CARD or IMMUNOTHERAPY ALERT CARD at check-in to the  Emergency Department and triage nurse.  Should you have questions after your visit or need to cancel or reschedule your appointment, please contact [facility] [phone]  and follow the prompts.  Office hours are 8:00 a.m. to 4:30 p.m. Monday - Friday. Please note that voicemails left after 4:00 p.m. may not be returned until the following business day.  We are closed weekends and major holidays. You have access to a nurse at all times for urgent questions. Please call the main number to the clinic [phone] and follow the prompts.  For any non-urgent questions, you may also contact your provider using MyChart. We now offer e-Visits for anyone 86 and older to request care online for non-urgent symptoms. For details visit [email or web address].   Also download the MyChart app! Go to the app store, search "MyChart", open the app, select [facility], and log in with your MyChart username and password.  Masks are optional in the cancer centers. If you would like for your care team to wear a mask while they are taking care of you, please let them know. For doctor visits, patients may have with them one support person who is at least 76 years old. At this time, visitors are not allowed in the infusion area.

## 2021-08-29 NOTE — Progress Notes (Signed)
Patient tolerated chemotherapy with no complaints voiced. Side effects with management reviewed understanding verbalized. Port site clean and dry with no bruising or swelling noted at site. Good blood return noted before and after administration of chemotherapy. Band aid applied. Patient left in satisfactory condition with VSS and no s/s of distress noted.

## 2021-08-30 ENCOUNTER — Inpatient Hospital Stay (HOSPITAL_COMMUNITY): Payer: Medicare HMO

## 2021-08-30 VITALS — BP 114/71 | HR 64 | Temp 97.8°F | Resp 18

## 2021-08-30 DIAGNOSIS — Z95828 Presence of other vascular implants and grafts: Secondary | ICD-10-CM

## 2021-08-30 DIAGNOSIS — D469 Myelodysplastic syndrome, unspecified: Secondary | ICD-10-CM

## 2021-08-30 DIAGNOSIS — Z5111 Encounter for antineoplastic chemotherapy: Secondary | ICD-10-CM | POA: Diagnosis not present

## 2021-08-30 MED ORDER — SODIUM CHLORIDE 0.9 % IV SOLN
10.0000 mg | Freq: Once | INTRAVENOUS | Status: AC
  Administered 2021-08-30: 10 mg via INTRAVENOUS
  Filled 2021-08-30: qty 1

## 2021-08-30 MED ORDER — SODIUM CHLORIDE 0.9 % IV SOLN
65.0000 mg/m2 | Freq: Once | INTRAVENOUS | Status: AC
  Administered 2021-08-30: 120 mg via INTRAVENOUS
  Filled 2021-08-30: qty 12

## 2021-08-30 MED ORDER — HEPARIN SOD (PORK) LOCK FLUSH 100 UNIT/ML IV SOLN
500.0000 [IU] | Freq: Once | INTRAVENOUS | Status: AC | PRN
  Administered 2021-08-30: 500 [IU]

## 2021-08-30 MED ORDER — SODIUM CHLORIDE 0.9% FLUSH
10.0000 mL | INTRAVENOUS | Status: DC | PRN
  Administered 2021-08-30: 10 mL

## 2021-08-30 MED ORDER — SODIUM CHLORIDE 0.9 % IV SOLN: Freq: Once | INTRAVENOUS | Status: AC

## 2021-08-30 NOTE — Progress Notes (Signed)
Pt presents today for Vidaza per provider's order. Vital signs stable and pt voiced no new complaints at this time.  Vidaza given today per MD orders. Tolerated infusion without adverse affects. Vital signs stable. No complaints at this time. Discharged from clinic ambulatory in stable condition. Alert and oriented x 3. F/U with [facility] as scheduled.

## 2021-08-30 NOTE — Patient Instructions (Signed)
Chad Lamb  Discharge Instructions: Thank you for choosing [facility] to provide your oncology and hematology care.  If you have a lab appointment with the [facility], please come in thru the Main Entrance and check in at the main information desk.  Wear comfortable clothing and clothing appropriate for easy access to any Portacath or PICC line.   We strive to give you quality time with your provider. You may need to reschedule your appointment if you arrive late (15 or more minutes).  Arriving late affects you and other patients whose appointments are after yours.  Also, if you miss three or more appointments without notifying the office, you may be dismissed from the clinic at the provider's discretion.      For prescription refill requests, have your pharmacy contact our office and allow 72 hours for refills to be completed.    Today you received the following chemotherapy and/or immunotherapy agents Vidaza.   To help prevent nausea and vomiting after your treatment, we encourage you to take your nausea medication as directed.  BELOW ARE SYMPTOMS THAT SHOULD BE REPORTED IMMEDIATELY: *FEVER GREATER THAN 100.4 F (38 C) OR HIGHER *CHILLS OR SWEATING *NAUSEA AND VOMITING THAT IS NOT CONTROLLED WITH YOUR NAUSEA MEDICATION *UNUSUAL SHORTNESS OF BREATH *UNUSUAL BRUISING OR BLEEDING *URINARY PROBLEMS (pain or burning when urinating, or frequent urination) *BOWEL PROBLEMS (unusual diarrhea, constipation, pain near the anus) TENDERNESS IN MOUTH AND THROAT WITH OR WITHOUT PRESENCE OF ULCERS (sore throat, sores in mouth, or a toothache) UNUSUAL RASH, SWELLING OR PAIN  UNUSUAL VAGINAL DISCHARGE OR ITCHING   Items with * indicate a potential emergency and should be followed up as soon as possible or go to the Emergency Department if any problems should occur.  Please show the CHEMOTHERAPY ALERT CARD or IMMUNOTHERAPY ALERT CARD at check-in to the Emergency Department  and triage nurse.  Should you have questions after your visit or need to cancel or reschedule your appointment, please contact [facility] [phone]  and follow the prompts.  Office hours are 8:00 a.m. to 4:30 p.m. Monday - Friday. Please note that voicemails left after 4:00 p.m. may not be returned until the following business day.  We are closed weekends and major holidays. You have access to a nurse at all times for urgent questions. Please call the main number to the clinic [phone] and follow the prompts.  For any non-urgent questions, you may also contact your provider using MyChart. We now offer e-Visits for anyone 21 and older to request care online for non-urgent symptoms. For details visit [email or web address].   Also download the MyChart app! Go to the app store, search "MyChart", open the app, select [facility], and log in with your MyChart username and password.  Masks are optional in the cancer centers. If you would like for your care team to wear a mask while they are taking care of you, please let them know. For doctor visits, patients may have with them one support person who is at least 77 years old. At this time, visitors are not allowed in the infusion area.  Azacitidine suspension for injection (subcutaneous use) What is this medication? AZACITIDINE (ay [place]) is a chemotherapy drug. This medicine reduces the growth of cancer cells and can suppress the immune system. It is used for treating myelodysplastic syndrome or some types of leukemia. This medicine may be used for other purposes; ask your health care provider or pharmacist if  you have questions. COMMON BRAND NAME(S): Vidaza What should I tell my care team before I take this medication? They need to know if you have any of these conditions: kidney disease liver disease liver tumors an unusual or allergic reaction to azacitidine, mannitol, other medicines, foods, dyes, or  preservatives pregnant or trying to get pregnant breast-feeding How should I use this medication? This medicine is for injection under the skin. It is administered in a hospital or clinic by a specially trained health care professional. Talk to your pediatrician regarding the use of this medicine in children. While this drug may be prescribed for selected conditions, precautions do apply. Overdosage: If you think you have taken too much of this medicine contact a poison control center or emergency room at once. NOTE: This medicine is only for you. Do not share this medicine with others. What if I miss a dose? It is important not to miss your dose. Call your doctor or health care professional if you are unable to keep an appointment. What may interact with this medication? Interactions have not been studied. Give your health care provider a list of all the medicines, herbs, non-prescription drugs, or dietary supplements you use. Also tell them if you smoke, drink alcohol, or use illegal drugs. Some items may interact with your medicine. This list may not describe all possible interactions. Give your health care provider a list of all the medicines, herbs, non-prescription drugs, or dietary supplements you use. Also tell them if you smoke, drink alcohol, or use illegal drugs. Some items may interact with your medicine. What should I watch for while using this medication? Visit your doctor for checks on your progress. This drug may make you feel generally unwell. This is not uncommon, as chemotherapy can affect healthy cells as well as cancer cells. Report any side effects. Continue your course of treatment even though you feel ill unless your doctor tells you to stop. In some cases, you may be given additional medicines to help with side effects. Follow all directions for their use. Call your doctor or health care professional for advice if you get a fever, chills or sore throat, or other symptoms of  a cold or flu. Do not treat yourself. This drug decreases your body's ability to fight infections. Try to avoid being around people who are sick. This medicine may increase your risk to bruise or bleed. Call your doctor or health care professional if you notice any unusual bleeding. You may need blood work done while you are taking this medicine. Do not become pregnant while taking this medicine and for 6 months after the last dose. Women should inform their doctor if they wish to become pregnant or think they might be pregnant. Men should not father a child while taking this medicine and for 3 months after the last dose. There is a potential for serious side effects to an unborn child. Talk to your health care professional or pharmacist for more information. Do not breast-feed an infant while taking this medicine and for 1 week after the last dose. This medicine may interfere with the ability to have a child. Talk with your doctor or health care professional if you are concerned about your fertility. What side effects may I notice from receiving this medication? Side effects that you should report to your doctor or health care professional as soon as possible: allergic reactions like skin rash, itching or hives, swelling of the face, lips, or tongue low blood counts - this  medicine may decrease the number of white blood cells, red blood cells and platelets. You may be at increased risk for infections and bleeding. signs of infection - fever or chills, cough, sore throat, pain passing urine signs of decreased platelets or bleeding - bruising, pinpoint red spots on the skin, black, tarry stools, blood in the urine signs of decreased red blood cells - unusually weak or tired, fainting spells, lightheadedness signs and symptoms of kidney injury like trouble passing urine or change in the amount of urine signs and symptoms of liver injury like dark yellow or brown urine; general ill feeling or flu-like  symptoms; light-colored stools; loss of appetite; nausea; right upper belly pain; unusually weak or tired; yellowing of the eyes or skin Side effects that usually do not require medical attention (report to your doctor or health care professional if they continue or are bothersome): constipation diarrhea nausea, vomiting pain or redness at the injection site unusually weak or tired This list may not describe all possible side effects. Call your doctor for medical advice about side effects. You may report side effects to FDA at 1-800-FDA-1088. Where should I keep my medication? This drug is given in a hospital or clinic and will not be stored at home. NOTE: This sheet is a summary. It may not cover all possible information. If you have questions about this medicine, talk to your doctor, pharmacist, or health care provider.  2023 Elsevier/Gold Standard (2016-04-03 00:00:00)

## 2021-08-31 ENCOUNTER — Inpatient Hospital Stay (HOSPITAL_COMMUNITY): Payer: Medicare HMO

## 2021-08-31 VITALS — BP 118/75 | HR 64 | Temp 98.6°F | Resp 16

## 2021-08-31 DIAGNOSIS — D469 Myelodysplastic syndrome, unspecified: Secondary | ICD-10-CM

## 2021-08-31 DIAGNOSIS — Z95828 Presence of other vascular implants and grafts: Secondary | ICD-10-CM

## 2021-08-31 DIAGNOSIS — Z5111 Encounter for antineoplastic chemotherapy: Secondary | ICD-10-CM | POA: Diagnosis not present

## 2021-08-31 MED ORDER — SODIUM CHLORIDE 0.9 % IV SOLN
65.0000 mg/m2 | Freq: Once | INTRAVENOUS | Status: AC
  Administered 2021-08-31: 120 mg via INTRAVENOUS
  Filled 2021-08-31: qty 12

## 2021-08-31 MED ORDER — PALONOSETRON HCL INJECTION 0.25 MG/5ML
0.2500 mg | Freq: Once | INTRAVENOUS | Status: AC
  Administered 2021-08-31: 0.25 mg via INTRAVENOUS
  Filled 2021-08-31: qty 5

## 2021-08-31 MED ORDER — SODIUM CHLORIDE 0.9% FLUSH
10.0000 mL | INTRAVENOUS | Status: DC | PRN
  Administered 2021-08-31: 10 mL

## 2021-08-31 MED ORDER — SODIUM CHLORIDE 0.9 % IV SOLN: Freq: Once | INTRAVENOUS | Status: AC

## 2021-08-31 MED ORDER — SODIUM CHLORIDE 0.9 % IV SOLN
10.0000 mg | Freq: Once | INTRAVENOUS | Status: AC
  Administered 2021-08-31: 10 mg via INTRAVENOUS
  Filled 2021-08-31: qty 1

## 2021-08-31 MED ORDER — HEPARIN SOD (PORK) LOCK FLUSH 100 UNIT/ML IV SOLN
500.0000 [IU] | Freq: Once | INTRAVENOUS | Status: AC | PRN
  Administered 2021-08-31: 500 [IU]

## 2021-08-31 NOTE — Patient Instructions (Signed)
[facility]  Discharge Instructions: Thank you for choosing [facility] to provide your oncology and hematology care.  If you have a lab appointment with the [facility], please come in thru the Main Entrance and check in at the main information desk.  Wear comfortable clothing and clothing appropriate for easy access to any Portacath or PICC line.   We strive to give you quality time with your provider. You may need to reschedule your appointment if you arrive late (15 or more minutes).  Arriving late affects you and other patients whose appointments are after yours.  Also, if you miss three or more appointments without notifying the office, you may be dismissed from the clinic at the provider's discretion.      For prescription refill requests, have your pharmacy contact our office and allow 72 hours for refills to be completed.    Today you received the following chemotherapy and/or immunotherapy agents vidaza      To help prevent nausea and vomiting after your treatment, we encourage you to take your nausea medication as directed.  BELOW ARE SYMPTOMS THAT SHOULD BE REPORTED IMMEDIATELY: *FEVER GREATER THAN 100.4 F (38 C) OR HIGHER *CHILLS OR SWEATING *NAUSEA AND VOMITING THAT IS NOT CONTROLLED WITH YOUR NAUSEA MEDICATION *UNUSUAL SHORTNESS OF BREATH *UNUSUAL BRUISING OR BLEEDING *URINARY PROBLEMS (pain or burning when urinating, or frequent urination) *BOWEL PROBLEMS (unusual diarrhea, constipation, pain near the anus) TENDERNESS IN MOUTH AND THROAT WITH OR WITHOUT PRESENCE OF ULCERS (sore throat, sores in mouth, or a toothache) UNUSUAL RASH, SWELLING OR PAIN  UNUSUAL VAGINAL DISCHARGE OR ITCHING   Items with * indicate a potential emergency and should be followed up as soon as possible or go to the Emergency Department if any problems should occur.  Please show the CHEMOTHERAPY ALERT CARD or IMMUNOTHERAPY ALERT CARD at check-in to the Emergency  Department and triage nurse.  Should you have questions after your visit or need to cancel or reschedule your appointment, please contact [facility] [phone]  and follow the prompts.  Office hours are 8:00 a.m. to 4:30 p.m. Monday - Friday. Please note that voicemails left after 4:00 p.m. may not be returned until the following business day.  We are closed weekends and major holidays. You have access to a nurse at all times for urgent questions. Please call the main number to the clinic [phone] and follow the prompts.  For any non-urgent questions, you may also contact your provider using MyChart. We now offer e-Visits for anyone 77 and older to request care online for non-urgent symptoms. For details visit [email or web address].   Also download the MyChart app! Go to the app store, search "MyChart", open the app, select [facility], and log in with your MyChart username and password.  Masks are optional in the cancer centers. If you would like for your care team to wear a mask while they are taking care of you, please let them know. For doctor visits, patients may have with them one support person who is at least 77 years old. At this time, visitors are not allowed in the infusion area.

## 2021-08-31 NOTE — Progress Notes (Signed)
Treatment given per orders. Patient tolerated it well without problems. Vitals stable and discharged home from clinic ambulatory. Follow up as scheduled.

## 2021-09-03 ENCOUNTER — Inpatient Hospital Stay (HOSPITAL_COMMUNITY): Payer: Medicare HMO

## 2021-09-03 VITALS — BP 122/77 | HR 65 | Temp 97.7°F | Resp 18

## 2021-09-03 DIAGNOSIS — Z95828 Presence of other vascular implants and grafts: Secondary | ICD-10-CM

## 2021-09-03 DIAGNOSIS — D46Z Other myelodysplastic syndromes: Secondary | ICD-10-CM

## 2021-09-03 DIAGNOSIS — Z5111 Encounter for antineoplastic chemotherapy: Secondary | ICD-10-CM | POA: Diagnosis not present

## 2021-09-03 DIAGNOSIS — D696 Thrombocytopenia, unspecified: Secondary | ICD-10-CM

## 2021-09-03 DIAGNOSIS — D469 Myelodysplastic syndrome, unspecified: Secondary | ICD-10-CM

## 2021-09-03 LAB — CBC WITH DIFFERENTIAL/PLATELET
Abs Immature Granulocytes: 0 10*3/uL (ref 0.00–0.07)
Basophils Absolute: 0 10*3/uL (ref 0.0–0.1)
Basophils Relative: 1 %
Eosinophils Absolute: 0.1 10*3/uL (ref 0.0–0.5)
Eosinophils Relative: 4 %
HCT: 31.7 % — ABNORMAL LOW (ref 39.0–52.0)
Hemoglobin: 10.7 g/dL — ABNORMAL LOW (ref 13.0–17.0)
Immature Granulocytes: 0 %
Lymphocytes Relative: 69 %
Lymphs Abs: 1.5 10*3/uL (ref 0.7–4.0)
MCH: 32.5 pg (ref 26.0–34.0)
MCHC: 33.8 g/dL (ref 30.0–36.0)
MCV: 96.4 fL (ref 80.0–100.0)
Monocytes Absolute: 0.1 10*3/uL (ref 0.1–1.0)
Monocytes Relative: 4 %
Neutro Abs: 0.5 10*3/uL — ABNORMAL LOW (ref 1.7–7.7)
Neutrophils Relative %: 22 %
Platelets: 143 10*3/uL — ABNORMAL LOW (ref 150–400)
RBC: 3.29 MIL/uL — ABNORMAL LOW (ref 4.22–5.81)
RDW: 15.1 % (ref 11.5–15.5)
WBC: 2.2 10*3/uL — ABNORMAL LOW (ref 4.0–10.5)
nRBC: 0 % (ref 0.0–0.2)

## 2021-09-03 LAB — COMPREHENSIVE METABOLIC PANEL
ALT: 13 U/L (ref 0–44)
AST: 16 U/L (ref 15–41)
Albumin: 3.5 g/dL (ref 3.5–5.0)
Alkaline Phosphatase: 57 U/L (ref 38–126)
Anion gap: 7 (ref 5–15)
BUN: 18 mg/dL (ref 8–23)
CO2: 27 mmol/L (ref 22–32)
Calcium: 9.3 mg/dL (ref 8.9–10.3)
Chloride: 104 mmol/L (ref 98–111)
Creatinine, Ser: 0.97 mg/dL (ref 0.61–1.24)
GFR, Estimated: >60 mL/min (ref >60)
Glucose, Bld: 127 mg/dL — ABNORMAL HIGH (ref 70–99)
Potassium: 3.6 mmol/L (ref 3.5–5.1)
Sodium: 138 mmol/L (ref 135–145)
Total Bilirubin: 1.4 mg/dL — ABNORMAL HIGH (ref 0.3–1.2)
Total Protein: 6.1 g/dL — ABNORMAL LOW (ref 6.5–8.1)

## 2021-09-03 LAB — MAGNESIUM: Magnesium: 2.2 mg/dL (ref 1.7–2.4)

## 2021-09-03 MED ORDER — SODIUM CHLORIDE 0.9 % IV SOLN: Freq: Once | INTRAVENOUS | Status: AC

## 2021-09-03 MED ORDER — SODIUM CHLORIDE 0.9 % IV SOLN
65.0000 mg/m2 | Freq: Once | INTRAVENOUS | Status: AC
  Administered 2021-09-03: 120 mg via INTRAVENOUS
  Filled 2021-09-03: qty 12

## 2021-09-03 MED ORDER — SODIUM CHLORIDE 0.9 % IV SOLN
10.0000 mg | Freq: Once | INTRAVENOUS | Status: AC
  Administered 2021-09-03: 10 mg via INTRAVENOUS
  Filled 2021-09-03: qty 1

## 2021-09-03 MED ORDER — SODIUM CHLORIDE 0.9% FLUSH
10.0000 mL | INTRAVENOUS | Status: DC | PRN
  Administered 2021-09-03: 10 mL

## 2021-09-03 MED ORDER — HEPARIN SOD (PORK) LOCK FLUSH 100 UNIT/ML IV SOLN
500.0000 [IU] | Freq: Once | INTRAVENOUS | Status: AC | PRN
  Administered 2021-09-03: 500 [IU]

## 2021-09-03 MED ORDER — PALONOSETRON HCL INJECTION 0.25 MG/5ML
0.2500 mg | Freq: Once | INTRAVENOUS | Status: AC
  Administered 2021-09-03: 0.25 mg via INTRAVENOUS
  Filled 2021-09-03: qty 5

## 2021-09-03 NOTE — Patient Instructions (Signed)
[facility]  Discharge Instructions: Thank you for choosing [facility] to provide your oncology and hematology care.  If you have a lab appointment with the [facility], please come in thru the Main Entrance and check in at the main information desk.  Wear comfortable clothing and clothing appropriate for easy access to any Portacath or PICC line.   We strive to give you quality time with your provider. You may need to reschedule your appointment if you arrive late (15 or more minutes).  Arriving late affects you and other patients whose appointments are after yours.  Also, if you miss three or more appointments without notifying the office, you may be dismissed from the clinic at the provider's discretion.      For prescription refill requests, have your pharmacy contact our office and allow 72 hours for refills to be completed.    Today you received the following chemotherapy and/or immunotherapy agents : Vidaza      To help prevent nausea and vomiting after your treatment, we encourage you to take your nausea medication as directed.  BELOW ARE SYMPTOMS THAT SHOULD BE REPORTED IMMEDIATELY: *FEVER GREATER THAN 100.4 F (38 C) OR HIGHER *CHILLS OR SWEATING *NAUSEA AND VOMITING THAT IS NOT CONTROLLED WITH YOUR NAUSEA MEDICATION *UNUSUAL SHORTNESS OF BREATH *UNUSUAL BRUISING OR BLEEDING *URINARY PROBLEMS (pain or burning when urinating, or frequent urination) *BOWEL PROBLEMS (unusual diarrhea, constipation, pain near the anus) TENDERNESS IN MOUTH AND THROAT WITH OR WITHOUT PRESENCE OF ULCERS (sore throat, sores in mouth, or a toothache) UNUSUAL RASH, SWELLING OR PAIN  UNUSUAL VAGINAL DISCHARGE OR ITCHING   Items with * indicate a potential emergency and should be followed up as soon as possible or go to the Emergency Department if any problems should occur.  Please show the CHEMOTHERAPY ALERT CARD or IMMUNOTHERAPY ALERT CARD at check-in to the Emergency  Department and triage nurse.  Should you have questions after your visit or need to cancel or reschedule your appointment, please contact [facility] [phone]  and follow the prompts.  Office hours are 8:00 a.m. to 4:30 p.m. Monday - Friday. Please note that voicemails left after 4:00 p.m. may not be returned until the following business day.  We are closed weekends and major holidays. You have access to a nurse at all times for urgent questions. Please call the main number to the clinic [phone] and follow the prompts.  For any non-urgent questions, you may also contact your provider using MyChart. We now offer e-Visits for anyone 77 and older to request care online for non-urgent symptoms. For details visit [email or web address].   Also download the MyChart app! Go to the app store, search "MyChart", open the app, select [facility], and log in with your MyChart username and password.  Masks are optional in the cancer centers. If you would like for your care team to wear a mask while they are taking care of you, please let them know. For doctor visits, patients may have with them one support person who is at least 77 years old. At this time, visitors are not allowed in the infusion area.

## 2021-09-03 NOTE — Progress Notes (Signed)
Okay to treat with ANC 0.5 per Dr. Raliegh Ip

## 2021-09-04 ENCOUNTER — Inpatient Hospital Stay (HOSPITAL_COMMUNITY): Payer: Medicare HMO

## 2021-09-04 VITALS — BP 125/84 | HR 74 | Temp 97.5°F | Resp 18

## 2021-09-04 DIAGNOSIS — Z5111 Encounter for antineoplastic chemotherapy: Secondary | ICD-10-CM | POA: Diagnosis not present

## 2021-09-04 DIAGNOSIS — D469 Myelodysplastic syndrome, unspecified: Secondary | ICD-10-CM

## 2021-09-04 DIAGNOSIS — Z95828 Presence of other vascular implants and grafts: Secondary | ICD-10-CM

## 2021-09-04 MED ORDER — SODIUM CHLORIDE 0.9 % IV SOLN
65.0000 mg/m2 | Freq: Once | INTRAVENOUS | Status: AC
  Administered 2021-09-04: 120 mg via INTRAVENOUS
  Filled 2021-09-04: qty 12

## 2021-09-04 MED ORDER — HEPARIN SOD (PORK) LOCK FLUSH 100 UNIT/ML IV SOLN
500.0000 [IU] | Freq: Once | INTRAVENOUS | Status: AC | PRN
  Administered 2021-09-04: 500 [IU]

## 2021-09-04 MED ORDER — SODIUM CHLORIDE 0.9% FLUSH
10.0000 mL | INTRAVENOUS | Status: DC | PRN
  Administered 2021-09-04: 10 mL

## 2021-09-04 MED ORDER — SODIUM CHLORIDE 0.9 % IV SOLN
10.0000 mg | Freq: Once | INTRAVENOUS | Status: AC
  Administered 2021-09-04: 10 mg via INTRAVENOUS
  Filled 2021-09-04: qty 1

## 2021-09-04 MED ORDER — SODIUM CHLORIDE 0.9 % IV SOLN: Freq: Once | INTRAVENOUS | Status: AC

## 2021-09-24 ENCOUNTER — Inpatient Hospital Stay (HOSPITAL_COMMUNITY): Payer: Medicare HMO

## 2021-09-24 ENCOUNTER — Inpatient Hospital Stay (HOSPITAL_BASED_OUTPATIENT_CLINIC_OR_DEPARTMENT_OTHER): Payer: Medicare HMO | Admitting: Hematology

## 2021-09-24 ENCOUNTER — Inpatient Hospital Stay (HOSPITAL_COMMUNITY): Payer: Medicare HMO | Attending: Hematology

## 2021-09-24 VITALS — Ht 70.0 in

## 2021-09-24 VITALS — BP 137/89 | HR 68 | Temp 97.6°F | Resp 18

## 2021-09-24 DIAGNOSIS — D4622 Refractory anemia with excess of blasts 2: Secondary | ICD-10-CM | POA: Diagnosis not present

## 2021-09-24 DIAGNOSIS — Z5111 Encounter for antineoplastic chemotherapy: Secondary | ICD-10-CM | POA: Diagnosis not present

## 2021-09-24 DIAGNOSIS — D46Z Other myelodysplastic syndromes: Secondary | ICD-10-CM

## 2021-09-24 DIAGNOSIS — D696 Thrombocytopenia, unspecified: Secondary | ICD-10-CM

## 2021-09-24 DIAGNOSIS — D469 Myelodysplastic syndrome, unspecified: Secondary | ICD-10-CM

## 2021-09-24 DIAGNOSIS — Z95828 Presence of other vascular implants and grafts: Secondary | ICD-10-CM

## 2021-09-24 LAB — COMPREHENSIVE METABOLIC PANEL
ALT: 13 U/L (ref 0–44)
AST: 15 U/L (ref 15–41)
Albumin: 3.7 g/dL (ref 3.5–5.0)
Alkaline Phosphatase: 59 U/L (ref 38–126)
Anion gap: 6 (ref 5–15)
BUN: 14 mg/dL (ref 8–23)
CO2: 25 mmol/L (ref 22–32)
Calcium: 9.2 mg/dL (ref 8.9–10.3)
Chloride: 108 mmol/L (ref 98–111)
Creatinine, Ser: 0.9 mg/dL (ref 0.61–1.24)
GFR, Estimated: >60 mL/min (ref >60)
Glucose, Bld: 97 mg/dL (ref 70–99)
Potassium: 3.7 mmol/L (ref 3.5–5.1)
Sodium: 139 mmol/L (ref 135–145)
Total Bilirubin: 1.7 mg/dL — ABNORMAL HIGH (ref 0.3–1.2)
Total Protein: 6.4 g/dL — ABNORMAL LOW (ref 6.5–8.1)

## 2021-09-24 LAB — CBC WITH DIFFERENTIAL/PLATELET
Abs Immature Granulocytes: 0 10*3/uL (ref 0.00–0.07)
Basophils Absolute: 0 10*3/uL (ref 0.0–0.1)
Basophils Relative: 1 %
Eosinophils Absolute: 0 10*3/uL (ref 0.0–0.5)
Eosinophils Relative: 1 %
HCT: 32.9 % — ABNORMAL LOW (ref 39.0–52.0)
Hemoglobin: 11 g/dL — ABNORMAL LOW (ref 13.0–17.0)
Immature Granulocytes: 0 %
Lymphocytes Relative: 75 %
Lymphs Abs: 1.2 10*3/uL (ref 0.7–4.0)
MCH: 32 pg (ref 26.0–34.0)
MCHC: 33.4 g/dL (ref 30.0–36.0)
MCV: 95.6 fL (ref 80.0–100.0)
Monocytes Absolute: 0.1 10*3/uL (ref 0.1–1.0)
Monocytes Relative: 4 %
Neutro Abs: 0.3 10*3/uL — CL (ref 1.7–7.7)
Neutrophils Relative %: 19 %
Platelets: 149 10*3/uL — ABNORMAL LOW (ref 150–400)
RBC: 3.44 MIL/uL — ABNORMAL LOW (ref 4.22–5.81)
RDW: 15.6 % — ABNORMAL HIGH (ref 11.5–15.5)
WBC: 1.7 10*3/uL — ABNORMAL LOW (ref 4.0–10.5)
nRBC: 0 % (ref 0.0–0.2)

## 2021-09-24 LAB — MAGNESIUM: Magnesium: 2.1 mg/dL (ref 1.7–2.4)

## 2021-09-24 MED ORDER — SODIUM CHLORIDE 0.9% FLUSH
10.0000 mL | INTRAVENOUS | Status: DC | PRN
  Administered 2021-09-24: 10 mL

## 2021-09-24 MED ORDER — SODIUM CHLORIDE 0.9 % IV SOLN
10.0000 mg | Freq: Once | INTRAVENOUS | Status: AC
  Administered 2021-09-24: 10 mg via INTRAVENOUS
  Filled 2021-09-24: qty 10

## 2021-09-24 MED ORDER — SODIUM CHLORIDE 0.9 % IV SOLN: Freq: Once | INTRAVENOUS | Status: AC

## 2021-09-24 MED ORDER — PALONOSETRON HCL INJECTION 0.25 MG/5ML
0.2500 mg | Freq: Once | INTRAVENOUS | Status: AC
  Administered 2021-09-24: 0.25 mg via INTRAVENOUS

## 2021-09-24 MED ORDER — SODIUM CHLORIDE 0.9 % IV SOLN
75.0000 mg/m2 | Freq: Once | INTRAVENOUS | Status: AC
  Administered 2021-09-24: 140 mg via INTRAVENOUS
  Filled 2021-09-24: qty 14

## 2021-09-24 MED ORDER — HEPARIN SOD (PORK) LOCK FLUSH 100 UNIT/ML IV SOLN
500.0000 [IU] | Freq: Once | INTRAVENOUS | Status: AC | PRN
  Administered 2021-09-24: 500 [IU]

## 2021-09-24 NOTE — Patient Instructions (Addendum)
Chad Lamb at [facility] Discharge Instructions   You were seen and examined today by Dr. Delton Coombes.  He reviewed the results of your lab work which are normal/stable.  We will proceed with your treatment today. We will increase the Vidaza to full dose.   Return as scheduled.    Thank you for choosing [place] at [facility] to provide your oncology and hematology care.  To afford each patient quality time with our provider, please arrive at least 15 minutes before your scheduled appointment time.   If you have a lab appointment with the [facility] please come in thru the Main Entrance and check in at the main information desk.  You need to re-schedule your appointment should you arrive 10 or more minutes late.  We strive to give you quality time with our providers, and arriving late affects you and other patients whose appointments are after yours.  Also, if you no show three or more times for appointments you may be dismissed from the clinic at the providers discretion.     Again, thank you for choosing [facility].  Our hope is that these requests will decrease the amount of time that you wait before being seen by our physicians.       _____________________________________________________________  Should you have questions after your visit to [facility], please contact our office at [phone] and follow the prompts.  Our office hours are 8:00 a.m. and 4:30 p.m. Monday - Friday.  Please note that voicemails left after 4:00 p.m. may not be returned until the following business day.  We are closed weekends and major holidays.  You do have access to a nurse 24-7, just call the main number to the clinic [phone] and do not press any options, hold on the line and a nurse will answer the phone.    For prescription refill requests, have your pharmacy contact our office and allow 72 hours.    Due to Covid, you  will need to wear a mask upon entering the hospital. If you do not have a mask, a mask will be given to you at the Main Entrance upon arrival. For doctor visits, patients may have 1 support person age 35 or older with them. For treatment visits, patients can not have anyone with them due to social distancing guidelines and our immunocompromised population.

## 2021-09-24 NOTE — Patient Instructions (Signed)
[facility]  Discharge Instructions: Thank you for choosing [facility] to provide your oncology and hematology care.  If you have a lab appointment with the [facility], please come in thru the Main Entrance and check in at the main information desk.  Wear comfortable clothing and clothing appropriate for easy access to any Portacath or PICC line.   We strive to give you quality time with your provider. You may need to reschedule your appointment if you arrive late (15 or more minutes).  Arriving late affects you and other patients whose appointments are after yours.  Also, if you miss three or more appointments without notifying the office, you may be dismissed from the clinic at the provider's discretion.      For prescription refill requests, have your pharmacy contact our office and allow 72 hours for refills to be completed.    Today you received the following chemotherapy and/or immunotherapy agents vidaza.  Azacitidine suspension for injection (subcutaneous use) What is this medication? AZACITIDINE (ay [place]) is a chemotherapy drug. This medicine reduces the growth of cancer cells and can suppress the immune system. It is used for treating myelodysplastic syndrome or some types of leukemia. This medicine may be used for other purposes; ask your health care provider or pharmacist if you have questions. COMMON BRAND NAME(S): Vidaza What should I tell my care team before I take this medication? They need to know if you have any of these conditions: kidney disease liver disease liver tumors an unusual or allergic reaction to azacitidine, mannitol, other medicines, foods, dyes, or preservatives pregnant or trying to get pregnant breast-feeding How should I use this medication? This medicine is for injection under the skin. It is administered in a hospital or clinic by a specially trained health care professional. Talk to your pediatrician regarding  the use of this medicine in children. While this drug may be prescribed for selected conditions, precautions do apply. Overdosage: If you think you have taken too much of this medicine contact a poison control center or emergency room at once. NOTE: This medicine is only for you. Do not share this medicine with others. What if I miss a dose? It is important not to miss your dose. Call your doctor or health care professional if you are unable to keep an appointment. What may interact with this medication? Interactions have not been studied. Give your health care provider a list of all the medicines, herbs, non-prescription drugs, or dietary supplements you use. Also tell them if you smoke, drink alcohol, or use illegal drugs. Some items may interact with your medicine. This list may not describe all possible interactions. Give your health care provider a list of all the medicines, herbs, non-prescription drugs, or dietary supplements you use. Also tell them if you smoke, drink alcohol, or use illegal drugs. Some items may interact with your medicine. What should I watch for while using this medication? Visit your doctor for checks on your progress. This drug may make you feel generally unwell. This is not uncommon, as chemotherapy can affect healthy cells as well as cancer cells. Report any side effects. Continue your course of treatment even though you feel ill unless your doctor tells you to stop. In some cases, you may be given additional medicines to help with side effects. Follow all directions for their use. Call your doctor or health care professional for advice if you get a fever, chills or sore throat, or other  symptoms of a cold or flu. Do not treat yourself. This drug decreases your body's ability to fight infections. Try to avoid being around people who are sick. This medicine may increase your risk to bruise or bleed. Call your doctor or health care professional if you notice any unusual  bleeding. You may need blood work done while you are taking this medicine. Do not become pregnant while taking this medicine and for 6 months after the last dose. Women should inform their doctor if they wish to become pregnant or think they might be pregnant. Men should not father a child while taking this medicine and for 3 months after the last dose. There is a potential for serious side effects to an unborn child. Talk to your health care professional or pharmacist for more information. Do not breast-feed an infant while taking this medicine and for 1 week after the last dose. This medicine may interfere with the ability to have a child. Talk with your doctor or health care professional if you are concerned about your fertility. What side effects may I notice from receiving this medication? Side effects that you should report to your doctor or health care professional as soon as possible: allergic reactions like skin rash, itching or hives, swelling of the face, lips, or tongue low blood counts - this medicine may decrease the number of white blood cells, red blood cells and platelets. You may be at increased risk for infections and bleeding. signs of infection - fever or chills, cough, sore throat, pain passing urine signs of decreased platelets or bleeding - bruising, pinpoint red spots on the skin, black, tarry stools, blood in the urine signs of decreased red blood cells - unusually weak or tired, fainting spells, lightheadedness signs and symptoms of kidney injury like trouble passing urine or change in the amount of urine signs and symptoms of liver injury like dark yellow or brown urine; general ill feeling or flu-like symptoms; light-colored stools; loss of appetite; nausea; right upper belly pain; unusually weak or tired; yellowing of the eyes or skin Side effects that usually do not require medical attention (report to your doctor or health care professional if they continue or are  bothersome): constipation diarrhea nausea, vomiting pain or redness at the injection site unusually weak or tired This list may not describe all possible side effects. Call your doctor for medical advice about side effects. You may report side effects to FDA at 1-800-FDA-1088. Where should I keep my medication? This drug is given in a hospital or clinic and will not be stored at home. NOTE: This sheet is a summary. It may not cover all possible information. If you have questions about this medicine, talk to your doctor, pharmacist, or health care provider.  2023 Elsevier/Gold Standard (2016-04-03 00:00:00)       To help prevent nausea and vomiting after your treatment, we encourage you to take your nausea medication as directed.  BELOW ARE SYMPTOMS THAT SHOULD BE REPORTED IMMEDIATELY: *FEVER GREATER THAN 100.4 F (38 C) OR HIGHER *CHILLS OR SWEATING *NAUSEA AND VOMITING THAT IS NOT CONTROLLED WITH YOUR NAUSEA MEDICATION *UNUSUAL SHORTNESS OF BREATH *UNUSUAL BRUISING OR BLEEDING *URINARY PROBLEMS (pain or burning when urinating, or frequent urination) *BOWEL PROBLEMS (unusual diarrhea, constipation, pain near the anus) TENDERNESS IN MOUTH AND THROAT WITH OR WITHOUT PRESENCE OF ULCERS (sore throat, sores in mouth, or a toothache) UNUSUAL RASH, SWELLING OR PAIN  UNUSUAL VAGINAL DISCHARGE OR ITCHING   Items with * indicate a potential  emergency and should be followed up as soon as possible or go to the Emergency Department if any problems should occur.  Please show the CHEMOTHERAPY ALERT CARD or IMMUNOTHERAPY ALERT CARD at check-in to the Emergency Department and triage nurse.  Should you have questions after your visit or need to cancel or reschedule your appointment, please contact [facility] [phone]  and follow the prompts.  Office hours are 8:00 a.m. to 4:30 p.m. Monday - Friday. Please note that voicemails left after 4:00 p.m. may not be returned until the  following business day.  We are closed weekends and major holidays. You have access to a nurse at all times for urgent questions. Please call the main number to the clinic [phone] and follow the prompts.  For any non-urgent questions, you may also contact your provider using MyChart. We now offer e-Visits for anyone 55 and older to request care online for non-urgent symptoms. For details visit [email or web address].   Also download the MyChart app! Go to the app store, search "MyChart", open the app, select [facility], and log in with your MyChart username and password.  Masks are optional in the cancer centers. If you would like for your care team to wear a mask while they are taking care of you, please let them know. For doctor visits, patients may have with them one support person who is at least 77 years old. At this time, visitors are not allowed in the infusion area.

## 2021-09-24 NOTE — Progress Notes (Signed)
[facility] [place], [place] [address]   CLINIC:  Medical Oncology/Hematology  PCP:  [name], [place] / [place] [address] [phone]   REASON FOR VISIT:  Follow-up for MDS with EB 2  PRIOR THERAPY: none  NGS Results: ASXL 1 mutation  CURRENT THERAPY: Azacitidine IV D1-7 q28d (started 06/18/21)  BRIEF ONCOLOGIC HISTORY:  Oncology History  Myelodysplastic syndrome ([facility])  06/17/2021 Initial Diagnosis   Myelodysplastic syndrome ([place])   07/30/2021 -  Chemotherapy   Patient is on Treatment Plan : MYELODYSPLASIA  Azacitidine IV D1-7 q28d       CANCER STAGING:  Cancer Staging  No matching staging information was found for the patient.  INTERVAL HISTORY:  Chad Lamb, a 76 y.o. male, returns for routine follow-up and consideration for next cycle of chemotherapy. Tyronne was last seen on 08/27/2021.  Due for cycle #3 of Azacitidine today.   Overall, he tells me he has been feeling pretty well. He had constipation for 2 days following treatment which was helped by Colace and prune juice. He denies nausea, infections, and bleeding issues. His appetite is good.   Overall, he feels ready for next cycle of chemo today.    REVIEW OF SYSTEMS:  Review of Systems  Constitutional:  Negative for appetite change and fatigue.  HENT:   Negative for nosebleeds.   Respiratory:  Negative for hemoptysis.   Gastrointestinal:  Positive for constipation. Negative for blood in stool and nausea.  Genitourinary:  Negative for hematuria.   Hematological:  Does not bruise/bleed easily.  Psychiatric/Behavioral:  The patient is nervous/anxious.   All other systems reviewed and are negative.   PAST MEDICAL/SURGICAL HISTORY:  Past Medical History:  Diagnosis Date   Allergy    Anxiety    Arthritis    Asthma    Cataract    Clotting disorder ([facility])    DVT (deep venous thrombosis) (H[facility])    unknown etiology. 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   Pneumonia    Port-A-Cath in place 07/30/2021   Past Surgical History:  Procedure Laterality Date   CATARACT EXTRACTION W/PHACO Right 11/24/2017   Procedure: CATARACT EXTRACTION PHACO AND INTRAOCULAR LENS PLACEMENT RIGHT EYE;  Surgeon: [name], MD;  Location: [facility];  Service: Ophthalmology;  Laterality: Right;  CDE: 8.61   CATARACT EXTRACTION W/PHACO Left 12/29/2017   Procedure: CATARACT EXTRACTION PHACO AND INTRAOCULAR LENS PLACEMENT (IOC);  Surgeon: [name], MD;  Location: [facility];  Service: Ophthalmology;  Laterality: Left;  CDE: 7.21   GANGLION CYST EXCISION     HERNIA REPAIR     bilateral   PARS PLANA VITRECTOMY Left 05/17/2021   Procedure: PARS PLANA VITRECTOMY 25 GAUGE WITH ANTIBIOTIC INJECTION  AND ENDOLASER LEFT EYE;  Surgeon: [name], MD;  Location: [facility];  Service: Ophthalmology;  Laterality: Left;   PORTACATH PLACEMENT Right 07/09/2021   Procedure: INSERTION PORT-A-CATH;  Surgeon: Aviva Signs, MD;  Location: [facility];  Service: General;  Laterality: Right;   TONSILLECTOMY     VASCULAR SURGERY      SOCIAL HISTORY:  Social History   Socioeconomic History   Marital status: Married    Spouse name: [name]   Number of children: 0   Years of education: 13   Highest education  level: High school graduate  Occupational History   Not on file  Tobacco Use   Smoking status: Former    Types: Pipe    Quit date: 03/21/1966    Years since quitting: 55.5   Smokeless tobacco: Never  Vaping Use   Vaping Use: Never used  Substance and Sexual Activity   Alcohol use: Yes    Alcohol/week: 1.0 standard drink of alcohol    Types: 1 Cans of beer per week    Comment: 1 can of beer a week   Drug use: Never   Sexual activity: Not Currently  Other Topics Concern   Not on file  Social History Narrative   **  Merged History Encounter **       Grew up in [place], [place] and [place].  Retired.  Worked in [employer]. 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    Social Determinants of Health   Financial Resource Strain: Low Risk  (01/23/2017)   Overall Financial Resource Strain (CARDIA)    Difficulty of Paying Living Expenses: Not hard at all  Food Insecurity: No Food Insecurity (01/23/2017)   Hunger Vital Sign    Worried About Running Out of Food in the Last Year: Never true    Ran Out of Food in the Last Year: Never true  Transportation Needs: No Transportation Needs (01/23/2017)   PRAPARE - Hydrologist (Medical): No    Lack of Transportation (Non-Medical): No  Physical Activity: Unknown (01/23/2017)   Exercise Vital Sign    Days of Exercise per Week: 3 days    Minutes of Exercise per Session: Not on file  Stress: Stress Concern Present (01/23/2017)   [place]    Feeling of Stress : To some extent  Social Connections: Somewhat Isolated (01/23/2017)   Social Connection and Isolation Panel [NHANES]    Frequency of Communication with Friends and Family: More than three times a week    Frequency of Social Gatherings with Friends and Family: More than three times a week    Attends Religious Services: Never    Marine scientist or Organizations: No    Attends Archivist Meetings: Never    Marital Status: Married  Human resources officer Violence: Not At Risk (01/23/2017)   Humiliation, Afraid, Rape, and Kick questionnaire    Fear of Current or Ex-Partner: No    Emotionally Abused: No    Physically Abused: No    Sexually Abused: No    FAMILY HISTORY:  Family History  Problem Relation Age of Onset   Cancer Mother        pancreatic   Arthritis Father    Early death Father 13       Lung infiltrate   Colon cancer Neg Hx    Colon polyps Neg Hx      CURRENT MEDICATIONS:  Current Outpatient Medications  Medication Sig Dispense Refill   albuterol (PROVENTIL HFA;VENTOLIN HFA) 108 (90 Base) MCG/ACT inhaler Inhale 1-2 puffs into the lungs every 6 (six) hours as needed for wheezing or shortness of breath.     ALPRAZolam (XANAX) 0.5 MG tablet Take 0.5 mg by mouth 2 (two) times daily as needed for anxiety.     amLODipine (NORVASC) 2.5 MG tablet Take 2.5 mg by mouth daily.     azaCITIDine 5 mg/2 mLs in lactated ringers infusion Inject into the vein daily. Days 1-7 every 28  days     fluticasone furoate-vilanterol (BREO ELLIPTA) 200-25 MCG/ACT AEPB Inhale 1 puff into the lungs daily.     Lidocaine-Hydrocortisone Ace 3-0.5 % CREA Apply 1 application topically as directed. (Patient taking differently: Apply 1 application  topically 2 (two) times daily as needed (hemorrhoids).) 30 Tube 11   loratadine (CLARITIN) 10 MG tablet Take 10 mg by mouth daily.     Lutein 40 MG CAPS Take 40 mg by mouth daily.     Multiple Vitamins-Minerals (CENTRUM ADULTS PO) Take 1 tablet by mouth daily.     nystatin cream (MYCOSTATIN) Apply 1 application. topically 2 (two) times daily as needed for dry skin.     ofloxacin (OCUFLOX) 0.3 % ophthalmic solution Place 1 drop into the left eye See admin instructions. 1 drop to left eye 4 x daily for 7 days after monthly procedure.     omeprazole (PRILOSEC OTC) 20 MG tablet Take 5-10 mg by mouth daily.     sildenafil (VIAGRA) 50 MG tablet Take 50 mg by mouth daily as needed for erectile dysfunction.     traMADol (ULTRAM) 50 MG tablet Take 1 tablet (50 mg total) by mouth every 6 (six) hours as needed. 15 tablet 0   lidocaine-prilocaine (EMLA) cream Apply a small amount to port a cath site (do not rub in) and cover with plastic wrap 1 hour prior to infusion appointments (Patient not taking: Reported on 09/24/2021) 30 g 3   prochlorperazine (COMPAZINE) 10 MG tablet Take 1 tablet (10 mg total) by mouth every 6 (six) hours as needed  (Nausea or vomiting). (Patient not taking: Reported on 09/24/2021) 30 tablet 1   No current facility-administered medications for this visit.   Facility-Administered Medications Ordered in Other Visits  Medication Dose Route Frequency Provider Last Rate Last Admin   sodium chloride flush (NS) 0.9 % injection 10 mL  10 mL Intracatheter PRN [name], MD   10 mL at 08/03/21 1250    ALLERGIES:  Allergies  Allergen Reactions   Cephalosporins Itching   Amoxapine And Related Itching and Rash    Has patient had a PCN reaction causing immediate rash, facial/tongue/throat swelling, SOB or lightheadedness with hypotension: No Has patient had a PCN reaction causing severe rash involving mucus membranes or skin necrosis: No Has patient had a PCN reaction that required hospitalization: No Has patient had a PCN reaction occurring within the last 10 years: No If all of the above answers are "NO", then may proceed with Cephalosporin use.     Amoxicillin Rash   Cephalexin Rash   Clindamycin/Lincomycin Itching and Rash   Sulfamethoxazole Itching and Rash   Trimethoprim Rash    PHYSICAL EXAM:  Performance status (ECOG): 1 - Symptomatic but completely ambulatory  There were no vitals filed for this visit. Wt Readings from Last 3 Encounters:  09/24/21 153 lb (69.4 kg)  09/03/21 153 lb 6.4 oz (69.6 kg)  08/29/21 157 lb 9.6 oz (71.5 kg)   Physical Exam  LABORATORY DATA:  I have reviewed the labs as listed.     Latest Ref Rng & Units 09/24/2021   12:03 PM 09/03/2021   11:59 AM 08/27/2021   11:58 AM  CBC  WBC 4.0 - 10.5 K/uL 1.7  2.2  1.5   Hemoglobin 13.0 - 17.0 g/dL 11.0  10.7  10.5   Hematocrit 39.0 - 52.0 % 32.9  31.7  31.7   Platelets 150 - 400 K/uL 149  143  121  Latest Ref Rng & Units 09/03/2021   11:59 AM 08/27/2021   11:58 AM 08/06/2021   11:55 AM  CMP  Glucose 70 - 99 mg/dL 127  138  130   BUN 8 - 23 mg/dL _0 Creatinine 0.61 - 1.24 mg/dL 0.97  0.94   0.96   Sodium 135 - 145 mmol/L 138  137  139   Potassium 3.5 - 5.1 mmol/L 3.6  3.4  3.7   Chloride 98 - 111 mmol/L 104  110  106   CO2 22 - 32 mmol/L _1 Calcium 8.9 - 10.3 mg/dL 9.3  8.9  9.5   Total Protein 6.5 - 8.1 g/dL 6.1  6.1  6.2   Total Bilirubin 0.3 - 1.2 mg/dL 1.4  1.5  1.7   Alkaline Phos 38 - 126 U/L 57  62  57   AST 15 - 41 U/L _2 ALT 0 - 44 U/L _3 DIAGNOSTIC IMAGING:  I have independently reviewed the scans and discussed with the patient. No results found.   ASSESSMENT:  MDS with EB 2: - Seen at the request of Dr. Mannie Stabile for abnormal CBC. - Labs on 12/06/2020 shows white count 3.0 with 41% neutrophils, 41% lymphocytes, 13.8% monocytes.  ANC was 1.2.  Hemoglobin 13.4 and normal.  MCV was slightly high at 96.  Platelet count was 128.  Vitamin U13 and folic acid were normal. - He took antibiotics for teeth implants few times this year, last 1 on 11/16/2020.  He does not know the antibiotic name.  He also reports taking over-the-counter pill for memory for 5 to 6 months, which was stopped around September 2022. - Labs from 07/07/2020 with creatinine 0.92.  White count 3.3 (37% neutrophils, 45% lymphocytes, 14% monocytes, 3% eosinophils), ANC 1.2.  Platelet count 149. - His prior CBC from 12/08/2017 in our system was completely normal. - Reports slight worsening of the night sweats in the last 3 months.  At least once per week and has to change clothes.  No fevers or weight loss. - BMBX on 06/04/2021: Hypercellular marrow with dyspoietic changes involving the granulocytic cell line and megakaryocytes.  Myeloblasts 8% on aspirate smears and 10% by flow.  Cytogenetics 1, XY (20).  MDS FISH panel normal. 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 - IPSS-M score of 0.12, moderate high risk.  Leukemia free survival is 2.3 years.  Overall survival 2.8 years. - Cycle 1 of azacitidine on 07/30/2021   Social/family history: - He lives at home with his wife.  He  swims about 40 minutes daily. 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  Non-smoker. - Paternal aunt had breast cancer and mother had pancreatic cancer.  3.  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 - He underwent thrombolytic [facility] at [facility]. - He will [facility] up [facility] and has been on Xarelto since then.   PLAN:  MDS with EB 2: - He has tolerated second cycle of azacitidine at 65 MGs/m2 for 7 days reasonably well. - He had constipation for which she is taking Colace and drinking prune juice. - Does not report any infections or bleeding issues. - Reviewed blood work today which showed white count 1.7 with ANC of  0.3 which is stable.  However his hemoglobin has improved to 11 and platelet count improved to 149.  LFTs are normal. - Proceed with cycle 3 azacitidine today.  I will increase dose to 75 mg/m2 x7 days. - We will check natal CBC with differential in 2 weeks. - RTC 4 weeks for follow-up with repeat labs.   Unprovoked left subclavian DVT: - We have discontinued Xarelto due to hemorrhoidal bleeding.   Orders placed this encounter:  No orders of the defined types were placed in this encounter.    [name], MD [facility] [phone]   I, Thana Ates, am acting as a scribe for Dr. [name].  I, [name] MD, have reviewed the above documentation for accuracy and completeness, and I agree with the above.

## 2021-09-24 NOTE — Progress Notes (Signed)
ANC 0.3 today and ok for treatment verbal order Dr . Delton Coombes.    Patient tolerated chemotherapy with no complaints voiced.  Side effects with management reviewed with understanding verbalized.  Port site clean and dry with no bruising or swelling noted at site.  Good blood return noted before and after administration of chemotherapy.  Band aid applied.  Patient left in satisfactory condition with VSS and no s/s of distress noted.

## 2021-09-24 NOTE — Progress Notes (Signed)
CRITICAL VALUE ALERT Critical value received:  ANC 0.3 Date of notification:  09-24-2021 Time of notification: 13:13 pm. Critical value read back:  Yes.   Nurse who received alert:  B. Blase Beckner RN MD notified time and response:  [place].

## 2021-09-24 NOTE — Progress Notes (Signed)
Dose escalation to 75 mg/m2 confirmed by Dr Delton Coombes for this cycle.  Chad Lamb, PharmD

## 2021-09-25 ENCOUNTER — Inpatient Hospital Stay (HOSPITAL_COMMUNITY): Payer: Medicare HMO

## 2021-09-25 VITALS — BP 122/76 | HR 63 | Temp 98.7°F | Resp 18 | Ht 70.0 in | Wt 153.0 lb

## 2021-09-25 DIAGNOSIS — D469 Myelodysplastic syndrome, unspecified: Secondary | ICD-10-CM

## 2021-09-25 DIAGNOSIS — Z5111 Encounter for antineoplastic chemotherapy: Secondary | ICD-10-CM | POA: Diagnosis not present

## 2021-09-25 DIAGNOSIS — Z95828 Presence of other vascular implants and grafts: Secondary | ICD-10-CM

## 2021-09-25 MED ORDER — HEPARIN SOD (PORK) LOCK FLUSH 100 UNIT/ML IV SOLN
500.0000 [IU] | Freq: Once | INTRAVENOUS | Status: AC | PRN
  Administered 2021-09-25: 500 [IU]

## 2021-09-25 MED ORDER — SODIUM CHLORIDE 0.9% FLUSH
10.0000 mL | INTRAVENOUS | Status: DC | PRN
  Administered 2021-09-25: 10 mL

## 2021-09-25 MED ORDER — SODIUM CHLORIDE 0.9 % IV SOLN
75.0000 mg/m2 | Freq: Once | INTRAVENOUS | Status: AC
  Administered 2021-09-25: 140 mg via INTRAVENOUS
  Filled 2021-09-25: qty 14

## 2021-09-25 MED ORDER — SODIUM CHLORIDE 0.9 % IV SOLN: Freq: Once | INTRAVENOUS | Status: AC

## 2021-09-25 MED ORDER — SODIUM CHLORIDE 0.9 % IV SOLN
10.0000 mg | Freq: Once | INTRAVENOUS | Status: AC
  Administered 2021-09-25: 10 mg via INTRAVENOUS
  Filled 2021-09-25: qty 10

## 2021-09-25 MED FILL — Dexamethasone Sodium Phosphate Inj 100 MG/10ML: INTRAMUSCULAR | Qty: 1 | Status: AC

## 2021-09-25 NOTE — Patient Instructions (Signed)
[facility]  Discharge Instructions: Thank you for choosing [facility] to provide your oncology and hematology care.  If you have a lab appointment with the [facility], please come in thru the Main Entrance and check in at the main information desk.  Wear comfortable clothing and clothing appropriate for easy access to any Portacath or PICC line.   We strive to give you quality time with your provider. You may need to reschedule your appointment if you arrive late (15 or more minutes).  Arriving late affects you and other patients whose appointments are after yours.  Also, if you miss three or more appointments without notifying the office, you may be dismissed from the clinic at the provider's discretion.      For prescription refill requests, have your pharmacy contact our office and allow 72 hours for refills to be completed.    Today you received the following chemotherapy and/or immunotherapy agents vidaza      To help prevent nausea and vomiting after your treatment, we encourage you to take your nausea medication as directed.  BELOW ARE SYMPTOMS THAT SHOULD BE REPORTED IMMEDIATELY: *FEVER GREATER THAN 100.4 F (38 C) OR HIGHER *CHILLS OR SWEATING *NAUSEA AND VOMITING THAT IS NOT CONTROLLED WITH YOUR NAUSEA MEDICATION *UNUSUAL SHORTNESS OF BREATH *UNUSUAL BRUISING OR BLEEDING *URINARY PROBLEMS (pain or burning when urinating, or frequent urination) *BOWEL PROBLEMS (unusual diarrhea, constipation, pain near the anus) TENDERNESS IN MOUTH AND THROAT WITH OR WITHOUT PRESENCE OF ULCERS (sore throat, sores in mouth, or a toothache) UNUSUAL RASH, SWELLING OR PAIN  UNUSUAL VAGINAL DISCHARGE OR ITCHING   Items with * indicate a potential emergency and should be followed up as soon as possible or go to the Emergency Department if any problems should occur.  Please show the CHEMOTHERAPY ALERT CARD or IMMUNOTHERAPY ALERT CARD at check-in to the Emergency  Department and triage nurse.  Should you have questions after your visit or need to cancel or reschedule your appointment, please contact [facility] [phone]  and follow the prompts.  Office hours are 8:00 a.m. to 4:30 p.m. Monday - Friday. Please note that voicemails left after 4:00 p.m. may not be returned until the following business day.  We are closed weekends and major holidays. You have access to a nurse at all times for urgent questions. Please call the main number to the clinic [phone] and follow the prompts.  For any non-urgent questions, you may also contact your provider using MyChart. We now offer e-Visits for anyone 77 and older to request care online for non-urgent symptoms. For details visit [email or web address].   Also download the MyChart app! Go to the app store, search "MyChart", open the app, select [facility], and log in with your MyChart username and password.  Masks are optional in the cancer centers. If you would like for your care team to wear a mask while they are taking care of you, please let them know. For doctor visits, patients may have with them one support person who is at least 77 years old. At this time, visitors are not allowed in the infusion area.

## 2021-09-25 NOTE — Progress Notes (Signed)
Patient presents today for Vidaza infusion per providers order.  Vital signs and labs within parameters for treatment.  Patient has no new complaints at this time.  Vidaza given today per MD orders.  Stable during infusion without adverse affects.  Vital signs stable.  No complaints at this time.  Discharge from clinic ambulatory in stable condition.  Alert and oriented X 3.  Follow up with [facility] as scheduled.

## 2021-09-26 ENCOUNTER — Inpatient Hospital Stay (HOSPITAL_COMMUNITY): Payer: Medicare HMO

## 2021-09-26 VITALS — BP 130/81 | HR 56 | Temp 97.4°F | Resp 18 | Wt 154.8 lb

## 2021-09-26 DIAGNOSIS — Z5111 Encounter for antineoplastic chemotherapy: Secondary | ICD-10-CM | POA: Diagnosis not present

## 2021-09-26 DIAGNOSIS — Z95828 Presence of other vascular implants and grafts: Secondary | ICD-10-CM

## 2021-09-26 DIAGNOSIS — D469 Myelodysplastic syndrome, unspecified: Secondary | ICD-10-CM

## 2021-09-26 MED ORDER — SODIUM CHLORIDE 0.9 % IV SOLN
10.0000 mg | Freq: Once | INTRAVENOUS | Status: AC
  Administered 2021-09-26: 10 mg via INTRAVENOUS
  Filled 2021-09-26: qty 10

## 2021-09-26 MED ORDER — SODIUM CHLORIDE 0.9% FLUSH
10.0000 mL | INTRAVENOUS | Status: DC | PRN
  Administered 2021-09-26: 10 mL

## 2021-09-26 MED ORDER — PALONOSETRON HCL INJECTION 0.25 MG/5ML
0.2500 mg | Freq: Once | INTRAVENOUS | Status: AC
  Administered 2021-09-26: 0.25 mg via INTRAVENOUS
  Filled 2021-09-26: qty 5

## 2021-09-26 MED ORDER — SODIUM CHLORIDE 0.9 % IV SOLN: Freq: Once | INTRAVENOUS | Status: AC

## 2021-09-26 MED ORDER — HEPARIN SOD (PORK) LOCK FLUSH 100 UNIT/ML IV SOLN
500.0000 [IU] | Freq: Once | INTRAVENOUS | Status: AC | PRN
  Administered 2021-09-26: 500 [IU]

## 2021-09-26 MED ORDER — SODIUM CHLORIDE 0.9 % IV SOLN
75.0000 mg/m2 | Freq: Once | INTRAVENOUS | Status: AC
  Administered 2021-09-26: 140 mg via INTRAVENOUS
  Filled 2021-09-26: qty 14

## 2021-09-26 MED FILL — Azacitidine For Inj 100 MG: INTRAMUSCULAR | Qty: 14 | Status: AC

## 2021-09-26 NOTE — Progress Notes (Signed)
Pt presents today for Vidaza per provider's order. Vital signs stable and pt voiced no new complaints at this time.  Vidaza  given today per MD orders. Tolerated infusion without adverse affects. Vital signs stable. No complaints at this time. Discharged from clinic ambulatory in stable condition. Alert and oriented x 3. F/U with [facility] as scheduled.

## 2021-09-27 ENCOUNTER — Inpatient Hospital Stay (HOSPITAL_COMMUNITY): Payer: Medicare HMO

## 2021-09-27 VITALS — BP 126/79 | HR 80 | Temp 96.7°F | Resp 16

## 2021-09-27 DIAGNOSIS — Z5111 Encounter for antineoplastic chemotherapy: Secondary | ICD-10-CM | POA: Diagnosis not present

## 2021-09-27 DIAGNOSIS — D469 Myelodysplastic syndrome, unspecified: Secondary | ICD-10-CM

## 2021-09-27 DIAGNOSIS — Z95828 Presence of other vascular implants and grafts: Secondary | ICD-10-CM

## 2021-09-27 MED ORDER — SODIUM CHLORIDE 0.9% FLUSH
10.0000 mL | INTRAVENOUS | Status: DC | PRN
  Administered 2021-09-27: 10 mL

## 2021-09-27 MED ORDER — SODIUM CHLORIDE 0.9 % IV SOLN: Freq: Once | INTRAVENOUS | Status: AC

## 2021-09-27 MED ORDER — HEPARIN SOD (PORK) LOCK FLUSH 100 UNIT/ML IV SOLN
500.0000 [IU] | Freq: Once | INTRAVENOUS | Status: AC | PRN
  Administered 2021-09-27: 500 [IU]

## 2021-09-27 MED ORDER — SODIUM CHLORIDE 0.9 % IV SOLN
75.0000 mg/m2 | Freq: Once | INTRAVENOUS | Status: AC
  Administered 2021-09-27: 140 mg via INTRAVENOUS
  Filled 2021-09-27: qty 14

## 2021-09-27 MED ORDER — SODIUM CHLORIDE 0.9 % IV SOLN
10.0000 mg | Freq: Once | INTRAVENOUS | Status: AC
  Administered 2021-09-27: 10 mg via INTRAVENOUS
  Filled 2021-09-27: qty 10

## 2021-09-27 NOTE — Progress Notes (Signed)
Patient tolerated chemotherapy with no complaints voiced. Side effects with management reviewed understanding verbalized. Port site clean and dry with no bruising or swelling noted at site. Good blood return noted before and after administration of chemotherapy. Band aid applied. Patient left in satisfactory condition with VSS and no s/s of distress noted.

## 2021-09-27 NOTE — Patient Instructions (Signed)
[facility]  Discharge Instructions: Thank you for choosing [facility] to provide your oncology and hematology care.  If you have a lab appointment with the [facility], please come in thru the Main Entrance and check in at the main information desk.  Wear comfortable clothing and clothing appropriate for easy access to any Portacath or PICC line.   We strive to give you quality time with your provider. You may need to reschedule your appointment if you arrive late (15 or more minutes).  Arriving late affects you and other patients whose appointments are after yours.  Also, if you miss three or more appointments without notifying the office, you may be dismissed from the clinic at the provider's discretion.      For prescription refill requests, have your pharmacy contact our office and allow 72 hours for refills to be completed.    Today you received the following chemotherapy and/or immunotherapy agents Vidaza, return as scheduled.   To help prevent nausea and vomiting after your treatment, we encourage you to take your nausea medication as directed.  BELOW ARE SYMPTOMS THAT SHOULD BE REPORTED IMMEDIATELY: *FEVER GREATER THAN 100.4 F (38 C) OR HIGHER *CHILLS OR SWEATING *NAUSEA AND VOMITING THAT IS NOT CONTROLLED WITH YOUR NAUSEA MEDICATION *UNUSUAL SHORTNESS OF BREATH *UNUSUAL BRUISING OR BLEEDING *URINARY PROBLEMS (pain or burning when urinating, or frequent urination) *BOWEL PROBLEMS (unusual diarrhea, constipation, pain near the anus) TENDERNESS IN MOUTH AND THROAT WITH OR WITHOUT PRESENCE OF ULCERS (sore throat, sores in mouth, or a toothache) UNUSUAL RASH, SWELLING OR PAIN  UNUSUAL VAGINAL DISCHARGE OR ITCHING   Items with * indicate a potential emergency and should be followed up as soon as possible or go to the Emergency Department if any problems should occur.  Please show the CHEMOTHERAPY ALERT CARD or IMMUNOTHERAPY ALERT CARD at check-in to the  Emergency Department and triage nurse.  Should you have questions after your visit or need to cancel or reschedule your appointment, please contact [facility] [phone]  and follow the prompts.  Office hours are 8:00 a.m. to 4:30 p.m. Monday - Friday. Please note that voicemails left after 4:00 p.m. may not be returned until the following business day.  We are closed weekends and major holidays. You have access to a nurse at all times for urgent questions. Please call the main number to the clinic [phone] and follow the prompts.  For any non-urgent questions, you may also contact your provider using MyChart. We now offer e-Visits for anyone 77 and older to request care online for non-urgent symptoms. For details visit [email or web address].   Also download the MyChart app! Go to the app store, search "MyChart", open the app, select [facility], and log in with your MyChart username and password.  Masks are optional in the cancer centers. If you would like for your care team to wear a mask while they are taking care of you, please let them know. For doctor visits, patients may have with them one support person who is at least 77 years old. At this time, visitors are not allowed in the infusion area.

## 2021-09-28 ENCOUNTER — Inpatient Hospital Stay (HOSPITAL_COMMUNITY): Payer: Medicare HMO

## 2021-09-28 VITALS — BP 135/78 | HR 63 | Temp 97.8°F | Resp 17

## 2021-09-28 DIAGNOSIS — D469 Myelodysplastic syndrome, unspecified: Secondary | ICD-10-CM

## 2021-09-28 DIAGNOSIS — Z5111 Encounter for antineoplastic chemotherapy: Secondary | ICD-10-CM | POA: Diagnosis not present

## 2021-09-28 DIAGNOSIS — Z95828 Presence of other vascular implants and grafts: Secondary | ICD-10-CM

## 2021-09-28 MED ORDER — PALONOSETRON HCL INJECTION 0.25 MG/5ML
0.2500 mg | Freq: Once | INTRAVENOUS | Status: AC
  Administered 2021-09-28: 0.25 mg via INTRAVENOUS
  Filled 2021-09-28: qty 5

## 2021-09-28 MED ORDER — HEPARIN SOD (PORK) LOCK FLUSH 100 UNIT/ML IV SOLN
500.0000 [IU] | Freq: Once | INTRAVENOUS | Status: AC | PRN
  Administered 2021-09-28: 500 [IU]

## 2021-09-28 MED ORDER — SODIUM CHLORIDE 0.9 % IV SOLN
75.0000 mg/m2 | Freq: Once | INTRAVENOUS | Status: AC
  Administered 2021-09-28: 140 mg via INTRAVENOUS
  Filled 2021-09-28: qty 14

## 2021-09-28 MED ORDER — SODIUM CHLORIDE 0.9 % IV SOLN
10.0000 mg | Freq: Once | INTRAVENOUS | Status: AC
  Administered 2021-09-28: 10 mg via INTRAVENOUS
  Filled 2021-09-28: qty 10

## 2021-09-28 MED ORDER — SODIUM CHLORIDE 0.9 % IV SOLN: Freq: Once | INTRAVENOUS | Status: AC

## 2021-09-28 MED ORDER — SODIUM CHLORIDE 0.9% FLUSH
10.0000 mL | INTRAVENOUS | Status: DC | PRN
  Administered 2021-09-28: 10 mL

## 2021-09-28 NOTE — Progress Notes (Signed)
Patient presents today for D5 of Vidaza.  Patient is in satisfactory condition with no complaints voiced.  Vital signs are stable.  We will proceed with treatment per MD orders.  Patient tolerated treatment well with no complaints voiced.  Patient left ambulatory in stable condition.  Vital signs stable at discharge.  Follow up as scheduled.

## 2021-09-28 NOTE — Progress Notes (Unsigned)
Message from Dr. Trenda Moots. Anderson RN no labs needed for Day 6 Vidaza. Pharmacy aware. Nursing communication in treatment plan to reflect changes by C. Ronnald Ramp [facility]

## 2021-09-28 NOTE — Patient Instructions (Signed)
[facility]  Discharge Instructions: Thank you for choosing [facility] to provide your oncology and hematology care.  If you have a lab appointment with the [facility], please come in thru the Main Entrance and check in at the main information desk.  Wear comfortable clothing and clothing appropriate for easy access to any Portacath or PICC line.   We strive to give you quality time with your provider. You may need to reschedule your appointment if you arrive late (15 or more minutes).  Arriving late affects you and other patients whose appointments are after yours.  Also, if you miss three or more appointments without notifying the office, you may be dismissed from the clinic at the provider's discretion.      For prescription refill requests, have your pharmacy contact our office and allow 72 hours for refills to be completed.    Today you received the following chemotherapy and/or immunotherapy agents Vidaza.  Azacitidine suspension for injection (subcutaneous use) What is this medication? AZACITIDINE (ay [place]) is a chemotherapy drug. This medicine reduces the growth of cancer cells and can suppress the immune system. It is used for treating myelodysplastic syndrome or some types of leukemia. This medicine may be used for other purposes; ask your health care provider or pharmacist if you have questions. COMMON BRAND NAME(S): Vidaza What should I tell my care team before I take this medication? They need to know if you have any of these conditions: kidney disease liver disease liver tumors an unusual or allergic reaction to azacitidine, mannitol, other medicines, foods, dyes, or preservatives pregnant or trying to get pregnant breast-feeding How should I use this medication? This medicine is for injection under the skin. It is administered in a hospital or clinic by a specially trained health care professional. Talk to your pediatrician regarding  the use of this medicine in children. While this drug may be prescribed for selected conditions, precautions do apply. Overdosage: If you think you have taken too much of this medicine contact a poison control center or emergency room at once. NOTE: This medicine is only for you. Do not share this medicine with others. What if I miss a dose? It is important not to miss your dose. Call your doctor or health care professional if you are unable to keep an appointment. What may interact with this medication? Interactions have not been studied. Give your health care provider a list of all the medicines, herbs, non-prescription drugs, or dietary supplements you use. Also tell them if you smoke, drink alcohol, or use illegal drugs. Some items may interact with your medicine. This list may not describe all possible interactions. Give your health care provider a list of all the medicines, herbs, non-prescription drugs, or dietary supplements you use. Also tell them if you smoke, drink alcohol, or use illegal drugs. Some items may interact with your medicine. What should I watch for while using this medication? Visit your doctor for checks on your progress. This drug may make you feel generally unwell. This is not uncommon, as chemotherapy can affect healthy cells as well as cancer cells. Report any side effects. Continue your course of treatment even though you feel ill unless your doctor tells you to stop. In some cases, you may be given additional medicines to help with side effects. Follow all directions for their use. Call your doctor or health care professional for advice if you get a fever, chills or sore throat, or other  symptoms of a cold or flu. Do not treat yourself. This drug decreases your body's ability to fight infections. Try to avoid being around people who are sick. This medicine may increase your risk to bruise or bleed. Call your doctor or health care professional if you notice any unusual  bleeding. You may need blood work done while you are taking this medicine. Do not become pregnant while taking this medicine and for 6 months after the last dose. Women should inform their doctor if they wish to become pregnant or think they might be pregnant. Men should not father a child while taking this medicine and for 3 months after the last dose. There is a potential for serious side effects to an unborn child. Talk to your health care professional or pharmacist for more information. Do not breast-feed an infant while taking this medicine and for 1 week after the last dose. This medicine may interfere with the ability to have a child. Talk with your doctor or health care professional if you are concerned about your fertility. What side effects may I notice from receiving this medication? Side effects that you should report to your doctor or health care professional as soon as possible: allergic reactions like skin rash, itching or hives, swelling of the face, lips, or tongue low blood counts - this medicine may decrease the number of white blood cells, red blood cells and platelets. You may be at increased risk for infections and bleeding. signs of infection - fever or chills, cough, sore throat, pain passing urine signs of decreased platelets or bleeding - bruising, pinpoint red spots on the skin, black, tarry stools, blood in the urine signs of decreased red blood cells - unusually weak or tired, fainting spells, lightheadedness signs and symptoms of kidney injury like trouble passing urine or change in the amount of urine signs and symptoms of liver injury like dark yellow or Jolee Critcher urine; general ill feeling or flu-like symptoms; light-colored stools; loss of appetite; nausea; right upper belly pain; unusually weak or tired; yellowing of the eyes or skin Side effects that usually do not require medical attention (report to your doctor or health care professional if they continue or are  bothersome): constipation diarrhea nausea, vomiting pain or redness at the injection site unusually weak or tired This list may not describe all possible side effects. Call your doctor for medical advice about side effects. You may report side effects to FDA at 1-800-FDA-1088. Where should I keep my medication? This drug is given in a hospital or clinic and will not be stored at home. NOTE: This sheet is a summary. It may not cover all possible information. If you have questions about this medicine, talk to your doctor, pharmacist, or health care provider.  2023 Elsevier/Gold Standard (2016-04-03 00:00:00)        To help prevent nausea and vomiting after your treatment, we encourage you to take your nausea medication as directed.  BELOW ARE SYMPTOMS THAT SHOULD BE REPORTED IMMEDIATELY: *FEVER GREATER THAN 100.4 F (38 C) OR HIGHER *CHILLS OR SWEATING *NAUSEA AND VOMITING THAT IS NOT CONTROLLED WITH YOUR NAUSEA MEDICATION *UNUSUAL SHORTNESS OF BREATH *UNUSUAL BRUISING OR BLEEDING *URINARY PROBLEMS (pain or burning when urinating, or frequent urination) *BOWEL PROBLEMS (unusual diarrhea, constipation, pain near the anus) TENDERNESS IN MOUTH AND THROAT WITH OR WITHOUT PRESENCE OF ULCERS (sore throat, sores in mouth, or a toothache) UNUSUAL RASH, SWELLING OR PAIN  UNUSUAL VAGINAL DISCHARGE OR ITCHING   Items with * indicate a  potential emergency and should be followed up as soon as possible or go to the Emergency Department if any problems should occur.  Please show the CHEMOTHERAPY ALERT CARD or IMMUNOTHERAPY ALERT CARD at check-in to the Emergency Department and triage nurse.  Should you have questions after your visit or need to cancel or reschedule your appointment, please contact [facility] [phone]  and follow the prompts.  Office hours are 8:00 a.m. to 4:30 p.m. Monday - Friday. Please note that voicemails left after 4:00 p.m. may not be returned until the  following business day.  We are closed weekends and major holidays. You have access to a nurse at all times for urgent questions. Please call the main number to the clinic [phone] and follow the prompts.  For any non-urgent questions, you may also contact your provider using MyChart. We now offer e-Visits for anyone 33 and older to request care online for non-urgent symptoms. For details visit [email or web address].   Also download the MyChart app! Go to the app store, search "MyChart", open the app, select [facility], and log in with your MyChart username and password.  Masks are optional in the cancer centers. If you would like for your care team to wear a mask while they are taking care of you, please let them know. For doctor visits, patients may have with them one support person who is at least 77 years old. At this time, visitors are not allowed in the infusion area.

## 2021-10-01 ENCOUNTER — Inpatient Hospital Stay (HOSPITAL_COMMUNITY): Payer: Medicare HMO

## 2021-10-01 VITALS — BP 108/69 | HR 63 | Temp 97.4°F | Resp 18

## 2021-10-01 DIAGNOSIS — D469 Myelodysplastic syndrome, unspecified: Secondary | ICD-10-CM

## 2021-10-01 DIAGNOSIS — Z5111 Encounter for antineoplastic chemotherapy: Secondary | ICD-10-CM | POA: Diagnosis not present

## 2021-10-01 MED ORDER — SODIUM CHLORIDE 0.9 % IV SOLN
10.0000 mg | Freq: Once | INTRAVENOUS | Status: AC
  Administered 2021-10-01: 10 mg via INTRAVENOUS
  Filled 2021-10-01: qty 1

## 2021-10-01 MED ORDER — SODIUM CHLORIDE 0.9 % IV SOLN
75.0000 mg/m2 | Freq: Once | INTRAVENOUS | Status: AC
  Administered 2021-10-01: 140 mg via INTRAVENOUS
  Filled 2021-10-01: qty 14

## 2021-10-01 MED ORDER — SODIUM CHLORIDE 0.9 % IV SOLN: Freq: Once | INTRAVENOUS | Status: AC

## 2021-10-01 MED ORDER — PALONOSETRON HCL INJECTION 0.25 MG/5ML
0.2500 mg | Freq: Once | INTRAVENOUS | Status: AC
  Administered 2021-10-01: 0.25 mg via INTRAVENOUS
  Filled 2021-10-01: qty 5

## 2021-10-01 MED ORDER — SODIUM CHLORIDE 0.9% FLUSH
10.0000 mL | INTRAVENOUS | Status: DC | PRN
  Administered 2021-10-01: 10 mL

## 2021-10-01 MED ORDER — HEPARIN SOD (PORK) LOCK FLUSH 100 UNIT/ML IV SOLN
500.0000 [IU] | Freq: Once | INTRAVENOUS | Status: AC | PRN
  Administered 2021-10-01: 500 [IU]

## 2021-10-01 NOTE — Progress Notes (Signed)
Patient presents today for Vidaza infusion per providers order.  Vital signs within parameters for treatment.  Patient has no new complaints at this time.    Vidaza given today per MD orders.  Stable during infusion without adverse affects.  Vital signs stable.  No complaints at this time.  Discharge from clinic ambulatory in stable condition.  Alert and oriented X 3.  Follow up with [facility] as scheduled.

## 2021-10-01 NOTE — Patient Instructions (Signed)
[facility]  Discharge Instructions: Thank you for choosing [facility] to provide your oncology and hematology care.  If you have a lab appointment with the [facility], please come in thru the Main Entrance and check in at the main information desk.  Wear comfortable clothing and clothing appropriate for easy access to any Portacath or PICC line.   We strive to give you quality time with your provider. You may need to reschedule your appointment if you arrive late (15 or more minutes).  Arriving late affects you and other patients whose appointments are after yours.  Also, if you miss three or more appointments without notifying the office, you may be dismissed from the clinic at the provider's discretion.      For prescription refill requests, have your pharmacy contact our office and allow 72 hours for refills to be completed.    Today you received the following chemotherapy and/or immunotherapy agents Vidaza      To help prevent nausea and vomiting after your treatment, we encourage you to take your nausea medication as directed.  BELOW ARE SYMPTOMS THAT SHOULD BE REPORTED IMMEDIATELY: *FEVER GREATER THAN 100.4 F (38 C) OR HIGHER *CHILLS OR SWEATING *NAUSEA AND VOMITING THAT IS NOT CONTROLLED WITH YOUR NAUSEA MEDICATION *UNUSUAL SHORTNESS OF BREATH *UNUSUAL BRUISING OR BLEEDING *URINARY PROBLEMS (pain or burning when urinating, or frequent urination) *BOWEL PROBLEMS (unusual diarrhea, constipation, pain near the anus) TENDERNESS IN MOUTH AND THROAT WITH OR WITHOUT PRESENCE OF ULCERS (sore throat, sores in mouth, or a toothache) UNUSUAL RASH, SWELLING OR PAIN  UNUSUAL VAGINAL DISCHARGE OR ITCHING   Items with * indicate a potential emergency and should be followed up as soon as possible or go to the Emergency Department if any problems should occur.  Please show the CHEMOTHERAPY ALERT CARD or IMMUNOTHERAPY ALERT CARD at check-in to the Emergency  Department and triage nurse.  Should you have questions after your visit or need to cancel or reschedule your appointment, please contact [facility] [phone]  and follow the prompts.  Office hours are 8:00 a.m. to 4:30 p.m. Monday - Friday. Please note that voicemails left after 4:00 p.m. may not be returned until the following business day.  We are closed weekends and major holidays. You have access to a nurse at all times for urgent questions. Please call the main number to the clinic [phone] and follow the prompts.  For any non-urgent questions, you may also contact your provider using MyChart. We now offer e-Visits for anyone 77 and older to request care online for non-urgent symptoms. For details visit [email or web address].   Also download the MyChart app! Go to the app store, search "MyChart", open the app, select [facility], and log in with your MyChart username and password.  Masks are optional in the cancer centers. If you would like for your care team to wear a mask while they are taking care of you, please let them know. For doctor visits, patients may have with them one support person who is at least 77 years old. At this time, visitors are not allowed in the infusion area.

## 2021-10-02 ENCOUNTER — Inpatient Hospital Stay (HOSPITAL_COMMUNITY): Payer: Medicare HMO

## 2021-10-02 ENCOUNTER — Encounter (HOSPITAL_COMMUNITY): Payer: Self-pay

## 2021-10-02 VITALS — BP 132/82 | HR 63 | Temp 97.3°F | Resp 18

## 2021-10-02 DIAGNOSIS — Z95828 Presence of other vascular implants and grafts: Secondary | ICD-10-CM

## 2021-10-02 DIAGNOSIS — D469 Myelodysplastic syndrome, unspecified: Secondary | ICD-10-CM

## 2021-10-02 DIAGNOSIS — Z5111 Encounter for antineoplastic chemotherapy: Secondary | ICD-10-CM | POA: Diagnosis not present

## 2021-10-02 MED ORDER — SODIUM CHLORIDE 0.9 % IV SOLN
10.0000 mg | Freq: Once | INTRAVENOUS | Status: AC
  Administered 2021-10-02: 10 mg via INTRAVENOUS
  Filled 2021-10-02: qty 10

## 2021-10-02 MED ORDER — SODIUM CHLORIDE 0.9 % IV SOLN
75.0000 mg/m2 | Freq: Once | INTRAVENOUS | Status: AC
  Administered 2021-10-02: 140 mg via INTRAVENOUS
  Filled 2021-10-02: qty 14

## 2021-10-02 MED ORDER — HEPARIN SOD (PORK) LOCK FLUSH 100 UNIT/ML IV SOLN
500.0000 [IU] | Freq: Once | INTRAVENOUS | Status: AC | PRN
  Administered 2021-10-02: 500 [IU]

## 2021-10-02 MED ORDER — SODIUM CHLORIDE 0.9% FLUSH
10.0000 mL | INTRAVENOUS | Status: DC | PRN
  Administered 2021-10-02: 10 mL

## 2021-10-02 MED ORDER — SODIUM CHLORIDE 0.9 % IV SOLN: Freq: Once | INTRAVENOUS | Status: AC

## 2021-10-02 NOTE — Progress Notes (Signed)

## 2021-10-02 NOTE — Patient Instructions (Signed)
[place]  Discharge Instructions: Thank you for choosing [facility] to provide your oncology and hematology care.  If you have a lab appointment with the [facility], please come in thru the Main Entrance and check in at the main information desk.  Wear comfortable clothing and clothing appropriate for easy access to any Portacath or PICC line.   We strive to give you quality time with your provider. You may need to reschedule your appointment if you arrive late (15 or more minutes).  Arriving late affects you and other patients whose appointments are after yours.  Also, if you miss three or more appointments without notifying the office, you may be dismissed from the clinic at the provider's discretion.      For prescription refill requests, have your pharmacy contact our office and allow 72 hours for refills to be completed.    Today you received the following chemotherapy and/or immunotherapy agents vidaza.  Azacitidine suspension for injection (subcutaneous use) What is this medication? AZACITIDINE (ay [place]) is a chemotherapy drug. This medicine reduces the growth of cancer cells and can suppress the immune system. It is used for treating myelodysplastic syndrome or some types of leukemia. This medicine may be used for other purposes; ask your health care provider or pharmacist if you have questions. COMMON BRAND NAME(S): Vidaza What should I tell my care team before I take this medication? They need to know if you have any of these conditions: kidney disease liver disease liver tumors an unusual or allergic reaction to azacitidine, mannitol, other medicines, foods, dyes, or preservatives pregnant or trying to get pregnant breast-feeding How should I use this medication? This medicine is for injection under the skin. It is administered in a hospital or clinic by a specially trained health care professional. Talk to your pediatrician regarding  the use of this medicine in children. While this drug may be prescribed for selected conditions, precautions do apply. Overdosage: If you think you have taken too much of this medicine contact a poison control center or emergency room at once. NOTE: This medicine is only for you. Do not share this medicine with others. What if I miss a dose? It is important not to miss your dose. Call your doctor or health care professional if you are unable to keep an appointment. What may interact with this medication? Interactions have not been studied. Give your health care provider a list of all the medicines, herbs, non-prescription drugs, or dietary supplements you use. Also tell them if you smoke, drink alcohol, or use illegal drugs. Some items may interact with your medicine. This list may not describe all possible interactions. Give your health care provider a list of all the medicines, herbs, non-prescription drugs, or dietary supplements you use. Also tell them if you smoke, drink alcohol, or use illegal drugs. Some items may interact with your medicine. What should I watch for while using this medication? Visit your doctor for checks on your progress. This drug may make you feel generally unwell. This is not uncommon, as chemotherapy can affect healthy cells as well as cancer cells. Report any side effects. Continue your course of treatment even though you feel ill unless your doctor tells you to stop. In some cases, you may be given additional medicines to help with side effects. Follow all directions for their use. Call your doctor or health care professional for advice if you get a fever, chills or sore throat, or other  symptoms of a cold or flu. Do not treat yourself. This drug decreases your body's ability to fight infections. Try to avoid being around people who are sick. This medicine may increase your risk to bruise or bleed. Call your doctor or health care professional if you notice any unusual  bleeding. You may need blood work done while you are taking this medicine. Do not become pregnant while taking this medicine and for 6 months after the last dose. Women should inform their doctor if they wish to become pregnant or think they might be pregnant. Men should not father a child while taking this medicine and for 3 months after the last dose. There is a potential for serious side effects to an unborn child. Talk to your health care professional or pharmacist for more information. Do not breast-feed an infant while taking this medicine and for 1 week after the last dose. This medicine may interfere with the ability to have a child. Talk with your doctor or health care professional if you are concerned about your fertility. What side effects may I notice from receiving this medication? Side effects that you should report to your doctor or health care professional as soon as possible: allergic reactions like skin rash, itching or hives, swelling of the face, lips, or tongue low blood counts - this medicine may decrease the number of white blood cells, red blood cells and platelets. You may be at increased risk for infections and bleeding. signs of infection - fever or chills, cough, sore throat, pain passing urine signs of decreased platelets or bleeding - bruising, pinpoint red spots on the skin, black, tarry stools, blood in the urine signs of decreased red blood cells - unusually weak or tired, fainting spells, lightheadedness signs and symptoms of kidney injury like trouble passing urine or change in the amount of urine signs and symptoms of liver injury like dark yellow or brown urine; general ill feeling or flu-like symptoms; light-colored stools; loss of appetite; nausea; right upper belly pain; unusually weak or tired; yellowing of the eyes or skin Side effects that usually do not require medical attention (report to your doctor or health care professional if they continue or are  bothersome): constipation diarrhea nausea, vomiting pain or redness at the injection site unusually weak or tired This list may not describe all possible side effects. Call your doctor for medical advice about side effects. You may report side effects to FDA at 1-800-FDA-1088. Where should I keep my medication? This drug is given in a hospital or clinic and will not be stored at home. NOTE: This sheet is a summary. It may not cover all possible information. If you have questions about this medicine, talk to your doctor, pharmacist, or health care provider.  2023 Elsevier/Gold Standard (2016-04-03 00:00:00)       To help prevent nausea and vomiting after your treatment, we encourage you to take your nausea medication as directed.  BELOW ARE SYMPTOMS THAT SHOULD BE REPORTED IMMEDIATELY: *FEVER GREATER THAN 100.4 F (38 C) OR HIGHER *CHILLS OR SWEATING *NAUSEA AND VOMITING THAT IS NOT CONTROLLED WITH YOUR NAUSEA MEDICATION *UNUSUAL SHORTNESS OF BREATH *UNUSUAL BRUISING OR BLEEDING *URINARY PROBLEMS (pain or burning when urinating, or frequent urination) *BOWEL PROBLEMS (unusual diarrhea, constipation, pain near the anus) TENDERNESS IN MOUTH AND THROAT WITH OR WITHOUT PRESENCE OF ULCERS (sore throat, sores in mouth, or a toothache) UNUSUAL RASH, SWELLING OR PAIN  UNUSUAL VAGINAL DISCHARGE OR ITCHING   Items with * indicate a potential  emergency and should be followed up as soon as possible or go to the Emergency Department if any problems should occur.  Please show the CHEMOTHERAPY ALERT CARD or IMMUNOTHERAPY ALERT CARD at check-in to the Emergency Department and triage nurse.  Should you have questions after your visit or need to cancel or reschedule your appointment, please contact [facility] [phone]  and follow the prompts.  Office hours are 8:00 a.m. to 4:30 p.m. Monday - Friday. Please note that voicemails left after 4:00 p.m. may not be returned until the  following business day.  We are closed weekends and major holidays. You have access to a nurse at all times for urgent questions. Please call the main number to the clinic [phone] and follow the prompts.  For any non-urgent questions, you may also contact your provider using MyChart. We now offer e-Visits for anyone 46 and older to request care online for non-urgent symptoms. For details visit [email or web address].   Also download the MyChart app! Go to the app store, search "MyChart", open the app, select [facility], and log in with your MyChart username and password.  Masks are optional in the cancer centers. If you would like for your care team to wear a mask while they are taking care of you, please let them know. For doctor visits, patients may have with them one support person who is at least 77 years old. At this time, visitors are not allowed in the infusion area.

## 2021-10-08 ENCOUNTER — Other Ambulatory Visit (HOSPITAL_COMMUNITY): Payer: Medicare HMO

## 2021-10-08 ENCOUNTER — Other Ambulatory Visit: Payer: Self-pay

## 2021-10-08 ENCOUNTER — Inpatient Hospital Stay (HOSPITAL_COMMUNITY): Payer: Medicare HMO

## 2021-10-08 DIAGNOSIS — D696 Thrombocytopenia, unspecified: Secondary | ICD-10-CM

## 2021-10-08 DIAGNOSIS — D46Z Other myelodysplastic syndromes: Secondary | ICD-10-CM

## 2021-10-08 DIAGNOSIS — Z5111 Encounter for antineoplastic chemotherapy: Secondary | ICD-10-CM | POA: Diagnosis not present

## 2021-10-08 LAB — CBC WITH DIFFERENTIAL/PLATELET
Abs Immature Granulocytes: 0.01 10*3/uL (ref 0.00–0.07)
Basophils Absolute: 0 10*3/uL (ref 0.0–0.1)
Basophils Relative: 0 %
Eosinophils Absolute: 0.1 10*3/uL (ref 0.0–0.5)
Eosinophils Relative: 6 %
HCT: 31.3 % — ABNORMAL LOW (ref 39.0–52.0)
Hemoglobin: 10.7 g/dL — ABNORMAL LOW (ref 13.0–17.0)
Immature Granulocytes: 0 %
Lymphocytes Relative: 55 %
Lymphs Abs: 1.3 10*3/uL (ref 0.7–4.0)
MCH: 32.8 pg (ref 26.0–34.0)
MCHC: 34.2 g/dL (ref 30.0–36.0)
MCV: 96 fL (ref 80.0–100.0)
Monocytes Absolute: 0.1 10*3/uL (ref 0.1–1.0)
Monocytes Relative: 5 %
Neutro Abs: 0.8 10*3/uL — ABNORMAL LOW (ref 1.7–7.7)
Neutrophils Relative %: 34 %
Platelets: 105 10*3/uL — ABNORMAL LOW (ref 150–400)
RBC: 3.26 MIL/uL — ABNORMAL LOW (ref 4.22–5.81)
RDW: 15.8 % — ABNORMAL HIGH (ref 11.5–15.5)
WBC: 2.4 10*3/uL — ABNORMAL LOW (ref 4.0–10.5)
nRBC: 0 % (ref 0.0–0.2)

## 2021-10-08 LAB — MAGNESIUM: Magnesium: 2 mg/dL (ref 1.7–2.4)

## 2021-10-08 LAB — COMPREHENSIVE METABOLIC PANEL
ALT: 14 U/L (ref 0–44)
AST: 15 U/L (ref 15–41)
Albumin: 3.6 g/dL (ref 3.5–5.0)
Alkaline Phosphatase: 61 U/L (ref 38–126)
Anion gap: 5 (ref 5–15)
BUN: 12 mg/dL (ref 8–23)
CO2: 26 mmol/L (ref 22–32)
Calcium: 9.1 mg/dL (ref 8.9–10.3)
Chloride: 107 mmol/L (ref 98–111)
Creatinine, Ser: 0.88 mg/dL (ref 0.61–1.24)
GFR, Estimated: >60 mL/min (ref >60)
Glucose, Bld: 107 mg/dL — ABNORMAL HIGH (ref 70–99)
Potassium: 3.7 mmol/L (ref 3.5–5.1)
Sodium: 138 mmol/L (ref 135–145)
Total Bilirubin: 1.5 mg/dL — ABNORMAL HIGH (ref 0.3–1.2)
Total Protein: 5.8 g/dL — ABNORMAL LOW (ref 6.5–8.1)

## 2021-10-08 LAB — LACTATE DEHYDROGENASE: LDH: 143 U/L (ref 98–192)

## 2021-10-08 MED ORDER — HEPARIN SOD (PORK) LOCK FLUSH 100 UNIT/ML IV SOLN
500.0000 [IU] | Freq: Once | INTRAVENOUS | Status: AC
  Administered 2021-10-08: 500 [IU] via INTRAVENOUS

## 2021-10-08 MED ORDER — SODIUM CHLORIDE 0.9% FLUSH
10.0000 mL | INTRAVENOUS | Status: DC | PRN
  Administered 2021-10-08: 10 mL via INTRAVENOUS

## 2021-10-16 ENCOUNTER — Other Ambulatory Visit: Payer: Self-pay

## 2021-10-16 ENCOUNTER — Inpatient Hospital Stay: Payer: Medicare HMO | Attending: Hematology

## 2021-10-16 DIAGNOSIS — D696 Thrombocytopenia, unspecified: Secondary | ICD-10-CM

## 2021-10-16 DIAGNOSIS — D46Z Other myelodysplastic syndromes: Secondary | ICD-10-CM

## 2021-10-16 DIAGNOSIS — Z5111 Encounter for antineoplastic chemotherapy: Secondary | ICD-10-CM | POA: Insufficient documentation

## 2021-10-16 DIAGNOSIS — D4622 Refractory anemia with excess of blasts 2: Secondary | ICD-10-CM | POA: Insufficient documentation

## 2021-10-16 LAB — CBC WITH DIFFERENTIAL/PLATELET
Abs Immature Granulocytes: 0 10*3/uL (ref 0.00–0.07)
Basophils Absolute: 0 10*3/uL (ref 0.0–0.1)
Basophils Relative: 1 %
Eosinophils Absolute: 0.1 10*3/uL (ref 0.0–0.5)
Eosinophils Relative: 5 %
HCT: 32.9 % — ABNORMAL LOW (ref 39.0–52.0)
Hemoglobin: 11.1 g/dL — ABNORMAL LOW (ref 13.0–17.0)
Immature Granulocytes: 0 %
Lymphocytes Relative: 64 %
Lymphs Abs: 0.9 10*3/uL (ref 0.7–4.0)
MCH: 32.4 pg (ref 26.0–34.0)
MCHC: 33.7 g/dL (ref 30.0–36.0)
MCV: 95.9 fL (ref 80.0–100.0)
Monocytes Absolute: 0.1 10*3/uL (ref 0.1–1.0)
Monocytes Relative: 8 %
Neutro Abs: 0.3 10*3/uL — CL (ref 1.7–7.7)
Neutrophils Relative %: 22 %
Platelets: 126 10*3/uL — ABNORMAL LOW (ref 150–400)
RBC: 3.43 MIL/uL — ABNORMAL LOW (ref 4.22–5.81)
RDW: 15.8 % — ABNORMAL HIGH (ref 11.5–15.5)
WBC: 1.6 10*3/uL — ABNORMAL LOW (ref 4.0–10.5)
nRBC: 0 % (ref 0.0–0.2)

## 2021-10-16 LAB — MAGNESIUM: Magnesium: 2.1 mg/dL (ref 1.7–2.4)

## 2021-10-16 LAB — COMPREHENSIVE METABOLIC PANEL
ALT: 16 U/L (ref 0–44)
AST: 17 U/L (ref 15–41)
Albumin: 3.7 g/dL (ref 3.5–5.0)
Alkaline Phosphatase: 62 U/L (ref 38–126)
Anion gap: 5 (ref 5–15)
BUN: 16 mg/dL (ref 8–23)
CO2: 26 mmol/L (ref 22–32)
Calcium: 9.3 mg/dL (ref 8.9–10.3)
Chloride: 109 mmol/L (ref 98–111)
Creatinine, Ser: 0.87 mg/dL (ref 0.61–1.24)
GFR, Estimated: >60 mL/min (ref >60)
Glucose, Bld: 103 mg/dL — ABNORMAL HIGH (ref 70–99)
Potassium: 3.8 mmol/L (ref 3.5–5.1)
Sodium: 140 mmol/L (ref 135–145)
Total Bilirubin: 1.5 mg/dL — ABNORMAL HIGH (ref 0.3–1.2)
Total Protein: 6.2 g/dL — ABNORMAL LOW (ref 6.5–8.1)

## 2021-10-16 LAB — LACTATE DEHYDROGENASE: LDH: 144 U/L (ref 98–192)

## 2021-10-16 MED ORDER — HEPARIN SOD (PORK) LOCK FLUSH 100 UNIT/ML IV SOLN
500.0000 [IU] | Freq: Once | INTRAVENOUS | Status: AC
  Administered 2021-10-16: 500 [IU] via INTRAVENOUS

## 2021-10-16 MED ORDER — SODIUM CHLORIDE 0.9% FLUSH
10.0000 mL | Freq: Once | INTRAVENOUS | Status: AC
  Administered 2021-10-16: 10 mL via INTRAVENOUS

## 2021-10-16 NOTE — Progress Notes (Signed)
Message sent to Dr. Delton Coombes-  Patient has a sore upper left tooth that has been bothering him for the last week. He states that you didn't want him to have any procedures on his teeth other than cleanings. He is going to see the dentist tomorrow for a cleaning and they will probably be wanting to send him to have it fixed. He is wanting to know if he can have a procedure done. He thinks that it is a broken tooth but is not sure what they will do.  Dr. Delton Coombes- His white count is low.  Platelet count is adequate to do any cleaning.  If there is any sign of infection, then they need to give him antibiotics. Okay for procedure as long as if any infection they give him antibiotics.  Patient made aware and is agreeable. He will let them know that as long as any signs of infection he will need an antibiotic but can have procedure if needed.

## 2021-10-16 NOTE — Progress Notes (Signed)
Patients port flushed without difficulty.  Good blood return noted with no bruising or swelling noted at site.  Band aid applied.  VSS with discharge and left ambulatory with no s/s of distress noted.

## 2021-10-22 ENCOUNTER — Inpatient Hospital Stay: Payer: Medicare HMO

## 2021-10-22 ENCOUNTER — Inpatient Hospital Stay (HOSPITAL_BASED_OUTPATIENT_CLINIC_OR_DEPARTMENT_OTHER): Payer: Medicare HMO | Admitting: Hematology

## 2021-10-22 VITALS — BP 128/76 | HR 60 | Temp 96.6°F | Resp 18

## 2021-10-22 DIAGNOSIS — D469 Myelodysplastic syndrome, unspecified: Secondary | ICD-10-CM

## 2021-10-22 DIAGNOSIS — D46Z Other myelodysplastic syndromes: Secondary | ICD-10-CM

## 2021-10-22 DIAGNOSIS — Z95828 Presence of other vascular implants and grafts: Secondary | ICD-10-CM

## 2021-10-22 DIAGNOSIS — D696 Thrombocytopenia, unspecified: Secondary | ICD-10-CM

## 2021-10-22 DIAGNOSIS — Z5111 Encounter for antineoplastic chemotherapy: Secondary | ICD-10-CM | POA: Diagnosis not present

## 2021-10-22 LAB — COMPREHENSIVE METABOLIC PANEL
ALT: 14 U/L (ref 0–44)
AST: 19 U/L (ref 15–41)
Albumin: 3.9 g/dL (ref 3.5–5.0)
Alkaline Phosphatase: 62 U/L (ref 38–126)
Anion gap: 3 — ABNORMAL LOW (ref 5–15)
BUN: 12 mg/dL (ref 8–23)
CO2: 26 mmol/L (ref 22–32)
Calcium: 9.1 mg/dL (ref 8.9–10.3)
Chloride: 108 mmol/L (ref 98–111)
Creatinine, Ser: 0.87 mg/dL (ref 0.61–1.24)
GFR, Estimated: >60 mL/min (ref >60)
Glucose, Bld: 105 mg/dL — ABNORMAL HIGH (ref 70–99)
Potassium: 3.5 mmol/L (ref 3.5–5.1)
Sodium: 137 mmol/L (ref 135–145)
Total Bilirubin: 1.3 mg/dL — ABNORMAL HIGH (ref 0.3–1.2)
Total Protein: 6.3 g/dL — ABNORMAL LOW (ref 6.5–8.1)

## 2021-10-22 LAB — CBC WITH DIFFERENTIAL/PLATELET
Abs Immature Granulocytes: 0 10*3/uL (ref 0.00–0.07)
Basophils Absolute: 0 10*3/uL (ref 0.0–0.1)
Basophils Relative: 1 %
Eosinophils Absolute: 0.1 10*3/uL (ref 0.0–0.5)
Eosinophils Relative: 3 %
HCT: 32.6 % — ABNORMAL LOW (ref 39.0–52.0)
Hemoglobin: 10.9 g/dL — ABNORMAL LOW (ref 13.0–17.0)
Immature Granulocytes: 0 %
Lymphocytes Relative: 69 %
Lymphs Abs: 1.3 10*3/uL (ref 0.7–4.0)
MCH: 32.1 pg (ref 26.0–34.0)
MCHC: 33.4 g/dL (ref 30.0–36.0)
MCV: 95.9 fL (ref 80.0–100.0)
Monocytes Absolute: 0.1 10*3/uL (ref 0.1–1.0)
Monocytes Relative: 4 %
Neutro Abs: 0.4 10*3/uL — CL (ref 1.7–7.7)
Neutrophils Relative %: 23 %
Platelets: 153 10*3/uL (ref 150–400)
RBC: 3.4 MIL/uL — ABNORMAL LOW (ref 4.22–5.81)
RDW: 15.2 % (ref 11.5–15.5)
WBC: 1.9 10*3/uL — ABNORMAL LOW (ref 4.0–10.5)
nRBC: 0 % (ref 0.0–0.2)

## 2021-10-22 LAB — MAGNESIUM: Magnesium: 2.1 mg/dL (ref 1.7–2.4)

## 2021-10-22 LAB — LACTATE DEHYDROGENASE: LDH: 143 U/L (ref 98–192)

## 2021-10-22 MED ORDER — SODIUM CHLORIDE 0.9 % IV SOLN
10.0000 mg | Freq: Once | INTRAVENOUS | Status: AC
  Administered 2021-10-22: 10 mg via INTRAVENOUS
  Filled 2021-10-22: qty 10

## 2021-10-22 MED ORDER — SODIUM CHLORIDE 0.9 % IV SOLN
75.0000 mg/m2 | Freq: Once | INTRAVENOUS | Status: AC
  Administered 2021-10-22: 140 mg via INTRAVENOUS
  Filled 2021-10-22: qty 14

## 2021-10-22 MED ORDER — SODIUM CHLORIDE 0.9% FLUSH
10.0000 mL | INTRAVENOUS | Status: DC | PRN
  Administered 2021-10-22: 10 mL

## 2021-10-22 MED ORDER — SODIUM CHLORIDE 0.9 % IV SOLN: Freq: Once | INTRAVENOUS | Status: AC

## 2021-10-22 MED ORDER — PALONOSETRON HCL INJECTION 0.25 MG/5ML
0.2500 mg | Freq: Once | INTRAVENOUS | Status: AC
  Administered 2021-10-22: 0.25 mg via INTRAVENOUS
  Filled 2021-10-22: qty 5

## 2021-10-22 MED ORDER — HEPARIN SOD (PORK) LOCK FLUSH 100 UNIT/ML IV SOLN
500.0000 [IU] | Freq: Once | INTRAVENOUS | Status: AC | PRN
  Administered 2021-10-22: 500 [IU]

## 2021-10-22 NOTE — Patient Instructions (Addendum)
[place] at [facility] Discharge Instructions   You were seen and examined today by Dr. Delton Coombes.  He reviewed the results of your lab work which are normal/stable.   Continue Colace as needed for constipation. You may also use Miralax to help with constipation in addition.  We will proceed with your treatment today.   Return as scheduled.    Thank you for choosing [facility] at [facility] to provide your oncology and hematology care.  To afford each patient quality time with our provider, please arrive at least 15 minutes before your scheduled appointment time.   If you have a lab appointment with the [facility] please come in thru the Main Entrance and check in at the main information desk.  You need to re-schedule your appointment should you arrive 10 or more minutes late.  We strive to give you quality time with our providers, and arriving late affects you and other patients whose appointments are after yours.  Also, if you no show three or more times for appointments you may be dismissed from the clinic at the providers discretion.     Again, thank you for choosing [facility].  Our hope is that these requests will decrease the amount of time that you wait before being seen by our physicians.       _____________________________________________________________  Should you have questions after your visit to [facility], please contact our office at [phone] and follow the prompts.  Our office hours are 8:00 a.m. and 4:30 p.m. Monday - Friday.  Please note that voicemails left after 4:00 p.m. may not be returned until the following business day.  We are closed weekends and major holidays.  You do have access to a nurse 24-7, just call the main number to the clinic [phone] and do not press any options, hold on the line and a nurse will answer the phone.    For prescription refill requests, have your  pharmacy contact our office and allow 72 hours.    Due to Covid, you will need to wear a mask upon entering the hospital. If you do not have a mask, a mask will be given to you at the Main Entrance upon arrival. For doctor visits, patients may have 1 support person age 82 or older with them. For treatment visits, patients can not have anyone with them due to social distancing guidelines and our immunocompromised population.

## 2021-10-22 NOTE — Patient Instructions (Signed)
[place]  Discharge Instructions: Thank you for choosing [facility] to provide your oncology and hematology care.  If you have a lab appointment with the [facility], please come in thru the Main Entrance and check in at the main information desk.  Wear comfortable clothing and clothing appropriate for easy access to any Portacath or PICC line.   We strive to give you quality time with your provider. You may need to reschedule your appointment if you arrive late (15 or more minutes).  Arriving late affects you and other patients whose appointments are after yours.  Also, if you miss three or more appointments without notifying the office, you may be dismissed from the clinic at the provider's discretion.      For prescription refill requests, have your pharmacy contact our office and allow 72 hours for refills to be completed.    Today you received the following chemotherapy and/or immunotherapy agents Vidaza, return as scheduled.   To help prevent nausea and vomiting after your treatment, we encourage you to take your nausea medication as directed.  BELOW ARE SYMPTOMS THAT SHOULD BE REPORTED IMMEDIATELY: *FEVER GREATER THAN 100.4 F (38 C) OR HIGHER *CHILLS OR SWEATING *NAUSEA AND VOMITING THAT IS NOT CONTROLLED WITH YOUR NAUSEA MEDICATION *UNUSUAL SHORTNESS OF BREATH *UNUSUAL BRUISING OR BLEEDING *URINARY PROBLEMS (pain or burning when urinating, or frequent urination) *BOWEL PROBLEMS (unusual diarrhea, constipation, pain near the anus) TENDERNESS IN MOUTH AND THROAT WITH OR WITHOUT PRESENCE OF ULCERS (sore throat, sores in mouth, or a toothache) UNUSUAL RASH, SWELLING OR PAIN  UNUSUAL VAGINAL DISCHARGE OR ITCHING   Items with * indicate a potential emergency and should be followed up as soon as possible or go to the Emergency Department if any problems should occur.  Please show the CHEMOTHERAPY ALERT CARD or IMMUNOTHERAPY ALERT CARD at  check-in to the Emergency Department and triage nurse.  Should you have questions after your visit or need to cancel or reschedule your appointment, please contact [facility] [phone]  and follow the prompts.  Office hours are 8:00 a.m. to 4:30 p.m. Monday - Friday. Please note that voicemails left after 4:00 p.m. may not be returned until the following business day.  We are closed weekends and major holidays. You have access to a nurse at all times for urgent questions. Please call the main number to the clinic [phone] and follow the prompts.  For any non-urgent questions, you may also contact your provider using MyChart. We now offer e-Visits for anyone 37 and older to request care online for non-urgent symptoms. For details visit [email or web address].   Also download the MyChart app! Go to the app store, search "MyChart", open the app, select [facility], and log in with your MyChart username and password.  Masks are optional in the cancer centers. If you would like for your care team to wear a mask while they are taking care of you, please let them know. For doctor visits, patients may have with them one support person who is at least 77 years old. At this time, visitors are not allowed in the infusion area.

## 2021-10-22 NOTE — Progress Notes (Signed)
[facility] [place], [place] [address]   CLINIC:  Medical Oncology/Hematology  PCP:  [name], [place] / [place] [address] [phone]   REASON FOR VISIT:  Follow-up for MDS with EB 2  PRIOR THERAPY: none  NGS Results: ASXL 1 mutation  CURRENT THERAPY: Azacitidine IV D1-7 q28d (started 06/18/21)  BRIEF ONCOLOGIC HISTORY:  Oncology History  Myelodysplastic syndrome ([facility])  06/17/2021 Initial Diagnosis   Myelodysplastic syndrome ([facility])   07/30/2021 -  Chemotherapy   Patient is on Treatment Plan : MYELODYSPLASIA  Azacitidine IV D1-7 q28d       CANCER STAGING:  Cancer Staging  No matching staging information was found for the patient.  INTERVAL HISTORY:  Chad Lamb, a 76 y.o. male, seen for follow-up of MDS.  After last treatment, he did not experience any major side effects.  He had constipation which was fairly well controlled with Colace with occasional prune juice.  Did not have any infections.  He has some trouble swallowing from dental issues.   REVIEW OF SYSTEMS:  Review of Systems  Constitutional:  Negative for appetite change and fatigue.  HENT:   Positive for trouble swallowing. Negative for nosebleeds.   Respiratory:  Negative for hemoptysis.   Gastrointestinal:  Positive for constipation. Negative for blood in stool and nausea.  Genitourinary:  Negative for hematuria.   Hematological:  Does not bruise/bleed easily.  Psychiatric/Behavioral:  The patient is nervous/anxious.   All other systems reviewed and are negative.   PAST MEDICAL/SURGICAL HISTORY:  Past Medical History:  Diagnosis Date   Allergy    Anxiety    Arthritis    Asthma    Cataract    Clotting disorder ([facility])    DVT (deep venous thrombosis) (HCC)    unknown etiology. 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[facility]) 06/17/2021   Pneumonia    Port-A-Cath in place 07/30/2021   Past Surgical History:  Procedure Laterality Date   CATARACT EXTRACTION W/PHACO Right 11/24/2017   Procedure: CATARACT EXTRACTION PHACO AND INTRAOCULAR LENS PLACEMENT RIGHT EYE;  Surgeon: [name], MD;  Location: [facility];  Service: Ophthalmology;  Laterality: Right;  CDE: 8.61   CATARACT EXTRACTION W/PHACO Left 12/29/2017   Procedure: CATARACT EXTRACTION PHACO AND INTRAOCULAR LENS PLACEMENT (IOC);  Surgeon: [name], MD;  Location: [facility];  Service: Ophthalmology;  Laterality: Left;  CDE: 7.21   GANGLION CYST EXCISION     HERNIA REPAIR     bilateral   PARS PLANA VITRECTOMY Left 05/17/2021   Procedure: PARS PLANA VITRECTOMY 25 GAUGE WITH ANTIBIOTIC INJECTION  AND ENDOLASER LEFT EYE;  Surgeon: [name], MD;  Location: [facility];  Service: Ophthalmology;  Laterality: Left;   PORTACATH PLACEMENT Right 07/09/2021   Procedure: INSERTION PORT-A-CATH;  Surgeon: Aviva Signs, MD;  Location: [facility];  Service: General;  Laterality: Right;   TONSILLECTOMY     VASCULAR SURGERY      SOCIAL HISTORY:  Social History   Socioeconomic History   Marital status: Married    Spouse name: [name]   Number of children: 0   Years of education: 13   Highest education level: High school graduate  Occupational History   Not on file  Tobacco Use   Smoking status: Former    Types: Pipe  Quit date: 03/21/1966    Years since quitting: 55.6   Smokeless tobacco: Never  Vaping Use   Vaping Use: Never used  Substance and Sexual Activity   Alcohol use: Yes    Alcohol/week: 1.0 standard drink of alcohol    Types: 1 Cans of beer per week    Comment: 1 can of beer a week   Drug use: Never   Sexual activity: Not Currently  Other Topics Concern   Not on file  Social History Narrative   ** Merged History Encounter **       Grew up in [place], [place] and [place].  Retired.  Worked in [place]. 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    Social Determinants of Health   Financial Resource Strain: Low Risk  (01/23/2017)   Overall Financial Resource Strain (CARDIA)    Difficulty of Paying Living Expenses: Not hard at all  Food Insecurity: No Food Insecurity (01/23/2017)   Hunger Vital Sign    Worried About Running Out of Food in the Last Year: Never true    Ran Out of Food in the Last Year: Never true  Transportation Needs: No Transportation Needs (01/23/2017)   PRAPARE - Hydrologist (Medical): No    Lack of Transportation (Non-Medical): No  Physical Activity: Unknown (01/23/2017)   Exercise Vital Sign    Days of Exercise per Week: 3 days    Minutes of Exercise per Session: Not on file  Stress: Stress Concern Present (01/23/2017)   [place]    Feeling of Stress : To some extent  Social Connections: Somewhat Isolated (01/23/2017)   Social Connection and Isolation Panel [NHANES]    Frequency of Communication with Friends and Family: More than three times a week    Frequency of Social Gatherings with Friends and Family: More than three times a week    Attends Religious Services: Never    Marine scientist or Organizations: No    Attends Archivist Meetings: Never    Marital Status: Married  Human resources officer Violence: Not At Risk (01/23/2017)   Humiliation, Afraid, Rape, and Kick questionnaire    Fear of Current or Ex-Partner: No    Emotionally Abused: No    Physically Abused: No    Sexually Abused: No    FAMILY HISTORY:  Family History  Problem Relation Age of Onset   Cancer Mother        pancreatic   Arthritis Father    Early death Father 14       Lung infiltrate   Colon cancer Neg Hx    Colon polyps Neg Hx     CURRENT MEDICATIONS:  Current Outpatient Medications  Medication Sig Dispense Refill   albuterol (PROVENTIL HFA;VENTOLIN HFA) 108 (90 Base)  MCG/ACT inhaler Inhale 1-2 puffs into the lungs every 6 (six) hours as needed for wheezing or shortness of breath.     ALPRAZolam (XANAX) 0.5 MG tablet Take 0.5 mg by mouth 2 (two) times daily as needed for anxiety.     amLODipine (NORVASC) 2.5 MG tablet Take 2.5 mg by mouth daily.     azaCITIDine 5 mg/2 mLs in lactated ringers infusion Inject into the vein daily. Days 1-7 every 28 days     fluticasone furoate-vilanterol (BREO ELLIPTA) 200-25 MCG/ACT AEPB Inhale 1 puff into the lungs daily.     Lidocaine-Hydrocortisone Ace 3-0.5 % CREA  Apply 1 application topically as directed. (Patient taking differently: Apply 1 application  topically 2 (two) times daily as needed (hemorrhoids).) 30 Tube 11   loratadine (CLARITIN) 10 MG tablet Take 10 mg by mouth daily.     Lutein 40 MG CAPS Take 40 mg by mouth daily.     Multiple Vitamins-Minerals (CENTRUM ADULTS PO) Take 1 tablet by mouth daily.     nystatin cream (MYCOSTATIN) Apply 1 application. topically 2 (two) times daily as needed for dry skin.     ofloxacin (OCUFLOX) 0.3 % ophthalmic solution Place 1 drop into the left eye See admin instructions. 1 drop to left eye 4 x daily for 7 days after monthly procedure.     omeprazole (PRILOSEC OTC) 20 MG tablet Take 5-10 mg by mouth daily.     sildenafil (VIAGRA) 50 MG tablet Take 50 mg by mouth daily as needed for erectile dysfunction.     traMADol (ULTRAM) 50 MG tablet Take 1 tablet (50 mg total) by mouth every 6 (six) hours as needed. 15 tablet 0   lidocaine-prilocaine (EMLA) cream Apply a small amount to port a cath site (do not rub in) and cover with plastic wrap 1 hour prior to infusion appointments (Patient not taking: Reported on 10/22/2021) 30 g 3   prochlorperazine (COMPAZINE) 10 MG tablet Take 1 tablet (10 mg total) by mouth every 6 (six) hours as needed (Nausea or vomiting). (Patient not taking: Reported on 10/22/2021) 30 tablet 1   No current facility-administered medications for this visit.    Facility-Administered Medications Ordered in Other Visits  Medication Dose Route Frequency Provider Last Rate Last Admin   sodium chloride flush (NS) 0.9 % injection 10 mL  10 mL Intracatheter PRN [name], MD   10 mL at 08/03/21 1250   sodium chloride flush (NS) 0.9 % injection 10 mL  10 mL Intracatheter PRN [name], MD   10 mL at 10/22/21 1520    ALLERGIES:  Allergies  Allergen Reactions   Cephalosporins Itching   Amoxapine And Related Itching and Rash    Has patient had a PCN reaction causing immediate rash, facial/tongue/throat swelling, SOB or lightheadedness with hypotension: No Has patient had a PCN reaction causing severe rash involving mucus membranes or skin necrosis: No Has patient had a PCN reaction that required hospitalization: No Has patient had a PCN reaction occurring within the last 10 years: No If all of the above answers are "NO", then may proceed with Cephalosporin use.     Amoxicillin Rash   Cephalexin Rash   Clindamycin/Lincomycin Itching and Rash   Sulfamethoxazole Itching and Rash   Trimethoprim Rash    PHYSICAL EXAM:  Performance status (ECOG): 1 - Symptomatic but completely ambulatory  There were no vitals filed for this visit. Wt Readings from Last 3 Encounters:  10/22/21 156 lb 12.8 oz (71.1 kg)  09/26/21 154 lb 12.8 oz (70.2 kg)  09/25/21 153 lb (69.4 kg)   Physical Exam Vitals reviewed.  Constitutional:      Appearance: Normal appearance.  Cardiovascular:     Heart sounds: Normal heart sounds.  Pulmonary:     Effort: Pulmonary effort is normal.     Breath sounds: Normal breath sounds.  Neurological:     Mental Status: He is alert and oriented to person, place, and time.  Psychiatric:        Mood and Affect: Mood normal.        Behavior: Behavior normal.     LABORATORY DATA:  I have reviewed the labs as listed.     Latest Ref Rng & Units 10/22/2021   12:27 PM 10/16/2021    2:57 PM 10/08/2021    8:49 AM   CBC  WBC 4.0 - 10.5 K/uL 1.9  1.6  2.4   Hemoglobin 13.0 - 17.0 g/dL 10.9  11.1  10.7   Hematocrit 39.0 - 52.0 % 32.6  32.9  31.3   Platelets 150 - 400 K/uL 153  126  105       Latest Ref Rng & Units 10/22/2021   12:27 PM 10/16/2021    2:57 PM 10/08/2021    8:49 AM  CMP  Glucose 70 - 99 mg/dL 105  103  107   BUN 8 - 23 mg/dL _0 Creatinine 0.61 - 1.24 mg/dL 0.87  0.87  0.88   Sodium 135 - 145 mmol/L 137  140  138   Potassium 3.5 - 5.1 mmol/L 3.5  3.8  3.7   Chloride 98 - 111 mmol/L 108  109  107   CO2 22 - 32 mmol/L _1 Calcium 8.9 - 10.3 mg/dL 9.1  9.3  9.1   Total Protein 6.5 - 8.1 g/dL 6.3  6.2  5.8   Total Bilirubin 0.3 - 1.2 mg/dL 1.3  1.5  1.5   Alkaline Phos 38 - 126 U/L 62  62  61   AST 15 - 41 U/L _2 ALT 0 - 44 U/L _3 DIAGNOSTIC IMAGING:  I have independently reviewed the scans and discussed with the patient. No results found.   ASSESSMENT:  MDS with EB 2: - Seen at the request of Dr. Mannie Stabile for abnormal CBC. - Labs on 12/06/2020 shows white count 3.0 with 41% neutrophils, 41% lymphocytes, 13.8% monocytes.  ANC was 1.2.  Hemoglobin 13.4 and normal.  MCV was slightly high at 96.  Platelet count was 128.  Vitamin W09 and folic acid were normal. - He took antibiotics for teeth implants few times this year, last 1 on 11/16/2020.  He does not know the antibiotic name.  He also reports taking over-the-counter pill for memory for 5 to 6 months, which was stopped around September 2022. - Labs from 07/07/2020 with creatinine 0.92.  White count 3.3 (37% neutrophils, 45% lymphocytes, 14% monocytes, 3% eosinophils), ANC 1.2.  Platelet count 149. - His prior CBC from 12/08/2017 in our system was completely normal. - Reports slight worsening of the night sweats in the last 3 months.  At least once per week and has to change clothes.  No fevers or weight loss. - BMBX on 06/04/2021: Hypercellular marrow with dyspoietic changes involving the granulocytic  cell line and megakaryocytes.  Myeloblasts 8% on aspirate smears and 10% by flow.  Cytogenetics 77, XY (20).  MDS FISH panel normal. 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 - IPSS-M score of 0.12, moderate high risk.  Leukemia free survival is 2.3 years.  Overall survival 2.8 years. - Cycle 1 of azacitidine on 07/30/2021   Social/family history: - He lives at home with his wife.  He swims about 40 minutes daily. 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  Non-smoker. - Paternal aunt had breast cancer and mother had pancreatic cancer.  3.  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 - He underwent thrombolytic therapy at [facility]. - He will followed up with vascular at [facility] and has been on Xarelto since then.   PLAN:  MDS with EB 2: - He has tolerated cycle 3 at regular dose 75 mg per metered square for 7 days very well. - He did not have any infections or other side effects.  Constipation was managed with Colace and prune juice. - Reviewed labs today which showed mildly elevated total bilirubin 1.3 with normal LFTs.  White count is 1.9 with absolute neutrophil count of 400.  Platelet count is normal. - Recommend proceeding with cycle 4 of Vidaza at full dose for 7 days. - RTC 4 weeks for follow-up.  We will consider repeating bone marrow biopsy after 4-6 cycles depending on blood counts.   Unprovoked left subclavian DVT: - Xarelto was discontinued due to hemorrhoidal bleeding.   Orders placed this encounter:  No orders of the defined types were placed in this encounter.    [name], MD [facility] [phone]   I, Thana Ates, am acting as a scribe for Dr. [name].  I, [name] MD, have reviewed the above documentation for accuracy and completeness, and I agree with the above.

## 2021-10-22 NOTE — Progress Notes (Signed)
Patient presents today for Vidaza, patient okay for treatment per Dr. Delton Coombes.  Patient tolerated chemotherapy with no complaints voiced. Side effects with management reviewed understanding verbalized. Port site clean and dry with no bruising or swelling noted at site. Good blood return noted before and after administration of chemotherapy. Band aid applied. Patient left in satisfactory condition with VSS and no s/s of distress noted.

## 2021-10-23 ENCOUNTER — Inpatient Hospital Stay: Payer: Medicare HMO

## 2021-10-23 ENCOUNTER — Other Ambulatory Visit: Payer: Self-pay

## 2021-10-23 VITALS — BP 136/88 | HR 63 | Temp 98.4°F | Resp 18

## 2021-10-23 DIAGNOSIS — Z95828 Presence of other vascular implants and grafts: Secondary | ICD-10-CM

## 2021-10-23 DIAGNOSIS — Z5111 Encounter for antineoplastic chemotherapy: Secondary | ICD-10-CM | POA: Diagnosis not present

## 2021-10-23 DIAGNOSIS — D469 Myelodysplastic syndrome, unspecified: Secondary | ICD-10-CM

## 2021-10-23 MED ORDER — SODIUM CHLORIDE 0.9% FLUSH
10.0000 mL | INTRAVENOUS | Status: DC | PRN
  Administered 2021-10-23: 10 mL

## 2021-10-23 MED ORDER — HEPARIN SOD (PORK) LOCK FLUSH 100 UNIT/ML IV SOLN
500.0000 [IU] | Freq: Once | INTRAVENOUS | Status: AC | PRN
  Administered 2021-10-23: 500 [IU]

## 2021-10-23 MED ORDER — SODIUM CHLORIDE 0.9 % IV SOLN
75.0000 mg/m2 | Freq: Once | INTRAVENOUS | Status: AC
  Administered 2021-10-23: 140 mg via INTRAVENOUS
  Filled 2021-10-23: qty 14

## 2021-10-23 MED ORDER — SODIUM CHLORIDE 0.9 % IV SOLN
10.0000 mg | Freq: Once | INTRAVENOUS | Status: AC
  Administered 2021-10-23: 10 mg via INTRAVENOUS
  Filled 2021-10-23: qty 10

## 2021-10-23 MED ORDER — SODIUM CHLORIDE 0.9 % IV SOLN: Freq: Once | INTRAVENOUS | Status: AC

## 2021-10-23 NOTE — Patient Instructions (Signed)
[facility]  Discharge Instructions: Thank you for choosing [facility] to provide your oncology and hematology care.  If you have a lab appointment with the [facility], please come in thru the Main Entrance and check in at the main information desk.  Wear comfortable clothing and clothing appropriate for easy access to any Portacath or PICC line.   We strive to give you quality time with your provider. You may need to reschedule your appointment if you arrive late (15 or more minutes).  Arriving late affects you and other patients whose appointments are after yours.  Also, if you miss three or more appointments without notifying the office, you may be dismissed from the clinic at the provider's discretion.      For prescription refill requests, have your pharmacy contact our office and allow 72 hours for refills to be completed.    Today you received the following chemotherapy and/or immunotherapy agents vidaza.  Azacitidine Injection What is this medication? AZACITIDINE (ay Mor[place]) treats blood and bone marrow cancers. It works by slowing down the growth of cancer cells. This medicine may be used for other purposes; ask your health care provider or pharmacist if you have questions. COMMON BRAND NAME(S): Vidaza What should I tell my care team before I take this medication? They need to know if you have any of these conditions: Kidney disease Liver disease Low blood cell levels, such as low white cells, platelets, or red blood cells Low levels of albumin in the blood Low levels of bicarbonate in the blood An unusual or allergic reaction to azacitidine, mannitol, other medications, foods, dyes, or preservatives If you or your partner are pregnant or trying to get pregnant Breast-feeding How should I use this medication? This medication is injected into a vein or under the skin. It is given by your care team in a hospital or clinic  setting. Talk to your care team about the use of this medication in children. While it may be prescribed for children as young as 1 month for selected conditions, precautions do apply. Overdosage: If you think you have taken too much of this medicine contact a poison control center or emergency room at once. NOTE: This medicine is only for you. Do not share this medicine with others. What if I miss a dose? Keep appointments for follow-up doses. It is important not to miss your dose. Call your care team if you are unable to keep an appointment. What may interact with this medication? Interactions are not expected. This list may not describe all possible interactions. Give your health care provider a list of all the medicines, herbs, non-prescription drugs, or dietary supplements you use. Also tell them if you smoke, drink alcohol, or use illegal drugs. Some items may interact with your medicine. What should I watch for while using this medication? Your condition will be monitored carefully while you are receiving this medication. You may need blood work while taking this medication. This medication may make you feel generally unwell. This is not uncommon as chemotherapy can affect healthy cells as well as cancer cells. Report any side effects. Continue your course of treatment even though you feel ill unless your care team tells you to stop. Other product types may be available that contain the medication azacitidine. The injection and oral products should not be used in place of one another. Talk to your care team if you have questions. This medication can cause serious side  effects. To reduce the risk, your care team may give you other medications to take before receiving this one. Be sure to follow the directions from your care team. This medication may increase your risk of getting an infection. Call your care team for advice if you get a fever, chills, sore throat, or other symptoms of a cold or  flu. Do not treat yourself. Try to avoid being around people who are sick. Avoid taking medications that contain aspirin, acetaminophen, ibuprofen, naproxen, or ketoprofen unless instructed by your care team. These medications may hide a fever. This medication may increase your risk to bruise or bleed. Call your care team if you notice any unusual bleeding. Be careful brushing or flossing your teeth or using a toothpick because you may get an infection or bleed more easily. If you have any dental work done, tell your dentist you are receiving this medication. Talk to your care team if you or your partner may be pregnant. Serious birth defects can occur if you take this medication during pregnancy and for 6 months after the last dose. You will need a negative pregnancy test before starting this medication. Contraception is recommended while taking his medication and for 6 months after the last dose. Your care team can help you find the option that works for you. If your partner can get pregnant, use a condom during sex while taking this medication and for 3 months after the last dose. Do not breastfeed while taking this medication and for 1 week after the last dose. This medication may cause infertility. Talk to your care team if you are concerned about your fertility. What side effects may I notice from receiving this medication? Side effects that you should report to your care team as soon as possible: Allergic reactions--skin rash, itching, hives, swelling of the face, lips, tongue, or throat Infection--fever, chills, cough, sore throat, wounds that don't heal, pain or trouble when passing urine, general feeling of discomfort or being unwell Kidney injury--decrease in the amount of urine, swelling of the ankles, hands, or feet Liver injury--right upper belly pain, loss of appetite, nausea, light-colored stool, dark yellow or brown urine, yellowing skin or eyes, unusual weakness or fatigue Low red  blood cell level--unusual weakness or fatigue, dizziness, headache, trouble breathing Tumor lysis syndrome (TLS)--nausea, vomiting, diarrhea, decrease in the amount of urine, dark urine, unusual weakness or fatigue, confusion, muscle pain or cramps, fast or irregular heartbeat, joint pain Unusual bruising or bleeding Side effects that usually do not require medical attention (report to your care team if they continue or are bothersome): Constipation Diarrhea Nausea Pain, redness, or irritation at injection site Vomiting This list may not describe all possible side effects. Call your doctor for medical advice about side effects. You may report side effects to FDA at 1-800-FDA-1088. Where should I keep my medication? This medication is given in a hospital or clinic. It will not be stored at home. NOTE: This sheet is a summary. It may not cover all possible information. If you have questions about this medicine, talk to your doctor, pharmacist, or health care provider.  2023 Elsevier/Gold Standard (2021-07-19 00:00:00)       To help prevent nausea and vomiting after your treatment, we encourage you to take your nausea medication as directed.  BELOW ARE SYMPTOMS THAT SHOULD BE REPORTED IMMEDIATELY: *FEVER GREATER THAN 100.4 F (38 C) OR HIGHER *CHILLS OR SWEATING *NAUSEA AND VOMITING THAT IS NOT CONTROLLED WITH YOUR NAUSEA MEDICATION *UNUSUAL SHORTNESS OF BREATH *  UNUSUAL BRUISING OR BLEEDING *URINARY PROBLEMS (pain or burning when urinating, or frequent urination) *BOWEL PROBLEMS (unusual diarrhea, constipation, pain near the anus) TENDERNESS IN MOUTH AND THROAT WITH OR WITHOUT PRESENCE OF ULCERS (sore throat, sores in mouth, or a toothache) UNUSUAL RASH, SWELLING OR PAIN  UNUSUAL VAGINAL DISCHARGE OR ITCHING   Items with * indicate a potential emergency and should be followed up as soon as possible or go to the Emergency Department if any problems should occur.  Please show the  CHEMOTHERAPY ALERT CARD or IMMUNOTHERAPY ALERT CARD at check-in to the Emergency Department and triage nurse.  Should you have questions after your visit or need to cancel or reschedule your appointment, please contact [facility] [phone]  and follow the prompts.  Office hours are 8:00 a.m. to 4:30 p.m. Monday - Friday. Please note that voicemails left after 4:00 p.m. may not be returned until the following business day.  We are closed weekends and major holidays. You have access to a nurse at all times for urgent questions. Please call the main number to the clinic [phone] and follow the prompts.  For any non-urgent questions, you may also contact your provider using MyChart. We now offer e-Visits for anyone 38 and older to request care online for non-urgent symptoms. For details visit [email or web address].   Also download the MyChart app! Go to the app store, search "MyChart", open the app, select [facility], and log in with your MyChart username and password.  Masks are optional in the cancer centers. If you would like for your care team to wear a mask while they are taking care of you, please let them know. For doctor visits, patients may have with them one support person who is at least 77 years old. At this time, visitors are not allowed in the infusion area.

## 2021-10-23 NOTE — Progress Notes (Signed)

## 2021-10-24 ENCOUNTER — Inpatient Hospital Stay: Payer: Medicare HMO

## 2021-10-24 VITALS — BP 126/79 | HR 64 | Temp 97.0°F | Resp 18

## 2021-10-24 DIAGNOSIS — D469 Myelodysplastic syndrome, unspecified: Secondary | ICD-10-CM

## 2021-10-24 DIAGNOSIS — Z95828 Presence of other vascular implants and grafts: Secondary | ICD-10-CM

## 2021-10-24 DIAGNOSIS — Z5111 Encounter for antineoplastic chemotherapy: Secondary | ICD-10-CM | POA: Diagnosis not present

## 2021-10-24 MED ORDER — PALONOSETRON HCL INJECTION 0.25 MG/5ML
0.2500 mg | Freq: Once | INTRAVENOUS | Status: AC
  Administered 2021-10-24: 0.25 mg via INTRAVENOUS

## 2021-10-24 MED ORDER — SODIUM CHLORIDE 0.9 % IV SOLN: Freq: Once | INTRAVENOUS | Status: AC

## 2021-10-24 MED ORDER — SODIUM CHLORIDE 0.9 % IV SOLN
10.0000 mg | Freq: Once | INTRAVENOUS | Status: AC
  Administered 2021-10-24: 10 mg via INTRAVENOUS
  Filled 2021-10-24: qty 1

## 2021-10-24 MED ORDER — SODIUM CHLORIDE 0.9% FLUSH
10.0000 mL | INTRAVENOUS | Status: DC | PRN
  Administered 2021-10-24: 10 mL

## 2021-10-24 MED ORDER — SODIUM CHLORIDE 0.9 % IV SOLN
75.0000 mg/m2 | Freq: Once | INTRAVENOUS | Status: AC
  Administered 2021-10-24: 140 mg via INTRAVENOUS
  Filled 2021-10-24: qty 14

## 2021-10-24 MED ORDER — HEPARIN SOD (PORK) LOCK FLUSH 100 UNIT/ML IV SOLN
500.0000 [IU] | Freq: Once | INTRAVENOUS | Status: AC | PRN
  Administered 2021-10-24: 500 [IU]

## 2021-10-24 NOTE — Progress Notes (Signed)
Treatment given per orders. Patient tolerated it well without problems. Vitals stable and discharged home from clinic ambulatory. Follow up as scheduled.

## 2021-10-24 NOTE — Patient Instructions (Signed)
[facility]  Discharge Instructions: Thank you for choosing [facility] to provide your oncology and hematology care.  If you have a lab appointment with the [facility], please come in thru the Main Entrance and check in at the main information desk.  Wear comfortable clothing and clothing appropriate for easy access to any Portacath or PICC line.   We strive to give you quality time with your provider. You may need to reschedule your appointment if you arrive late (15 or more minutes).  Arriving late affects you and other patients whose appointments are after yours.  Also, if you miss three or more appointments without notifying the office, you may be dismissed from the clinic at the provider's discretion.      For prescription refill requests, have your pharmacy contact our office and allow 72 hours for refills to be completed.    Today you received the following chemotherapy vidaza      To help prevent nausea and vomiting after your treatment, we encourage you to take your nausea medication as directed.  BELOW ARE SYMPTOMS THAT SHOULD BE REPORTED IMMEDIATELY: *FEVER GREATER THAN 100.4 F (38 C) OR HIGHER *CHILLS OR SWEATING *NAUSEA AND VOMITING THAT IS NOT CONTROLLED WITH YOUR NAUSEA MEDICATION *UNUSUAL SHORTNESS OF BREATH *UNUSUAL BRUISING OR BLEEDING *URINARY PROBLEMS (pain or burning when urinating, or frequent urination) *BOWEL PROBLEMS (unusual diarrhea, constipation, pain near the anus) TENDERNESS IN MOUTH AND THROAT WITH OR WITHOUT PRESENCE OF ULCERS (sore throat, sores in mouth, or a toothache) UNUSUAL RASH, SWELLING OR PAIN  UNUSUAL VAGINAL DISCHARGE OR ITCHING   Items with * indicate a potential emergency and should be followed up as soon as possible or go to the Emergency Department if any problems should occur.  Please show the CHEMOTHERAPY ALERT CARD or IMMUNOTHERAPY ALERT CARD at check-in to the Emergency Department and triage  nurse.  Should you have questions after your visit or need to cancel or reschedule your appointment, please contact [facility] [phone]  and follow the prompts.  Office hours are 8:00 a.m. to 4:30 p.m. Monday - Friday. Please note that voicemails left after 4:00 p.m. may not be returned until the following business day.  We are closed weekends and major holidays. You have access to a nurse at all times for urgent questions. Please call the main number to the clinic [phone] and follow the prompts.  For any non-urgent questions, you may also contact your provider using MyChart. We now offer e-Visits for anyone 57 and older to request care online for non-urgent symptoms. For details visit [email or web address].   Also download the MyChart app! Go to the app store, search "MyChart", open the app, select [facility], and log in with your MyChart username and password.  Masks are optional in the cancer centers. If you would like for your care team to wear a mask while they are taking care of you, please let them know. For doctor visits, patients may have with them one support person who is at least 77 years old. At this time, visitors are not allowed in the infusion area.

## 2021-10-25 ENCOUNTER — Inpatient Hospital Stay: Payer: Medicare HMO

## 2021-10-25 VITALS — BP 127/77 | HR 61 | Temp 97.8°F | Resp 18

## 2021-10-25 DIAGNOSIS — Z95828 Presence of other vascular implants and grafts: Secondary | ICD-10-CM

## 2021-10-25 DIAGNOSIS — D469 Myelodysplastic syndrome, unspecified: Secondary | ICD-10-CM

## 2021-10-25 DIAGNOSIS — Z5111 Encounter for antineoplastic chemotherapy: Secondary | ICD-10-CM | POA: Diagnosis not present

## 2021-10-25 MED ORDER — HEPARIN SOD (PORK) LOCK FLUSH 100 UNIT/ML IV SOLN
500.0000 [IU] | Freq: Once | INTRAVENOUS | Status: AC | PRN
  Administered 2021-10-25: 500 [IU]

## 2021-10-25 MED ORDER — SODIUM CHLORIDE 0.9% FLUSH
10.0000 mL | INTRAVENOUS | Status: DC | PRN
  Administered 2021-10-25: 10 mL

## 2021-10-25 MED ORDER — SODIUM CHLORIDE 0.9 % IV SOLN: Freq: Once | INTRAVENOUS | Status: AC

## 2021-10-25 MED ORDER — SODIUM CHLORIDE 0.9 % IV SOLN
10.0000 mg | Freq: Once | INTRAVENOUS | Status: AC
  Administered 2021-10-25: 10 mg via INTRAVENOUS
  Filled 2021-10-25: qty 10

## 2021-10-25 MED ORDER — SODIUM CHLORIDE 0.9 % IV SOLN
75.0000 mg/m2 | Freq: Once | INTRAVENOUS | Status: AC
  Administered 2021-10-25: 140 mg via INTRAVENOUS
  Filled 2021-10-25: qty 14

## 2021-10-25 NOTE — Patient Instructions (Signed)
[facility]  Discharge Instructions: Thank you for choosing [facility] to provide your oncology and hematology care.  If you have a lab appointment with the [facility], please come in thru the Main Entrance and check in at the main information desk.  Wear comfortable clothing and clothing appropriate for easy access to any Portacath or PICC line.   We strive to give you quality time with your provider. You may need to reschedule your appointment if you arrive late (15 or more minutes).  Arriving late affects you and other patients whose appointments are after yours.  Also, if you miss three or more appointments without notifying the office, you may be dismissed from the clinic at the provider's discretion.      For prescription refill requests, have your pharmacy contact our office and allow 72 hours for refills to be completed.    Today you received the following chemotherapy and/or immunotherapy agents Vidaza, return as scheduled.   To help prevent nausea and vomiting after your treatment, we encourage you to take your nausea medication as directed.  BELOW ARE SYMPTOMS THAT SHOULD BE REPORTED IMMEDIATELY: *FEVER GREATER THAN 100.4 F (38 C) OR HIGHER *CHILLS OR SWEATING *NAUSEA AND VOMITING THAT IS NOT CONTROLLED WITH YOUR NAUSEA MEDICATION *UNUSUAL SHORTNESS OF BREATH *UNUSUAL BRUISING OR BLEEDING *URINARY PROBLEMS (pain or burning when urinating, or frequent urination) *BOWEL PROBLEMS (unusual diarrhea, constipation, pain near the anus) TENDERNESS IN MOUTH AND THROAT WITH OR WITHOUT PRESENCE OF ULCERS (sore throat, sores in mouth, or a toothache) UNUSUAL RASH, SWELLING OR PAIN  UNUSUAL VAGINAL DISCHARGE OR ITCHING   Items with * indicate a potential emergency and should be followed up as soon as possible or go to the Emergency Department if any problems should occur.  Please show the CHEMOTHERAPY ALERT CARD or IMMUNOTHERAPY ALERT CARD at  check-in to the Emergency Department and triage nurse.  Should you have questions after your visit or need to cancel or reschedule your appointment, please contact [facility] [phone]  and follow the prompts.  Office hours are 8:00 a.m. to 4:30 p.m. Monday - Friday. Please note that voicemails left after 4:00 p.m. may not be returned until the following business day.  We are closed weekends and major holidays. You have access to a nurse at all times for urgent questions. Please call the main number to the clinic [phone] and follow the prompts.  For any non-urgent questions, you may also contact your provider using MyChart. We now offer e-Visits for anyone 75 and older to request care online for non-urgent symptoms. For details visit [email or web address].   Also download the MyChart app! Go to the app store, search "MyChart", open the app, select [facility], and log in with your MyChart username and password.  Masks are optional in the cancer centers. If you would like for your care team to wear a mask while they are taking care of you, please let them know. For doctor visits, patients may have with them one support person who is at least 77 years old. At this time, visitors are not allowed in the infusion area.

## 2021-10-25 NOTE — Progress Notes (Signed)
Patient tolerated chemotherapy with no complaints voiced. Side effects with management reviewed understanding verbalized. Port site clean and dry with no bruising or swelling noted at site. Good blood return noted before and after administration of chemotherapy. Band aid applied. Patient left in satisfactory condition with VSS and no s/s of distress noted.

## 2021-10-26 ENCOUNTER — Encounter: Payer: Self-pay | Admitting: Hematology

## 2021-10-26 ENCOUNTER — Inpatient Hospital Stay: Payer: Medicare HMO

## 2021-10-26 VITALS — BP 142/95 | HR 73 | Temp 98.0°F | Resp 18

## 2021-10-26 DIAGNOSIS — D469 Myelodysplastic syndrome, unspecified: Secondary | ICD-10-CM

## 2021-10-26 DIAGNOSIS — Z95828 Presence of other vascular implants and grafts: Secondary | ICD-10-CM

## 2021-10-26 DIAGNOSIS — Z5111 Encounter for antineoplastic chemotherapy: Secondary | ICD-10-CM | POA: Diagnosis not present

## 2021-10-26 MED ORDER — SODIUM CHLORIDE 0.9 % IV SOLN
10.0000 mg | Freq: Once | INTRAVENOUS | Status: AC
  Administered 2021-10-26: 10 mg via INTRAVENOUS
  Filled 2021-10-26: qty 10

## 2021-10-26 MED ORDER — HEPARIN SOD (PORK) LOCK FLUSH 100 UNIT/ML IV SOLN
500.0000 [IU] | Freq: Once | INTRAVENOUS | Status: AC | PRN
  Administered 2021-10-26: 500 [IU]

## 2021-10-26 MED ORDER — SODIUM CHLORIDE 0.9 % IV SOLN
75.0000 mg/m2 | Freq: Once | INTRAVENOUS | Status: AC
  Administered 2021-10-26: 140 mg via INTRAVENOUS
  Filled 2021-10-26: qty 14

## 2021-10-26 MED ORDER — SODIUM CHLORIDE 0.9% FLUSH
10.0000 mL | INTRAVENOUS | Status: DC | PRN
  Administered 2021-10-26: 10 mL

## 2021-10-26 MED ORDER — PALONOSETRON HCL INJECTION 0.25 MG/5ML
0.2500 mg | Freq: Once | INTRAVENOUS | Status: AC
  Administered 2021-10-26: 0.25 mg via INTRAVENOUS

## 2021-10-26 MED ORDER — SODIUM CHLORIDE 0.9 % IV SOLN: Freq: Once | INTRAVENOUS | Status: AC

## 2021-10-26 NOTE — Progress Notes (Signed)
Treatment given per orders. Patient tolerated it well without problems. Vitals stable and discharged home from clinic ambulatory. Follow up as scheduled.

## 2021-10-26 NOTE — Patient Instructions (Signed)
[facility]  Discharge Instructions: Thank you for choosing [facility] to provide your oncology and hematology care.  If you have a lab appointment with the [facility], please come in thru the Main Entrance and check in at the main information desk.  Wear comfortable clothing and clothing appropriate for easy access to any Portacath or PICC line.   We strive to give you quality time with your provider. You may need to reschedule your appointment if you arrive late (15 or more minutes).  Arriving late affects you and other patients whose appointments are after yours.  Also, if you miss three or more appointments without notifying the office, you may be dismissed from the clinic at the provider's discretion.      For prescription refill requests, have your pharmacy contact our office and allow 72 hours for refills to be completed.      To help prevent nausea and vomiting after your treatment, we encourage you to take your nausea medication as directed.  BELOW ARE SYMPTOMS THAT SHOULD BE REPORTED IMMEDIATELY: *FEVER GREATER THAN 100.4 F (38 C) OR HIGHER *CHILLS OR SWEATING *NAUSEA AND VOMITING THAT IS NOT CONTROLLED WITH YOUR NAUSEA MEDICATION *UNUSUAL SHORTNESS OF BREATH *UNUSUAL BRUISING OR BLEEDING *URINARY PROBLEMS (pain or burning when urinating, or frequent urination) *BOWEL PROBLEMS (unusual diarrhea, constipation, pain near the anus) TENDERNESS IN MOUTH AND THROAT WITH OR WITHOUT PRESENCE OF ULCERS (sore throat, sores in mouth, or a toothache) UNUSUAL RASH, SWELLING OR PAIN  UNUSUAL VAGINAL DISCHARGE OR ITCHING   Items with * indicate a potential emergency and should be followed up as soon as possible or go to the Emergency Department if any problems should occur.  Please show the CHEMOTHERAPY ALERT CARD or IMMUNOTHERAPY ALERT CARD at check-in to the Emergency Department and triage nurse.  Should you have questions after your visit or need to  cancel or reschedule your appointment, please contact [facility] [phone]  and follow the prompts.  Office hours are 8:00 a.m. to 4:30 p.m. Monday - Friday. Please note that voicemails left after 4:00 p.m. may not be returned until the following business day.  We are closed weekends and major holidays. You have access to a nurse at all times for urgent questions. Please call the main number to the clinic [phone] and follow the prompts.  For any non-urgent questions, you may also contact your provider using MyChart. We now offer e-Visits for anyone 52 and older to request care online for non-urgent symptoms. For details visit [email or web address].   Also download the MyChart app! Go to the app store, search "MyChart", open the app, select [facility], and log in with your MyChart username and password.  Masks are optional in the cancer centers. If you would like for your care team to wear a mask while they are taking care of you, please let them know. For doctor visits, patients may have with them one support person who is at least 77 years old. At this time, visitors are not allowed in the infusion area.

## 2021-10-29 ENCOUNTER — Inpatient Hospital Stay: Payer: Medicare HMO

## 2021-10-29 VITALS — BP 105/62 | HR 59 | Temp 97.9°F | Resp 18 | Wt 154.6 lb

## 2021-10-29 DIAGNOSIS — Z95828 Presence of other vascular implants and grafts: Secondary | ICD-10-CM

## 2021-10-29 DIAGNOSIS — D469 Myelodysplastic syndrome, unspecified: Secondary | ICD-10-CM

## 2021-10-29 DIAGNOSIS — Z5111 Encounter for antineoplastic chemotherapy: Secondary | ICD-10-CM | POA: Diagnosis not present

## 2021-10-29 MED ORDER — SODIUM CHLORIDE 0.9 % IV SOLN: Freq: Once | INTRAVENOUS | Status: AC

## 2021-10-29 MED ORDER — SODIUM CHLORIDE 0.9 % IV SOLN
75.0000 mg/m2 | Freq: Once | INTRAVENOUS | Status: AC
  Administered 2021-10-29: 140 mg via INTRAVENOUS
  Filled 2021-10-29: qty 14

## 2021-10-29 MED ORDER — SODIUM CHLORIDE 0.9% FLUSH
10.0000 mL | INTRAVENOUS | Status: DC | PRN
  Administered 2021-10-29: 10 mL

## 2021-10-29 MED ORDER — PALONOSETRON HCL INJECTION 0.25 MG/5ML
0.2500 mg | Freq: Once | INTRAVENOUS | Status: AC
  Administered 2021-10-29: 0.25 mg via INTRAVENOUS

## 2021-10-29 MED ORDER — SODIUM CHLORIDE 0.9 % IV SOLN
10.0000 mg | Freq: Once | INTRAVENOUS | Status: AC
  Administered 2021-10-29: 10 mg via INTRAVENOUS
  Filled 2021-10-29: qty 10

## 2021-10-29 MED ORDER — HEPARIN SOD (PORK) LOCK FLUSH 100 UNIT/ML IV SOLN
500.0000 [IU] | Freq: Once | INTRAVENOUS | Status: AC | PRN
  Administered 2021-10-29: 500 [IU]

## 2021-10-29 NOTE — Patient Instructions (Signed)
[facility]  Discharge Instructions: Thank you for choosing [facility] to provide your oncology and hematology care.  If you have a lab appointment with the [facility], please come in thru the Main Entrance and check in at the main information desk.  Wear comfortable clothing and clothing appropriate for easy access to any Portacath or PICC line.   We strive to give you quality time with your provider. You may need to reschedule your appointment if you arrive late (15 or more minutes).  Arriving late affects you and other patients whose appointments are after yours.  Also, if you miss three or more appointments without notifying the office, you may be dismissed from the clinic at the provider's discretion.      For prescription refill requests, have your pharmacy contact our office and allow 72 hours for refills to be completed.    Today you received the following chemotherapy and/or immunotherapy agents D6 Vidaza   To help prevent nausea and vomiting after your treatment, we encourage you to take your nausea medication as directed.  BELOW ARE SYMPTOMS THAT SHOULD BE REPORTED IMMEDIATELY: *FEVER GREATER THAN 100.4 F (38 C) OR HIGHER *CHILLS OR SWEATING *NAUSEA AND VOMITING THAT IS NOT CONTROLLED WITH YOUR NAUSEA MEDICATION *UNUSUAL SHORTNESS OF BREATH *UNUSUAL BRUISING OR BLEEDING *URINARY PROBLEMS (pain or burning when urinating, or frequent urination) *BOWEL PROBLEMS (unusual diarrhea, constipation, pain near the anus) TENDERNESS IN MOUTH AND THROAT WITH OR WITHOUT PRESENCE OF ULCERS (sore throat, sores in mouth, or a toothache) UNUSUAL RASH, SWELLING OR PAIN  UNUSUAL VAGINAL DISCHARGE OR ITCHING   Items with * indicate a potential emergency and should be followed up as soon as possible or go to the Emergency Department if any problems should occur.  Please show the CHEMOTHERAPY ALERT CARD or IMMUNOTHERAPY ALERT CARD at check-in to the Emergency  Department and triage nurse.  Should you have questions after your visit or need to cancel or reschedule your appointment, please contact [facility] [phone]  and follow the prompts.  Office hours are 8:00 a.m. to 4:30 p.m. Monday - Friday. Please note that voicemails left after 4:00 p.m. may not be returned until the following business day.  We are closed weekends and major holidays. You have access to a nurse at all times for urgent questions. Please call the main number to the clinic [phone] and follow the prompts.  For any non-urgent questions, you may also contact your provider using MyChart. We now offer e-Visits for anyone 59 and older to request care online for non-urgent symptoms. For details visit [email or web address].   Also download the MyChart app! Go to the app store, search "MyChart", open the app, select [facility], and log in with your MyChart username and password.  Masks are optional in the cancer centers. If you would like for your care team to wear a mask while they are taking care of you, please let them know. For doctor visits, patients may have with them one support person who is at least 77 years old. At this time, visitors are not allowed in the infusion area.  Azacitidine Injection What is this medication? AZACITIDINE (ay [place]) treats blood and bone marrow cancers. It works by slowing down the growth of cancer cells. This medicine may be used for other purposes; ask your health care provider or pharmacist if you have questions. COMMON BRAND NAME(S): Vidaza What should I tell my care team before  I take this medication? They need to know if you have any of these conditions: Kidney disease Liver disease Low blood cell levels, such as low white cells, platelets, or red blood cells Low levels of albumin in the blood Low levels of bicarbonate in the blood An unusual or allergic reaction to azacitidine, mannitol, other  medications, foods, dyes, or preservatives If you or your partner are pregnant or trying to get pregnant Breast-feeding How should I use this medication? This medication is injected into a vein or under the skin. It is given by your care team in a hospital or clinic setting. Talk to your care team about the use of this medication in children. While it may be prescribed for children as young as 1 month for selected conditions, precautions do apply. Overdosage: If you think you have taken too much of this medicine contact a poison control center or emergency room at once. NOTE: This medicine is only for you. Do not share this medicine with others. What if I miss a dose? Keep appointments for follow-up doses. It is important not to miss your dose. Call your care team if you are unable to keep an appointment. What may interact with this medication? Interactions are not expected. This list may not describe all possible interactions. Give your health care provider a list of all the medicines, herbs, non-prescription drugs, or dietary supplements you use. Also tell them if you smoke, drink alcohol, or use illegal drugs. Some items may interact with your medicine. What should I watch for while using this medication? Your condition will be monitored carefully while you are receiving this medication. You may need blood work while taking this medication. This medication may make you feel generally unwell. This is not uncommon as chemotherapy can affect healthy cells as well as cancer cells. Report any side effects. Continue your course of treatment even though you feel ill unless your care team tells you to stop. Other product types may be available that contain the medication azacitidine. The injection and oral products should not be used in place of one another. Talk to your care team if you have questions. This medication can cause serious side effects. To reduce the risk, your care team may give you other  medications to take before receiving this one. Be sure to follow the directions from your care team. This medication may increase your risk of getting an infection. Call your care team for advice if you get a fever, chills, sore throat, or other symptoms of a cold or flu. Do not treat yourself. Try to avoid being around people who are sick. Avoid taking medications that contain aspirin, acetaminophen, ibuprofen, naproxen, or ketoprofen unless instructed by your care team. These medications may hide a fever. This medication may increase your risk to bruise or bleed. Call your care team if you notice any unusual bleeding. Be careful brushing or flossing your teeth or using a toothpick because you may get an infection or bleed more easily. If you have any dental work done, tell your dentist you are receiving this medication. Talk to your care team if you or your partner may be pregnant. Serious birth defects can occur if you take this medication during pregnancy and for 6 months after the last dose. You will need a negative pregnancy test before starting this medication. Contraception is recommended while taking his medication and for 6 months after the last dose. Your care team can help you find the option that works for you.  If your partner can get pregnant, use a condom during sex while taking this medication and for 3 months after the last dose. Do not breastfeed while taking this medication and for 1 week after the last dose. This medication may cause infertility. Talk to your care team if you are concerned about your fertility. What side effects may I notice from receiving this medication? Side effects that you should report to your care team as soon as possible: Allergic reactions--skin rash, itching, hives, swelling of the face, lips, tongue, or throat Infection--fever, chills, cough, sore throat, wounds that don't heal, pain or trouble when passing urine, general feeling of discomfort or being  unwell Kidney injury--decrease in the amount of urine, swelling of the ankles, hands, or feet Liver injury--right upper belly pain, loss of appetite, nausea, light-colored stool, dark yellow or brown urine, yellowing skin or eyes, unusual weakness or fatigue Low red blood cell level--unusual weakness or fatigue, dizziness, headache, trouble breathing Tumor lysis syndrome (TLS)--nausea, vomiting, diarrhea, decrease in the amount of urine, dark urine, unusual weakness or fatigue, confusion, muscle pain or cramps, fast or irregular heartbeat, joint pain Unusual bruising or bleeding Side effects that usually do not require medical attention (report to your care team if they continue or are bothersome): Constipation Diarrhea Nausea Pain, redness, or irritation at injection site Vomiting This list may not describe all possible side effects. Call your doctor for medical advice about side effects. You may report side effects to FDA at 1-800-FDA-1088. Where should I keep my medication? This medication is given in a hospital or clinic. It will not be stored at home. NOTE: This sheet is a summary. It may not cover all possible information. If you have questions about this medicine, talk to your doctor, pharmacist, or health care provider.  2023 Elsevier/Gold Standard (2021-07-19 00:00:00)

## 2021-10-29 NOTE — Progress Notes (Signed)
Pt presents today for D6 Vidaza per provider's order. Vital signs stable and pt voiced no new complaints at this time.  D6 Vidaza given today per MD orders. Tolerated infusion without adverse affects. Vital signs stable. No complaints at this time. Discharged from clinic ambulatory in stable condition. Alert and oriented x 3. F/U with [facility] as scheduled.

## 2021-10-30 ENCOUNTER — Inpatient Hospital Stay: Payer: Medicare HMO

## 2021-10-30 VITALS — BP 130/75 | HR 61 | Temp 97.0°F | Resp 18

## 2021-10-30 DIAGNOSIS — Z5111 Encounter for antineoplastic chemotherapy: Secondary | ICD-10-CM | POA: Diagnosis not present

## 2021-10-30 DIAGNOSIS — D469 Myelodysplastic syndrome, unspecified: Secondary | ICD-10-CM

## 2021-10-30 DIAGNOSIS — Z95828 Presence of other vascular implants and grafts: Secondary | ICD-10-CM

## 2021-10-30 MED ORDER — SODIUM CHLORIDE 0.9 % IV SOLN
10.0000 mg | Freq: Once | INTRAVENOUS | Status: AC
  Administered 2021-10-30: 10 mg via INTRAVENOUS
  Filled 2021-10-30: qty 10

## 2021-10-30 MED ORDER — HEPARIN SOD (PORK) LOCK FLUSH 100 UNIT/ML IV SOLN
500.0000 [IU] | Freq: Once | INTRAVENOUS | Status: AC | PRN
  Administered 2021-10-30: 500 [IU]

## 2021-10-30 MED ORDER — SODIUM CHLORIDE 0.9 % IV SOLN
75.0000 mg/m2 | Freq: Once | INTRAVENOUS | Status: AC
  Administered 2021-10-30: 140 mg via INTRAVENOUS
  Filled 2021-10-30: qty 14

## 2021-10-30 MED ORDER — SODIUM CHLORIDE 0.9 % IV SOLN: Freq: Once | INTRAVENOUS | Status: AC

## 2021-10-30 MED ORDER — SODIUM CHLORIDE 0.9% FLUSH
10.0000 mL | INTRAVENOUS | Status: DC | PRN
  Administered 2021-10-30: 10 mL

## 2021-10-30 NOTE — Patient Instructions (Signed)
[facility]  Discharge Instructions: Thank you for choosing [facility] to provide your oncology and hematology care.  If you have a lab appointment with the [facility], please come in thru the Main Entrance and check in at the main information desk.  Wear comfortable clothing and clothing appropriate for easy access to any Portacath or PICC line.   We strive to give you quality time with your provider. You may need to reschedule your appointment if you arrive late (15 or more minutes).  Arriving late affects you and other patients whose appointments are after yours.  Also, if you miss three or more appointments without notifying the office, you may be dismissed from the clinic at the provider's discretion.      For prescription refill requests, have your pharmacy contact our office and allow 72 hours for refills to be completed.    Today you received the following chemotherapy and/or immunotherapy agents Vidaza   To help prevent nausea and vomiting after your treatment, we encourage you to take your nausea medication as directed.  BELOW ARE SYMPTOMS THAT SHOULD BE REPORTED IMMEDIATELY: *FEVER GREATER THAN 100.4 F (38 C) OR HIGHER *CHILLS OR SWEATING *NAUSEA AND VOMITING THAT IS NOT CONTROLLED WITH YOUR NAUSEA MEDICATION *UNUSUAL SHORTNESS OF BREATH *UNUSUAL BRUISING OR BLEEDING *URINARY PROBLEMS (pain or burning when urinating, or frequent urination) *BOWEL PROBLEMS (unusual diarrhea, constipation, pain near the anus) TENDERNESS IN MOUTH AND THROAT WITH OR WITHOUT PRESENCE OF ULCERS (sore throat, sores in mouth, or a toothache) UNUSUAL RASH, SWELLING OR PAIN  UNUSUAL VAGINAL DISCHARGE OR ITCHING   Items with * indicate a potential emergency and should be followed up as soon as possible or go to the Emergency Department if any problems should occur.  Please show the CHEMOTHERAPY ALERT CARD or IMMUNOTHERAPY ALERT CARD at check-in to the Emergency  Department and triage nurse.  Should you have questions after your visit or need to cancel or reschedule your appointment, please contact [facility] [phone]  and follow the prompts.  Office hours are 8:00 a.m. to 4:30 p.m. Monday - Friday. Please note that voicemails left after 4:00 p.m. may not be returned until the following business day.  We are closed weekends and major holidays. You have access to a nurse at all times for urgent questions. Please call the main number to the clinic [phone] and follow the prompts.  For any non-urgent questions, you may also contact your provider using MyChart. We now offer e-Visits for anyone 32 and older to request care online for non-urgent symptoms. For details visit [email or web address].   Also download the MyChart app! Go to the app store, search "MyChart", open the app, select [facility], and log in with your MyChart username and password.  Masks are optional in the cancer centers. If you would like for your care team to wear a mask while they are taking care of you, please let them know. For doctor visits, patients may have with them one support person who is at least 77 years old. At this time, visitors are not allowed in the infusion area.  Azacitidine Injection What is this medication? AZACITIDINE (ay Dickson) treats blood and bone marrow cancers. It works by slowing down the growth of cancer cells. This medicine may be used for other purposes; ask your health care provider or pharmacist if you have questions. COMMON BRAND NAME(S): Vidaza What should I tell my care team before I  take this medication? They need to know if you have any of these conditions: Kidney disease Liver disease Low blood cell levels, such as low white cells, platelets, or red blood cells Low levels of albumin in the blood Low levels of bicarbonate in the blood An unusual or allergic reaction to azacitidine, mannitol, other  medications, foods, dyes, or preservatives If you or your partner are pregnant or trying to get pregnant Breast-feeding How should I use this medication? This medication is injected into a vein or under the skin. It is given by your care team in a hospital or clinic setting. Talk to your care team about the use of this medication in children. While it may be prescribed for children as young as 1 month for selected conditions, precautions do apply. Overdosage: If you think you have taken too much of this medicine contact a poison control center or emergency room at once. NOTE: This medicine is only for you. Do not share this medicine with others. What if I miss a dose? Keep appointments for follow-up doses. It is important not to miss your dose. Call your care team if you are unable to keep an appointment. What may interact with this medication? Interactions are not expected. This list may not describe all possible interactions. Give your health care provider a list of all the medicines, herbs, non-prescription drugs, or dietary supplements you use. Also tell them if you smoke, drink alcohol, or use illegal drugs. Some items may interact with your medicine. What should I watch for while using this medication? Your condition will be monitored carefully while you are receiving this medication. You may need blood work while taking this medication. This medication may make you feel generally unwell. This is not uncommon as chemotherapy can affect healthy cells as well as cancer cells. Report any side effects. Continue your course of treatment even though you feel ill unless your care team tells you to stop. Other product types may be available that contain the medication azacitidine. The injection and oral products should not be used in place of one another. Talk to your care team if you have questions. This medication can cause serious side effects. To reduce the risk, your care team may give you other  medications to take before receiving this one. Be sure to follow the directions from your care team. This medication may increase your risk of getting an infection. Call your care team for advice if you get a fever, chills, sore throat, or other symptoms of a cold or flu. Do not treat yourself. Try to avoid being around people who are sick. Avoid taking medications that contain aspirin, acetaminophen, ibuprofen, naproxen, or ketoprofen unless instructed by your care team. These medications may hide a fever. This medication may increase your risk to bruise or bleed. Call your care team if you notice any unusual bleeding. Be careful brushing or flossing your teeth or using a toothpick because you may get an infection or bleed more easily. If you have any dental work done, tell your dentist you are receiving this medication. Talk to your care team if you or your partner may be pregnant. Serious birth defects can occur if you take this medication during pregnancy and for 6 months after the last dose. You will need a negative pregnancy test before starting this medication. Contraception is recommended while taking his medication and for 6 months after the last dose. Your care team can help you find the option that works for you. If  your partner can get pregnant, use a condom during sex while taking this medication and for 3 months after the last dose. Do not breastfeed while taking this medication and for 1 week after the last dose. This medication may cause infertility. Talk to your care team if you are concerned about your fertility. What side effects may I notice from receiving this medication? Side effects that you should report to your care team as soon as possible: Allergic reactions--skin rash, itching, hives, swelling of the face, lips, tongue, or throat Infection--fever, chills, cough, sore throat, wounds that don't heal, pain or trouble when passing urine, general feeling of discomfort or being  unwell Kidney injury--decrease in the amount of urine, swelling of the ankles, hands, or feet Liver injury--right upper belly pain, loss of appetite, nausea, light-colored stool, dark yellow or brown urine, yellowing skin or eyes, unusual weakness or fatigue Low red blood cell level--unusual weakness or fatigue, dizziness, headache, trouble breathing Tumor lysis syndrome (TLS)--nausea, vomiting, diarrhea, decrease in the amount of urine, dark urine, unusual weakness or fatigue, confusion, muscle pain or cramps, fast or irregular heartbeat, joint pain Unusual bruising or bleeding Side effects that usually do not require medical attention (report to your care team if they continue or are bothersome): Constipation Diarrhea Nausea Pain, redness, or irritation at injection site Vomiting This list may not describe all possible side effects. Call your doctor for medical advice about side effects. You may report side effects to FDA at 1-800-FDA-1088. Where should I keep my medication? This medication is given in a hospital or clinic. It will not be stored at home. NOTE: This sheet is a summary. It may not cover all possible information. If you have questions about this medicine, talk to your doctor, pharmacist, or health care provider.  2023 Elsevier/Gold Standard (2021-07-19 00:00:00)

## 2021-10-30 NOTE — Progress Notes (Signed)
Pt presents today for D7 Vidaza per provider's order. Vital signs stable and pt voiced no new complaints at this time.   D7 Vidaza given today per MD orders. Tolerated infusion without adverse affects. Vital signs stable. No complaints at this time. Discharged from clinic ambulatory in stable condition. Alert and oriented x 3. F/U with [facility] as scheduled.

## 2021-11-05 ENCOUNTER — Other Ambulatory Visit: Payer: Self-pay

## 2021-11-19 ENCOUNTER — Other Ambulatory Visit: Payer: Self-pay | Admitting: Hematology

## 2021-11-19 DIAGNOSIS — D469 Myelodysplastic syndrome, unspecified: Secondary | ICD-10-CM

## 2021-11-19 DIAGNOSIS — Z95828 Presence of other vascular implants and grafts: Secondary | ICD-10-CM

## 2021-11-20 ENCOUNTER — Ambulatory Visit: Payer: Medicare HMO | Admitting: Hematology

## 2021-11-20 ENCOUNTER — Inpatient Hospital Stay: Payer: Medicare HMO

## 2021-11-20 ENCOUNTER — Inpatient Hospital Stay: Payer: Medicare HMO | Attending: Hematology

## 2021-11-20 ENCOUNTER — Other Ambulatory Visit: Payer: Medicare HMO

## 2021-11-20 ENCOUNTER — Inpatient Hospital Stay (HOSPITAL_BASED_OUTPATIENT_CLINIC_OR_DEPARTMENT_OTHER): Payer: Medicare HMO | Admitting: Hematology

## 2021-11-20 VITALS — BP 147/91 | HR 62 | Temp 97.7°F | Resp 18

## 2021-11-20 DIAGNOSIS — D469 Myelodysplastic syndrome, unspecified: Secondary | ICD-10-CM | POA: Diagnosis not present

## 2021-11-20 DIAGNOSIS — Z95828 Presence of other vascular implants and grafts: Secondary | ICD-10-CM

## 2021-11-20 DIAGNOSIS — D4622 Refractory anemia with excess of blasts 2: Secondary | ICD-10-CM | POA: Insufficient documentation

## 2021-11-20 DIAGNOSIS — Z5111 Encounter for antineoplastic chemotherapy: Secondary | ICD-10-CM | POA: Insufficient documentation

## 2021-11-20 DIAGNOSIS — D696 Thrombocytopenia, unspecified: Secondary | ICD-10-CM

## 2021-11-20 DIAGNOSIS — D46Z Other myelodysplastic syndromes: Secondary | ICD-10-CM

## 2021-11-20 LAB — COMPREHENSIVE METABOLIC PANEL
ALT: 13 U/L (ref 0–44)
AST: 16 U/L (ref 15–41)
Albumin: 3.8 g/dL (ref 3.5–5.0)
Alkaline Phosphatase: 66 U/L (ref 38–126)
Anion gap: 6 (ref 5–15)
BUN: 13 mg/dL (ref 8–23)
CO2: 26 mmol/L (ref 22–32)
Calcium: 9.3 mg/dL (ref 8.9–10.3)
Chloride: 108 mmol/L (ref 98–111)
Creatinine, Ser: 0.85 mg/dL (ref 0.61–1.24)
GFR, Estimated: >60 mL/min (ref >60)
Glucose, Bld: 107 mg/dL — ABNORMAL HIGH (ref 70–99)
Potassium: 3.7 mmol/L (ref 3.5–5.1)
Sodium: 140 mmol/L (ref 135–145)
Total Bilirubin: 1.7 mg/dL — ABNORMAL HIGH (ref 0.3–1.2)
Total Protein: 6.2 g/dL — ABNORMAL LOW (ref 6.5–8.1)

## 2021-11-20 LAB — CBC WITH DIFFERENTIAL/PLATELET
Abs Immature Granulocytes: 0 10*3/uL (ref 0.00–0.07)
Basophils Absolute: 0 10*3/uL (ref 0.0–0.1)
Basophils Relative: 0 %
Eosinophils Absolute: 0 10*3/uL (ref 0.0–0.5)
Eosinophils Relative: 0 %
HCT: 34.1 % — ABNORMAL LOW (ref 39.0–52.0)
Hemoglobin: 11.5 g/dL — ABNORMAL LOW (ref 13.0–17.0)
Lymphocytes Relative: 73 %
Lymphs Abs: 1.6 10*3/uL (ref 0.7–4.0)
MCH: 31.7 pg (ref 26.0–34.0)
MCHC: 33.7 g/dL (ref 30.0–36.0)
MCV: 93.9 fL (ref 80.0–100.0)
Monocytes Absolute: 0 10*3/uL — ABNORMAL LOW (ref 0.1–1.0)
Monocytes Relative: 2 %
Neutro Abs: 0.6 10*3/uL — ABNORMAL LOW (ref 1.7–7.7)
Neutrophils Relative %: 25 %
Platelets: 155 10*3/uL (ref 150–400)
RBC: 3.63 MIL/uL — ABNORMAL LOW (ref 4.22–5.81)
RDW: 15.4 % (ref 11.5–15.5)
WBC: 2.2 10*3/uL — ABNORMAL LOW (ref 4.0–10.5)
nRBC: 0 % (ref 0.0–0.2)

## 2021-11-20 LAB — LACTATE DEHYDROGENASE: LDH: 147 U/L (ref 98–192)

## 2021-11-20 LAB — MAGNESIUM: Magnesium: 2.1 mg/dL (ref 1.7–2.4)

## 2021-11-20 MED ORDER — SODIUM CHLORIDE 0.9 % IV SOLN: Freq: Once | INTRAVENOUS | Status: AC

## 2021-11-20 MED ORDER — SODIUM CHLORIDE 0.9 % IV SOLN
10.0000 mg | Freq: Once | INTRAVENOUS | Status: AC
  Administered 2021-11-20: 10 mg via INTRAVENOUS
  Filled 2021-11-20: qty 1

## 2021-11-20 MED ORDER — SODIUM CHLORIDE 0.9% FLUSH
10.0000 mL | INTRAVENOUS | Status: DC | PRN
  Administered 2021-11-20: 10 mL

## 2021-11-20 MED ORDER — PALONOSETRON HCL INJECTION 0.25 MG/5ML
0.2500 mg | Freq: Once | INTRAVENOUS | Status: AC
  Administered 2021-11-20: 0.25 mg via INTRAVENOUS
  Filled 2021-11-20: qty 5

## 2021-11-20 MED ORDER — SODIUM CHLORIDE 0.9 % IV SOLN
75.0000 mg/m2 | Freq: Once | INTRAVENOUS | Status: AC
  Administered 2021-11-20: 140 mg via INTRAVENOUS
  Filled 2021-11-20: qty 14

## 2021-11-20 MED ORDER — HEPARIN SOD (PORK) LOCK FLUSH 100 UNIT/ML IV SOLN
500.0000 [IU] | Freq: Once | INTRAVENOUS | Status: AC | PRN
  Administered 2021-11-20: 500 [IU]

## 2021-11-20 NOTE — Progress Notes (Signed)
[facility], [place] [address]   CLINIC:  Medical Oncology/Hematology  PCP:  [name], [place] / [place] [address] [phone]   REASON FOR VISIT:  Follow-up for MDS with EB 2  PRIOR THERAPY: none  NGS Results: ASXL 1 mutation  CURRENT THERAPY: Azacitidine IV D1-7 q28d (started 06/18/21)  BRIEF ONCOLOGIC HISTORY:  Oncology History  Myelodysplastic syndrome ([facility])  06/17/2021 Initial Diagnosis   Myelodysplastic syndrome ([facility])   07/30/2021 - 10/30/2021 Chemotherapy   Patient is on Treatment Plan : MYELODYSPLASIA  Azacitidine IV D1-7 q28d     07/30/2021 -  Chemotherapy   Patient is on Treatment Plan : MYELODYSPLASIA  Azacitidine IV D1-7 q28d       CANCER STAGING:  Cancer Staging  No matching staging information was found for the patient.  INTERVAL HISTORY:  Mr. HAMEED KOLAR, a 76 y.o. male, seen for follow-up of myelodysplastic syndrome.  Last treatment with azacitidine was 4 weeks ago.  He reported constipation since the start of treatment.  Energy levels are 80%.  He had 1 episode of shortness of breath on exertion while he was walking to his car from the grocery store.   REVIEW OF SYSTEMS:  Review of Systems  Constitutional:  Negative for appetite change and fatigue.  HENT:   Negative for nosebleeds and trouble swallowing.   Respiratory:  Positive for shortness of breath (1 episode over this weekend on exertion). Negative for hemoptysis.   Gastrointestinal:  Positive for constipation. Negative for blood in stool and nausea.  Genitourinary:  Negative for hematuria.   Hematological:  Does not bruise/bleed easily.  Psychiatric/Behavioral:  The patient is nervous/anxious.   All other systems reviewed and are negative.   PAST MEDICAL/SURGICAL HISTORY:  Past Medical History:  Diagnosis Date   Allergy    Anxiety    Arthritis    Asthma    Cataract    Clotting disorder ([facility])    DVT (deep venous thrombosis)  (HCC)    unknown etiology. 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[facility]) 06/17/2021   Pneumonia    Port-A-Cath in place 07/30/2021   Past Surgical History:  Procedure Laterality Date   CATARACT EXTRACTION W/PHACO Right 11/24/2017   Procedure: CATARACT EXTRACTION PHACO AND INTRAOCULAR LENS PLACEMENT RIGHT EYE;  Surgeon: [name], MD;  Location: [facility];  Service: Ophthalmology;  Laterality: Right;  CDE: 8.61   CATARACT EXTRACTION W/PHACO Left 12/29/2017   Procedure: CATARACT EXTRACTION PHACO AND INTRAOCULAR LENS PLACEMENT (IOC);  Surgeon: [name], MD;  Location: [facility];  Service: Ophthalmology;  Laterality: Left;  CDE: 7.21   GANGLION CYST EXCISION     HERNIA REPAIR     bilateral   PARS PLANA VITRECTOMY Left 05/17/2021   Procedure: PARS PLANA VITRECTOMY 25 GAUGE WITH ANTIBIOTIC INJECTION  AND ENDOLASER LEFT EYE;  Surgeon: [name], MD;  Location: [facility];  Service: Ophthalmology;  Laterality: Left;   PORTACATH PLACEMENT Right 07/09/2021   Procedure: INSERTION PORT-A-CATH;  Surgeon: Aviva Signs, MD;  Location: [facility];  Service: General;  Laterality: Right;   TONSILLECTOMY     VASCULAR SURGERY      SOCIAL HISTORY:  Social History   Socioeconomic History   Marital status: Married    Spouse name: [name]   Number of children:  0   Years of education: 72   Highest education level: High school graduate  Occupational History   Not on file  Tobacco Use   Smoking status: Former    Types: Pipe    Quit date: 03/21/1966    Years since quitting: 55.7   Smokeless tobacco: Never  Vaping Use   Vaping Use: Never used  Substance and Sexual Activity   Alcohol use: Yes    Alcohol/week: 1.0 standard drink of alcohol    Types: 1 Cans of beer per week    Comment: 1 can of beer a week   Drug use: Never   Sexual activity: Not Currently  Other Topics Concern    Not on file  Social History Narrative   ** Merged History Encounter **       Grew up in [place], [place] and [place].  Retired.  Worked in [employer]. 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    Social Determinants of Health   Financial Resource Strain: Low Risk  (01/23/2017)   Overall Financial Resource Strain (CARDIA)    Difficulty of Paying Living Expenses: Not hard at all  Food Insecurity: No Food Insecurity (01/23/2017)   Hunger Vital Sign    Worried About Running Out of Food in the Last Year: Never true    Ran Out of Food in the Last Year: Never true  Transportation Needs: No Transportation Needs (01/23/2017)   PRAPARE - Hydrologist (Medical): No    Lack of Transportation (Non-Medical): No  Physical Activity: Unknown (01/23/2017)   Exercise Vital Sign    Days of Exercise per Week: 3 days    Minutes of Exercise per Session: Not on file  Stress: Stress Concern Present (01/23/2017)   [place]    Feeling of Stress : To some extent  Social Connections: Somewhat Isolated (01/23/2017)   Social Connection and Isolation Panel [NHANES]    Frequency of Communication with Friends and Family: More than three times a week    Frequency of Social Gatherings with Friends and Family: More than three times a week    Attends Religious Services: Never    Marine scientist or Organizations: No    Attends Archivist Meetings: Never    Marital Status: Married  Human resources officer Violence: Not At Risk (01/23/2017)   Humiliation, Afraid, Rape, and Kick questionnaire    Fear of Current or Ex-Partner: No    Emotionally Abused: No    Physically Abused: No    Sexually Abused: No    FAMILY HISTORY:  Family History  Problem Relation Age of Onset   Cancer Mother        pancreatic   Arthritis Father    Early death Father 62       Lung infiltrate    Colon cancer Neg Hx    Colon polyps Neg Hx     CURRENT MEDICATIONS:  Current Outpatient Medications  Medication Sig Dispense Refill   albuterol (PROVENTIL HFA;VENTOLIN HFA) 108 (90 Base) MCG/ACT inhaler Inhale 1-2 puffs into the lungs every 6 (six) hours as needed for wheezing or shortness of breath.     ALPRAZolam (XANAX) 0.5 MG tablet Take 0.5 mg by mouth 2 (two) times daily as needed for anxiety.     amLODipine (NORVASC) 2.5 MG tablet Take 2.5 mg by mouth daily.     azaCITIDine 5 mg/2 mLs in lactated  ringers infusion Inject into the vein daily. Days 1-7 every 28 days     fluticasone furoate-vilanterol (BREO ELLIPTA) 200-25 MCG/ACT AEPB Inhale 1 puff into the lungs daily.     Lidocaine-Hydrocort, Perianal, 3-0.5 % CREA Apply topically 2 (two) times daily as needed.     Lidocaine-Hydrocortisone Ace 3-0.5 % CREA Apply 1 application topically as directed. (Patient taking differently: Apply 1 application  topically 2 (two) times daily as needed (hemorrhoids).) 30 Tube 11   loratadine (CLARITIN) 10 MG tablet Take 10 mg by mouth daily.     Lutein 40 MG CAPS Take 40 mg by mouth daily.     Multiple Vitamins-Minerals (CENTRUM ADULTS PO) Take 1 tablet by mouth daily.     nystatin cream (MYCOSTATIN) Apply 1 application. topically 2 (two) times daily as needed for dry skin.     ofloxacin (OCUFLOX) 0.3 % ophthalmic solution Place 1 drop into the left eye See admin instructions. 1 drop to left eye 4 x daily for 7 days after monthly procedure.     omeprazole (PRILOSEC OTC) 20 MG tablet Take 5-10 mg by mouth daily.     sildenafil (VIAGRA) 50 MG tablet Take 50 mg by mouth daily as needed for erectile dysfunction.     traMADol (ULTRAM) 50 MG tablet Take 1 tablet (50 mg total) by mouth every 6 (six) hours as needed. 15 tablet 0   No current facility-administered medications for this visit.    ALLERGIES:  Allergies  Allergen Reactions   Cephalosporins Itching   Amoxapine And Related Itching and Rash     Has patient had a PCN reaction causing immediate rash, facial/tongue/throat swelling, SOB or lightheadedness with hypotension: No Has patient had a PCN reaction causing severe rash involving mucus membranes or skin necrosis: No Has patient had a PCN reaction that required hospitalization: No Has patient had a PCN reaction occurring within the last 10 years: No If all of the above answers are "NO", then may proceed with Cephalosporin use.     Amoxicillin Rash   Cephalexin Rash   Clindamycin/Lincomycin Itching and Rash   Sulfamethoxazole Itching and Rash   Trimethoprim Rash    PHYSICAL EXAM:  Performance status (ECOG): 1 - Symptomatic but completely ambulatory  There were no vitals filed for this visit. Wt Readings from Last 3 Encounters:  11/20/21 158 lb 3.2 oz (71.8 kg)  10/29/21 154 lb 9.6 oz (70.1 kg)  10/22/21 156 lb 12.8 oz (71.1 kg)   Physical Exam Vitals reviewed.  Constitutional:      Appearance: Normal appearance.  Cardiovascular:     Heart sounds: Normal heart sounds.  Pulmonary:     Effort: Pulmonary effort is normal.     Breath sounds: Normal breath sounds.  Neurological:     Mental Status: He is alert and oriented to person, place, and time.  Psychiatric:        Mood and Affect: Mood normal.        Behavior: Behavior normal.     LABORATORY DATA:  I have reviewed the labs as listed.     Latest Ref Rng & Units 11/20/2021    9:34 AM 10/22/2021   12:27 PM 10/16/2021    2:57 PM  CBC  WBC 4.0 - 10.5 K/uL 2.2  1.9  1.6   Hemoglobin 13.0 - 17.0 g/dL 11.5  10.9  11.1   Hematocrit 39.0 - 52.0 % 34.1  32.6  32.9   Platelets 150 - 400 K/uL 155  153  126  Latest Ref Rng & Units 11/20/2021    9:33 AM 10/22/2021   12:27 PM 10/16/2021    2:57 PM  CMP  Glucose 70 - 99 mg/dL 107  105  103   BUN 8 - 23 mg/dL _0 Creatinine 0.61 - 1.24 mg/dL 0.85  0.87  0.87   Sodium 135 - 145 mmol/L 140  137  140   Potassium 3.5 - 5.1 mmol/L 3.7  3.5  3.8   Chloride 98 - 111  mmol/L 108  108  109   CO2 22 - 32 mmol/L _1 Calcium 8.9 - 10.3 mg/dL 9.3  9.1  9.3   Total Protein 6.5 - 8.1 g/dL 6.2  6.3  6.2   Total Bilirubin 0.3 - 1.2 mg/dL 1.7  1.3  1.5   Alkaline Phos 38 - 126 U/L 66  62  62   AST 15 - 41 U/L _2 ALT 0 - 44 U/L _3 DIAGNOSTIC IMAGING:  I have independently reviewed the scans and discussed with the patient. No results found.   ASSESSMENT:  MDS with EB 2: - Seen at the request of Dr. Mannie Stabile for abnormal CBC. - Labs on 12/06/2020 shows white count 3.0 with 41% neutrophils, 41% lymphocytes, 13.8% monocytes.  ANC was 1.2.  Hemoglobin 13.4 and normal.  MCV was slightly high at 96.  Platelet count was 128.  Vitamin N62 and folic acid were normal. - He took antibiotics for teeth implants few times this year, last 1 on 11/16/2020.  He does not know the antibiotic name.  He also reports taking over-the-counter pill for memory for 5 to 6 months, which was stopped around September 2022. - Labs from 07/07/2020 with creatinine 0.92.  White count 3.3 (37% neutrophils, 45% lymphocytes, 14% monocytes, 3% eosinophils), ANC 1.2.  Platelet count 149. - His prior CBC from 12/08/2017 in our system was completely normal. - Reports slight worsening of the night sweats in the last 3 months.  At least once per week and has to change clothes.  No fevers or weight loss. - BMBX on 06/04/2021: Hypercellular marrow with dyspoietic changes involving the granulocytic cell line and megakaryocytes.  Myeloblasts 8% on aspirate smears and 10% by flow.  Cytogenetics 32, XY (20).  MDS FISH panel normal. 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 - IPSS-M score of 0.12, moderate high risk.  Leukemia free survival is 2.3 years.  Overall survival 2.8 years. - Cycle 1 of azacitidine on 07/30/2021   Social/family history: - He lives at home with his wife.  He swims about 40 minutes daily. 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  Non-smoker. - Paternal aunt had breast cancer and mother had pancreatic cancer.  3.  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 - He underwent thrombolytic therapy at [facility]. - He will followed up with vascular at [facility] and has been on Xarelto since then.   PLAN:  MDS with EB 2: - He has tolerated cycle 4 at full dose for 7 days very well.  No major side effects noted. - He had 1 episode of shortness of breath on exertion over the weekend when he was walking to his car from the grocery store. - Reviewed labs today which  showed hemoglobin is 11.5.  Platelet count normalized since the start of treatment.  White count is 2.2 with differential pending.  LFTs and creatinine are normal.  LDH was normal. - Recommend proceeding with cycle 5 today without any dose modifications.  RTC 4 weeks for follow-up and cycle 6.  We will do bone marrow biopsy after 6 cycles.   Unprovoked left subclavian DVT: - Xarelto discontinued due to hemorrhoidal bleeding.  3.  Constipation:       -From [place]ox[place].  Continue Colace and prune juice.   Orders placed this encounter:  No orders of the defined types were placed in this encounter.    [name], MD [facility] [phone]

## 2021-11-20 NOTE — Patient Instructions (Signed)
[facility]  Discharge Instructions: Thank you for choosing [facility] to provide your oncology and hematology care.  If you have a lab appointment with the [facility], please come in thru the Main Entrance and check in at the main information desk.  Wear comfortable clothing and clothing appropriate for easy access to any Portacath or PICC line.   We strive to give you quality time with your provider. You may need to reschedule your appointment if you arrive late (15 or more minutes).  Arriving late affects you and other patients whose appointments are after yours.  Also, if you miss three or more appointments without notifying the office, you may be dismissed from the clinic at the provider's discretion.      For prescription refill requests, have your pharmacy contact our office and allow 72 hours for refills to be completed.    Today you received the following chemotherapy and/or immunotherapy agents Vidaza.   To help prevent nausea and vomiting after your treatment, we encourage you to take your nausea medication as directed.  BELOW ARE SYMPTOMS THAT SHOULD BE REPORTED IMMEDIATELY: *FEVER GREATER THAN 100.4 F (38 C) OR HIGHER *CHILLS OR SWEATING *NAUSEA AND VOMITING THAT IS NOT CONTROLLED WITH YOUR NAUSEA MEDICATION *UNUSUAL SHORTNESS OF BREATH *UNUSUAL BRUISING OR BLEEDING *URINARY PROBLEMS (pain or burning when urinating, or frequent urination) *BOWEL PROBLEMS (unusual diarrhea, constipation, pain near the anus) TENDERNESS IN MOUTH AND THROAT WITH OR WITHOUT PRESENCE OF ULCERS (sore throat, sores in mouth, or a toothache) UNUSUAL RASH, SWELLING OR PAIN  UNUSUAL VAGINAL DISCHARGE OR ITCHING   Items with * indicate a potential emergency and should be followed up as soon as possible or go to the Emergency Department if any problems should occur.  Please show the CHEMOTHERAPY ALERT CARD or IMMUNOTHERAPY ALERT CARD at check-in to the Emergency  Department and triage nurse.  Should you have questions after your visit or need to cancel or reschedule your appointment, please contact [facility] [phone]  and follow the prompts.  Office hours are 8:00 a.m. to 4:30 p.m. Monday - Friday. Please note that voicemails left after 4:00 p.m. may not be returned until the following business day.  We are closed weekends and major holidays. You have access to a nurse at all times for urgent questions. Please call the main number to the clinic [phone] and follow the prompts.  For any non-urgent questions, you may also contact your provider using MyChart. We now offer e-Visits for anyone 75 and older to request care online for non-urgent symptoms. For details visit [email or web address].   Also download the MyChart app! Go to the app store, search "MyChart", open the app, select [facility], and log in with your MyChart username and password.  Masks are optional in the cancer centers. If you would like for your care team to wear a mask while they are taking care of you, please let them know. You may have one support person who is at least 77 years old accompany you for your appointments.  Azacitidine Injection What is this medication? AZACITIDINE (ay [place]) treats blood and bone marrow cancers. It works by slowing down the growth of cancer cells. This medicine may be used for other purposes; ask your health care provider or pharmacist if you have questions. COMMON BRAND NAME(S): Vidaza What should I tell my care team before I take this medication? They need to know if you have any  of these conditions: Kidney disease Liver disease Low blood cell levels, such as low white cells, platelets, or red blood cells Low levels of albumin in the blood Low levels of bicarbonate in the blood An unusual or allergic reaction to azacitidine, mannitol, other medications, foods, dyes, or preservatives If you or your partner are  pregnant or trying to get pregnant Breast-feeding How should I use this medication? This medication is injected into a vein or under the skin. It is given by your care team in a hospital or clinic setting. Talk to your care team about the use of this medication in children. While it may be prescribed for children as young as 1 month for selected conditions, precautions do apply. Overdosage: If you think you have taken too much of this medicine contact a poison control center or emergency room at once. NOTE: This medicine is only for you. Do not share this medicine with others. What if I miss a dose? Keep appointments for follow-up doses. It is important not to miss your dose. Call your care team if you are unable to keep an appointment. What may interact with this medication? Interactions are not expected. This list may not describe all possible interactions. Give your health care provider a list of all the medicines, herbs, non-prescription drugs, or dietary supplements you use. Also tell them if you smoke, drink alcohol, or use illegal drugs. Some items may interact with your medicine. What should I watch for while using this medication? Your condition will be monitored carefully while you are receiving this medication. You may need blood work while taking this medication. This medication may make you feel generally unwell. This is not uncommon as chemotherapy can affect healthy cells as well as cancer cells. Report any side effects. Continue your course of treatment even though you feel ill unless your care team tells you to stop. Other product types may be available that contain the medication azacitidine. The injection and oral products should not be used in place of one another. Talk to your care team if you have questions. This medication can cause serious side effects. To reduce the risk, your care team may give you other medications to take before receiving this one. Be sure to follow the  directions from your care team. This medication may increase your risk of getting an infection. Call your care team for advice if you get a fever, chills, sore throat, or other symptoms of a cold or flu. Do not treat yourself. Try to avoid being around people who are sick. Avoid taking medications that contain aspirin, acetaminophen, ibuprofen, naproxen, or ketoprofen unless instructed by your care team. These medications may hide a fever. This medication may increase your risk to bruise or bleed. Call your care team if you notice any unusual bleeding. Be careful brushing or flossing your teeth or using a toothpick because you may get an infection or bleed more easily. If you have any dental work done, tell your dentist you are receiving this medication. Talk to your care team if you or your partner may be pregnant. Serious birth defects can occur if you take this medication during pregnancy and for 6 months after the last dose. You will need a negative pregnancy test before starting this medication. Contraception is recommended while taking his medication and for 6 months after the last dose. Your care team can help you find the option that works for you. If your partner can get pregnant, use a condom during sex while  taking this medication and for 3 months after the last dose. Do not breastfeed while taking this medication and for 1 week after the last dose. This medication may cause infertility. Talk to your care team if you are concerned about your fertility. What side effects may I notice from receiving this medication? Side effects that you should report to your care team as soon as possible: Allergic reactions--skin rash, itching, hives, swelling of the face, lips, tongue, or throat Infection--fever, chills, cough, sore throat, wounds that don't heal, pain or trouble when passing urine, general feeling of discomfort or being unwell Kidney injury--decrease in the amount of urine, swelling of the  ankles, hands, or feet Liver injury--right upper belly pain, loss of appetite, nausea, light-colored stool, dark yellow or brown urine, yellowing skin or eyes, unusual weakness or fatigue Low red blood cell level--unusual weakness or fatigue, dizziness, headache, trouble breathing Tumor lysis syndrome (TLS)--nausea, vomiting, diarrhea, decrease in the amount of urine, dark urine, unusual weakness or fatigue, confusion, muscle pain or cramps, fast or irregular heartbeat, joint pain Unusual bruising or bleeding Side effects that usually do not require medical attention (report to your care team if they continue or are bothersome): Constipation Diarrhea Nausea Pain, redness, or irritation at injection site Vomiting This list may not describe all possible side effects. Call your doctor for medical advice about side effects. You may report side effects to FDA at 1-800-FDA-1088. Where should I keep my medication? This medication is given in a hospital or clinic. It will not be stored at home. NOTE: This sheet is a summary. It may not cover all possible information. If you have questions about this medicine, talk to your doctor, pharmacist, or health care provider.  2023 Elsevier/Gold Standard (2021-07-19 00:00:00)

## 2021-11-20 NOTE — Progress Notes (Signed)
Pt presents today for D1 Vidaza per provider's order. Vital signs and other labs WNL for treatment. Pt's bilirubin is 1.7 and ANC is 0.6 today, Dr.K aware. Okay to proceed with treatment today per Dr.K.  D1 Vidaza given today per MD orders. Tolerated infusion without adverse affects. Vital signs stable. No complaints at this time. Discharged from clinic ambulatory in stable condition. Alert and oriented x 3. F/U with [facility] as scheduled.

## 2021-11-20 NOTE — Patient Instructions (Signed)
[facility] at [facility] Discharge Instructions   You were seen and examined today by Dr. Delton Coombes.  He reviewed the results of part of your lab work which are normal/stable. Your blood cell counts, hemoglobin, and platelets results are pending.   We will proceed with your treatment today.  Return as scheduled.    Thank you for choosing [facility] at [facility] to provide your oncology and hematology care.  To afford each patient quality time with our provider, please arrive at least 15 minutes before your scheduled appointment time.   If you have a lab appointment with the [facility] please come in thru the Main Entrance and check in at the main information desk.  You need to re-schedule your appointment should you arrive 10 or more minutes late.  We strive to give you quality time with our providers, and arriving late affects you and other patients whose appointments are after yours.  Also, if you no show three or more times for appointments you may be dismissed from the clinic at the providers discretion.     Again, thank you for choosing [facility].  Our hope is that these requests will decrease the amount of time that you wait before being seen by our physicians.       _____________________________________________________________  Should you have questions after your visit to [facility], please contact our office at [phone] and follow the prompts.  Our office hours are 8:00 a.m. and 4:30 p.m. Monday - Friday.  Please note that voicemails left after 4:00 p.m. may not be returned until the following business day.  We are closed weekends and major holidays.  You do have access to a nurse 24-7, just call the main number to the clinic [phone] and do not press any options, hold on the line and a nurse will answer the phone.    For prescription refill requests, have your pharmacy contact our office and  allow 72 hours.    Due to Covid, you will need to wear a mask upon entering the hospital. If you do not have a mask, a mask will be given to you at the Main Entrance upon arrival. For doctor visits, patients may have 1 support person age 35 or older with them. For treatment visits, patients can not have anyone with them due to social distancing guidelines and our immunocompromised population.

## 2021-11-21 ENCOUNTER — Other Ambulatory Visit: Payer: Self-pay

## 2021-11-21 ENCOUNTER — Inpatient Hospital Stay: Payer: Medicare HMO

## 2021-11-21 VITALS — BP 133/77 | HR 78 | Temp 97.9°F | Resp 18

## 2021-11-21 DIAGNOSIS — Z95828 Presence of other vascular implants and grafts: Secondary | ICD-10-CM

## 2021-11-21 DIAGNOSIS — D469 Myelodysplastic syndrome, unspecified: Secondary | ICD-10-CM

## 2021-11-21 DIAGNOSIS — Z5111 Encounter for antineoplastic chemotherapy: Secondary | ICD-10-CM | POA: Diagnosis not present

## 2021-11-21 MED ORDER — SODIUM CHLORIDE 0.9% FLUSH
10.0000 mL | INTRAVENOUS | Status: DC | PRN
  Administered 2021-11-21: 10 mL

## 2021-11-21 MED ORDER — SODIUM CHLORIDE 0.9 % IV SOLN
10.0000 mg | Freq: Once | INTRAVENOUS | Status: AC
  Administered 2021-11-21: 10 mg via INTRAVENOUS
  Filled 2021-11-21: qty 1

## 2021-11-21 MED ORDER — SODIUM CHLORIDE 0.9 % IV SOLN: Freq: Once | INTRAVENOUS | Status: AC

## 2021-11-21 MED ORDER — HEPARIN SOD (PORK) LOCK FLUSH 100 UNIT/ML IV SOLN
500.0000 [IU] | Freq: Once | INTRAVENOUS | Status: AC | PRN
  Administered 2021-11-21: 500 [IU]

## 2021-11-21 MED ORDER — SODIUM CHLORIDE 0.9 % IV SOLN
75.0000 mg/m2 | Freq: Once | INTRAVENOUS | Status: AC
  Administered 2021-11-21: 140 mg via INTRAVENOUS
  Filled 2021-11-21: qty 14

## 2021-11-21 NOTE — Progress Notes (Signed)
Patient presents today for Vidaza infusion.  Patient is in satisfactory condition with no new complaints voiced.  Vital signs are stable.  We will proceed with treatment per MD orders.  Patient tolerated treatment well with no complaints voiced.  Patient left ambulatory in stable condition.  Vital signs stable at discharge.  Follow up as scheduled.

## 2021-11-21 NOTE — Patient Instructions (Signed)
[facility]  Discharge Instructions: Thank you for choosing [facility] to provide your oncology and hematology care.  If you have a lab appointment with the [facility], please come in thru the Main Entrance and check in at the main information desk.  Wear comfortable clothing and clothing appropriate for easy access to any Portacath or PICC line.   We strive to give you quality time with your provider. You may need to reschedule your appointment if you arrive late (15 or more minutes).  Arriving late affects you and other patients whose appointments are after yours.  Also, if you miss three or more appointments without notifying the office, you may be dismissed from the clinic at the provider's discretion.      For prescription refill requests, have your pharmacy contact our office and allow 72 hours for refills to be completed.    Today you received the following chemotherapy and/or immunotherapy agents Vidaza.  Azacitidine Injection What is this medication? AZACITIDINE (ay [employer]) treats blood and bone marrow cancers. It works by slowing down the growth of cancer cells. This medicine may be used for other purposes; ask your health care provider or pharmacist if you have questions. COMMON BRAND NAME(S): Vidaza What should I tell my care team before I take this medication? They need to know if you have any of these conditions: Kidney disease Liver disease Low blood cell levels, such as low white cells, platelets, or red blood cells Low levels of albumin in the blood Low levels of bicarbonate in the blood An unusual or allergic reaction to azacitidine, mannitol, other medications, foods, dyes, or preservatives If you or your partner are pregnant or trying to get pregnant Breast-feeding How should I use this medication? This medication is injected into a vein or under the skin. It is given by your care team in a hospital or clinic  setting. Talk to your care team about the use of this medication in children. While it may be prescribed for children as young as 1 month for selected conditions, precautions do apply. Overdosage: If you think you have taken too much of this medicine contact a poison control center or emergency room at once. NOTE: This medicine is only for you. Do not share this medicine with others. What if I miss a dose? Keep appointments for follow-up doses. It is important not to miss your dose. Call your care team if you are unable to keep an appointment. What may interact with this medication? Interactions are not expected. This list may not describe all possible interactions. Give your health care provider a list of all the medicines, herbs, non-prescription drugs, or dietary supplements you use. Also tell them if you smoke, drink alcohol, or use illegal drugs. Some items may interact with your medicine. What should I watch for while using this medication? Your condition will be monitored carefully while you are receiving this medication. You may need blood work while taking this medication. This medication may make you feel generally unwell. This is not uncommon as chemotherapy can affect healthy cells as well as cancer cells. Report any side effects. Continue your course of treatment even though you feel ill unless your care team tells you to stop. Other product types may be available that contain the medication azacitidine. The injection and oral products should not be used in place of one another. Talk to your care team if you have questions. This medication can cause serious side  effects. To reduce the risk, your care team may give you other medications to take before receiving this one. Be sure to follow the directions from your care team. This medication may increase your risk of getting an infection. Call your care team for advice if you get a fever, chills, sore throat, or other symptoms of a cold or  flu. Do not treat yourself. Try to avoid being around people who are sick. Avoid taking medications that contain aspirin, acetaminophen, ibuprofen, naproxen, or ketoprofen unless instructed by your care team. These medications may hide a fever. This medication may increase your risk to bruise or bleed. Call your care team if you notice any unusual bleeding. Be careful brushing or flossing your teeth or using a toothpick because you may get an infection or bleed more easily. If you have any dental work done, tell your dentist you are receiving this medication. Talk to your care team if you or your partner may be pregnant. Serious birth defects can occur if you take this medication during pregnancy and for 6 months after the last dose. You will need a negative pregnancy test before starting this medication. Contraception is recommended while taking his medication and for 6 months after the last dose. Your care team can help you find the option that works for you. If your partner can get pregnant, use a condom during sex while taking this medication and for 3 months after the last dose. Do not breastfeed while taking this medication and for 1 week after the last dose. This medication may cause infertility. Talk to your care team if you are concerned about your fertility. What side effects may I notice from receiving this medication? Side effects that you should report to your care team as soon as possible: Allergic reactions--skin rash, itching, hives, swelling of the face, lips, tongue, or throat Infection--fever, chills, cough, sore throat, wounds that don't heal, pain or trouble when passing urine, general feeling of discomfort or being unwell Kidney injury--decrease in the amount of urine, swelling of the ankles, hands, or feet Liver injury--right upper belly pain, loss of appetite, nausea, light-colored stool, dark yellow or Cailynn Bodnar urine, yellowing skin or eyes, unusual weakness or fatigue Low red  blood cell level--unusual weakness or fatigue, dizziness, headache, trouble breathing Tumor lysis syndrome (TLS)--nausea, vomiting, diarrhea, decrease in the amount of urine, dark urine, unusual weakness or fatigue, confusion, muscle pain or cramps, fast or irregular heartbeat, joint pain Unusual bruising or bleeding Side effects that usually do not require medical attention (report to your care team if they continue or are bothersome): Constipation Diarrhea Nausea Pain, redness, or irritation at injection site Vomiting This list may not describe all possible side effects. Call your doctor for medical advice about side effects. You may report side effects to FDA at 1-800-FDA-1088. Where should I keep my medication? This medication is given in a hospital or clinic. It will not be stored at home. NOTE: This sheet is a summary. It may not cover all possible information. If you have questions about this medicine, talk to your doctor, pharmacist, or health care provider.  2023 Elsevier/Gold Standard (2021-07-19 00:00:00)        To help prevent nausea and vomiting after your treatment, we encourage you to take your nausea medication as directed.  BELOW ARE SYMPTOMS THAT SHOULD BE REPORTED IMMEDIATELY: *FEVER GREATER THAN 100.4 F (38 C) OR HIGHER *CHILLS OR SWEATING *NAUSEA AND VOMITING THAT IS NOT CONTROLLED WITH YOUR NAUSEA MEDICATION *UNUSUAL SHORTNESS OF  BREATH *UNUSUAL BRUISING OR BLEEDING *URINARY PROBLEMS (pain or burning when urinating, or frequent urination) *BOWEL PROBLEMS (unusual diarrhea, constipation, pain near the anus) TENDERNESS IN MOUTH AND THROAT WITH OR WITHOUT PRESENCE OF ULCERS (sore throat, sores in mouth, or a toothache) UNUSUAL RASH, SWELLING OR PAIN  UNUSUAL VAGINAL DISCHARGE OR ITCHING   Items with * indicate a potential emergency and should be followed up as soon as possible or go to the Emergency Department if any problems should occur.  Please show the  CHEMOTHERAPY ALERT CARD or IMMUNOTHERAPY ALERT CARD at check-in to the Emergency Department and triage nurse.  Should you have questions after your visit or need to cancel or reschedule your appointment, please contact [facility] [phone]  and follow the prompts.  Office hours are 8:00 a.m. to 4:30 p.m. Monday - Friday. Please note that voicemails left after 4:00 p.m. may not be returned until the following business day.  We are closed weekends and major holidays. You have access to a nurse at all times for urgent questions. Please call the main number to the clinic [phone] and follow the prompts.  For any non-urgent questions, you may also contact your provider using MyChart. We now offer e-Visits for anyone 24 and older to request care online for non-urgent symptoms. For details visit [email or web address].   Also download the MyChart app! Go to the app store, search "MyChart", open the app, select [facility], and log in with your MyChart username and password.  Masks are optional in the cancer centers. If you would like for your care team to wear a mask while they are taking care of you, please let them know. You may have one support person who is at least 77 years old accompany you for your appointments.

## 2021-11-22 ENCOUNTER — Inpatient Hospital Stay: Payer: Medicare HMO

## 2021-11-22 VITALS — BP 120/74 | HR 60 | Temp 97.0°F | Resp 18

## 2021-11-22 DIAGNOSIS — D469 Myelodysplastic syndrome, unspecified: Secondary | ICD-10-CM

## 2021-11-22 DIAGNOSIS — Z95828 Presence of other vascular implants and grafts: Secondary | ICD-10-CM

## 2021-11-22 DIAGNOSIS — Z5111 Encounter for antineoplastic chemotherapy: Secondary | ICD-10-CM | POA: Diagnosis not present

## 2021-11-22 MED ORDER — SODIUM CHLORIDE 0.9% FLUSH
10.0000 mL | INTRAVENOUS | Status: DC | PRN
  Administered 2021-11-22: 10 mL

## 2021-11-22 MED ORDER — SODIUM CHLORIDE 0.9 % IV SOLN: Freq: Once | INTRAVENOUS | Status: AC

## 2021-11-22 MED ORDER — PALONOSETRON HCL INJECTION 0.25 MG/5ML
0.2500 mg | Freq: Once | INTRAVENOUS | Status: AC
  Administered 2021-11-22: 0.25 mg via INTRAVENOUS
  Filled 2021-11-22: qty 5

## 2021-11-22 MED ORDER — SODIUM CHLORIDE 0.9 % IV SOLN
10.0000 mg | Freq: Once | INTRAVENOUS | Status: AC
  Administered 2021-11-22: 10 mg via INTRAVENOUS
  Filled 2021-11-22: qty 10

## 2021-11-22 MED ORDER — SODIUM CHLORIDE 0.9 % IV SOLN
75.0000 mg/m2 | Freq: Once | INTRAVENOUS | Status: AC
  Administered 2021-11-22: 140 mg via INTRAVENOUS
  Filled 2021-11-22: qty 14

## 2021-11-22 MED ORDER — HEPARIN SOD (PORK) LOCK FLUSH 100 UNIT/ML IV SOLN
500.0000 [IU] | Freq: Once | INTRAVENOUS | Status: AC | PRN
  Administered 2021-11-22: 500 [IU]

## 2021-11-22 NOTE — Progress Notes (Signed)
Vidaza given today per MD orders.  Stable during infusion without adverse affects.  Vital signs stable.  No complaints at this time.  Discharge from clinic ambulatory in stable condition.  Alert and oriented X 3.  Follow up with [facility] as scheduled.

## 2021-11-22 NOTE — Patient Instructions (Signed)
[place]  Discharge Instructions: Thank you for choosing [facility] to provide your oncology and hematology care.  If you have a lab appointment with the [facility], please come in thru the Main Entrance and check in at the main information desk.  Wear comfortable clothing and clothing appropriate for easy access to any Portacath or PICC line.   We strive to give you quality time with your provider. You may need to reschedule your appointment if you arrive late (15 or more minutes).  Arriving late affects you and other patients whose appointments are after yours.  Also, if you miss three or more appointments without notifying the office, you may be dismissed from the clinic at the provider's discretion.      For prescription refill requests, have your pharmacy contact our office and allow 72 hours for refills to be completed.    Today you received the following chemotherapy and/or immunotherapy agents Vidaza      To help prevent nausea and vomiting after your treatment, we encourage you to take your nausea medication as directed.  BELOW ARE SYMPTOMS THAT SHOULD BE REPORTED IMMEDIATELY: *FEVER GREATER THAN 100.4 F (38 C) OR HIGHER *CHILLS OR SWEATING *NAUSEA AND VOMITING THAT IS NOT CONTROLLED WITH YOUR NAUSEA MEDICATION *UNUSUAL SHORTNESS OF BREATH *UNUSUAL BRUISING OR BLEEDING *URINARY PROBLEMS (pain or burning when urinating, or frequent urination) *BOWEL PROBLEMS (unusual diarrhea, constipation, pain near the anus) TENDERNESS IN MOUTH AND THROAT WITH OR WITHOUT PRESENCE OF ULCERS (sore throat, sores in mouth, or a toothache) UNUSUAL RASH, SWELLING OR PAIN  UNUSUAL VAGINAL DISCHARGE OR ITCHING   Items with * indicate a potential emergency and should be followed up as soon as possible or go to the Emergency Department if any problems should occur.  Please show the CHEMOTHERAPY ALERT CARD or IMMUNOTHERAPY ALERT CARD at check-in to the Emergency  Department and triage nurse.  Should you have questions after your visit or need to cancel or reschedule your appointment, please contact [facility] [phone]  and follow the prompts.  Office hours are 8:00 a.m. to 4:30 p.m. Monday - Friday. Please note that voicemails left after 4:00 p.m. may not be returned until the following business day.  We are closed weekends and major holidays. You have access to a nurse at all times for urgent questions. Please call the main number to the clinic [phone] and follow the prompts.  For any non-urgent questions, you may also contact your provider using MyChart. We now offer e-Visits for anyone 6 and older to request care online for non-urgent symptoms. For details visit [email or web address].   Also download the MyChart app! Go to the app store, search "MyChart", open the app, select [facility], and log in with your MyChart username and password.  Masks are optional in the cancer centers. If you would like for your care team to wear a mask while they are taking care of you, please let them know. You may have one support person who is at least 77 years old accompany you for your appointments.

## 2021-11-23 ENCOUNTER — Inpatient Hospital Stay: Payer: Medicare HMO

## 2021-11-23 VITALS — BP 117/78 | HR 64 | Temp 97.1°F | Resp 18

## 2021-11-23 DIAGNOSIS — Z5111 Encounter for antineoplastic chemotherapy: Secondary | ICD-10-CM | POA: Diagnosis not present

## 2021-11-23 DIAGNOSIS — Z95828 Presence of other vascular implants and grafts: Secondary | ICD-10-CM

## 2021-11-23 DIAGNOSIS — D469 Myelodysplastic syndrome, unspecified: Secondary | ICD-10-CM

## 2021-11-23 MED ORDER — SODIUM CHLORIDE 0.9 % IV SOLN
10.0000 mg | Freq: Once | INTRAVENOUS | Status: AC
  Administered 2021-11-23: 10 mg via INTRAVENOUS
  Filled 2021-11-23: qty 10

## 2021-11-23 MED ORDER — HEPARIN SOD (PORK) LOCK FLUSH 100 UNIT/ML IV SOLN
500.0000 [IU] | Freq: Once | INTRAVENOUS | Status: AC | PRN
  Administered 2021-11-23: 500 [IU]

## 2021-11-23 MED ORDER — SODIUM CHLORIDE 0.9 % IV SOLN: Freq: Once | INTRAVENOUS | Status: AC

## 2021-11-23 MED ORDER — SODIUM CHLORIDE 0.9 % IV SOLN
75.0000 mg/m2 | Freq: Once | INTRAVENOUS | Status: AC
  Administered 2021-11-23: 140 mg via INTRAVENOUS
  Filled 2021-11-23: qty 14

## 2021-11-23 MED ORDER — SODIUM CHLORIDE 0.9% FLUSH
10.0000 mL | INTRAVENOUS | Status: DC | PRN
  Administered 2021-11-23: 10 mL

## 2021-11-23 NOTE — Patient Instructions (Signed)
[facility]  Discharge Instructions: Thank you for choosing [facility] to provide your oncology and hematology care.  If you have a lab appointment with the [facility], please come in thru the Main Entrance and check in at the main information desk.  Wear comfortable clothing and clothing appropriate for easy access to any Portacath or PICC line.   We strive to give you quality time with your provider. You may need to reschedule your appointment if you arrive late (15 or more minutes).  Arriving late affects you and other patients whose appointments are after yours.  Also, if you miss three or more appointments without notifying the office, you may be dismissed from the clinic at the provider's discretion.      For prescription refill requests, have your pharmacy contact our office and allow 72 hours for refills to be completed.    Today you received the following chemotherapy and/or immunotherapy agents Vidaza, return as scheduled.   To help prevent nausea and vomiting after your treatment, we encourage you to take your nausea medication as directed.  BELOW ARE SYMPTOMS THAT SHOULD BE REPORTED IMMEDIATELY: *FEVER GREATER THAN 100.4 F (38 C) OR HIGHER *CHILLS OR SWEATING *NAUSEA AND VOMITING THAT IS NOT CONTROLLED WITH YOUR NAUSEA MEDICATION *UNUSUAL SHORTNESS OF BREATH *UNUSUAL BRUISING OR BLEEDING *URINARY PROBLEMS (pain or burning when urinating, or frequent urination) *BOWEL PROBLEMS (unusual diarrhea, constipation, pain near the anus) TENDERNESS IN MOUTH AND THROAT WITH OR WITHOUT PRESENCE OF ULCERS (sore throat, sores in mouth, or a toothache) UNUSUAL RASH, SWELLING OR PAIN  UNUSUAL VAGINAL DISCHARGE OR ITCHING   Items with * indicate a potential emergency and should be followed up as soon as possible or go to the Emergency Department if any problems should occur.  Please show the CHEMOTHERAPY ALERT CARD or IMMUNOTHERAPY ALERT CARD at  check-in to the Emergency Department and triage nurse.  Should you have questions after your visit or need to cancel or reschedule your appointment, please contact [facility] [phone]  and follow the prompts.  Office hours are 8:00 a.m. to 4:30 p.m. Monday - Friday. Please note that voicemails left after 4:00 p.m. may not be returned until the following business day.  We are closed weekends and major holidays. You have access to a nurse at all times for urgent questions. Please call the main number to the clinic [phone] and follow the prompts.  For any non-urgent questions, you may also contact your provider using MyChart. We now offer e-Visits for anyone 16 and older to request care online for non-urgent symptoms. For details visit [email or web address].   Also download the MyChart app! Go to the app store, search "MyChart", open the app, select [facility], and log in with your MyChart username and password.  Masks are optional in the cancer centers. If you would like for your care team to wear a mask while they are taking care of you, please let them know. You may have one support person who is at least 77 years old accompany you for your appointments.

## 2021-11-23 NOTE — Progress Notes (Signed)
Patient tolerated chemotherapy with no complaints voiced. Side effects with management reviewed understanding verbalized. Port site clean and dry with no bruising or swelling noted at site. Good blood return noted before and after administration of chemotherapy. Band aid applied. Patient left in satisfactory condition with VSS and no s/s of distress noted.

## 2021-11-26 ENCOUNTER — Inpatient Hospital Stay: Payer: Medicare HMO

## 2021-11-26 VITALS — BP 118/74 | HR 62 | Temp 97.6°F | Resp 18

## 2021-11-26 DIAGNOSIS — Z5111 Encounter for antineoplastic chemotherapy: Secondary | ICD-10-CM | POA: Diagnosis not present

## 2021-11-26 DIAGNOSIS — D469 Myelodysplastic syndrome, unspecified: Secondary | ICD-10-CM

## 2021-11-26 DIAGNOSIS — Z95828 Presence of other vascular implants and grafts: Secondary | ICD-10-CM

## 2021-11-26 MED ORDER — SODIUM CHLORIDE 0.9% FLUSH
10.0000 mL | INTRAVENOUS | Status: DC | PRN
  Administered 2021-11-26: 10 mL

## 2021-11-26 MED ORDER — SODIUM CHLORIDE 0.9 % IV SOLN
75.0000 mg/m2 | Freq: Once | INTRAVENOUS | Status: AC
  Administered 2021-11-26: 140 mg via INTRAVENOUS
  Filled 2021-11-26: qty 14

## 2021-11-26 MED ORDER — HEPARIN SOD (PORK) LOCK FLUSH 100 UNIT/ML IV SOLN
500.0000 [IU] | Freq: Once | INTRAVENOUS | Status: AC | PRN
  Administered 2021-11-26: 500 [IU]

## 2021-11-26 MED ORDER — SODIUM CHLORIDE 0.9 % IV SOLN
10.0000 mg | Freq: Once | INTRAVENOUS | Status: AC
  Administered 2021-11-26: 10 mg via INTRAVENOUS
  Filled 2021-11-26: qty 1

## 2021-11-26 MED ORDER — SODIUM CHLORIDE 0.9 % IV SOLN: Freq: Once | INTRAVENOUS | Status: AC

## 2021-11-26 MED ORDER — PALONOSETRON HCL INJECTION 0.25 MG/5ML
0.2500 mg | Freq: Once | INTRAVENOUS | Status: AC
  Administered 2021-11-26: 0.25 mg via INTRAVENOUS
  Filled 2021-11-26: qty 5

## 2021-11-26 NOTE — Patient Instructions (Signed)
[facility]  Discharge Instructions: Thank you for choosing [facility] to provide your oncology and hematology care.  If you have a lab appointment with the [facility], please come in thru the Main Entrance and check in at the main information desk.  Wear comfortable clothing and clothing appropriate for easy access to any Portacath or PICC line.   We strive to give you quality time with your provider. You may need to reschedule your appointment if you arrive late (15 or more minutes).  Arriving late affects you and other patients whose appointments are after yours.  Also, if you miss three or more appointments without notifying the office, you may be dismissed from the clinic at the provider's discretion.      For prescription refill requests, have your pharmacy contact our office and allow 72 hours for refills to be completed.    Today you received the following chemotherapy and/or immunotherapy agents Vidaza. Azacitidine Injection What is this medication? AZACITIDINE (ay [place]) treats blood and bone marrow cancers. It works by slowing down the growth of cancer cells. This medicine may be used for other purposes; ask your health care provider or pharmacist if you have questions. COMMON BRAND NAME(S): Vidaza What should I tell my care team before I take this medication? They need to know if you have any of these conditions: Kidney disease Liver disease Low blood cell levels, such as low white cells, platelets, or red blood cells Low levels of albumin in the blood Low levels of bicarbonate in the blood An unusual or allergic reaction to azacitidine, mannitol, other medications, foods, dyes, or preservatives If you or your partner are pregnant or trying to get pregnant Breast-feeding How should I use this medication? This medication is injected into a vein or under the skin. It is given by your care team in a hospital or clinic setting. Talk  to your care team about the use of this medication in children. While it may be prescribed for children as young as 1 month for selected conditions, precautions do apply. Overdosage: If you think you have taken too much of this medicine contact a poison control center or emergency room at once. NOTE: This medicine is only for you. Do not share this medicine with others. What if I miss a dose? Keep appointments for follow-up doses. It is important not to miss your dose. Call your care team if you are unable to keep an appointment. What may interact with this medication? Interactions are not expected. This list may not describe all possible interactions. Give your health care provider a list of all the medicines, herbs, non-prescription drugs, or dietary supplements you use. Also tell them if you smoke, drink alcohol, or use illegal drugs. Some items may interact with your medicine. What should I watch for while using this medication? Your condition will be monitored carefully while you are receiving this medication. You may need blood work while taking this medication. This medication may make you feel generally unwell. This is not uncommon as chemotherapy can affect healthy cells as well as cancer cells. Report any side effects. Continue your course of treatment even though you feel ill unless your care team tells you to stop. Other product types may be available that contain the medication azacitidine. The injection and oral products should not be used in place of one another. Talk to your care team if you have questions. This medication can cause serious side effects.  To reduce the risk, your care team may give you other medications to take before receiving this one. Be sure to follow the directions from your care team. This medication may increase your risk of getting an infection. Call your care team for advice if you get a fever, chills, sore throat, or other symptoms of a cold or flu. Do not treat  yourself. Try to avoid being around people who are sick. Avoid taking medications that contain aspirin, acetaminophen, ibuprofen, naproxen, or ketoprofen unless instructed by your care team. These medications may hide a fever. This medication may increase your risk to bruise or bleed. Call your care team if you notice any unusual bleeding. Be careful brushing or flossing your teeth or using a toothpick because you may get an infection or bleed more easily. If you have any dental work done, tell your dentist you are receiving this medication. Talk to your care team if you or your partner may be pregnant. Serious birth defects can occur if you take this medication during pregnancy and for 6 months after the last dose. You will need a negative pregnancy test before starting this medication. Contraception is recommended while taking his medication and for 6 months after the last dose. Your care team can help you find the option that works for you. If your partner can get pregnant, use a condom during sex while taking this medication and for 3 months after the last dose. Do not breastfeed while taking this medication and for 1 week after the last dose. This medication may cause infertility. Talk to your care team if you are concerned about your fertility. What side effects may I notice from receiving this medication? Side effects that you should report to your care team as soon as possible: Allergic reactions--skin rash, itching, hives, swelling of the face, lips, tongue, or throat Infection--fever, chills, cough, sore throat, wounds that don't heal, pain or trouble when passing urine, general feeling of discomfort or being unwell Kidney injury--decrease in the amount of urine, swelling of the ankles, hands, or feet Liver injury--right upper belly pain, loss of appetite, nausea, light-colored stool, dark yellow or brown urine, yellowing skin or eyes, unusual weakness or fatigue Low red blood cell  level--unusual weakness or fatigue, dizziness, headache, trouble breathing Tumor lysis syndrome (TLS)--nausea, vomiting, diarrhea, decrease in the amount of urine, dark urine, unusual weakness or fatigue, confusion, muscle pain or cramps, fast or irregular heartbeat, joint pain Unusual bruising or bleeding Side effects that usually do not require medical attention (report to your care team if they continue or are bothersome): Constipation Diarrhea Nausea Pain, redness, or irritation at injection site Vomiting This list may not describe all possible side effects. Call your doctor for medical advice about side effects. You may report side effects to FDA at 1-800-FDA-1088. Where should I keep my medication? This medication is given in a hospital or clinic. It will not be stored at home. NOTE: This sheet is a summary. It may not cover all possible information. If you have questions about this medicine, talk to your doctor, pharmacist, or health care provider.  2023 Elsevier/Gold Standard (2021-07-19 00:00:00)        To help prevent nausea and vomiting after your treatment, we encourage you to take your nausea medication as directed.  BELOW ARE SYMPTOMS THAT SHOULD BE REPORTED IMMEDIATELY: *FEVER GREATER THAN 100.4 F (38 C) OR HIGHER *CHILLS OR SWEATING *NAUSEA AND VOMITING THAT IS NOT CONTROLLED WITH YOUR NAUSEA MEDICATION *UNUSUAL SHORTNESS OF BREATH *  UNUSUAL BRUISING OR BLEEDING *URINARY PROBLEMS (pain or burning when urinating, or frequent urination) *BOWEL PROBLEMS (unusual diarrhea, constipation, pain near the anus) TENDERNESS IN MOUTH AND THROAT WITH OR WITHOUT PRESENCE OF ULCERS (sore throat, sores in mouth, or a toothache) UNUSUAL RASH, SWELLING OR PAIN  UNUSUAL VAGINAL DISCHARGE OR ITCHING   Items with * indicate a potential emergency and should be followed up as soon as possible or go to the Emergency Department if any problems should occur.  Please show the CHEMOTHERAPY  ALERT CARD or IMMUNOTHERAPY ALERT CARD at check-in to the Emergency Department and triage nurse.  Should you have questions after your visit or need to cancel or reschedule your appointment, please contact [facility] [phone]  and follow the prompts.  Office hours are 8:00 a.m. to 4:30 p.m. Monday - Friday. Please note that voicemails left after 4:00 p.m. may not be returned until the following business day.  We are closed weekends and major holidays. You have access to a nurse at all times for urgent questions. Please call the main number to the clinic [phone] and follow the prompts.  For any non-urgent questions, you may also contact your provider using MyChart. We now offer e-Visits for anyone 58 and older to request care online for non-urgent symptoms. For details visit [email or web address].   Also download the MyChart app! Go to the app store, search "MyChart", open the app, select [facility], and log in with your MyChart username and password.  Masks are optional in the cancer centers. If you would like for your care team to wear a mask while they are taking care of you, please let them know. You may have one support person who is at least 77 years old accompany you for your appointments.

## 2021-11-26 NOTE — Progress Notes (Signed)
Vidaza given today per MD orders. Tolerated infusion without adverse affects. Vital signs stable. No complaints at this time. Discharged from clinic ambulatory in stable condition. Alert and oriented x 3. F/U with [facility] as scheduled.

## 2021-11-26 NOTE — Progress Notes (Signed)
Patient presents today for chemotherapy infusion.  Patient is in satisfactory condition with no complaints voiced.  Vital signs are stable.  We will proceed with treatment per MD orders.

## 2021-11-27 ENCOUNTER — Inpatient Hospital Stay: Payer: Medicare HMO

## 2021-11-27 VITALS — BP 119/80 | HR 82 | Temp 98.0°F | Resp 18

## 2021-11-27 DIAGNOSIS — D469 Myelodysplastic syndrome, unspecified: Secondary | ICD-10-CM

## 2021-11-27 DIAGNOSIS — Z95828 Presence of other vascular implants and grafts: Secondary | ICD-10-CM

## 2021-11-27 DIAGNOSIS — Z5111 Encounter for antineoplastic chemotherapy: Secondary | ICD-10-CM | POA: Diagnosis not present

## 2021-11-27 MED ORDER — SODIUM CHLORIDE 0.9% FLUSH
10.0000 mL | INTRAVENOUS | Status: DC | PRN
  Administered 2021-11-27: 10 mL

## 2021-11-27 MED ORDER — SODIUM CHLORIDE 0.9 % IV SOLN: Freq: Once | INTRAVENOUS | Status: AC

## 2021-11-27 MED ORDER — HEPARIN SOD (PORK) LOCK FLUSH 100 UNIT/ML IV SOLN
500.0000 [IU] | Freq: Once | INTRAVENOUS | Status: AC | PRN
  Administered 2021-11-27: 500 [IU]

## 2021-11-27 MED ORDER — SODIUM CHLORIDE 0.9 % IV SOLN
75.0000 mg/m2 | Freq: Once | INTRAVENOUS | Status: AC
  Administered 2021-11-27: 140 mg via INTRAVENOUS
  Filled 2021-11-27: qty 14

## 2021-11-27 MED ORDER — SODIUM CHLORIDE 0.9 % IV SOLN
10.0000 mg | Freq: Once | INTRAVENOUS | Status: AC
  Administered 2021-11-27: 10 mg via INTRAVENOUS
  Filled 2021-11-27: qty 1

## 2021-11-27 NOTE — Progress Notes (Signed)
Vidaza given today per MD orders. Tolerated infusion without adverse affects. Vital signs stable. No complaints at this time. Discharged from clinic ambulatory in stable condition. Alert and oriented x 3. F/U with [facility] as scheduled.

## 2021-11-27 NOTE — Patient Instructions (Signed)
[facility]  Discharge Instructions: Thank you for choosing [facility] to provide your oncology and hematology care.  If you have a lab appointment with the [facility], please come in thru the Main Entrance and check in at the main information desk.  Wear comfortable clothing and clothing appropriate for easy access to any Portacath or PICC line.   We strive to give you quality time with your provider. You may need to reschedule your appointment if you arrive late (15 or more minutes).  Arriving late affects you and other patients whose appointments are after yours.  Also, if you miss three or more appointments without notifying the office, you may be dismissed from the clinic at the provider's discretion.      For prescription refill requests, have your pharmacy contact our office and allow 72 hours for refills to be completed.    Today you received the following chemotherapy and/or immunotherapy agents Vidaza.  Azacitidine Injection What is this medication? AZACITIDINE (ay [place]) treats blood and bone marrow cancers. It works by slowing down the growth of cancer cells. This medicine may be used for other purposes; ask your health care provider or pharmacist if you have questions. COMMON BRAND NAME(S): Vidaza What should I tell my care team before I take this medication? They need to know if you have any of these conditions: Kidney disease Liver disease Low blood cell levels, such as low white cells, platelets, or red blood cells Low levels of albumin in the blood Low levels of bicarbonate in the blood An unusual or allergic reaction to azacitidine, mannitol, other medications, foods, dyes, or preservatives If you or your partner are pregnant or trying to get pregnant Breast-feeding How should I use this medication? This medication is injected into a vein or under the skin. It is given by your care team in a hospital or clinic  setting. Talk to your care team about the use of this medication in children. While it may be prescribed for children as young as 1 month for selected conditions, precautions do apply. Overdosage: If you think you have taken too much of this medicine contact a poison control center or emergency room at once. NOTE: This medicine is only for you. Do not share this medicine with others. What if I miss a dose? Keep appointments for follow-up doses. It is important not to miss your dose. Call your care team if you are unable to keep an appointment. What may interact with this medication? Interactions are not expected. This list may not describe all possible interactions. Give your health care provider a list of all the medicines, herbs, non-prescription drugs, or dietary supplements you use. Also tell them if you smoke, drink alcohol, or use illegal drugs. Some items may interact with your medicine. What should I watch for while using this medication? Your condition will be monitored carefully while you are receiving this medication. You may need blood work while taking this medication. This medication may make you feel generally unwell. This is not uncommon as chemotherapy can affect healthy cells as well as cancer cells. Report any side effects. Continue your course of treatment even though you feel ill unless your care team tells you to stop. Other product types may be available that contain the medication azacitidine. The injection and oral products should not be used in place of one another. Talk to your care team if you have questions. This medication can cause serious side  effects. To reduce the risk, your care team may give you other medications to take before receiving this one. Be sure to follow the directions from your care team. This medication may increase your risk of getting an infection. Call your care team for advice if you get a fever, chills, sore throat, or other symptoms of a cold or  flu. Do not treat yourself. Try to avoid being around people who are sick. Avoid taking medications that contain aspirin, acetaminophen, ibuprofen, naproxen, or ketoprofen unless instructed by your care team. These medications may hide a fever. This medication may increase your risk to bruise or bleed. Call your care team if you notice any unusual bleeding. Be careful brushing or flossing your teeth or using a toothpick because you may get an infection or bleed more easily. If you have any dental work done, tell your dentist you are receiving this medication. Talk to your care team if you or your partner may be pregnant. Serious birth defects can occur if you take this medication during pregnancy and for 6 months after the last dose. You will need a negative pregnancy test before starting this medication. Contraception is recommended while taking his medication and for 6 months after the last dose. Your care team can help you find the option that works for you. If your partner can get pregnant, use a condom during sex while taking this medication and for 3 months after the last dose. Do not breastfeed while taking this medication and for 1 week after the last dose. This medication may cause infertility. Talk to your care team if you are concerned about your fertility. What side effects may I notice from receiving this medication? Side effects that you should report to your care team as soon as possible: Allergic reactions--skin rash, itching, hives, swelling of the face, lips, tongue, or throat Infection--fever, chills, cough, sore throat, wounds that don't heal, pain or trouble when passing urine, general feeling of discomfort or being unwell Kidney injury--decrease in the amount of urine, swelling of the ankles, hands, or feet Liver injury--right upper belly pain, loss of appetite, nausea, light-colored stool, dark yellow or brown urine, yellowing skin or eyes, unusual weakness or fatigue Low red  blood cell level--unusual weakness or fatigue, dizziness, headache, trouble breathing Tumor lysis syndrome (TLS)--nausea, vomiting, diarrhea, decrease in the amount of urine, dark urine, unusual weakness or fatigue, confusion, muscle pain or cramps, fast or irregular heartbeat, joint pain Unusual bruising or bleeding Side effects that usually do not require medical attention (report to your care team if they continue or are bothersome): Constipation Diarrhea Nausea Pain, redness, or irritation at injection site Vomiting This list may not describe all possible side effects. Call your doctor for medical advice about side effects. You may report side effects to FDA at 1-800-FDA-1088. Where should I keep my medication? This medication is given in a hospital or clinic. It will not be stored at home. NOTE: This sheet is a summary. It may not cover all possible information. If you have questions about this medicine, talk to your doctor, pharmacist, or health care provider.  2023 Elsevier/Gold Standard (2021-07-19 00:00:00)       To help prevent nausea and vomiting after your treatment, we encourage you to take your nausea medication as directed.  BELOW ARE SYMPTOMS THAT SHOULD BE REPORTED IMMEDIATELY: *FEVER GREATER THAN 100.4 F (38 C) OR HIGHER *CHILLS OR SWEATING *NAUSEA AND VOMITING THAT IS NOT CONTROLLED WITH YOUR NAUSEA MEDICATION *UNUSUAL SHORTNESS OF BREATH *  UNUSUAL BRUISING OR BLEEDING *URINARY PROBLEMS (pain or burning when urinating, or frequent urination) *BOWEL PROBLEMS (unusual diarrhea, constipation, pain near the anus) TENDERNESS IN MOUTH AND THROAT WITH OR WITHOUT PRESENCE OF ULCERS (sore throat, sores in mouth, or a toothache) UNUSUAL RASH, SWELLING OR PAIN  UNUSUAL VAGINAL DISCHARGE OR ITCHING   Items with * indicate a potential emergency and should be followed up as soon as possible or go to the Emergency Department if any problems should occur.  Please show the  CHEMOTHERAPY ALERT CARD or IMMUNOTHERAPY ALERT CARD at check-in to the Emergency Department and triage nurse.  Should you have questions after your visit or need to cancel or reschedule your appointment, please contact [facility] [phone]  and follow the prompts.  Office hours are 8:00 a.m. to 4:30 p.m. Monday - Friday. Please note that voicemails left after 4:00 p.m. may not be returned until the following business day.  We are closed weekends and major holidays. You have access to a nurse at all times for urgent questions. Please call the main number to the clinic [phone] and follow the prompts.  For any non-urgent questions, you may also contact your provider using MyChart. We now offer e-Visits for anyone 60 and older to request care online for non-urgent symptoms. For details visit [email or web address].   Also download the MyChart app! Go to the app store, search "MyChart", open the app, select [facility], and log in with your MyChart username and password.  Masks are optional in the cancer centers. If you would like for your care team to wear a mask while they are taking care of you, please let them know. You may have one support person who is at least 77 years old accompany you for your appointments.

## 2021-11-28 ENCOUNTER — Inpatient Hospital Stay: Payer: Medicare HMO

## 2021-11-28 VITALS — BP 128/70 | HR 80 | Temp 98.0°F | Resp 18

## 2021-11-28 DIAGNOSIS — Z95828 Presence of other vascular implants and grafts: Secondary | ICD-10-CM

## 2021-11-28 DIAGNOSIS — D469 Myelodysplastic syndrome, unspecified: Secondary | ICD-10-CM

## 2021-11-28 DIAGNOSIS — Z5111 Encounter for antineoplastic chemotherapy: Secondary | ICD-10-CM | POA: Diagnosis not present

## 2021-11-28 MED ORDER — SODIUM CHLORIDE 0.9 % IV SOLN
10.0000 mg | Freq: Once | INTRAVENOUS | Status: AC
  Administered 2021-11-28: 10 mg via INTRAVENOUS
  Filled 2021-11-28: qty 10

## 2021-11-28 MED ORDER — SODIUM CHLORIDE 0.9% FLUSH
10.0000 mL | INTRAVENOUS | Status: DC | PRN
  Administered 2021-11-28: 10 mL

## 2021-11-28 MED ORDER — PALONOSETRON HCL INJECTION 0.25 MG/5ML
0.2500 mg | Freq: Once | INTRAVENOUS | Status: AC
  Administered 2021-11-28: 0.25 mg via INTRAVENOUS
  Filled 2021-11-28: qty 5

## 2021-11-28 MED ORDER — SODIUM CHLORIDE 0.9 % IV SOLN: Freq: Once | INTRAVENOUS | Status: AC

## 2021-11-28 MED ORDER — HEPARIN SOD (PORK) LOCK FLUSH 100 UNIT/ML IV SOLN
500.0000 [IU] | Freq: Once | INTRAVENOUS | Status: AC | PRN
  Administered 2021-11-28: 500 [IU]

## 2021-11-28 MED ORDER — SODIUM CHLORIDE 0.9 % IV SOLN
75.0000 mg/m2 | Freq: Once | INTRAVENOUS | Status: AC
  Administered 2021-11-28: 140 mg via INTRAVENOUS
  Filled 2021-11-28: qty 14

## 2021-11-28 NOTE — Progress Notes (Signed)
Patient presents today for Vidaza infusion per providers order.  Vital signs within parameters for treatment.  Patient has no new complaints at this time.  Stable during infusion without adverse affects.  Vital signs stable.  No complaints at this time.  Discharge from clinic ambulatory in stable condition.  Alert and oriented X 3.  Follow up with [facility] as scheduled.

## 2021-11-28 NOTE — Patient Instructions (Signed)
[place]  Discharge Instructions: Thank you for choosing [facility] to provide your oncology and hematology care.  If you have a lab appointment with the [facility], please come in thru the Main Entrance and check in at the main information desk.  Wear comfortable clothing and clothing appropriate for easy access to any Portacath or PICC line.   We strive to give you quality time with your provider. You may need to reschedule your appointment if you arrive late (15 or more minutes).  Arriving late affects you and other patients whose appointments are after yours.  Also, if you miss three or more appointments without notifying the office, you may be dismissed from the clinic at the provider's discretion.      For prescription refill requests, have your pharmacy contact our office and allow 72 hours for refills to be completed.    Today you received the following chemotherapy and/or immunotherapy agents Vidaza      To help prevent nausea and vomiting after your treatment, we encourage you to take your nausea medication as directed.  BELOW ARE SYMPTOMS THAT SHOULD BE REPORTED IMMEDIATELY: *FEVER GREATER THAN 100.4 F (38 C) OR HIGHER *CHILLS OR SWEATING *NAUSEA AND VOMITING THAT IS NOT CONTROLLED WITH YOUR NAUSEA MEDICATION *UNUSUAL SHORTNESS OF BREATH *UNUSUAL BRUISING OR BLEEDING *URINARY PROBLEMS (pain or burning when urinating, or frequent urination) *BOWEL PROBLEMS (unusual diarrhea, constipation, pain near the anus) TENDERNESS IN MOUTH AND THROAT WITH OR WITHOUT PRESENCE OF ULCERS (sore throat, sores in mouth, or a toothache) UNUSUAL RASH, SWELLING OR PAIN  UNUSUAL VAGINAL DISCHARGE OR ITCHING   Items with * indicate a potential emergency and should be followed up as soon as possible or go to the Emergency Department if any problems should occur.  Please show the CHEMOTHERAPY ALERT CARD or IMMUNOTHERAPY ALERT CARD at check-in to the Emergency  Department and triage nurse.  Should you have questions after your visit or need to cancel or reschedule your appointment, please contact [facility] [phone]  and follow the prompts.  Office hours are 8:00 a.m. to 4:30 p.m. Monday - Friday. Please note that voicemails left after 4:00 p.m. may not be returned until the following business day.  We are closed weekends and major holidays. You have access to a nurse at all times for urgent questions. Please call the main number to the clinic [phone] and follow the prompts.  For any non-urgent questions, you may also contact your provider using MyChart. We now offer e-Visits for anyone 66 and older to request care online for non-urgent symptoms. For details visit [email or web address].   Also download the MyChart app! Go to the app store, search "MyChart", open the app, select [facility], and log in with your MyChart username and password.  Masks are optional in the cancer centers. If you would like for your care team to wear a mask while they are taking care of you, please let them know. You may have one support person who is at least 77 years old accompany you for your appointments.

## 2021-11-29 ENCOUNTER — Inpatient Hospital Stay: Payer: Medicare HMO

## 2021-11-30 ENCOUNTER — Inpatient Hospital Stay: Payer: Medicare HMO

## 2021-12-03 ENCOUNTER — Inpatient Hospital Stay: Payer: Medicare HMO

## 2021-12-04 ENCOUNTER — Inpatient Hospital Stay: Payer: Medicare HMO

## 2021-12-13 ENCOUNTER — Other Ambulatory Visit: Payer: Self-pay

## 2021-12-13 DIAGNOSIS — D469 Myelodysplastic syndrome, unspecified: Secondary | ICD-10-CM

## 2021-12-17 ENCOUNTER — Inpatient Hospital Stay: Payer: Medicare HMO | Attending: Hematology | Admitting: Hematology

## 2021-12-17 ENCOUNTER — Inpatient Hospital Stay: Payer: Medicare HMO

## 2021-12-17 VITALS — BP 135/81 | HR 62 | Temp 97.0°F | Resp 18

## 2021-12-17 DIAGNOSIS — K5900 Constipation, unspecified: Secondary | ICD-10-CM | POA: Insufficient documentation

## 2021-12-17 DIAGNOSIS — D4622 Refractory anemia with excess of blasts 2: Secondary | ICD-10-CM | POA: Insufficient documentation

## 2021-12-17 DIAGNOSIS — Z95828 Presence of other vascular implants and grafts: Secondary | ICD-10-CM

## 2021-12-17 DIAGNOSIS — D469 Myelodysplastic syndrome, unspecified: Secondary | ICD-10-CM | POA: Diagnosis not present

## 2021-12-17 DIAGNOSIS — Z5111 Encounter for antineoplastic chemotherapy: Secondary | ICD-10-CM | POA: Diagnosis present

## 2021-12-17 LAB — CBC WITH DIFFERENTIAL/PLATELET
Abs Immature Granulocytes: 0 10*3/uL (ref 0.00–0.07)
Basophils Absolute: 0 10*3/uL (ref 0.0–0.1)
Basophils Relative: 1 %
Eosinophils Absolute: 0.1 10*3/uL (ref 0.0–0.5)
Eosinophils Relative: 4 %
HCT: 34 % — ABNORMAL LOW (ref 39.0–52.0)
Hemoglobin: 11.4 g/dL — ABNORMAL LOW (ref 13.0–17.0)
Immature Granulocytes: 0 %
Lymphocytes Relative: 69 %
Lymphs Abs: 1.4 10*3/uL (ref 0.7–4.0)
MCH: 31.1 pg (ref 26.0–34.0)
MCHC: 33.5 g/dL (ref 30.0–36.0)
MCV: 92.6 fL (ref 80.0–100.0)
Monocytes Absolute: 0.1 10*3/uL (ref 0.1–1.0)
Monocytes Relative: 5 %
Neutro Abs: 0.4 10*3/uL — CL (ref 1.7–7.7)
Neutrophils Relative %: 21 %
Platelets: 108 10*3/uL — ABNORMAL LOW (ref 150–400)
RBC: 3.67 MIL/uL — ABNORMAL LOW (ref 4.22–5.81)
RDW: 15.9 % — ABNORMAL HIGH (ref 11.5–15.5)
WBC: 2 10*3/uL — ABNORMAL LOW (ref 4.0–10.5)
nRBC: 0 % (ref 0.0–0.2)

## 2021-12-17 LAB — COMPREHENSIVE METABOLIC PANEL
ALT: 14 U/L (ref 0–44)
AST: 17 U/L (ref 15–41)
Albumin: 3.8 g/dL (ref 3.5–5.0)
Alkaline Phosphatase: 62 U/L (ref 38–126)
Anion gap: 6 (ref 5–15)
BUN: 11 mg/dL (ref 8–23)
CO2: 25 mmol/L (ref 22–32)
Calcium: 9.2 mg/dL (ref 8.9–10.3)
Chloride: 107 mmol/L (ref 98–111)
Creatinine, Ser: 0.95 mg/dL (ref 0.61–1.24)
GFR, Estimated: >60 mL/min (ref >60)
Glucose, Bld: 104 mg/dL — ABNORMAL HIGH (ref 70–99)
Potassium: 3.5 mmol/L (ref 3.5–5.1)
Sodium: 138 mmol/L (ref 135–145)
Total Bilirubin: 1.9 mg/dL — ABNORMAL HIGH (ref 0.3–1.2)
Total Protein: 6.1 g/dL — ABNORMAL LOW (ref 6.5–8.1)

## 2021-12-17 LAB — MAGNESIUM: Magnesium: 2.1 mg/dL (ref 1.7–2.4)

## 2021-12-17 MED ORDER — SODIUM CHLORIDE 0.9 % IV SOLN: Freq: Once | INTRAVENOUS | Status: AC

## 2021-12-17 MED ORDER — PALONOSETRON HCL INJECTION 0.25 MG/5ML
0.2500 mg | Freq: Once | INTRAVENOUS | Status: AC
  Administered 2021-12-17: 0.25 mg via INTRAVENOUS
  Filled 2021-12-17: qty 5

## 2021-12-17 MED ORDER — HEPARIN SOD (PORK) LOCK FLUSH 100 UNIT/ML IV SOLN
500.0000 [IU] | Freq: Once | INTRAVENOUS | Status: AC | PRN
  Administered 2021-12-17: 500 [IU]

## 2021-12-17 MED ORDER — SODIUM CHLORIDE 0.9% FLUSH
10.0000 mL | INTRAVENOUS | Status: DC | PRN
  Administered 2021-12-17: 10 mL

## 2021-12-17 MED ORDER — SODIUM CHLORIDE 0.9 % IV SOLN
75.0000 mg/m2 | Freq: Once | INTRAVENOUS | Status: AC
  Administered 2021-12-17: 140 mg via INTRAVENOUS
  Filled 2021-12-17: qty 14

## 2021-12-17 MED ORDER — SODIUM CHLORIDE 0.9 % IV SOLN
10.0000 mg | Freq: Once | INTRAVENOUS | Status: AC
  Administered 2021-12-17: 10 mg via INTRAVENOUS
  Filled 2021-12-17: qty 1

## 2021-12-17 NOTE — Progress Notes (Signed)
Patient has been examined by Dr. Delton Coombes, and vital signs and labs have been reviewed. ANC (0.4), Creatinine, LFTs (bilirubin 1.9), hemoglobin, and platelets are within treatment parameters per M.D. - pt may proceed with treatment.  Primary RN and pharmacy notified.

## 2021-12-17 NOTE — Progress Notes (Signed)
CRITICAL VALUE ALERT Critical value received:  ANC 0.4.  Date of notification:  12-17-2021 Time of notification: 10:43 am.  Critical value read back:  Yes.   Nurse who received alert:  B. Nizhoni Parlow RN MD notified time and response:  Dr. Delton Coombes / A. Beckie Salts. Patient has f/u appointment with Dr. Delton Coombes today.

## 2021-12-17 NOTE — Patient Instructions (Addendum)
[place] at [facility] Discharge Instructions   You were seen and examined today by Dr. Delton Coombes.  He reviewed the results of your lab work which are normal/stable.   We will proceed with your treatment today and this week.  Return as scheduled.    Thank you for choosing [facility] at [facility] to provide your oncology and hematology care.  To afford each patient quality time with our provider, please arrive at least 15 minutes before your scheduled appointment time.   If you have a lab appointment with the [facility] please come in thru the Main Entrance and check in at the main information desk.  You need to re-schedule your appointment should you arrive 10 or more minutes late.  We strive to give you quality time with our providers, and arriving late affects you and other patients whose appointments are after yours.  Also, if you no show three or more times for appointments you may be dismissed from the clinic at the providers discretion.     Again, thank you for choosing [facility].  Our hope is that these requests will decrease the amount of time that you wait before being seen by our physicians.       _____________________________________________________________  Should you have questions after your visit to [facility], please contact our office at [phone] and follow the prompts.  Our office hours are 8:00 a.m. and 4:30 p.m. Monday - Friday.  Please note that voicemails left after 4:00 p.m. may not be returned until the following business day.  We are closed weekends and major holidays.  You do have access to a nurse 24-7, just call the main number to the clinic [phone] and do not press any options, hold on the line and a nurse will answer the phone.    For prescription refill requests, have your pharmacy contact our office and allow 72 hours.    Due to Covid, you will need to wear a mask  upon entering the hospital. If you do not have a mask, a mask will be given to you at the Main Entrance upon arrival. For doctor visits, patients may have 1 support person age 27 or older with them. For treatment visits, patients can not have anyone with them due to social distancing guidelines and our immunocompromised population.     3

## 2021-12-17 NOTE — Progress Notes (Signed)
[address] [place], [place] [address]   CLINIC:  Medical Oncology/Hematology  PCP:  [name], [place] / [place] [address] [phone]   REASON FOR VISIT:  Follow-up for MDS with EB 2  PRIOR THERAPY: none  NGS Results: ASXL 1 mutation  CURRENT THERAPY: Azacitidine IV D1-7 q28d (started 06/18/21)  BRIEF ONCOLOGIC HISTORY:  Oncology History  Myelodysplastic syndrome ([facility])  06/17/2021 Initial Diagnosis   Myelodysplastic syndrome ([facility])   07/30/2021 - 10/30/2021 Chemotherapy   Patient is on Treatment Plan : MYELODYSPLASIA  Azacitidine IV D1-7 q28d     07/30/2021 -  Chemotherapy   Patient is on Treatment Plan : MYELODYSPLASIA  Azacitidine IV D1-7 q28d       CANCER STAGING:  Cancer Staging  No matching staging information was found for the patient.  INTERVAL HISTORY:  Chad Lamb, a 76 y.o. male, seen for follow-up of myelodysplastic syndrome.  Since last treatment 4 weeks ago, he denies any fevers, night sweats or weight loss.  He reported constipation which is controlled fairly well with Colace and MiraLAX.  He has shortness of breath when he swims for a long time.  He has to take some breaks.  Denies any chest pains.   REVIEW OF SYSTEMS:  Review of Systems  Constitutional:  Negative for appetite change and fatigue.  HENT:   Negative for nosebleeds and trouble swallowing.   Respiratory:  Positive for shortness of breath (Only on exertion while swimming). Negative for hemoptysis.   Gastrointestinal:  Positive for constipation. Negative for blood in stool and nausea.  Genitourinary:  Negative for hematuria.   Hematological:  Does not bruise/bleed easily.  Psychiatric/Behavioral:  The patient is not nervous/anxious.   All other systems reviewed and are negative.   PAST MEDICAL/SURGICAL HISTORY:  Past Medical History:  Diagnosis Date   Allergy    Anxiety    Arthritis    Asthma    Cataract    Clotting disorder  ([facility])    DVT (deep venous thrombosis) ([facility])    unknown etiology. [facility]   GERD (gastroesophageal reflux disease)    History of dental surgery    feb 2023   Hyperlipidemia    Hypertension    MDS (myelodysplastic syndrome), high grade ([facility]) 06/17/2021   Pneumonia    Port-A-Cath in place 07/30/2021   Past Surgical History:  Procedure Laterality Date   CATARACT EXTRACTION W/PHACO Right 11/24/2017   Procedure: CATARACT EXTRACTION PHACO AND INTRAOCULAR LENS PLACEMENT RIGHT EYE;  Surgeon: [name], MD;  Location: [facility];  Service: Ophthalmology;  Laterality: Right;  CDE: 8.61   CATARACT EXTRACTION W/PHACO Left 12/29/2017   Procedure: CATARACT EXTRACTION PHACO AND INTRAOCULAR LENS PLACEMENT (IOC);  Surgeon: [name], MD;  Location: [facility];  Service: Ophthalmology;  Laterality: Left;  CDE: 7.21   GANGLION CYST EXCISION     HERNIA REPAIR     bilateral   PARS PLANA VITRECTOMY Left 05/17/2021   Procedure: PARS PLANA VITRECTOMY 25 GAUGE WITH ANTIBIOTIC INJECTION  AND ENDOLASER LEFT EYE;  Surgeon: [name], MD;  Location: [facility];  Service: Ophthalmology;  Laterality: Left;   PORTACATH PLACEMENT Right 07/09/2021   Procedure: INSERTION PORT-A-CATH;  Surgeon: Aviva Signs, MD;  Location: [facility];  Service: General;  Laterality: Right;   TONSILLECTOMY     VASCULAR SURGERY      SOCIAL HISTORY:  Social History   Socioeconomic History   Marital status: Married  Spouse name: Chad Lamb   Number of children: 0   Years of education: 13   Highest education level: High school graduate  Occupational History   Not on file  Tobacco Use   Smoking status: Former    Types: Pipe    Quit date: 03/21/1966    Years since quitting: 55.7   Smokeless tobacco: Never  Vaping Use   Vaping Use: Never used  Substance and Sexual Activity   Alcohol use: Yes    Alcohol/week: 1.0 standard drink of alcohol    Types: 1 Cans of beer per week    Comment: 1 can of beer a week   Drug use: Never   Sexual  activity: Not Currently  Other Topics Concern   Not on file  Social History Narrative   ** Merged History Encounter **       Grew up in [place], [place] and [place].  Retired.  Worked in [employer]. 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    Social Determinants of Health   Financial Resource Strain: Low Risk  (01/23/2017)   Overall Financial Resource Strain (CARDIA)    Difficulty of Paying Living Expenses: Not hard at all  Food Insecurity: No Food Insecurity (01/23/2017)   Hunger Vital Sign    Worried About Running Out of Food in the Last Year: Never true    Ran Out of Food in the Last Year: Never true  Transportation Needs: No Transportation Needs (01/23/2017)   PRAPARE - Hydrologist (Medical): No    Lack of Transportation (Non-Medical): No  Physical Activity: Unknown (01/23/2017)   Exercise Vital Sign    Days of Exercise per Week: 3 days    Minutes of Exercise per Session: Not on file  Stress: Stress Concern Present (01/23/2017)   [place]    Feeling of Stress : To some extent  Social Connections: Somewhat Isolated (01/23/2017)   Social Connection and Isolation Panel [NHANES]    Frequency of Communication with Friends and Family: More than three times a week    Frequency of Social Gatherings with Friends and Family: More than three times a week    Attends Religious Services: Never    Marine scientist or Organizations: No    Attends Archivist Meetings: Never    Marital Status: Married  Human resources officer Violence: Not At Risk (01/23/2017)   Humiliation, Afraid, Rape, and Kick questionnaire    Fear of Current or Ex-Partner: No    Emotionally Abused: No    Physically Abused: No    Sexually Abused: No    FAMILY HISTORY:  Family History  Problem Relation Age of Onset   Cancer Mother        pancreatic   Arthritis Father     Early death Father 27       Lung infiltrate   Colon cancer Neg Hx    Colon polyps Neg Hx     CURRENT MEDICATIONS:  Current Outpatient Medications  Medication Sig Dispense Refill   albuterol (PROVENTIL HFA;VENTOLIN HFA) 108 (90 Base) MCG/ACT inhaler Inhale 1-2 puffs into the lungs every 6 (six) hours as needed for wheezing or shortness of breath.     ALPRAZolam (XANAX) 0.5 MG tablet Take 0.5 mg by mouth 2 (two) times daily as needed for anxiety.     amLODipine (NORVASC) 2.5 MG tablet Take 2.5 mg by mouth daily.  azaCITIDine 5 mg/2 mLs in lactated ringers infusion Inject into the vein daily. Days 1-7 every 28 days     fluticasone furoate-vilanterol (BREO ELLIPTA) 200-25 MCG/ACT AEPB Inhale 1 puff into the lungs daily.     Lidocaine-Hydrocort, Perianal, 3-0.5 % CREA Apply topically 2 (two) times daily as needed.     Lidocaine-Hydrocortisone Ace 3-0.5 % CREA Apply 1 application topically as directed. (Patient taking differently: Apply 1 application  topically 2 (two) times daily as needed (hemorrhoids).) 30 Tube 11   loratadine (CLARITIN) 10 MG tablet Take 10 mg by mouth daily.     Lutein 40 MG CAPS Take 40 mg by mouth daily.     Multiple Vitamins-Minerals (CENTRUM ADULTS PO) Take 1 tablet by mouth daily.     nystatin cream (MYCOSTATIN) Apply 1 application. topically 2 (two) times daily as needed for dry skin.     ofloxacin (OCUFLOX) 0.3 % ophthalmic solution Place 1 drop into the left eye See admin instructions. 1 drop to left eye 4 x daily for 7 days after monthly procedure.     omeprazole (PRILOSEC OTC) 20 MG tablet Take 5-10 mg by mouth daily.     sildenafil (VIAGRA) 50 MG tablet Take 50 mg by mouth daily as needed for erectile dysfunction.     traMADol (ULTRAM) 50 MG tablet Take 1 tablet (50 mg total) by mouth every 6 (six) hours as needed. 15 tablet 0   No current facility-administered medications for this visit.   Facility-Administered Medications Ordered in Other Visits  Medication  Dose Route Frequency Provider Last Rate Last Admin   sodium chloride flush (NS) 0.9 % injection 10 mL  10 mL Intracatheter PRN [name], MD   10 mL at 12/17/21 1217    ALLERGIES:  Allergies  Allergen Reactions   Cephalosporins Itching   Amoxapine And Related Itching and Rash    Has patient had a PCN reaction causing immediate rash, facial/tongue/throat swelling, SOB or lightheadedness with hypotension: No Has patient had a PCN reaction causing severe rash involving mucus membranes or skin necrosis: No Has patient had a PCN reaction that required hospitalization: No Has patient had a PCN reaction occurring within the last 10 years: No If all of the above answers are "NO", then may proceed with Cephalosporin use.     Amoxicillin Rash   Cephalexin Rash   Clindamycin/Lincomycin Itching and Rash   Sulfamethoxazole Itching and Rash   Trimethoprim Rash    PHYSICAL EXAM:  Performance status (ECOG): 1 - Symptomatic but completely ambulatory  There were no vitals filed for this visit. Wt Readings from Last 3 Encounters:  12/17/21 157 lb 9.6 oz (71.5 kg)  11/20/21 158 lb 3.2 oz (71.8 kg)  10/29/21 154 lb 9.6 oz (70.1 kg)   Physical Exam Vitals reviewed.  Constitutional:      Appearance: Normal appearance.  Cardiovascular:     Heart sounds: Normal heart sounds.  Pulmonary:     Effort: Pulmonary effort is normal.     Breath sounds: Normal breath sounds.  Neurological:     Mental Status: He is alert and oriented to person, place, and time.  Psychiatric:        Mood and Affect: Mood normal.        Behavior: Behavior normal.     LABORATORY DATA:  I have reviewed the labs as listed.     Latest Ref Rng & Units 12/17/2021    9:12 AM 11/20/2021    9:34 AM 10/22/2021   12:27  PM  CBC  WBC 4.0 - 10.5 K/uL 2.0  2.2  1.9   Hemoglobin 13.0 - 17.0 g/dL 11.4  11.5  10.9   Hematocrit 39.0 - 52.0 % 34.0  34.1  32.6   Platelets 150 - 400 K/uL 108  155  153       Latest Ref Rng  & Units 12/17/2021    9:12 AM 11/20/2021    9:33 AM 10/22/2021   12:27 PM  CMP  Glucose 70 - 99 mg/dL 104  107  105   BUN 8 - 23 mg/dL _0 Creatinine 0.61 - 1.24 mg/dL 0.95  0.85  0.87   Sodium 135 - 145 mmol/L 138  140  137   Potassium 3.5 - 5.1 mmol/L 3.5  3.7  3.5   Chloride 98 - 111 mmol/L 107  108  108   CO2 22 - 32 mmol/L _1 Calcium 8.9 - 10.3 mg/dL 9.2  9.3  9.1   Total Protein 6.5 - 8.1 g/dL 6.1  6.2  6.3   Total Bilirubin 0.3 - 1.2 mg/dL 1.9  1.7  1.3   Alkaline Phos 38 - 126 U/L 62  66  62   AST 15 - 41 U/L _2 ALT 0 - 44 U/L _3 DIAGNOSTIC IMAGING:  I have independently reviewed the scans and discussed with the patient. No results found.   ASSESSMENT:  MDS with EB 2: - Seen at the request of Dr. Mannie Stabile for abnormal CBC. - Labs on 12/06/2020 shows white count 3.0 with 41% neutrophils, 41% lymphocytes, 13.8% monocytes.  ANC was 1.2.  Hemoglobin 13.4 and normal.  MCV was slightly high at 96.  Platelet count was 128.  Vitamin H84 and folic acid were normal. - He took antibiotics for teeth implants few times this year, last 1 on 11/16/2020.  He does not know the antibiotic name.  He also reports taking over-the-counter pill for memory for 5 to 6 months, which was stopped around September 2022. - Labs from 07/07/2020 with creatinine 0.92.  White count 3.3 (37% neutrophils, 45% lymphocytes, 14% monocytes, 3% eosinophils), ANC 1.2.  Platelet count 149. - His prior CBC from 12/08/2017 in our system was completely normal. - Reports slight worsening of the night sweats in the last 3 months.  At least once per week and has to change clothes.  No fevers or weight loss. - BMBX on 06/04/2021: Hypercellular marrow with dyspoietic changes involving the granulocytic cell line and megakaryocytes.  Myeloblasts 8% on aspirate smears and 10% by flow.  Cytogenetics 56, XY (20).  MDS FISH panel normal. 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 - IPSS-M score of 0.12,  moderate high risk.  Leukemia free survival is 2.3 years.  Overall survival 2.8 years. - Cycle 1 of azacitidine on 07/30/2021   Social/family history: - He lives at home with his wife.  He swims about 40 minutes daily. 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  Non-smoker. - Paternal aunt had breast cancer and mother had pancreatic cancer.  3.  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 - He underwent thrombolytic therapy at [facility]. - He will followed up with vascular at [facility] and has been on Xarelto since then.  PLAN:  MDS with EB 2: - He has tolerated cycle 5 for 7 days, full dose very well.  No major side effects other than constipation. - He has dyspnea on exertion while he swims.  Hemoglobin today is 11.4. - White count is 2.0 with ANC of 0.4.  PLT is 108. - He will proceed with cycle 6 today.  I will arrange for bone marrow biopsy in 4 weeks with chromosome analysis and MDS FISH panel.  RTC 5 weeks for follow-up and next cycle.   Unprovoked left subclavian DVT: - Xarelto discontinued due to hemorrhoidal bleeding.  3.  Constipation:       - This is from [place].  Continue Colace 3 times daily and MiraLAX at bedtime which is helping.   Orders placed this encounter:  Orders Placed This Encounter  Procedures   CT BONE MARROW BIOPSY & ASPIRATION   CBC with Differential   Basic metabolic panel   CBC with Differential   Basic metabolic panel   CBC with Differential   Basic metabolic panel   CBC with Differential   Basic metabolic panel      [name], MD [place] [phone]

## 2021-12-17 NOTE — Patient Instructions (Signed)
[facility]  Discharge Instructions: Thank you for choosing [facility] to provide your oncology and hematology care.  If you have a lab appointment with the [facility], please come in thru the Main Entrance and check in at the main information desk.  Wear comfortable clothing and clothing appropriate for easy access to any Portacath or PICC line.   We strive to give you quality time with your provider. You may need to reschedule your appointment if you arrive late (15 or more minutes).  Arriving late affects you and other patients whose appointments are after yours.  Also, if you miss three or more appointments without notifying the office, you may be dismissed from the clinic at the provider's discretion.      For prescription refill requests, have your pharmacy contact our office and allow 72 hours for refills to be completed.    Today you received the following chemotherapy and/or immunotherapy agents azicitidine. Azacitidine Injection What is this medication? AZACITIDINE (ay [place]) treats blood and bone marrow cancers. It works by slowing down the growth of cancer cells. This medicine may be used for other purposes; ask your health care provider or pharmacist if you have questions. COMMON BRAND NAME(S): Vidaza What should I tell my care team before I take this medication? They need to know if you have any of these conditions: Kidney disease Liver disease Low blood cell levels, such as low white cells, platelets, or red blood cells Low levels of albumin in the blood Low levels of bicarbonate in the blood An unusual or allergic reaction to azacitidine, mannitol, other medications, foods, dyes, or preservatives If you or your partner are pregnant or trying to get pregnant Breast-feeding How should I use this medication? This medication is injected into a vein or under the skin. It is given by your care team in a hospital or clinic  setting. Talk to your care team about the use of this medication in children. While it may be prescribed for children as young as 1 month for selected conditions, precautions do apply. Overdosage: If you think you have taken too much of this medicine contact a poison control center or emergency room at once. NOTE: This medicine is only for you. Do not share this medicine with others. What if I miss a dose? Keep appointments for follow-up doses. It is important not to miss your dose. Call your care team if you are unable to keep an appointment. What may interact with this medication? Interactions are not expected. This list may not describe all possible interactions. Give your health care provider a list of all the medicines, herbs, non-prescription drugs, or dietary supplements you use. Also tell them if you smoke, drink alcohol, or use illegal drugs. Some items may interact with your medicine. What should I watch for while using this medication? Your condition will be monitored carefully while you are receiving this medication. You may need blood work while taking this medication. This medication may make you feel generally unwell. This is not uncommon as chemotherapy can affect healthy cells as well as cancer cells. Report any side effects. Continue your course of treatment even though you feel ill unless your care team tells you to stop. Other product types may be available that contain the medication azacitidine. The injection and oral products should not be used in place of one another. Talk to your care team if you have questions. This medication can cause serious side effects.  To reduce the risk, your care team may give you other medications to take before receiving this one. Be sure to follow the directions from your care team. This medication may increase your risk of getting an infection. Call your care team for advice if you get a fever, chills, sore throat, or other symptoms of a cold or  flu. Do not treat yourself. Try to avoid being around people who are sick. Avoid taking medications that contain aspirin, acetaminophen, ibuprofen, naproxen, or ketoprofen unless instructed by your care team. These medications may hide a fever. This medication may increase your risk to bruise or bleed. Call your care team if you notice any unusual bleeding. Be careful brushing or flossing your teeth or using a toothpick because you may get an infection or bleed more easily. If you have any dental work done, tell your dentist you are receiving this medication. Talk to your care team if you or your partner may be pregnant. Serious birth defects can occur if you take this medication during pregnancy and for 6 months after the last dose. You will need a negative pregnancy test before starting this medication. Contraception is recommended while taking his medication and for 6 months after the last dose. Your care team can help you find the option that works for you. If your partner can get pregnant, use a condom during sex while taking this medication and for 3 months after the last dose. Do not breastfeed while taking this medication and for 1 week after the last dose. This medication may cause infertility. Talk to your care team if you are concerned about your fertility. What side effects may I notice from receiving this medication? Side effects that you should report to your care team as soon as possible: Allergic reactions--skin rash, itching, hives, swelling of the face, lips, tongue, or throat Infection--fever, chills, cough, sore throat, wounds that don't heal, pain or trouble when passing urine, general feeling of discomfort or being unwell Kidney injury--decrease in the amount of urine, swelling of the ankles, hands, or feet Liver injury--right upper belly pain, loss of appetite, nausea, light-colored stool, dark yellow or brown urine, yellowing skin or eyes, unusual weakness or fatigue Low red  blood cell level--unusual weakness or fatigue, dizziness, headache, trouble breathing Tumor lysis syndrome (TLS)--nausea, vomiting, diarrhea, decrease in the amount of urine, dark urine, unusual weakness or fatigue, confusion, muscle pain or cramps, fast or irregular heartbeat, joint pain Unusual bruising or bleeding Side effects that usually do not require medical attention (report to your care team if they continue or are bothersome): Constipation Diarrhea Nausea Pain, redness, or irritation at injection site Vomiting This list may not describe all possible side effects. Call your doctor for medical advice about side effects. You may report side effects to FDA at 1-800-FDA-1088. Where should I keep my medication? This medication is given in a hospital or clinic. It will not be stored at home. NOTE: This sheet is a summary. It may not cover all possible information. If you have questions about this medicine, talk to your doctor, pharmacist, or health care provider.  2023 Elsevier/Gold Standard (2021-07-19 00:00:00)       To help prevent nausea and vomiting after your treatment, we encourage you to take your nausea medication as directed.  BELOW ARE SYMPTOMS THAT SHOULD BE REPORTED IMMEDIATELY: *FEVER GREATER THAN 100.4 F (38 C) OR HIGHER *CHILLS OR SWEATING *NAUSEA AND VOMITING THAT IS NOT CONTROLLED WITH YOUR NAUSEA MEDICATION *UNUSUAL SHORTNESS OF BREATH *UNUSUAL  BRUISING OR BLEEDING *URINARY PROBLEMS (pain or burning when urinating, or frequent urination) *BOWEL PROBLEMS (unusual diarrhea, constipation, pain near the anus) TENDERNESS IN MOUTH AND THROAT WITH OR WITHOUT PRESENCE OF ULCERS (sore throat, sores in mouth, or a toothache) UNUSUAL RASH, SWELLING OR PAIN  UNUSUAL VAGINAL DISCHARGE OR ITCHING   Items with * indicate a potential emergency and should be followed up as soon as possible or go to the Emergency Department if any problems should occur.  Please show the  CHEMOTHERAPY ALERT CARD or IMMUNOTHERAPY ALERT CARD at check-in to the Emergency Department and triage nurse.  Should you have questions after your visit or need to cancel or reschedule your appointment, please contact [facility] [phone]  and follow the prompts.  Office hours are 8:00 a.m. to 4:30 p.m. Monday - Friday. Please note that voicemails left after 4:00 p.m. may not be returned until the following business day.  We are closed weekends and major holidays. You have access to a nurse at all times for urgent questions. Please call the main number to the clinic [phone] and follow the prompts.  For any non-urgent questions, you may also contact your provider using MyChart. We now offer e-Visits for anyone 14 and older to request care online for non-urgent symptoms. For details visit [email or web address].   Also download the MyChart app! Go to the app store, search "MyChart", open the app, select [facility], and log in with your MyChart username and password.  Masks are optional in the cancer centers. If you would like for your care team to wear a mask while they are taking care of you, please let them know. You may have one support person who is at least 77 years old accompany you for your appointments.

## 2021-12-17 NOTE — Progress Notes (Signed)
Ok to treat without diff verbal order Dr. Delton Coombes.   Patient tolerated chemotherapy with no complaints voiced.  Side effects with management reviewed with understanding verbalized.  Port site clean and dry with no bruising or swelling noted at site.  Good blood return noted before and after administration of chemotherapy.  Band aid applied.  Patient left in satisfactory condition with VSS and no s/s of distress noted.

## 2021-12-18 ENCOUNTER — Inpatient Hospital Stay: Payer: Medicare HMO

## 2021-12-18 ENCOUNTER — Other Ambulatory Visit: Payer: Self-pay

## 2021-12-18 VITALS — BP 124/76 | HR 63 | Temp 97.5°F | Resp 18

## 2021-12-18 DIAGNOSIS — Z95828 Presence of other vascular implants and grafts: Secondary | ICD-10-CM

## 2021-12-18 DIAGNOSIS — D469 Myelodysplastic syndrome, unspecified: Secondary | ICD-10-CM

## 2021-12-18 DIAGNOSIS — Z5111 Encounter for antineoplastic chemotherapy: Secondary | ICD-10-CM | POA: Diagnosis not present

## 2021-12-18 MED ORDER — SODIUM CHLORIDE 0.9 % IV SOLN
10.0000 mg | Freq: Once | INTRAVENOUS | Status: AC
  Administered 2021-12-18: 10 mg via INTRAVENOUS
  Filled 2021-12-18: qty 1

## 2021-12-18 MED ORDER — SODIUM CHLORIDE 0.9% FLUSH
10.0000 mL | INTRAVENOUS | Status: DC | PRN
  Administered 2021-12-18: 10 mL

## 2021-12-18 MED ORDER — HEPARIN SOD (PORK) LOCK FLUSH 100 UNIT/ML IV SOLN
500.0000 [IU] | Freq: Once | INTRAVENOUS | Status: AC | PRN
  Administered 2021-12-18: 500 [IU]

## 2021-12-18 MED ORDER — SODIUM CHLORIDE 0.9 % IV SOLN
75.0000 mg/m2 | Freq: Once | INTRAVENOUS | Status: AC
  Administered 2021-12-18: 140 mg via INTRAVENOUS
  Filled 2021-12-18: qty 14

## 2021-12-18 MED ORDER — SODIUM CHLORIDE 0.9 % IV SOLN: Freq: Once | INTRAVENOUS | Status: AC

## 2021-12-18 NOTE — Patient Instructions (Signed)
[facility]  Discharge Instructions: Thank you for choosing [facility] to provide your oncology and hematology care.  If you have a lab appointment with the [facility], please come in thru the Main Entrance and check in at the main information desk.  Wear comfortable clothing and clothing appropriate for easy access to any Portacath or PICC line.   We strive to give you quality time with your provider. You may need to reschedule your appointment if you arrive late (15 or more minutes).  Arriving late affects you and other patients whose appointments are after yours.  Also, if you miss three or more appointments without notifying the office, you may be dismissed from the clinic at the provider's discretion.      For prescription refill requests, have your pharmacy contact our office and allow 72 hours for refills to be completed.    Today you received the following chemotherapy and/or immunotherapy agents Vidaza      To help prevent nausea and vomiting after your treatment, we encourage you to take your nausea medication as directed.  BELOW ARE SYMPTOMS THAT SHOULD BE REPORTED IMMEDIATELY: *FEVER GREATER THAN 100.4 F (38 C) OR HIGHER *CHILLS OR SWEATING *NAUSEA AND VOMITING THAT IS NOT CONTROLLED WITH YOUR NAUSEA MEDICATION *UNUSUAL SHORTNESS OF BREATH *UNUSUAL BRUISING OR BLEEDING *URINARY PROBLEMS (pain or burning when urinating, or frequent urination) *BOWEL PROBLEMS (unusual diarrhea, constipation, pain near the anus) TENDERNESS IN MOUTH AND THROAT WITH OR WITHOUT PRESENCE OF ULCERS (sore throat, sores in mouth, or a toothache) UNUSUAL RASH, SWELLING OR PAIN  UNUSUAL VAGINAL DISCHARGE OR ITCHING   Items with * indicate a potential emergency and should be followed up as soon as possible or go to the Emergency Department if any problems should occur.  Please show the CHEMOTHERAPY ALERT CARD or IMMUNOTHERAPY ALERT CARD at check-in to the Emergency  Department and triage nurse.  Should you have questions after your visit or need to cancel or reschedule your appointment, please contact [facility] [phone]  and follow the prompts.  Office hours are 8:00 a.m. to 4:30 p.m. Monday - Friday. Please note that voicemails left after 4:00 p.m. may not be returned until the following business day.  We are closed weekends and major holidays. You have access to a nurse at all times for urgent questions. Please call the main number to the clinic [phone] and follow the prompts.  For any non-urgent questions, you may also contact your provider using MyChart. We now offer e-Visits for anyone 66 and older to request care online for non-urgent symptoms. For details visit [email or web address].   Also download the MyChart app! Go to the app store, search "MyChart", open the app, select [facility], and log in with your MyChart username and password.  Masks are optional in the cancer centers. If you would like for your care team to wear a mask while they are taking care of you, please let them know. You may have one support person who is at least 77 years old accompany you for your appointments.

## 2021-12-18 NOTE — Progress Notes (Signed)
Treatment given per orders. Patient tolerated it well without problems. Vitals stable and discharged home from clinic ambulatory. Follow up as scheduled.

## 2021-12-19 ENCOUNTER — Inpatient Hospital Stay: Payer: Medicare HMO

## 2021-12-19 VITALS — BP 137/79 | HR 64 | Temp 97.9°F | Resp 18

## 2021-12-19 DIAGNOSIS — Z95828 Presence of other vascular implants and grafts: Secondary | ICD-10-CM

## 2021-12-19 DIAGNOSIS — D469 Myelodysplastic syndrome, unspecified: Secondary | ICD-10-CM

## 2021-12-19 DIAGNOSIS — Z5111 Encounter for antineoplastic chemotherapy: Secondary | ICD-10-CM | POA: Diagnosis not present

## 2021-12-19 MED ORDER — SODIUM CHLORIDE 0.9% FLUSH
10.0000 mL | INTRAVENOUS | Status: DC | PRN
  Administered 2021-12-19: 10 mL

## 2021-12-19 MED ORDER — PALONOSETRON HCL INJECTION 0.25 MG/5ML
0.2500 mg | Freq: Once | INTRAVENOUS | Status: AC
  Administered 2021-12-19: 0.25 mg via INTRAVENOUS
  Filled 2021-12-19: qty 5

## 2021-12-19 MED ORDER — SODIUM CHLORIDE 0.9 % IV SOLN
10.0000 mg | Freq: Once | INTRAVENOUS | Status: AC
  Administered 2021-12-19: 10 mg via INTRAVENOUS
  Filled 2021-12-19: qty 1

## 2021-12-19 MED ORDER — HEPARIN SOD (PORK) LOCK FLUSH 100 UNIT/ML IV SOLN
500.0000 [IU] | Freq: Once | INTRAVENOUS | Status: AC | PRN
  Administered 2021-12-19: 500 [IU]

## 2021-12-19 MED ORDER — SODIUM CHLORIDE 0.9 % IV SOLN: Freq: Once | INTRAVENOUS | Status: AC

## 2021-12-19 MED ORDER — SODIUM CHLORIDE 0.9 % IV SOLN
75.0000 mg/m2 | Freq: Once | INTRAVENOUS | Status: AC
  Administered 2021-12-19: 140 mg via INTRAVENOUS
  Filled 2021-12-19: qty 14

## 2021-12-19 NOTE — Progress Notes (Signed)
Patient tolerated chemotherapy with no complaints voiced. Side effects with management reviewed understanding verbalized. Port site clean and dry with no bruising or swelling noted at site. Good blood return noted before and after administration of chemotherapy. Band aid applied. Patient left in satisfactory condition with VSS and no s/s of distress noted.

## 2021-12-19 NOTE — Patient Instructions (Signed)
[facility]  Discharge Instructions: Thank you for choosing [facility] to provide your oncology and hematology care.  If you have a lab appointment with the [facility], please come in thru the Main Entrance and check in at the main information desk.  Wear comfortable clothing and clothing appropriate for easy access to any Portacath or PICC line.   We strive to give you quality time with your provider. You may need to reschedule your appointment if you arrive late (15 or more minutes).  Arriving late affects you and other patients whose appointments are after yours.  Also, if you miss three or more appointments without notifying the office, you may be dismissed from the clinic at the provider's discretion.      For prescription refill requests, have your pharmacy contact our office and allow 72 hours for refills to be completed.    Today you received the following chemotherapy and/or immunotherapy agents Vidaza, return as scheduled.   To help prevent nausea and vomiting after your treatment, we encourage you to take your nausea medication as directed.  BELOW ARE SYMPTOMS THAT SHOULD BE REPORTED IMMEDIATELY: *FEVER GREATER THAN 100.4 F (38 C) OR HIGHER *CHILLS OR SWEATING *NAUSEA AND VOMITING THAT IS NOT CONTROLLED WITH YOUR NAUSEA MEDICATION *UNUSUAL SHORTNESS OF BREATH *UNUSUAL BRUISING OR BLEEDING *URINARY PROBLEMS (pain or burning when urinating, or frequent urination) *BOWEL PROBLEMS (unusual diarrhea, constipation, pain near the anus) TENDERNESS IN MOUTH AND THROAT WITH OR WITHOUT PRESENCE OF ULCERS (sore throat, sores in mouth, or a toothache) UNUSUAL RASH, SWELLING OR PAIN  UNUSUAL VAGINAL DISCHARGE OR ITCHING   Items with * indicate a potential emergency and should be followed up as soon as possible or go to the Emergency Department if any problems should occur.  Please show the CHEMOTHERAPY ALERT CARD or IMMUNOTHERAPY ALERT CARD at  check-in to the Emergency Department and triage nurse.  Should you have questions after your visit or need to cancel or reschedule your appointment, please contact [facility] [phone]  and follow the prompts.  Office hours are 8:00 a.m. to 4:30 p.m. Monday - Friday. Please note that voicemails left after 4:00 p.m. may not be returned until the following business day.  We are closed weekends and major holidays. You have access to a nurse at all times for urgent questions. Please call the main number to the clinic [phone] and follow the prompts.  For any non-urgent questions, you may also contact your provider using MyChart. We now offer e-Visits for anyone 65 and older to request care online for non-urgent symptoms. For details visit [email or web address].   Also download the MyChart app! Go to the app store, search "MyChart", open the app, select [facility], and log in with your MyChart username and password.  Masks are optional in the cancer centers. If you would like for your care team to wear a mask while they are taking care of you, please let them know. You may have one support person who is at least 77 years old accompany you for your appointments.

## 2021-12-20 ENCOUNTER — Inpatient Hospital Stay: Payer: Medicare HMO

## 2021-12-20 VITALS — BP 124/72 | HR 62 | Temp 97.6°F | Resp 18

## 2021-12-20 DIAGNOSIS — Z5111 Encounter for antineoplastic chemotherapy: Secondary | ICD-10-CM | POA: Diagnosis not present

## 2021-12-20 DIAGNOSIS — D469 Myelodysplastic syndrome, unspecified: Secondary | ICD-10-CM

## 2021-12-20 DIAGNOSIS — Z95828 Presence of other vascular implants and grafts: Secondary | ICD-10-CM

## 2021-12-20 MED ORDER — SODIUM CHLORIDE 0.9 % IV SOLN
10.0000 mg | Freq: Once | INTRAVENOUS | Status: AC
  Administered 2021-12-20: 10 mg via INTRAVENOUS
  Filled 2021-12-20: qty 10

## 2021-12-20 MED ORDER — SODIUM CHLORIDE 0.9% FLUSH
10.0000 mL | INTRAVENOUS | Status: DC | PRN
  Administered 2021-12-20: 10 mL

## 2021-12-20 MED ORDER — ALTEPLASE 2 MG IJ SOLR: 2.0000 mg | Freq: Once | INTRAMUSCULAR | Status: DC | PRN

## 2021-12-20 MED ORDER — SODIUM CHLORIDE 0.9 % IV SOLN: Freq: Once | INTRAVENOUS | Status: AC

## 2021-12-20 MED ORDER — SODIUM CHLORIDE 0.9 % IV SOLN
75.0000 mg/m2 | Freq: Once | INTRAVENOUS | Status: AC
  Administered 2021-12-20: 140 mg via INTRAVENOUS
  Filled 2021-12-20: qty 14

## 2021-12-20 MED ORDER — HEPARIN SOD (PORK) LOCK FLUSH 100 UNIT/ML IV SOLN
500.0000 [IU] | Freq: Once | INTRAVENOUS | Status: AC | PRN
  Administered 2021-12-20: 500 [IU]

## 2021-12-20 NOTE — Patient Instructions (Signed)
[facility]  Discharge Instructions: Thank you for choosing [facility] to provide your oncology and hematology care.  If you have a lab appointment with the [facility], please come in thru the Main Entrance and check in at the main information desk.  Wear comfortable clothing and clothing appropriate for easy access to any Portacath or PICC line.   We strive to give you quality time with your provider. You may need to reschedule your appointment if you arrive late (15 or more minutes).  Arriving late affects you and other patients whose appointments are after yours.  Also, if you miss three or more appointments without notifying the office, you may be dismissed from the clinic at the provider's discretion.      For prescription refill requests, have your pharmacy contact our office and allow 72 hours for refills to be completed.    Today you received the following chemotherapy and/or immunotherapy agents Vidaza      To help prevent nausea and vomiting after your treatment, we encourage you to take your nausea medication as directed.  BELOW ARE SYMPTOMS THAT SHOULD BE REPORTED IMMEDIATELY: *FEVER GREATER THAN 100.4 F (38 C) OR HIGHER *CHILLS OR SWEATING *NAUSEA AND VOMITING THAT IS NOT CONTROLLED WITH YOUR NAUSEA MEDICATION *UNUSUAL SHORTNESS OF BREATH *UNUSUAL BRUISING OR BLEEDING *URINARY PROBLEMS (pain or burning when urinating, or frequent urination) *BOWEL PROBLEMS (unusual diarrhea, constipation, pain near the anus) TENDERNESS IN MOUTH AND THROAT WITH OR WITHOUT PRESENCE OF ULCERS (sore throat, sores in mouth, or a toothache) UNUSUAL RASH, SWELLING OR PAIN  UNUSUAL VAGINAL DISCHARGE OR ITCHING   Items with * indicate a potential emergency and should be followed up as soon as possible or go to the Emergency Department if any problems should occur.  Please show the CHEMOTHERAPY ALERT CARD or IMMUNOTHERAPY ALERT CARD at check-in to the Emergency  Department and triage nurse.  Should you have questions after your visit or need to cancel or reschedule your appointment, please contact [facility] [phone]  and follow the prompts.  Office hours are 8:00 a.m. to 4:30 p.m. Monday - Friday. Please note that voicemails left after 4:00 p.m. may not be returned until the following business day.  We are closed weekends and major holidays. You have access to a nurse at all times for urgent questions. Please call the main number to the clinic [phone] and follow the prompts.  For any non-urgent questions, you may also contact your provider using MyChart. We now offer e-Visits for anyone 62 and older to request care online for non-urgent symptoms. For details visit [email or web address].   Also download the MyChart app! Go to the app store, search "MyChart", open the app, select [facility], and log in with your MyChart username and password.  Masks are optional in the cancer centers. If you would like for your care team to wear a mask while they are taking care of you, please let them know. You may have one support person who is at least 77 years old accompany you for your appointments.

## 2021-12-21 ENCOUNTER — Inpatient Hospital Stay: Payer: Medicare HMO

## 2021-12-21 VITALS — BP 131/82 | HR 64 | Temp 97.5°F | Resp 18

## 2021-12-21 DIAGNOSIS — Z5111 Encounter for antineoplastic chemotherapy: Secondary | ICD-10-CM | POA: Diagnosis not present

## 2021-12-21 DIAGNOSIS — Z95828 Presence of other vascular implants and grafts: Secondary | ICD-10-CM

## 2021-12-21 DIAGNOSIS — D469 Myelodysplastic syndrome, unspecified: Secondary | ICD-10-CM

## 2021-12-21 MED ORDER — HEPARIN SOD (PORK) LOCK FLUSH 100 UNIT/ML IV SOLN
500.0000 [IU] | Freq: Once | INTRAVENOUS | Status: AC | PRN
  Administered 2021-12-21: 500 [IU]

## 2021-12-21 MED ORDER — SODIUM CHLORIDE 0.9% FLUSH
10.0000 mL | INTRAVENOUS | Status: DC | PRN
  Administered 2021-12-21: 10 mL

## 2021-12-21 MED ORDER — SODIUM CHLORIDE 0.9 % IV SOLN
10.0000 mg | Freq: Once | INTRAVENOUS | Status: AC
  Administered 2021-12-21: 10 mg via INTRAVENOUS
  Filled 2021-12-21: qty 1

## 2021-12-21 MED ORDER — SODIUM CHLORIDE 0.9 % IV SOLN
75.0000 mg/m2 | Freq: Once | INTRAVENOUS | Status: AC
  Administered 2021-12-21: 140 mg via INTRAVENOUS
  Filled 2021-12-21: qty 14

## 2021-12-21 MED ORDER — PALONOSETRON HCL INJECTION 0.25 MG/5ML
0.2500 mg | Freq: Once | INTRAVENOUS | Status: AC
  Administered 2021-12-21: 0.25 mg via INTRAVENOUS
  Filled 2021-12-21: qty 5

## 2021-12-21 MED ORDER — SODIUM CHLORIDE 0.9 % IV SOLN: Freq: Once | INTRAVENOUS | Status: AC

## 2021-12-21 NOTE — Progress Notes (Signed)
Patient presents today for Vidaza infusion.  Patient is in satisfactory condition with no complaints voiced.  Vital signs are stable.  We will proceed with treatment per MD orders.  Patient tolerated treatment well with no complaints voiced.  Patient left ambulatory in stable condition.  Vital signs stable at discharge.  Follow up as scheduled.

## 2021-12-21 NOTE — Patient Instructions (Signed)
[facility]  Discharge Instructions: Thank you for choosing [facility] to provide your oncology and hematology care.  If you have a lab appointment with the [facility], please come in thru the Main Entrance and check in at the main information desk.  Wear comfortable clothing and clothing appropriate for easy access to any Portacath or PICC line.   We strive to give you quality time with your provider. You may need to reschedule your appointment if you arrive late (15 or more minutes).  Arriving late affects you and other patients whose appointments are after yours.  Also, if you miss three or more appointments without notifying the office, you may be dismissed from the clinic at the provider's discretion.      For prescription refill requests, have your pharmacy contact our office and allow 72 hours for refills to be completed.    Today you received the following chemotherapy and/or immunotherapy agents Vidaza.  Azacitidine Injection What is this medication? AZACITIDINE (ay [place]) treats blood and bone marrow cancers. It works by slowing down the growth of cancer cells. This medicine may be used for other purposes; ask your health care provider or pharmacist if you have questions. COMMON BRAND NAME(S): Vidaza What should I tell my care team before I take this medication? They need to know if you have any of these conditions: Kidney disease Liver disease Low blood cell levels, such as low white cells, platelets, or red blood cells Low levels of albumin in the blood Low levels of bicarbonate in the blood An unusual or allergic reaction to azacitidine, mannitol, other medications, foods, dyes, or preservatives If you or your partner are pregnant or trying to get pregnant Breast-feeding How should I use this medication? This medication is injected into a vein or under the skin. It is given by your care team in a hospital or clinic  setting. Talk to your care team about the use of this medication in children. While it may be prescribed for children as young as 1 month for selected conditions, precautions do apply. Overdosage: If you think you have taken too much of this medicine contact a poison control center or emergency room at once. NOTE: This medicine is only for you. Do not share this medicine with others. What if I miss a dose? Keep appointments for follow-up doses. It is important not to miss your dose. Call your care team if you are unable to keep an appointment. What may interact with this medication? Interactions are not expected. This list may not describe all possible interactions. Give your health care provider a list of all the medicines, herbs, non-prescription drugs, or dietary supplements you use. Also tell them if you smoke, drink alcohol, or use illegal drugs. Some items may interact with your medicine. What should I watch for while using this medication? Your condition will be monitored carefully while you are receiving this medication. You may need blood work while taking this medication. This medication may make you feel generally unwell. This is not uncommon as chemotherapy can affect healthy cells as well as cancer cells. Report any side effects. Continue your course of treatment even though you feel ill unless your care team tells you to stop. Other product types may be available that contain the medication azacitidine. The injection and oral products should not be used in place of one another. Talk to your care team if you have questions. This medication can cause serious side  effects. To reduce the risk, your care team may give you other medications to take before receiving this one. Be sure to follow the directions from your care team. This medication may increase your risk of getting an infection. Call your care team for advice if you get a fever, chills, sore throat, or other symptoms of a cold or  flu. Do not treat yourself. Try to avoid being around people who are sick. Avoid taking medications that contain aspirin, acetaminophen, ibuprofen, naproxen, or ketoprofen unless instructed by your care team. These medications may hide a fever. This medication may increase your risk to bruise or bleed. Call your care team if you notice any unusual bleeding. Be careful brushing or flossing your teeth or using a toothpick because you may get an infection or bleed more easily. If you have any dental work done, tell your dentist you are receiving this medication. Talk to your care team if you or your partner may be pregnant. Serious birth defects can occur if you take this medication during pregnancy and for 6 months after the last dose. You will need a negative pregnancy test before starting this medication. Contraception is recommended while taking his medication and for 6 months after the last dose. Your care team can help you find the option that works for you. If your partner can get pregnant, use a condom during sex while taking this medication and for 3 months after the last dose. Do not breastfeed while taking this medication and for 1 week after the last dose. This medication may cause infertility. Talk to your care team if you are concerned about your fertility. What side effects may I notice from receiving this medication? Side effects that you should report to your care team as soon as possible: Allergic reactions--skin rash, itching, hives, swelling of the face, lips, tongue, or throat Infection--fever, chills, cough, sore throat, wounds that don't heal, pain or trouble when passing urine, general feeling of discomfort or being unwell Kidney injury--decrease in the amount of urine, swelling of the ankles, hands, or feet Liver injury--right upper belly pain, loss of appetite, nausea, light-colored stool, dark yellow or Zaylen Susman urine, yellowing skin or eyes, unusual weakness or fatigue Low red  blood cell level--unusual weakness or fatigue, dizziness, headache, trouble breathing Tumor lysis syndrome (TLS)--nausea, vomiting, diarrhea, decrease in the amount of urine, dark urine, unusual weakness or fatigue, confusion, muscle pain or cramps, fast or irregular heartbeat, joint pain Unusual bruising or bleeding Side effects that usually do not require medical attention (report to your care team if they continue or are bothersome): Constipation Diarrhea Nausea Pain, redness, or irritation at injection site Vomiting This list may not describe all possible side effects. Call your doctor for medical advice about side effects. You may report side effects to FDA at 1-800-FDA-1088. Where should I keep my medication? This medication is given in a hospital or clinic. It will not be stored at home. NOTE: This sheet is a summary. It may not cover all possible information. If you have questions about this medicine, talk to your doctor, pharmacist, or health care provider.  2023 Elsevier/Gold Standard (2021-07-19 00:00:00)        To help prevent nausea and vomiting after your treatment, we encourage you to take your nausea medication as directed.  BELOW ARE SYMPTOMS THAT SHOULD BE REPORTED IMMEDIATELY: *FEVER GREATER THAN 100.4 F (38 C) OR HIGHER *CHILLS OR SWEATING *NAUSEA AND VOMITING THAT IS NOT CONTROLLED WITH YOUR NAUSEA MEDICATION *UNUSUAL SHORTNESS OF  BREATH *UNUSUAL BRUISING OR BLEEDING *URINARY PROBLEMS (pain or burning when urinating, or frequent urination) *BOWEL PROBLEMS (unusual diarrhea, constipation, pain near the anus) TENDERNESS IN MOUTH AND THROAT WITH OR WITHOUT PRESENCE OF ULCERS (sore throat, sores in mouth, or a toothache) UNUSUAL RASH, SWELLING OR PAIN  UNUSUAL VAGINAL DISCHARGE OR ITCHING   Items with * indicate a potential emergency and should be followed up as soon as possible or go to the Emergency Department if any problems should occur.  Please show the  CHEMOTHERAPY ALERT CARD or IMMUNOTHERAPY ALERT CARD at check-in to the Emergency Department and triage nurse.  Should you have questions after your visit or need to cancel or reschedule your appointment, please contact [facility] [phone]  and follow the prompts.  Office hours are 8:00 a.m. to 4:30 p.m. Monday - Friday. Please note that voicemails left after 4:00 p.m. may not be returned until the following business day.  We are closed weekends and major holidays. You have access to a nurse at all times for urgent questions. Please call the main number to the clinic [phone] and follow the prompts.  For any non-urgent questions, you may also contact your provider using MyChart. We now offer e-Visits for anyone 14 and older to request care online for non-urgent symptoms. For details visit [email or web address].   Also download the MyChart app! Go to the app store, search "MyChart", open the app, select [facility], and log in with your MyChart username and password.  Masks are optional in the cancer centers. If you would like for your care team to wear a mask while they are taking care of you, please let them know. You may have one support person who is at least 77 years old accompany you for your appointments.

## 2021-12-24 ENCOUNTER — Inpatient Hospital Stay: Payer: Medicare HMO

## 2021-12-24 VITALS — BP 117/74 | HR 56 | Temp 97.8°F | Resp 18 | Ht 71.0 in | Wt 177.8 lb

## 2021-12-24 DIAGNOSIS — Z95828 Presence of other vascular implants and grafts: Secondary | ICD-10-CM

## 2021-12-24 DIAGNOSIS — D469 Myelodysplastic syndrome, unspecified: Secondary | ICD-10-CM

## 2021-12-24 DIAGNOSIS — Z5111 Encounter for antineoplastic chemotherapy: Secondary | ICD-10-CM | POA: Diagnosis not present

## 2021-12-24 MED ORDER — SODIUM CHLORIDE 0.9% FLUSH
10.0000 mL | INTRAVENOUS | Status: DC | PRN
  Administered 2021-12-24: 10 mL

## 2021-12-24 MED ORDER — HEPARIN SOD (PORK) LOCK FLUSH 100 UNIT/ML IV SOLN
500.0000 [IU] | Freq: Once | INTRAVENOUS | Status: AC | PRN
  Administered 2021-12-24: 500 [IU]

## 2021-12-24 MED ORDER — SODIUM CHLORIDE 0.9 % IV SOLN
75.0000 mg/m2 | Freq: Once | INTRAVENOUS | Status: AC
  Administered 2021-12-24: 140 mg via INTRAVENOUS
  Filled 2021-12-24: qty 14

## 2021-12-24 MED ORDER — PALONOSETRON HCL INJECTION 0.25 MG/5ML
0.2500 mg | Freq: Once | INTRAVENOUS | Status: AC
  Administered 2021-12-24: 0.25 mg via INTRAVENOUS
  Filled 2021-12-24: qty 5

## 2021-12-24 MED ORDER — SODIUM CHLORIDE 0.9 % IV SOLN
10.0000 mg | Freq: Once | INTRAVENOUS | Status: AC
  Administered 2021-12-24: 10 mg via INTRAVENOUS
  Filled 2021-12-24: qty 10

## 2021-12-24 MED ORDER — SODIUM CHLORIDE 0.9 % IV SOLN: Freq: Once | INTRAVENOUS | Status: AC

## 2021-12-24 NOTE — Patient Instructions (Signed)
[facility]  Discharge Instructions: Thank you for choosing [facility] to provide your oncology and hematology care.  If you have a lab appointment with the [facility], please come in thru the Main Entrance and check in at the main information desk.  Wear comfortable clothing and clothing appropriate for easy access to any Portacath or PICC line.   We strive to give you quality time with your provider. You may need to reschedule your appointment if you arrive late (15 or more minutes).  Arriving late affects you and other patients whose appointments are after yours.  Also, if you miss three or more appointments without notifying the office, you may be dismissed from the clinic at the provider's discretion.      For prescription refill requests, have your pharmacy contact our office and allow 72 hours for refills to be completed.    Today you received the following chemotherapy and/or immunotherapy agents Vidaza. Azacitidine Injection What is this medication? AZACITIDINE (ay Webb) treats blood and bone marrow cancers. It works by slowing down the growth of cancer cells. This medicine may be used for other purposes; ask your health care provider or pharmacist if you have questions. COMMON BRAND NAME(S): Vidaza What should I tell my care team before I take this medication? They need to know if you have any of these conditions: Kidney disease Liver disease Low blood cell levels, such as low white cells, platelets, or red blood cells Low levels of albumin in the blood Low levels of bicarbonate in the blood An unusual or allergic reaction to azacitidine, mannitol, other medications, foods, dyes, or preservatives If you or your partner are pregnant or trying to get pregnant Breast-feeding How should I use this medication? This medication is injected into a vein or under the skin. It is given by your care team in a hospital or clinic setting. Talk  to your care team about the use of this medication in children. While it may be prescribed for children as young as 1 month for selected conditions, precautions do apply. Overdosage: If you think you have taken too much of this medicine contact a poison control center or emergency room at once. NOTE: This medicine is only for you. Do not share this medicine with others. What if I miss a dose? Keep appointments for follow-up doses. It is important not to miss your dose. Call your care team if you are unable to keep an appointment. What may interact with this medication? Interactions are not expected. This list may not describe all possible interactions. Give your health care provider a list of all the medicines, herbs, non-prescription drugs, or dietary supplements you use. Also tell them if you smoke, drink alcohol, or use illegal drugs. Some items may interact with your medicine. What should I watch for while using this medication? Your condition will be monitored carefully while you are receiving this medication. You may need blood work while taking this medication. This medication may make you feel generally unwell. This is not uncommon as chemotherapy can affect healthy cells as well as cancer cells. Report any side effects. Continue your course of treatment even though you feel ill unless your care team tells you to stop. Other product types may be available that contain the medication azacitidine. The injection and oral products should not be used in place of one another. Talk to your care team if you have questions. This medication can cause serious side effects.  To reduce the risk, your care team may give you other medications to take before receiving this one. Be sure to follow the directions from your care team. This medication may increase your risk of getting an infection. Call your care team for advice if you get a fever, chills, sore throat, or other symptoms of a cold or flu. Do not treat  yourself. Try to avoid being around people who are sick. Avoid taking medications that contain aspirin, acetaminophen, ibuprofen, naproxen, or ketoprofen unless instructed by your care team. These medications may hide a fever. This medication may increase your risk to bruise or bleed. Call your care team if you notice any unusual bleeding. Be careful brushing or flossing your teeth or using a toothpick because you may get an infection or bleed more easily. If you have any dental work done, tell your dentist you are receiving this medication. Talk to your care team if you or your partner may be pregnant. Serious birth defects can occur if you take this medication during pregnancy and for 6 months after the last dose. You will need a negative pregnancy test before starting this medication. Contraception is recommended while taking his medication and for 6 months after the last dose. Your care team can help you find the option that works for you. If your partner can get pregnant, use a condom during sex while taking this medication and for 3 months after the last dose. Do not breastfeed while taking this medication and for 1 week after the last dose. This medication may cause infertility. Talk to your care team if you are concerned about your fertility. What side effects may I notice from receiving this medication? Side effects that you should report to your care team as soon as possible: Allergic reactions--skin rash, itching, hives, swelling of the face, lips, tongue, or throat Infection--fever, chills, cough, sore throat, wounds that don't heal, pain or trouble when passing urine, general feeling of discomfort or being unwell Kidney injury--decrease in the amount of urine, swelling of the ankles, hands, or feet Liver injury--right upper belly pain, loss of appetite, nausea, light-colored stool, dark yellow or brown urine, yellowing skin or eyes, unusual weakness or fatigue Low red blood cell  level--unusual weakness or fatigue, dizziness, headache, trouble breathing Tumor lysis syndrome (TLS)--nausea, vomiting, diarrhea, decrease in the amount of urine, dark urine, unusual weakness or fatigue, confusion, muscle pain or cramps, fast or irregular heartbeat, joint pain Unusual bruising or bleeding Side effects that usually do not require medical attention (report to your care team if they continue or are bothersome): Constipation Diarrhea Nausea Pain, redness, or irritation at injection site Vomiting This list may not describe all possible side effects. Call your doctor for medical advice about side effects. You may report side effects to FDA at 1-800-FDA-1088. Where should I keep my medication? This medication is given in a hospital or clinic. It will not be stored at home. NOTE: This sheet is a summary. It may not cover all possible information. If you have questions about this medicine, talk to your doctor, pharmacist, or health care provider.  2023 Elsevier/Gold Standard (2021-07-19 00:00:00)       To help prevent nausea and vomiting after your treatment, we encourage you to take your nausea medication as directed.  BELOW ARE SYMPTOMS THAT SHOULD BE REPORTED IMMEDIATELY: *FEVER GREATER THAN 100.4 F (38 C) OR HIGHER *CHILLS OR SWEATING *NAUSEA AND VOMITING THAT IS NOT CONTROLLED WITH YOUR NAUSEA MEDICATION *UNUSUAL SHORTNESS OF BREATH *UNUSUAL  BRUISING OR BLEEDING *URINARY PROBLEMS (pain or burning when urinating, or frequent urination) *BOWEL PROBLEMS (unusual diarrhea, constipation, pain near the anus) TENDERNESS IN MOUTH AND THROAT WITH OR WITHOUT PRESENCE OF ULCERS (sore throat, sores in mouth, or a toothache) UNUSUAL RASH, SWELLING OR PAIN  UNUSUAL VAGINAL DISCHARGE OR ITCHING   Items with * indicate a potential emergency and should be followed up as soon as possible or go to the Emergency Department if any problems should occur.  Please show the CHEMOTHERAPY ALERT  CARD or IMMUNOTHERAPY ALERT CARD at check-in to the Emergency Department and triage nurse.  Should you have questions after your visit or need to cancel or reschedule your appointment, please contact [facility] [phone]  and follow the prompts.  Office hours are 8:00 a.m. to 4:30 p.m. Monday - Friday. Please note that voicemails left after 4:00 p.m. may not be returned until the following business day.  We are closed weekends and major holidays. You have access to a nurse at all times for urgent questions. Please call the main number to the clinic [phone] and follow the prompts.  For any non-urgent questions, you may also contact your provider using MyChart. We now offer e-Visits for anyone 46 and older to request care online for non-urgent symptoms. For details visit [email or web address].   Also download the MyChart app! Go to the app store, search "MyChart", open the app, select [facility], and log in with your MyChart username and password.  Masks are optional in the cancer centers. If you would like for your care team to wear a mask while they are taking care of you, please let them know. You may have one support person who is at least 77 years old accompany you for your appointments.

## 2021-12-24 NOTE — Progress Notes (Signed)
Patient presents today for Vidaza D6. Vital signs stable. Patient has no complaints of any changes since his last treatment. MAR reviewed and updated.   Treatment given today per MD orders. Tolerated infusion without adverse affects. Vital signs stable. No complaints at this time. Discharged from clinic ambulatory in stable condition. Alert and oriented x 3. F/U with [facility] as scheduled.

## 2021-12-25 ENCOUNTER — Inpatient Hospital Stay: Payer: Medicare HMO

## 2021-12-25 VITALS — BP 135/79 | HR 65 | Temp 97.1°F | Resp 18 | Wt 157.8 lb

## 2021-12-25 DIAGNOSIS — Z5111 Encounter for antineoplastic chemotherapy: Secondary | ICD-10-CM | POA: Diagnosis not present

## 2021-12-25 DIAGNOSIS — D469 Myelodysplastic syndrome, unspecified: Secondary | ICD-10-CM

## 2021-12-25 DIAGNOSIS — Z95828 Presence of other vascular implants and grafts: Secondary | ICD-10-CM

## 2021-12-25 MED ORDER — SODIUM CHLORIDE 0.9 % IV SOLN
10.0000 mg | Freq: Once | INTRAVENOUS | Status: AC
  Administered 2021-12-25: 10 mg via INTRAVENOUS
  Filled 2021-12-25: qty 10

## 2021-12-25 MED ORDER — SODIUM CHLORIDE 0.9 % IV SOLN: Freq: Once | INTRAVENOUS | Status: AC

## 2021-12-25 MED ORDER — SODIUM CHLORIDE 0.9% FLUSH
10.0000 mL | INTRAVENOUS | Status: DC | PRN
  Administered 2021-12-25: 10 mL

## 2021-12-25 MED ORDER — HEPARIN SOD (PORK) LOCK FLUSH 100 UNIT/ML IV SOLN
500.0000 [IU] | Freq: Once | INTRAVENOUS | Status: AC | PRN
  Administered 2021-12-25: 500 [IU]

## 2021-12-25 MED ORDER — SODIUM CHLORIDE 0.9 % IV SOLN
75.0000 mg/m2 | Freq: Once | INTRAVENOUS | Status: AC
  Administered 2021-12-25: 140 mg via INTRAVENOUS
  Filled 2021-12-25: qty 14

## 2021-12-25 NOTE — Patient Instructions (Signed)
[facility]  Discharge Instructions: Thank you for choosing [facility] to provide your oncology and hematology care.  If you have a lab appointment with the [facility], please come in thru the Main Entrance and check in at the main information desk.  Wear comfortable clothing and clothing appropriate for easy access to any Portacath or PICC line.   We strive to give you quality time with your provider. You may need to reschedule your appointment if you arrive late (15 or more minutes).  Arriving late affects you and other patients whose appointments are after yours.  Also, if you miss three or more appointments without notifying the office, you may be dismissed from the clinic at the provider's discretion.      For prescription refill requests, have your pharmacy contact our office and allow 72 hours for refills to be completed.    Today you received the following chemotherapy and/or immunotherapy agents Vidaza      To help prevent nausea and vomiting after your treatment, we encourage you to take your nausea medication as directed.  BELOW ARE SYMPTOMS THAT SHOULD BE REPORTED IMMEDIATELY: *FEVER GREATER THAN 100.4 F (38 C) OR HIGHER *CHILLS OR SWEATING *NAUSEA AND VOMITING THAT IS NOT CONTROLLED WITH YOUR NAUSEA MEDICATION *UNUSUAL SHORTNESS OF BREATH *UNUSUAL BRUISING OR BLEEDING *URINARY PROBLEMS (pain or burning when urinating, or frequent urination) *BOWEL PROBLEMS (unusual diarrhea, constipation, pain near the anus) TENDERNESS IN MOUTH AND THROAT WITH OR WITHOUT PRESENCE OF ULCERS (sore throat, sores in mouth, or a toothache) UNUSUAL RASH, SWELLING OR PAIN  UNUSUAL VAGINAL DISCHARGE OR ITCHING   Items with * indicate a potential emergency and should be followed up as soon as possible or go to the Emergency Department if any problems should occur.  Please show the CHEMOTHERAPY ALERT CARD or IMMUNOTHERAPY ALERT CARD at check-in to the Emergency  Department and triage nurse.  Should you have questions after your visit or need to cancel or reschedule your appointment, please contact [facility] [phone]  and follow the prompts.  Office hours are 8:00 a.m. to 4:30 p.m. Monday - Friday. Please note that voicemails left after 4:00 p.m. may not be returned until the following business day.  We are closed weekends and major holidays. You have access to a nurse at all times for urgent questions. Please call the main number to the clinic [phone] and follow the prompts.  For any non-urgent questions, you may also contact your provider using MyChart. We now offer e-Visits for anyone 70 and older to request care online for non-urgent symptoms. For details visit [email or web address].   Also download the MyChart app! Go to the app store, search "MyChart", open the app, select [facility], and log in with your MyChart username and password.  Masks are optional in the cancer centers. If you would like for your care team to wear a mask while they are taking care of you, please let them know. You may have one support person who is at least 77 years old accompany you for your appointments.

## 2021-12-25 NOTE — Progress Notes (Signed)
Patient presents today for Vidaza infusion per providers order.  Vital signs within parameters for treatment.  Patient has no new complaints at this time.  Treatment given today per MD orders.  Stable during infusion without adverse affects.  Vital signs stable.  No complaints at this time.  Discharge from clinic ambulatory in stable condition.  Alert and oriented X 3.  Follow up with [facility] as scheduled.

## 2022-01-03 ENCOUNTER — Other Ambulatory Visit (HOSPITAL_COMMUNITY): Payer: Self-pay | Admitting: Student

## 2022-01-03 DIAGNOSIS — D696 Thrombocytopenia, unspecified: Secondary | ICD-10-CM

## 2022-01-03 NOTE — H&P (Signed)
Chief Complaint: Patient was seen in consultation today for myelodysplastic syndrome  Referring Physician(s): Katragadda,Sreedhar  Supervising Physician: Mir, Sharen Heck  Patient Status: [facility] - Out-pt  History of Present Illness: [facility] is a 76 y.o. male with past medical history of DVT, GERD, HLD, HTN currently undergoing treatment for myelodysplastic syndrome.  He has completed recent round of chemotherapy. Request made for bone marrow biopsy to assess treatment response.    Patient presents to [facility] today in his usual state of health.  He has been NPO.  He is aware of the goals of the procedure today and is agreeable to proceed.  He has transportation and care assistance today with his wife.    Past Medical History:  Diagnosis Date   Allergy    Anxiety    Arthritis    Asthma    Cataract    Clotting disorder ([facility])    DVT (deep venous thrombosis) (HCC)    unknown etiology. 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   Pneumonia    Port-A-Cath in place 07/30/2021    Past Surgical History:  Procedure Laterality Date   CATARACT EXTRACTION W/PHACO Right 11/24/2017   Procedure: CATARACT EXTRACTION PHACO AND INTRAOCULAR LENS PLACEMENT RIGHT EYE;  Surgeon: [name], MD;  Location: [facility];  Service: Ophthalmology;  Laterality: Right;  CDE: 8.61   CATARACT EXTRACTION W/PHACO Left 12/29/2017   Procedure: CATARACT EXTRACTION PHACO AND INTRAOCULAR LENS PLACEMENT (IOC);  Surgeon: [name], MD;  Location: [facility];  Service: Ophthalmology;  Laterality: Left;  CDE: 7.21   GANGLION CYST EXCISION     HERNIA REPAIR     bilateral   PARS PLANA VITRECTOMY Left 05/17/2021   Procedure: PARS PLANA VITRECTOMY 25 GAUGE WITH ANTIBIOTIC INJECTION  AND ENDOLASER LEFT EYE;  Surgeon: [name], MD;  Location: [facility];  Service:  Ophthalmology;  Laterality: Left;   PORTACATH PLACEMENT Right 07/09/2021   Procedure: INSERTION PORT-A-CATH;  Surgeon: Aviva Signs, MD;  Location: [facility];  Service: General;  Laterality: Right;   TONSILLECTOMY     VASCULAR SURGERY      Allergies: Cephalosporins, Amoxapine and related, Amoxicillin, Cephalexin, Clindamycin/lincomycin, Sulfamethoxazole, and Trimethoprim  Medications: Prior to Admission medications   Medication Sig Start Date End Date Taking? Authorizing Provider  albuterol (PROVENTIL HFA;VENTOLIN HFA) 108 (90 Base) MCG/ACT inhaler Inhale 1-2 puffs into the lungs every 6 (six) hours as needed for wheezing or shortness of breath.    [provider]  ALPRAZolam Duanne Moron) 0.5 MG tablet Take 0.5 mg by mouth 2 (two) times daily as needed for anxiety.    [provider]  amLODipine (NORVASC) 2.5 MG tablet Take 2.5 mg by mouth daily. 12/06/20   [provider]  azaCITIDine 5 mg/2 mLs in lactated ringers infusion Inject into the vein daily. Days 1-7 every 28 days 07/30/21   [provider]  fluticasone furoate-vilanterol (BREO ELLIPTA) 200-25 MCG/ACT AEPB Inhale 1 puff into the lungs daily.    [provider]  Lidocaine-Hydrocort, Perianal, 3-0.5 % CREA Apply topically 2 (two) times daily as needed. 11/13/21   [provider]  Lidocaine-Hydrocortisone Ace 3-0.5 % CREA Apply 1 application topically as directed. Patient taking differently: Apply 1 application  topically 2 (two) times daily as needed (hemorrhoids). 08/19/17   [name], MD  loratadine (CLARITIN) 10  MG tablet Take 10 mg by mouth daily. 01/17/21   [provider]  Lutein 40 MG CAPS Take 40 mg by mouth daily.    [provider]  Multiple Vitamins-Minerals (CENTRUM ADULTS PO) Take 1 tablet by mouth daily.    [provider]  nystatin cream (MYCOSTATIN) Apply 1 application. topically 2 (two) times daily as needed for dry skin.    [provider]  ofloxacin (OCUFLOX) 0.3 % ophthalmic solution Place 1 drop into the left eye See admin instructions. 1 drop to left eye 4 x daily for 7 days after monthly procedure.    [provider]  omeprazole (PRILOSEC OTC) 20 MG tablet Take 5-10 mg by mouth daily.    [provider]  sildenafil (VIAGRA) 50 MG tablet Take 50 mg by mouth daily as needed for erectile dysfunction.    [provider]  traMADol (ULTRAM) 50 MG tablet Take 1 tablet (50 mg total) by mouth every 6 (six) hours as needed. 07/09/21   Aviva Signs, MD  prochlorperazine (COMPAZINE) 10 MG tablet Take 1 tablet (10 mg total) by mouth every 6 (six) hours as needed (Nausea or vomiting). 07/30/21 11/19/21  [name], MD     Family History  Problem Relation Age of Onset   Cancer Mother        pancreatic   Arthritis Father    Early death Father 32       Lung infiltrate   Colon cancer Neg Hx    Colon polyps Neg Hx     Social History   Socioeconomic History   Marital status: Married    Spouse name: [name]   Number of children: 0   Years of education: 37   Highest education level: High school graduate  Occupational History   Not on file  Tobacco Use   Smoking status: Former    Types: Pipe    Quit date: 03/21/1966    Years since quitting: 55.8   Smokeless tobacco: Never  Vaping Use   Vaping Use: Never used  Substance and Sexual Activity   Alcohol use: Yes    Alcohol/week: 1.0 standard drink of alcohol    Types: 1 Cans of beer per week    Comment: 1 can of beer a week   Drug use: Never   Sexual activity: Not Currently  Other Topics Concern   Not on file  Social History Narrative   ** Merged History Encounter **       Grew up in [place], [place] and [place].  Retired.  Worked in [place]. 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    Social Determinants of Health   Financial Resource Strain: Low Risk  (01/23/2017)   Overall Financial  Resource Strain (CARDIA)    Difficulty of Paying Living Expenses: Not hard at all  Food Insecurity: No Food Insecurity (01/23/2017)   Hunger Vital Sign    Worried About Running Out of Food in the Last Year: Never true    Ran Out of Food in the Last Year: Never true  Transportation Needs: No Transportation Needs (01/23/2017)   PRAPARE - Hydrologist (Medical): No    Lack of Transportation (Non-Medical): No  Physical Activity: Unknown (01/23/2017)   Exercise Vital Sign    Days of Exercise per Week: 3 days    Minutes of Exercise per Session: Not on file  Stress: Stress Concern Present (01/23/2017)   [employer]  of Occupational Health - Occupational Stress Questionnaire    Feeling of Stress : To some extent  Social Connections: Somewhat Isolated (01/23/2017)   Social Connection and Isolation Panel [NHANES]    Frequency of Communication with Friends and Family: More than three times a week    Frequency of Social Gatherings with Friends and Family: More than three times a week    Attends Religious Services: Never    Marine scientist or Organizations: No    Attends Archivist Meetings: Never    Marital Status: Married     Review of Systems: A 12 point ROS discussed and pertinent positives are indicated in the HPI above.  All other systems are negative.  Review of Systems  Constitutional:  Negative for fatigue and fever.  Respiratory:  Negative for cough and shortness of breath.   Cardiovascular:  Negative for chest pain.  Gastrointestinal:  Negative for abdominal pain.  Musculoskeletal:  Negative for back pain.  Psychiatric/Behavioral:  Negative for behavioral problems and confusion.     Vital Signs: BP (!) 130/91   Pulse 60   Temp (!) 97.5 F (36.4 C) (Oral)   Resp 16   Ht 5' 11" (1.803 m)   Wt 156 lb 8.4 oz (71 kg)   SpO2 98%   BMI 21.83 kg/m   Physical Exam Vitals and nursing note reviewed.  Constitutional:      General:  He is not in acute distress.    Appearance: Normal appearance. He is not ill-appearing.  HENT:     Mouth/Throat:     Mouth: Mucous membranes are moist.     Pharynx: Oropharynx is clear.  Cardiovascular:     Rate and Rhythm: Normal rate and regular rhythm.  Pulmonary:     Effort: Pulmonary effort is normal.     Breath sounds: Normal breath sounds.  Abdominal:     General: Abdomen is flat.     Palpations: Abdomen is soft.  Neurological:     General: No focal deficit present.     Mental Status: He is alert and oriented to person, place, and time. Mental status is at baseline.  Psychiatric:        Mood and Affect: Mood normal.        Behavior: Behavior normal.        Thought Content: Thought content normal.        Judgment: Judgment normal.      MD Evaluation Airway: WNL Heart: WNL Abdomen: WNL Chest/ Lungs: WNL ASA  Classification: 3 Mallampati/Airway Score: Two   Imaging: No results found.  Labs:  CBC: Recent Labs    10/22/21 1227 11/20/21 0934 12/17/21 0912 01/04/22 0759  WBC 1.9* 2.2* 2.0* 1.8*  HGB 10.9* 11.5* 11.4* 11.1*  HCT 32.6* 34.1* 34.0* 33.2*  PLT 153 155 108* 96*    COAGS: No results for input(s): "INR", "APTT" in the last 8760 hours.  BMP: Recent Labs    10/16/21 1457 10/22/21 1227 11/20/21 0933 12/17/21 0912  NA 140 137 140 138  K 3.8 3.5 3.7 3.5  CL 109 108 108 107  CO2 _0 GLUCOSE 103* 105* 107* 104*  BUN _1 CALCIUM 9.3 9.1 9.3 9.2  CREATININE 0.87 0.87 0.85 0.95  GFRNONAA >60 >60 >60 >60    LIVER FUNCTION TESTS: Recent Labs    10/16/21 1457 10/22/21 1227 11/20/21 0933 12/17/21 0912  BILITOT 1.5* 1.3* 1.7* 1.9*  AST _2 17  ALT _0 ALKPHOS 62 62 66 62  PROT 6.2* 6.3* 6.2* 6.1*  ALBUMIN 3.7 3.9 3.8 3.8    TUMOR MARKERS: No results for input(s): "AFPTM", "CEA", "CA199", "CHROMGRNA" in the last 8760 hours.  Assessment and Plan: Patient with past medical history of myelodysplastic  syndrome presents for bone marrow biopsy for assessment of treatment response.   Patient presents today in their usual state of health.  He has been NPO and is not currently on blood thinners.   Risks and benefits of bone marrow biopsy was discussed with the patient and/or patient's family including, but not limited to bleeding, infection, damage to adjacent structures or low yield requiring additional tests.  All of the questions were answered and there is agreement to proceed.  Consent signed and in chart.  Thank you for this interesting consult.  I greatly enjoyed meeting NICHOLSON STARACE and look forward to participating in their care.  A copy of this report was sent to the requesting provider on this date.  Electronically Signed: Docia Barrier, PA 01/04/2022, 8:33 AM   I spent a total of  30 Minutes   in face to face in clinical consultation, greater than 50% of which was counseling/coordinating care for myelodysplastic syndrome.

## 2022-01-04 ENCOUNTER — Ambulatory Visit (HOSPITAL_COMMUNITY)
Admission: RE | Admit: 2022-01-04 | Discharge: 2022-01-04 | Disposition: A | Discharge status: Home / Self Care | Payer: Medicare HMO | Source: Ambulatory Visit | Attending: Hematology | Admitting: Hematology

## 2022-01-04 ENCOUNTER — Encounter (HOSPITAL_COMMUNITY): Payer: Self-pay

## 2022-01-04 ENCOUNTER — Other Ambulatory Visit: Payer: Self-pay

## 2022-01-04 DIAGNOSIS — E785 Hyperlipidemia, unspecified: Secondary | ICD-10-CM | POA: Insufficient documentation

## 2022-01-04 DIAGNOSIS — K219 Gastro-esophageal reflux disease without esophagitis: Secondary | ICD-10-CM | POA: Diagnosis not present

## 2022-01-04 DIAGNOSIS — Z9221 Personal history of antineoplastic chemotherapy: Secondary | ICD-10-CM | POA: Diagnosis not present

## 2022-01-04 DIAGNOSIS — D469 Myelodysplastic syndrome, unspecified: Secondary | ICD-10-CM | POA: Insufficient documentation

## 2022-01-04 DIAGNOSIS — D696 Thrombocytopenia, unspecified: Secondary | ICD-10-CM

## 2022-01-04 DIAGNOSIS — D649 Anemia, unspecified: Secondary | ICD-10-CM | POA: Insufficient documentation

## 2022-01-04 DIAGNOSIS — Z86718 Personal history of other venous thrombosis and embolism: Secondary | ICD-10-CM | POA: Diagnosis not present

## 2022-01-04 DIAGNOSIS — D61818 Other pancytopenia: Secondary | ICD-10-CM | POA: Diagnosis not present

## 2022-01-04 DIAGNOSIS — I10 Essential (primary) hypertension: Secondary | ICD-10-CM | POA: Diagnosis not present

## 2022-01-04 LAB — CBC WITH DIFFERENTIAL/PLATELET
Abs Immature Granulocytes: 0 10*3/uL (ref 0.00–0.07)
Basophils Absolute: 0 10*3/uL (ref 0.0–0.1)
Basophils Relative: 1 %
Eosinophils Absolute: 0.2 10*3/uL (ref 0.0–0.5)
Eosinophils Relative: 9 %
HCT: 33.2 % — ABNORMAL LOW (ref 39.0–52.0)
Hemoglobin: 11.1 g/dL — ABNORMAL LOW (ref 13.0–17.0)
Immature Granulocytes: 0 %
Lymphocytes Relative: 63 %
Lymphs Abs: 1.2 10*3/uL (ref 0.7–4.0)
MCH: 31.1 pg (ref 26.0–34.0)
MCHC: 33.4 g/dL (ref 30.0–36.0)
MCV: 93 fL (ref 80.0–100.0)
Monocytes Absolute: 0.1 10*3/uL (ref 0.1–1.0)
Monocytes Relative: 7 %
Neutro Abs: 0.4 10*3/uL — CL (ref 1.7–7.7)
Neutrophils Relative %: 20 %
Platelets: 96 10*3/uL — ABNORMAL LOW (ref 150–400)
RBC: 3.57 MIL/uL — ABNORMAL LOW (ref 4.22–5.81)
RDW: 16.5 % — ABNORMAL HIGH (ref 11.5–15.5)
WBC: 1.8 10*3/uL — ABNORMAL LOW (ref 4.0–10.5)
nRBC: 0 % (ref 0.0–0.2)

## 2022-01-04 MED ORDER — SODIUM CHLORIDE 0.9 % IV SOLN: INTRAVENOUS | Status: DC

## 2022-01-04 MED ORDER — FENTANYL CITRATE (PF) 100 MCG/2ML IJ SOLN
INTRAMUSCULAR | Status: AC | PRN
  Administered 2022-01-04 (×2): 50 ug via INTRAVENOUS

## 2022-01-04 MED ORDER — FENTANYL CITRATE (PF) 100 MCG/2ML IJ SOLN
INTRAMUSCULAR | Status: AC
  Filled 2022-01-04: qty 4

## 2022-01-04 MED ORDER — MIDAZOLAM HCL 2 MG/2ML IJ SOLN
INTRAMUSCULAR | Status: AC | PRN
  Administered 2022-01-04 (×2): 1 mg via INTRAVENOUS

## 2022-01-04 MED ORDER — MIDAZOLAM HCL 2 MG/2ML IJ SOLN
INTRAMUSCULAR | Status: AC
  Filled 2022-01-04: qty 4

## 2022-01-04 NOTE — Discharge Instructions (Signed)
Please call Interventional Radiology clinic [phone] with any questions or concerns.  You may remove your dressing and shower tomorrow.    Bone Marrow Aspiration and Bone Marrow Biopsy, Adult, Care After This sheet gives you information about how to care for yourself after your procedure. Your health care provider may also give you more specific instructions. If you have problems or questions, contact your health careprovider. What can I expect after the procedure? After the procedure, it is common to have: Mild pain and tenderness. Swelling. Bruising. Follow these instructions at home: Puncture site care Follow instructions from your health care provider about how to take care of the puncture site. Make sure you: Wash your hands with soap and water before and after you change your bandage (dressing). If soap and water are not available, use hand sanitizer. Change your dressing as told by your health care provider. Check your puncture site every day for signs of infection. Check for: More redness, swelling, or pain. Fluid or blood. Warmth. Pus or a bad smell.  Activity Return to your normal activities as told by your health care provider. Ask your health care provider what activities are safe for you. Do not lift anything that is heavier than 10 lb (4.5 kg), or the limit that you are told, until your health care provider says that it is safe. Do not drive for 24 hours if you were given a sedative during your procedure. General instructions Take over-the-counter and prescription medicines only as told by your health care provider. Do not take baths, swim, or use a hot tub until your health care provider approves. Ask your health care provider if you may take showers. You may only be allowed to take sponge baths. If directed, put ice on the affected area. To do this: Put ice in a plastic bag. Place a towel between your skin and the bag. Leave the ice on for 20 minutes, 2-3 times a  day. Keep all follow-up visits as told by your health care provider. This is important.  Contact a health care provider if: Your pain is not controlled with medicine. You have a fever. You have more redness, swelling, or pain around the puncture site. You have fluid or blood coming from the puncture site. Your puncture site feels warm to the touch. You have pus or a bad smell coming from the puncture site. Summary After the procedure, it is common to have mild pain, tenderness, swelling, and bruising. Follow instructions from your health care provider about how to take care of the puncture site and what activities are safe for you. Take over-the-counter and prescription medicines only as told by your health care provider. Contact a health care provider if you have any signs of infection, such as fluid or blood coming from the puncture site. This information is not intended to replace advice given to you by your health care provider. Make sure you discuss any questions you have with your healthcare provider. Document Revised: 07/21/2018 Document Reviewed: 07/21/2018 Elsevier Patient Education  [place].    Moderate Conscious Sedation, Adult, Care After This sheet gives you information about how to care for yourself after your procedure. Your health care provider may also give you more specific instructions. If you have problems or questions, contact your health careprovider. What can I expect after the procedure? After the procedure, it is common to have: Sleepiness for several hours. Impaired judgment for several hours. Difficulty with balance. Vomiting if you eat too soon. Follow these  instructions at home: For the time period you were told by your health care provider: Rest. Do not participate in activities where you could fall or become injured. Do not drive or use machinery. Do not drink alcohol. Do not take sleeping pills or medicines that cause drowsiness. Do not  make important decisions or sign legal documents. Do not take care of children on your own. Eating and drinking  Follow the diet recommended by your health care provider. Drink enough fluid to keep your urine pale yellow. If you vomit: Drink water, juice, or soup when you can drink without vomiting. Make sure you have little or no nausea before eating solid foods.  General instructions Take over-the-counter and prescription medicines only as told by your health care provider. Have a responsible adult stay with you for the time you are told. It is important to have someone help care for you until you are awake and alert. Do not smoke. Keep all follow-up visits as told by your health care provider. This is important. Contact a health care provider if: You are still sleepy or having trouble with balance after 24 hours. You feel light-headed. You keep feeling nauseous or you keep vomiting. You develop a rash. You have a fever. You have redness or swelling around the IV site. Get help right away if: You have trouble breathing. You have new-onset confusion at home. Summary After the procedure, it is common to feel sleepy, have impaired judgment, or feel nauseous if you eat too soon. Rest after you get home. Know the things you should not do after the procedure. Follow the diet recommended by your health care provider and drink enough fluid to keep your urine pale yellow. Get help right away if you have trouble breathing or new-onset confusion at home. This information is not intended to replace advice given to you by your health care provider. Make sure you discuss any questions you have with your healthcare provider. Document Revised: 07/02/2019 Document Reviewed: 01/28/2019 Elsevier Patient Education  2022 [employer].

## 2022-01-04 NOTE — Progress Notes (Signed)
CRITICAL VALUE STICKER  CRITICAL VALUE: neut # at 0.4  RECEIVER (on-site recipient of call): Otila Back, RN  DATE & TIME NOTIFIED: 8:34  MESSENGER (representative from lab): [facility]  MD NOTIFIED: Dr. Dwaine Gale   TIME OF NOTIFICATION: 8:38  RESPONSE:  No new orders. Value known, why patient is recv trmt.

## 2022-01-04 NOTE — Procedures (Signed)
Interventional Radiology Procedure Note  Indication: Myelodysplastic Syndrome  Procedure: CT guided aspirate and core biopsy of right iliac bone  Complications: None  Bleeding: Minimal  Miachel Roux, MD [phone]

## 2022-01-09 LAB — SURGICAL PATHOLOGY

## 2022-01-11 ENCOUNTER — Encounter (HOSPITAL_COMMUNITY): Payer: Self-pay | Admitting: Hematology

## 2022-01-14 ENCOUNTER — Encounter (HOSPITAL_COMMUNITY): Payer: Self-pay | Admitting: Hematology

## 2022-01-18 ENCOUNTER — Other Ambulatory Visit: Payer: Self-pay

## 2022-01-18 DIAGNOSIS — D469 Myelodysplastic syndrome, unspecified: Secondary | ICD-10-CM

## 2022-01-21 ENCOUNTER — Inpatient Hospital Stay: Payer: Medicare HMO | Attending: Hematology

## 2022-01-21 ENCOUNTER — Inpatient Hospital Stay: Payer: Medicare HMO

## 2022-01-21 ENCOUNTER — Inpatient Hospital Stay (HOSPITAL_BASED_OUTPATIENT_CLINIC_OR_DEPARTMENT_OTHER): Payer: Medicare HMO | Admitting: Hematology

## 2022-01-21 VITALS — BP 120/81 | HR 58 | Temp 97.3°F | Resp 18

## 2022-01-21 DIAGNOSIS — Z5111 Encounter for antineoplastic chemotherapy: Secondary | ICD-10-CM | POA: Diagnosis not present

## 2022-01-21 DIAGNOSIS — D469 Myelodysplastic syndrome, unspecified: Secondary | ICD-10-CM

## 2022-01-21 DIAGNOSIS — D4622 Refractory anemia with excess of blasts 2: Secondary | ICD-10-CM | POA: Insufficient documentation

## 2022-01-21 DIAGNOSIS — Z95828 Presence of other vascular implants and grafts: Secondary | ICD-10-CM

## 2022-01-21 LAB — CBC WITH DIFFERENTIAL/PLATELET
Abs Immature Granulocytes: 0.02 10*3/uL (ref 0.00–0.07)
Basophils Absolute: 0 10*3/uL (ref 0.0–0.1)
Basophils Relative: 1 %
Eosinophils Absolute: 0.1 10*3/uL (ref 0.0–0.5)
Eosinophils Relative: 3 %
HCT: 32.7 % — ABNORMAL LOW (ref 39.0–52.0)
Hemoglobin: 11.1 g/dL — ABNORMAL LOW (ref 13.0–17.0)
Immature Granulocytes: 1 %
Lymphocytes Relative: 53 %
Lymphs Abs: 1.1 10*3/uL (ref 0.7–4.0)
MCH: 31.2 pg (ref 26.0–34.0)
MCHC: 33.9 g/dL (ref 30.0–36.0)
MCV: 91.9 fL (ref 80.0–100.0)
Monocytes Absolute: 0.1 10*3/uL (ref 0.1–1.0)
Monocytes Relative: 5 %
Neutro Abs: 0.8 10*3/uL — ABNORMAL LOW (ref 1.7–7.7)
Neutrophils Relative %: 37 %
Platelets: 147 10*3/uL — ABNORMAL LOW (ref 150–400)
RBC: 3.56 MIL/uL — ABNORMAL LOW (ref 4.22–5.81)
RDW: 16 % — ABNORMAL HIGH (ref 11.5–15.5)
WBC: 2.1 10*3/uL — ABNORMAL LOW (ref 4.0–10.5)
nRBC: 0 % (ref 0.0–0.2)

## 2022-01-21 LAB — COMPREHENSIVE METABOLIC PANEL
ALT: 12 U/L (ref 0–44)
AST: 16 U/L (ref 15–41)
Albumin: 3.8 g/dL (ref 3.5–5.0)
Alkaline Phosphatase: 80 U/L (ref 38–126)
Anion gap: 6 (ref 5–15)
BUN: 13 mg/dL (ref 8–23)
CO2: 27 mmol/L (ref 22–32)
Calcium: 9.2 mg/dL (ref 8.9–10.3)
Chloride: 105 mmol/L (ref 98–111)
Creatinine, Ser: 0.82 mg/dL (ref 0.61–1.24)
GFR, Estimated: >60 mL/min (ref >60)
Glucose, Bld: 99 mg/dL (ref 70–99)
Potassium: 3.8 mmol/L (ref 3.5–5.1)
Sodium: 138 mmol/L (ref 135–145)
Total Bilirubin: 1.1 mg/dL (ref 0.3–1.2)
Total Protein: 6.3 g/dL — ABNORMAL LOW (ref 6.5–8.1)

## 2022-01-21 LAB — MAGNESIUM: Magnesium: 2.1 mg/dL (ref 1.7–2.4)

## 2022-01-21 MED ORDER — PALONOSETRON HCL INJECTION 0.25 MG/5ML
0.2500 mg | Freq: Once | INTRAVENOUS | Status: AC
  Administered 2022-01-21: 0.25 mg via INTRAVENOUS
  Filled 2022-01-21: qty 5

## 2022-01-21 MED ORDER — HEPARIN SOD (PORK) LOCK FLUSH 100 UNIT/ML IV SOLN
500.0000 [IU] | Freq: Once | INTRAVENOUS | Status: AC | PRN
  Administered 2022-01-21: 500 [IU]

## 2022-01-21 MED ORDER — SODIUM CHLORIDE 0.9 % IV SOLN: Freq: Once | INTRAVENOUS | Status: AC

## 2022-01-21 MED ORDER — VENETOCLAX 100 MG PO TABS
200.0000 mg | ORAL_TABLET | Freq: Every day | ORAL | 0 refills | Status: DC
  Filled 2022-01-21: qty 28, 14d supply, fill #0

## 2022-01-21 MED ORDER — SODIUM CHLORIDE 0.9 % IV SOLN
10.0000 mg | Freq: Once | INTRAVENOUS | Status: AC
  Administered 2022-01-21: 10 mg via INTRAVENOUS
  Filled 2022-01-21: qty 10

## 2022-01-21 MED ORDER — SODIUM CHLORIDE 0.9% FLUSH
10.0000 mL | Freq: Once | INTRAVENOUS | Status: AC
  Administered 2022-01-21: 10 mL via INTRAVENOUS

## 2022-01-21 MED ORDER — SODIUM CHLORIDE 0.9% FLUSH
10.0000 mL | INTRAVENOUS | Status: DC | PRN
  Administered 2022-01-21: 10 mL

## 2022-01-21 MED ORDER — SODIUM CHLORIDE 0.9 % IV SOLN
75.0000 mg/m2 | Freq: Once | INTRAVENOUS | Status: AC
  Administered 2022-01-21: 140 mg via INTRAVENOUS
  Filled 2022-01-21: qty 14

## 2022-01-21 NOTE — Progress Notes (Signed)
Chad Lamb, [place] [address]   CLINIC:  Medical Oncology/Hematology  PCP:  [name], [place] / [place] [id number] [phone]   REASON FOR VISIT:  Follow-up for MDS with EB 2  PRIOR THERAPY: none  NGS Results: ASXL 1 mutation  CURRENT THERAPY: Azacitidine IV D1-7 q28d (started 06/18/21)  BRIEF ONCOLOGIC HISTORY:  Oncology History  Myelodysplastic syndrome ([place])  06/17/2021 Initial Diagnosis   Myelodysplastic syndrome ([place] [place])   07/30/2021 - 10/30/2021 Chemotherapy   Patient is on Treatment Plan : MYELODYSPLASIA  Azacitidine IV D1-7 q28d     07/30/2021 -  Chemotherapy   Patient is on Treatment Plan : MYELODYSPLASIA  Azacitidine IV D1-7 q28d       CANCER STAGING:  Cancer Staging  No matching staging information was found for the patient.  INTERVAL HISTORY:  Chad Lamb, a 76 y.o. male, seen for follow-up of MDS.  He had bone marrow biopsy done.  Energy levels are 75%.  Reports some constipation which is stable.  REVIEW OF SYSTEMS:  Review of Systems  Constitutional:  Negative for appetite change and fatigue.  HENT:   Negative for nosebleeds and trouble swallowing.   Respiratory:  Positive for shortness of breath (Only on exertion while swimming). Negative for hemoptysis.   Gastrointestinal:  Positive for constipation. Negative for blood in stool and nausea.  Genitourinary:  Negative for hematuria.   Hematological:  Does not bruise/bleed easily.  Psychiatric/Behavioral:  Positive for sleep disturbance. The patient is not nervous/anxious.   All other systems reviewed and are negative.   PAST MEDICAL/SURGICAL HISTORY:  Past Medical History:  Diagnosis Date   Allergy    Anxiety    Arthritis    Asthma    Cataract    Clotting disorder ([place])    DVT (deep venous thrombosis) ([facility])    unknown etiology. 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[place]) 06/17/2021   Pneumonia    Port-A-Cath in place 07/30/2021   Past Surgical History:  Procedure Laterality Date   CATARACT EXTRACTION W/PHACO Right 11/24/2017   Procedure: CATARACT EXTRACTION PHACO AND INTRAOCULAR LENS PLACEMENT RIGHT EYE;  Surgeon: [name], MD;  Location: [facility];  Service: Ophthalmology;  Laterality: Right;  CDE: 8.61   CATARACT EXTRACTION W/PHACO Left 12/29/2017   Procedure: CATARACT EXTRACTION PHACO AND INTRAOCULAR LENS PLACEMENT (IOC);  Surgeon: [name], MD;  Location: [facility];  Service: Ophthalmology;  Laterality: Left;  CDE: 7.21   GANGLION CYST EXCISION     HERNIA REPAIR     bilateral   PARS PLANA VITRECTOMY Left 05/17/2021   Procedure: PARS PLANA VITRECTOMY 25 GAUGE WITH ANTIBIOTIC INJECTION  AND ENDOLASER LEFT EYE;  Surgeon: [name], MD;  Location: [facility] [facility];  Service: Ophthalmology;  Laterality: Left;   PORTACATH PLACEMENT Right 07/09/2021   Procedure: INSERTION PORT-A-CATH;  Surgeon: Aviva Signs, MD;  Location: [facility];  Service: General;  Laterality: Right;   TONSILLECTOMY     VASCULAR SURGERY      SOCIAL HISTORY:  Social History   Socioeconomic History   Marital status: Married    Spouse name: [name]   Number of children: 0   Years of education: 13   Highest education level: High school graduate  Occupational History   Not on file  Tobacco  Use   Smoking status: Former    Types: Pipe    Quit date: 03/21/1966    Years since quitting: 55.8   Smokeless tobacco: Never  Vaping Use   Vaping Use: Never used  Substance and Sexual Activity   Alcohol use: Yes    Alcohol/week: 1.0 standard drink of alcohol    Types: 1 Cans of beer per week    Comment: 1 can of beer a week   Drug use: Never   Sexual activity: Not Currently  Other Topics Concern   Not on file  Social History Narrative   ** Merged History Encounter **       Grew up in [place], [place] and [place].   Retired.  Worked in [place]. 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    Social Determinants of Health   Financial Resource Strain: Low Risk  (01/23/2017)   Overall Financial Resource Strain (CARDIA)    Difficulty of Paying Living Expenses: Not hard at all  Food Insecurity: No Food Insecurity (01/23/2017)   Hunger Vital Sign    Worried About Running Out of Food in the Last Year: Never true    Ran Out of Food in the Last Year: Never true  Transportation Needs: No Transportation Needs (01/23/2017)   PRAPARE - Hydrologist (Medical): No    Lack of Transportation (Non-Medical): No  Physical Activity: Unknown (01/23/2017)   Exercise Vital Sign    Days of Exercise per Week: 3 days    Minutes of Exercise per Session: Not on file  Stress: Stress Concern Present (01/23/2017)   [place]    Feeling of Stress : To some extent  Social Connections: Somewhat Isolated (01/23/2017)   Social Connection and Isolation Panel [NHANES]    Frequency of Communication with Friends and Family: More than three times a week    Frequency of Social Gatherings with Friends and Family: More than three times a week    Attends Religious Services: Never    Marine scientist or Organizations: No    Attends Archivist Meetings: Never    Marital Status: Married  Human resources officer Violence: Not At Risk (01/23/2017)   Humiliation, Afraid, Rape, and Kick questionnaire    Fear of Current or Ex-Partner: No    Emotionally Abused: No    Physically Abused: No    Sexually Abused: No    FAMILY HISTORY:  Family History  Problem Relation Age of Onset   Cancer Mother        pancreatic   Arthritis Father    Early death Father 60       Lung infiltrate   Colon cancer Neg Hx    Colon polyps Neg Hx     CURRENT MEDICATIONS:  Current Outpatient Medications  Medication Sig  Dispense Refill   albuterol (PROVENTIL HFA;VENTOLIN HFA) 108 (90 Base) MCG/ACT inhaler Inhale 1-2 puffs into the lungs every 6 (six) hours as needed for wheezing or shortness of breath.     ALPRAZolam (XANAX) 0.5 MG tablet Take 0.5 mg by mouth 2 (two) times daily as needed for anxiety.     amLODipine (NORVASC) 2.5 MG tablet Take 2.5 mg by mouth daily.     azaCITIDine 5 mg/2 mLs in lactated ringers infusion Inject into the vein daily. Days 1-7 every 28 days     fluticasone furoate-vilanterol (BREO ELLIPTA) 200-25 MCG/ACT AEPB Inhale 1  puff into the lungs daily.     Lidocaine-Hydrocort, Perianal, 3-0.5 % CREA Apply topically 2 (two) times daily as needed.     Lidocaine-Hydrocortisone Ace 3-0.5 % CREA Apply 1 application topically as directed. (Patient taking differently: Apply 1 application  topically 2 (two) times daily as needed (hemorrhoids).) 30 Tube 11   loratadine (CLARITIN) 10 MG tablet Take 10 mg by mouth daily.     Lutein 40 MG CAPS Take 40 mg by mouth daily.     Multiple Vitamins-Minerals (CENTRUM ADULTS PO) Take 1 tablet by mouth daily.     nystatin cream (MYCOSTATIN) Apply 1 application. topically 2 (two) times daily as needed for dry skin.     ofloxacin (OCUFLOX) 0.3 % ophthalmic solution Place 1 drop into the left eye See admin instructions. 1 drop to left eye 4 x daily for 7 days after monthly procedure.     omeprazole (PRILOSEC OTC) 20 MG tablet Take 5-10 mg by mouth daily.     sildenafil (VIAGRA) 50 MG tablet Take 50 mg by mouth daily as needed for erectile dysfunction.     traMADol (ULTRAM) 50 MG tablet Take 1 tablet (50 mg total) by mouth every 6 (six) hours as needed. 15 tablet 0   No current facility-administered medications for this visit.    ALLERGIES:  Allergies  Allergen Reactions   Cephalosporins Itching   Amoxapine And Related Itching and Rash    Has patient had a PCN reaction causing immediate rash, facial/tongue/throat swelling, SOB or lightheadedness with  hypotension: No Has patient had a PCN reaction causing severe rash involving mucus membranes or skin necrosis: No Has patient had a PCN reaction that required hospitalization: No Has patient had a PCN reaction occurring within the last 10 years: No If all of the above answers are "NO", then may proceed with Cephalosporin use.     Amoxicillin Rash   Cephalexin Rash   Clindamycin/Lincomycin Itching and Rash   Sulfamethoxazole Itching and Rash   Trimethoprim Rash    PHYSICAL EXAM:  Performance status (ECOG): 1 - Symptomatic but completely ambulatory  There were no vitals filed for this visit. Wt Readings from Last 3 Encounters:  01/04/22 156 lb 8.4 oz (71 kg)  12/25/21 157 lb 12.8 oz (71.6 kg)  12/24/21 177 lb 12.8 oz (80.6 kg)   Physical Exam Vitals reviewed.  Constitutional:      Appearance: Normal appearance.  Cardiovascular:     Heart sounds: Normal heart sounds.  Pulmonary:     Effort: Pulmonary effort is normal.     Breath sounds: Normal breath sounds.  Neurological:     Mental Status: He is alert and oriented to person, place, and time.  Psychiatric:        Mood and Affect: Mood normal.        Behavior: Behavior normal.     LABORATORY DATA:  I have reviewed the labs as listed.     Latest Ref Rng & Units 01/04/2022    7:59 AM 12/17/2021    9:12 AM 11/20/2021    9:34 AM  CBC  WBC 4.0 - 10.5 K/uL 1.8  2.0  2.2   Hemoglobin 13.0 - 17.0 g/dL 11.1  11.4  11.5   Hematocrit 39.0 - 52.0 % 33.2  34.0  34.1   Platelets 150 - 400 K/uL 96  108  155       Latest Ref Rng & Units 12/17/2021    9:12 AM 11/20/2021    9:33 AM 10/22/2021  12:27 PM  CMP  Glucose 70 - 99 mg/dL 104  107  105   BUN 8 - 23 mg/dL _0 Creatinine 0.61 - 1.24 mg/dL 0.95  0.85  0.87   Sodium 135 - 145 mmol/L 138  140  137   Potassium 3.5 - 5.1 mmol/L 3.5  3.7  3.5   Chloride 98 - 111 mmol/L 107  108  108   CO2 22 - 32 mmol/L _1 Calcium 8.9 - 10.3 mg/dL 9.2  9.3  9.1   Total  Protein 6.5 - 8.1 g/dL 6.1  6.2  6.3   Total Bilirubin 0.3 - 1.2 mg/dL 1.9  1.7  1.3   Alkaline Phos 38 - 126 U/L 62  66  62   AST 15 - 41 U/L _2 ALT 0 - 44 U/L _3 DIAGNOSTIC IMAGING:  I have independently reviewed the scans and discussed with the patient. CT BONE MARROW BIOPSY & ASPIRATION  Result Date: 01/04/2022 INDICATION: Myelodysplastic syndrome EXAM: CT GUIDED BONE MARROW ASPIRATES AND BIOPSY MEDICATIONS: None. ANESTHESIA/SEDATION: Fentanyl 100 mcg IV; Versed 2 mg IV Moderate Sedation Time:  6 minutes The patient was continuously monitored during the procedure by the interventional radiology nurse under my direct supervision. COMPLICATIONS: None immediate. PROCEDURE: The procedure was explained to the patient. The risks and benefits of the procedure were discussed and the patient's questions were addressed. Informed consent was obtained from the patient. The patient was placed prone on CT table. Images of the pelvis were obtained. The right side of back was prepped and draped in sterile fashion. The skin and right posterior ilium were anesthetized with 1% lidocaine. 11 gauge bone needle was directed into the right ilium with CT guidance. Two aspirates and 1 core biopsy were obtained. Bandage placed over the puncture site. IMPRESSION: CT guided bone marrow aspiration and core biopsy. Electronically Signed   By: Miachel Roux M.D.   On: 01/04/2022 09:35     ASSESSMENT:  MDS with EB 2: - Seen at the request of Dr. Mannie Stabile for abnormal CBC. - Labs on 12/06/2020 shows white count 3.0 with 41% neutrophils, 41% lymphocytes, 13.8% monocytes.  ANC was 1.2.  Hemoglobin 13.4 and normal.  MCV was slightly high at 96.  Platelet count was 128.  Vitamin A21 and folic acid were normal. - He took antibiotics for teeth implants few times this year, last 1 on 11/16/2020.  He does not know the antibiotic name.  He also reports taking over-the-counter pill for memory for 5 to 6 months, which was  stopped around September 2022. - Labs from 07/07/2020 with creatinine 0.92.  White count 3.3 (37% neutrophils, 45% lymphocytes, 14% monocytes, 3% eosinophils), ANC 1.2.  Platelet count 149. - His prior CBC from 12/08/2017 in our system was completely normal. - Reports slight worsening of the night sweats in the last 3 months.  At least once per week and has to change clothes.  No fevers or weight loss. - BMBX on 06/04/2021: Hypercellular marrow with dyspoietic changes involving the granulocytic cell line and megakaryocytes.  Myeloblasts 8% on aspirate smears and 10% by flow.  Cytogenetics 75, XY (20).  MDS FISH panel normal. 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 - IPSS-M score of 0.12, moderate high risk.  Leukemia free survival is 2.3 years.  Overall survival 2.8 years. - Cycle 1 of azacitidine  on 07/30/2021   Social/family history: - He lives at home with his wife.  He swims about 40 minutes daily. 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  Non-smoker. - Paternal aunt had breast cancer and mother had pancreatic cancer.  3.  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 - He underwent thrombolytic [facility] at [facility]. - He will followed up with vascular at [facility] and has been on Xarelto since then.   PLAN:  MDS with EB 2: -I have reviewed her bone marrow biopsy results from 01/04/2022.  Overall there is improvement in the blast count and cellularity.  Chromosome analysis and FISH panel were normal. - I have talked to him about adding venetoclax to the current regimen. - We will start him at a lower dose of 200 mg daily for 2 weeks on/2 weeks off. - He will start venetoclax with next cycle. - Reviewed labs today with hemoglobin 11.1, PLT 147, white count 2.1 with ANC of 0.8.  LFTs are normal. - Proceed with cycle 7 of azacitidine today.  RTC 4 weeks for follow-up.   Unprovoked left  subclavian DVT: -Xarelto discontinued due to hemorrhoidal bleeding.  3.  Constipation: -This is partly from Aloxi.  Continue Colace 3 times daily and MiraLAX at bedtime.   Orders placed this encounter:  No orders of the defined types were placed in this encounter.     [name], MD [facility] [phone]

## 2022-01-21 NOTE — Patient Instructions (Addendum)
[place] at [facility] Discharge Instructions   You were seen and examined today by Dr. Delton Coombes.  He reviewed the results of your bone marrow biopsy which shows improvement in your MDS.  He discussed starting you on a pill called Venetoclax to help even further get the blasts under control. This pill will be sent to a specialty pharmacy and be delivered to your home by UPS or FedEx.   We will proceed with your treatment today.   Return as scheduled.    Thank you for choosing [place] at [facility] to provide your oncology and hematology care.  To afford each patient quality time with our provider, please arrive at least 15 minutes before your scheduled appointment time.   If you have a lab appointment with the [facility] please come in thru the Main Entrance and check in at the main information desk.  You need to re-schedule your appointment should you arrive 10 or more minutes late.  We strive to give you quality time with our providers, and arriving late affects you and other patients whose appointments are after yours.  Also, if you no show three or more times for appointments you may be dismissed from the clinic at the providers discretion.     Again, thank you for choosing [facility].  Our hope is that these requests will decrease the amount of time that you wait before being seen by our physicians.       _____________________________________________________________  Should you have questions after your visit to [facility], please contact our office at [phone] and follow the prompts.  Our office hours are 8:00 a.m. and 4:30 p.m. Monday - Friday.  Please note that voicemails left after 4:00 p.m. may not be returned until the following business day.  We are closed weekends and major holidays.  You do have access to a nurse 24-7, just call the main number to the clinic [phone] and do not press  any options, hold on the line and a nurse will answer the phone.    For prescription refill requests, have your pharmacy contact our office and allow 72 hours.    Due to Covid, you will need to wear a mask upon entering the hospital. If you do not have a mask, a mask will be given to you at the Main Entrance upon arrival. For doctor visits, patients may have 1 support person age 80 or older with them. For treatment visits, patients can not have anyone with them due to social distancing guidelines and our immunocompromised population.

## 2022-01-21 NOTE — Progress Notes (Signed)
Patient has been examined by Dr. Delton Coombes, and vital signs and labs have been reviewed. ANC, Creatinine, LFTs, hemoglobin, and platelets are within treatment parameters per M.D. - pt may proceed with treatment.  Primary RN and pharmacy notified.

## 2022-01-21 NOTE — Patient Instructions (Signed)
[place]  Discharge Instructions: Thank you for choosing [facility] to provide your oncology and hematology care.  If you have a lab appointment with the [facility], please come in thru the Main Entrance and check in at the main information desk.  Wear comfortable clothing and clothing appropriate for easy access to any Portacath or PICC line.   We strive to give you quality time with your provider. You may need to reschedule your appointment if you arrive late (15 or more minutes).  Arriving late affects you and other patients whose appointments are after yours.  Also, if you miss three or more appointments without notifying the office, you may be dismissed from the clinic at the provider's discretion.      For prescription refill requests, have your pharmacy contact our office and allow 72 hours for refills to be completed.    Today you received the following chemotherapy and/or immunotherapy agents Vidaza.  Azacitidine Injection What is this medication? AZACITIDINE ([place]) treats blood and bone marrow cancers. It works by slowing down the growth of cancer cells. This medicine may be used for other purposes; ask your health care provider or pharmacist if you have questions. COMMON BRAND NAME(S): Vidaza What should I tell my care team before I take this medication? They need to know if you have any of these conditions: Kidney disease Liver disease Low blood cell levels, such as low white cells, platelets, or red blood cells Low levels of albumin in the blood Low levels of bicarbonate in the blood An unusual or allergic reaction to azacitidine, mannitol, other medications, foods, dyes, or preservatives If you or your partner are pregnant or trying to get pregnant Breast-feeding How should I use this medication? This medication is injected into a vein or under the skin. It is given by your care team in a hospital or clinic  setting. Talk to your care team about the use of this medication in children. While it may be prescribed for children as young as 1 month for selected conditions, precautions do apply. Overdosage: If you think you have taken too much of this medicine contact a poison control center or emergency room at once. NOTE: This medicine is only for you. Do not share this medicine with others. What if I miss a dose? Keep appointments for follow-up doses. It is important not to miss your dose. Call your care team if you are unable to keep an appointment. What may interact with this medication? Interactions are not expected. This list may not describe all possible interactions. Give your health care provider a list of all the medicines, herbs, non-prescription drugs, or dietary supplements you use. Also tell them if you smoke, drink alcohol, or use illegal drugs. Some items may interact with your medicine. What should I watch for while using this medication? Your condition will be monitored carefully while you are receiving this medication. You may need blood work while taking this medication. This medication may make you feel generally unwell. This is not uncommon as chemotherapy can affect healthy cells as well as cancer cells. Report any side effects. Continue your course of treatment even though you feel ill unless your care team tells you to stop. Other product types may be available that contain the medication azacitidine. The injection and oral products should not be used in place of one another. Talk to your care team if you have questions. This medication can cause serious side  effects. To reduce the risk, your care team may give you other medications to take before receiving this one. Be sure to follow the directions from your care team. This medication may increase your risk of getting an infection. Call your care team for advice if you get a fever, chills, sore throat, or other symptoms of a cold or  flu. Do not treat yourself. Try to avoid being around people who are sick. Avoid taking medications that contain aspirin, acetaminophen, ibuprofen, naproxen, or ketoprofen unless instructed by your care team. These medications may hide a fever. This medication may increase your risk to bruise or bleed. Call your care team if you notice any unusual bleeding. Be careful brushing or flossing your teeth or using a toothpick because you may get an infection or bleed more easily. If you have any dental work done, tell your dentist you are receiving this medication. Talk to your care team if you or your partner may be pregnant. Serious birth defects can occur if you take this medication during pregnancy and for 6 months after the last dose. You will need a negative pregnancy test before starting this medication. Contraception is recommended while taking his medication and for 6 months after the last dose. Your care team can help you find the option that works for you. If your partner can get pregnant, use a condom during sex while taking this medication and for 3 months after the last dose. Do not breastfeed while taking this medication and for 1 week after the last dose. This medication may cause infertility. Talk to your care team if you are concerned about your fertility. What side effects may I notice from receiving this medication? Side effects that you should report to your care team as soon as possible: Allergic reactions--skin rash, itching, hives, swelling of the face, lips, tongue, or throat Infection--fever, chills, cough, sore throat, wounds that don't heal, pain or trouble when passing urine, general feeling of discomfort or being unwell Kidney injury--decrease in the amount of urine, swelling of the ankles, hands, or feet Liver injury--right upper belly pain, loss of appetite, nausea, light-colored stool, dark yellow or Virgie Kunda urine, yellowing skin or eyes, unusual weakness or fatigue Low red  blood cell level--unusual weakness or fatigue, dizziness, headache, trouble breathing Tumor lysis syndrome (TLS)--nausea, vomiting, diarrhea, decrease in the amount of urine, dark urine, unusual weakness or fatigue, confusion, muscle pain or cramps, fast or irregular heartbeat, joint pain Unusual bruising or bleeding Side effects that usually do not require medical attention (report to your care team if they continue or are bothersome): Constipation Diarrhea Nausea Pain, redness, or irritation at injection site Vomiting This list may not describe all possible side effects. Call your doctor for medical advice about side effects. You may report side effects to FDA at 1-800-FDA-1088. Where should I keep my medication? This medication is given in a hospital or clinic. It will not be stored at home. NOTE: This sheet is a summary. It may not cover all possible information. If you have questions about this medicine, talk to your doctor, pharmacist, or health care provider.  2023 Elsevier/Gold Standard (2021-07-19 00:00:00)        To help prevent nausea and vomiting after your treatment, we encourage you to take your nausea medication as directed.  BELOW ARE SYMPTOMS THAT SHOULD BE REPORTED IMMEDIATELY: *FEVER GREATER THAN 100.4 F (38 C) OR HIGHER *CHILLS OR SWEATING *NAUSEA AND VOMITING THAT IS NOT CONTROLLED WITH YOUR NAUSEA MEDICATION *UNUSUAL SHORTNESS OF  BREATH *UNUSUAL BRUISING OR BLEEDING *URINARY PROBLEMS (pain or burning when urinating, or frequent urination) *BOWEL PROBLEMS (unusual diarrhea, constipation, pain near the anus) TENDERNESS IN MOUTH AND THROAT WITH OR WITHOUT PRESENCE OF ULCERS (sore throat, sores in mouth, or a toothache) UNUSUAL RASH, SWELLING OR PAIN  UNUSUAL VAGINAL DISCHARGE OR ITCHING   Items with * indicate a potential emergency and should be followed up as soon as possible or go to the Emergency Department if any problems should occur.  Please show the  CHEMOTHERAPY ALERT CARD or IMMUNOTHERAPY ALERT CARD at check-in to the Emergency Department and triage nurse.  Should you have questions after your visit or need to cancel or reschedule your appointment, please contact [facility] [phone]  and follow the prompts.  Office hours are 8:00 a.m. to 4:30 p.m. Monday - Friday. Please note that voicemails left after 4:00 p.m. may not be returned until the following business day.  We are closed weekends and major holidays. You have access to a nurse at all times for urgent questions. Please call the main number to the clinic [phone] and follow the prompts.  For any non-urgent questions, you may also contact your provider using MyChart. We now offer e-Visits for anyone 36 and older to request care online for non-urgent symptoms. For details visit [email or web address].   Also download the MyChart app! Go to the app store, search "MyChart", open the app, select [facility], and log in with your MyChart username and password.  Masks are optional in the cancer centers. If you would like for your care team to wear a mask while they are taking care of you, please let them know. You may have one support person who is at least 77 years old accompany you for your appointments.

## 2022-01-21 NOTE — Progress Notes (Signed)
Patient presents today for chemotherapy infusion.  Patient is in satisfactory condition with no new complaints voiced.  Vital signs are stable. Labs reviewed by Dr. Delton Coombes during his office visit.  ANC today is 0.8.  All other labs are within treatment parameters.  We will proceed with treatment per MD orders.   Patient tolerated treatment well with no complaints voiced.  Patient left ambulatory in stable condition.  Vital signs stable at discharge.  Follow up as scheduled.

## 2022-01-22 ENCOUNTER — Inpatient Hospital Stay: Payer: Medicare HMO

## 2022-01-22 ENCOUNTER — Other Ambulatory Visit (HOSPITAL_COMMUNITY): Payer: Self-pay

## 2022-01-22 ENCOUNTER — Telehealth: Payer: Self-pay

## 2022-01-22 ENCOUNTER — Encounter: Payer: Self-pay | Admitting: Hematology

## 2022-01-22 ENCOUNTER — Telehealth: Payer: Self-pay | Admitting: Pharmacist

## 2022-01-22 ENCOUNTER — Other Ambulatory Visit: Payer: Self-pay

## 2022-01-22 VITALS — BP 133/76 | HR 60 | Temp 98.0°F | Resp 18

## 2022-01-22 DIAGNOSIS — D469 Myelodysplastic syndrome, unspecified: Secondary | ICD-10-CM

## 2022-01-22 DIAGNOSIS — Z95828 Presence of other vascular implants and grafts: Secondary | ICD-10-CM

## 2022-01-22 DIAGNOSIS — Z5111 Encounter for antineoplastic chemotherapy: Secondary | ICD-10-CM | POA: Diagnosis not present

## 2022-01-22 MED ORDER — SODIUM CHLORIDE 0.9 % IV SOLN
10.0000 mg | Freq: Once | INTRAVENOUS | Status: AC
  Administered 2022-01-22: 10 mg via INTRAVENOUS
  Filled 2022-01-22: qty 10

## 2022-01-22 MED ORDER — SODIUM CHLORIDE 0.9 % IV SOLN
75.0000 mg/m2 | Freq: Once | INTRAVENOUS | Status: AC
  Administered 2022-01-22: 140 mg via INTRAVENOUS
  Filled 2022-01-22: qty 14

## 2022-01-22 MED ORDER — HEPARIN SOD (PORK) LOCK FLUSH 100 UNIT/ML IV SOLN
500.0000 [IU] | Freq: Once | INTRAVENOUS | Status: AC | PRN
  Administered 2022-01-22: 500 [IU]

## 2022-01-22 MED ORDER — SODIUM CHLORIDE 0.9% FLUSH
10.0000 mL | INTRAVENOUS | Status: DC | PRN
  Administered 2022-01-22: 10 mL

## 2022-01-22 MED ORDER — VENETOCLAX 100 MG PO TABS
200.0000 mg | ORAL_TABLET | Freq: Every day | ORAL | 0 refills | Status: DC
  Filled 2022-01-22: qty 28, 14d supply, fill #0
  Filled 2022-01-30: qty 28, 28d supply, fill #0

## 2022-01-22 MED ORDER — SODIUM CHLORIDE 0.9 % IV SOLN: Freq: Once | INTRAVENOUS | Status: AC

## 2022-01-22 NOTE — Progress Notes (Signed)
Patient presents today for Vidaza infusion per providers order.  Vital signs within parameters for treatment.  Patient has no new complaints at this time.  Treatment given today per MD orders.  Stable during infusion without adverse affects.  Vital signs stable.  No complaints at this time.  Discharge from clinic ambulatory in stable condition.  Alert and oriented X 3.  Follow up with [facility] as scheduled.

## 2022-01-22 NOTE — Telephone Encounter (Signed)
Oral Oncology Patient Advocate Encounter  Received notification that the request for prior authorization for Venclexta has been denied due to:  Your plan does not allow coverage of this medication based on your prescriber answering No to the following  question(s):  Does the patient have a diagnosis of chronic lymphocytic leukemia (CLL) or small lymphocytic lymphoma  (SLL)?  Does the patient have a diagnosis of mantle cell lymphoma? Does the patient have a diagnosis of acute myeloid leukemia (AML)?  Does the patient have a diagnosis of blastic plasmacytoid dendritic cell neoplasm (BPDCN)?  Does the patient have a diagnosis of multiple myeloma?  Does the patient have a diagnosis of Waldenstrom macroglobulinemia/lymphoplasmacytic lymphoma?  Does the patient have a diagnosis of systemic light chain amyloidosis?    Berdine Addison, [facility] [place]  [phone] (phone) [phone] (fax) 01/22/2022 12:27 PM

## 2022-01-22 NOTE — Telephone Encounter (Signed)
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 Planned start with next cycle on 02/18/22.   CBC/CMP from 01/21/22 assessed, patient neutropenic, continue to monitor. Prescription dose and frequency assessed.   Current medication list in Epic reviewed, no DDIs with venetoclax identified.  Evaluated chart and no patient barriers to medication adherence identified.   Prescription has been e-scribed to the [facility] for benefits analysis and approval.  Oral Oncology Clinic will continue to follow for insurance authorization, copayment issues, initial counseling and start date.   Darl Pikes, PharmD, BCPS, BCOP, CPP Hematology/Oncology Clinical Pharmacist Practitioner [place]/DB/AP [facility] [phone]  01/22/2022 10:06 AM

## 2022-01-22 NOTE — Telephone Encounter (Signed)
Oral Oncology Patient Advocate Encounter   Received notification that prior authorization for Venclexta is required.   PA submitted on 11.07.23  Key [id number]  Status is pending     Berdine Addison, [place] [facility] Patient [place]  [phone] (phone) [phone] (fax) 01/22/2022 10:17 AM

## 2022-01-22 NOTE — Patient Instructions (Signed)
[facility]  Discharge Instructions: Thank you for choosing [facility] to provide your oncology and hematology care.  If you have a lab appointment with the [facility], please come in thru the Main Entrance and check in at the main information desk.  Wear comfortable clothing and clothing appropriate for easy access to any Portacath or PICC line.   We strive to give you quality time with your provider. You may need to reschedule your appointment if you arrive late (15 or more minutes).  Arriving late affects you and other patients whose appointments are after yours.  Also, if you miss three or more appointments without notifying the office, you may be dismissed from the clinic at the provider's discretion.      For prescription refill requests, have your pharmacy contact our office and allow 72 hours for refills to be completed.    Today you received the following chemotherapy and/or immunotherapy agents Vidaza      To help prevent nausea and vomiting after your treatment, we encourage you to take your nausea medication as directed.  BELOW ARE SYMPTOMS THAT SHOULD BE REPORTED IMMEDIATELY: *FEVER GREATER THAN 100.4 F (38 C) OR HIGHER *CHILLS OR SWEATING *NAUSEA AND VOMITING THAT IS NOT CONTROLLED WITH YOUR NAUSEA MEDICATION *UNUSUAL SHORTNESS OF BREATH *UNUSUAL BRUISING OR BLEEDING *URINARY PROBLEMS (pain or burning when urinating, or frequent urination) *BOWEL PROBLEMS (unusual diarrhea, constipation, pain near the anus) TENDERNESS IN MOUTH AND THROAT WITH OR WITHOUT PRESENCE OF ULCERS (sore throat, sores in mouth, or a toothache) UNUSUAL RASH, SWELLING OR PAIN  UNUSUAL VAGINAL DISCHARGE OR ITCHING   Items with * indicate a potential emergency and should be followed up as soon as possible or go to the Emergency Department if any problems should occur.  Please show the CHEMOTHERAPY ALERT CARD or IMMUNOTHERAPY ALERT CARD at check-in to the Emergency  Department and triage nurse.  Should you have questions after your visit or need to cancel or reschedule your appointment, please contact [facility] [phone]  and follow the prompts.  Office hours are 8:00 a.m. to 4:30 p.m. Monday - Friday. Please note that voicemails left after 4:00 p.m. may not be returned until the following business day.  We are closed weekends and major holidays. You have access to a nurse at all times for urgent questions. Please call the main number to the clinic [phone] and follow the prompts.  For any non-urgent questions, you may also contact your provider using MyChart. We now offer e-Visits for anyone 84 and older to request care online for non-urgent symptoms. For details visit [email or web address].   Also download the MyChart app! Go to the app store, search "MyChart", open the app, select [facility], and log in with your MyChart username and password.  Masks are optional in the cancer centers. If you would like for your care team to wear a mask while they are taking care of you, please let them know. You may have one support person who is at least 77 years old accompany you for your appointments.

## 2022-01-23 ENCOUNTER — Inpatient Hospital Stay: Payer: Medicare HMO

## 2022-01-23 VITALS — BP 134/87 | HR 67 | Temp 97.7°F | Resp 18

## 2022-01-23 DIAGNOSIS — Z5111 Encounter for antineoplastic chemotherapy: Secondary | ICD-10-CM | POA: Diagnosis not present

## 2022-01-23 DIAGNOSIS — Z95828 Presence of other vascular implants and grafts: Secondary | ICD-10-CM

## 2022-01-23 DIAGNOSIS — D469 Myelodysplastic syndrome, unspecified: Secondary | ICD-10-CM

## 2022-01-23 MED ORDER — SODIUM CHLORIDE 0.9 % IV SOLN
75.0000 mg/m2 | Freq: Once | INTRAVENOUS | Status: AC
  Administered 2022-01-23: 140 mg via INTRAVENOUS
  Filled 2022-01-23: qty 14

## 2022-01-23 MED ORDER — PALONOSETRON HCL INJECTION 0.25 MG/5ML
0.2500 mg | Freq: Once | INTRAVENOUS | Status: AC
  Administered 2022-01-23: 0.25 mg via INTRAVENOUS
  Filled 2022-01-23: qty 5

## 2022-01-23 MED ORDER — SODIUM CHLORIDE 0.9 % IV SOLN: Freq: Once | INTRAVENOUS | Status: AC

## 2022-01-23 MED ORDER — SODIUM CHLORIDE 0.9 % IV SOLN
10.0000 mg | Freq: Once | INTRAVENOUS | Status: AC
  Administered 2022-01-23: 10 mg via INTRAVENOUS
  Filled 2022-01-23: qty 10

## 2022-01-23 MED ORDER — HEPARIN SOD (PORK) LOCK FLUSH 100 UNIT/ML IV SOLN
500.0000 [IU] | Freq: Once | INTRAVENOUS | Status: AC | PRN
  Administered 2022-01-23: 500 [IU]

## 2022-01-23 MED ORDER — SODIUM CHLORIDE 0.9% FLUSH
10.0000 mL | INTRAVENOUS | Status: DC | PRN
  Administered 2022-01-23: 10 mL

## 2022-01-23 NOTE — Patient Instructions (Signed)
[facility]  Discharge Instructions: Thank you for choosing [facility] to provide your oncology and hematology care.  If you have a lab appointment with the [facility], please come in thru the Main Entrance and check in at the main information desk.  Wear comfortable clothing and clothing appropriate for easy access to any Portacath or PICC line.   We strive to give you quality time with your provider. You may need to reschedule your appointment if you arrive late (15 or more minutes).  Arriving late affects you and other patients whose appointments are after yours.  Also, if you miss three or more appointments without notifying the office, you may be dismissed from the clinic at the provider's discretion.      For prescription refill requests, have your pharmacy contact our office and allow 72 hours for refills to be completed.    Today you received the following chemotherapy and/or immunotherapy agents D2 Vidaza   To help prevent nausea and vomiting after your treatment, we encourage you to take your nausea medication as directed.  Azacitidine Injection What is this medication? AZACITIDINE (ay [place]) treats blood and bone marrow cancers. It works by slowing down the growth of cancer cells. This medicine may be used for other purposes; ask your health care provider or pharmacist if you have questions. COMMON BRAND NAME(S): Vidaza What should I tell my care team before I take this medication? They need to know if you have any of these conditions: Kidney disease Liver disease Low blood cell levels, such as low white cells, platelets, or red blood cells Low levels of albumin in the blood Low levels of bicarbonate in the blood An unusual or allergic reaction to azacitidine, mannitol, other medications, foods, dyes, or preservatives If you or your partner are pregnant or trying to get pregnant Breast-feeding How should I use this  medication? This medication is injected into a vein or under the skin. It is given by your care team in a hospital or clinic setting. Talk to your care team about the use of this medication in children. While it may be prescribed for children as young as 1 month for selected conditions, precautions do apply. Overdosage: If you think you have taken too much of this medicine contact a poison control center or emergency room at once. NOTE: This medicine is only for you. Do not share this medicine with others. What if I miss a dose? Keep appointments for follow-up doses. It is important not to miss your dose. Call your care team if you are unable to keep an appointment. What may interact with this medication? Interactions are not expected. This list may not describe all possible interactions. Give your health care provider a list of all the medicines, herbs, non-prescription drugs, or dietary supplements you use. Also tell them if you smoke, drink alcohol, or use illegal drugs. Some items may interact with your medicine. What should I watch for while using this medication? Your condition will be monitored carefully while you are receiving this medication. You may need blood work while taking this medication. This medication may make you feel generally unwell. This is not uncommon as chemotherapy can affect healthy cells as well as cancer cells. Report any side effects. Continue your course of treatment even though you feel ill unless your care team tells you to stop. Other product types may be available that contain the medication azacitidine. The injection and oral products should not  be used in place of one another. Talk to your care team if you have questions. This medication can cause serious side effects. To reduce the risk, your care team may give you other medications to take before receiving this one. Be sure to follow the directions from your care team. This medication may increase your risk of  getting an infection. Call your care team for advice if you get a fever, chills, sore throat, or other symptoms of a cold or flu. Do not treat yourself. Try to avoid being around people who are sick. Avoid taking medications that contain aspirin, acetaminophen, ibuprofen, naproxen, or ketoprofen unless instructed by your care team. These medications may hide a fever. This medication may increase your risk to bruise or bleed. Call your care team if you notice any unusual bleeding. Be careful brushing or flossing your teeth or using a toothpick because you may get an infection or bleed more easily. If you have any dental work done, tell your dentist you are receiving this medication. Talk to your care team if you or your partner may be pregnant. Serious birth defects can occur if you take this medication during pregnancy and for 6 months after the last dose. You will need a negative pregnancy test before starting this medication. Contraception is recommended while taking his medication and for 6 months after the last dose. Your care team can help you find the option that works for you. If your partner can get pregnant, use a condom during sex while taking this medication and for 3 months after the last dose. Do not breastfeed while taking this medication and for 1 week after the last dose. This medication may cause infertility. Talk to your care team if you are concerned about your fertility. What side effects may I notice from receiving this medication? Side effects that you should report to your care team as soon as possible: Allergic reactions--skin rash, itching, hives, swelling of the face, lips, tongue, or throat Infection--fever, chills, cough, sore throat, wounds that don't heal, pain or trouble when passing urine, general feeling of discomfort or being unwell Kidney injury--decrease in the amount of urine, swelling of the ankles, hands, or feet Liver injury--right upper belly pain, loss of  appetite, nausea, light-colored stool, dark yellow or brown urine, yellowing skin or eyes, unusual weakness or fatigue Low red blood cell level--unusual weakness or fatigue, dizziness, headache, trouble breathing Tumor lysis syndrome (TLS)--nausea, vomiting, diarrhea, decrease in the amount of urine, dark urine, unusual weakness or fatigue, confusion, muscle pain or cramps, fast or irregular heartbeat, joint pain Unusual bruising or bleeding Side effects that usually do not require medical attention (report to your care team if they continue or are bothersome): Constipation Diarrhea Nausea Pain, redness, or irritation at injection site Vomiting This list may not describe all possible side effects. Call your doctor for medical advice about side effects. You may report side effects to FDA at 1-800-FDA-1088. Where should I keep my medication? This medication is given in a hospital or clinic. It will not be stored at home. NOTE: This sheet is a summary. It may not cover all possible information. If you have questions about this medicine, talk to your doctor, pharmacist, or health care provider.  2023 Elsevier/Gold Standard (2021-07-19 00:00:00)   BELOW ARE SYMPTOMS THAT SHOULD BE REPORTED IMMEDIATELY: *FEVER GREATER THAN 100.4 F (38 C) OR HIGHER *CHILLS OR SWEATING *NAUSEA AND VOMITING THAT IS NOT CONTROLLED WITH YOUR NAUSEA MEDICATION *UNUSUAL SHORTNESS OF BREATH *UNUSUAL BRUISING  OR BLEEDING *URINARY PROBLEMS (pain or burning when urinating, or frequent urination) *BOWEL PROBLEMS (unusual diarrhea, constipation, pain near the anus) TENDERNESS IN MOUTH AND THROAT WITH OR WITHOUT PRESENCE OF ULCERS (sore throat, sores in mouth, or a toothache) UNUSUAL RASH, SWELLING OR PAIN  UNUSUAL VAGINAL DISCHARGE OR ITCHING   Items with * indicate a potential emergency and should be followed up as soon as possible or go to the Emergency Department if any problems should occur.  Please show the  CHEMOTHERAPY ALERT CARD or IMMUNOTHERAPY ALERT CARD at check-in to the Emergency Department and triage nurse.  Should you have questions after your visit or need to cancel or reschedule your appointment, please contact [facility] [phone]  and follow the prompts.  Office hours are 8:00 a.m. to 4:30 p.m. Monday - Friday. Please note that voicemails left after 4:00 p.m. may not be returned until the following business day.  We are closed weekends and major holidays. You have access to a nurse at all times for urgent questions. Please call the main number to the clinic [phone] and follow the prompts.  For any non-urgent questions, you may also contact your provider using MyChart. We now offer e-Visits for anyone 4 and older to request care online for non-urgent symptoms. For details visit [email or web address].   Also download the MyChart app! Go to the app store, search "MyChart", open the app, select [facility], and log in with your MyChart username and password.  Masks are optional in the cancer centers. If you would like for your care team to wear a mask while they are taking care of you, please let them know. You may have one support person who is at least 76 years old accompany you for your appointments.

## 2022-01-23 NOTE — Progress Notes (Signed)
Pt presents today for D3 Vidaza per provider's order. Vital signs stable and pt voiced no new complaints at this time.  D3 Vidaza given today per MD orders. Tolerated infusion without adverse affects. Vital signs stable. No complaints at this time. Discharged from clinic ambulatory in stable condition. Alert and oriented x 3. F/U with [facility] as scheduled.

## 2022-01-23 NOTE — Telephone Encounter (Signed)
Expedited appeal for Venclexta submitted through [facility]. Awaiting determination.   Called patient to provide medication update, LVM for him to call me back.

## 2022-01-24 ENCOUNTER — Inpatient Hospital Stay: Payer: Medicare HMO

## 2022-01-24 ENCOUNTER — Encounter: Payer: Self-pay | Admitting: Hematology

## 2022-01-24 ENCOUNTER — Other Ambulatory Visit (HOSPITAL_COMMUNITY): Payer: Self-pay

## 2022-01-24 VITALS — BP 147/80 | HR 67 | Temp 97.9°F | Resp 16

## 2022-01-24 DIAGNOSIS — Z95828 Presence of other vascular implants and grafts: Secondary | ICD-10-CM

## 2022-01-24 DIAGNOSIS — D469 Myelodysplastic syndrome, unspecified: Secondary | ICD-10-CM

## 2022-01-24 DIAGNOSIS — Z5111 Encounter for antineoplastic chemotherapy: Secondary | ICD-10-CM | POA: Diagnosis not present

## 2022-01-24 MED ORDER — SODIUM CHLORIDE 0.9 % IV SOLN
75.0000 mg/m2 | Freq: Once | INTRAVENOUS | Status: AC
  Administered 2022-01-24: 140 mg via INTRAVENOUS
  Filled 2022-01-24: qty 14

## 2022-01-24 MED ORDER — HEPARIN SOD (PORK) LOCK FLUSH 100 UNIT/ML IV SOLN
500.0000 [IU] | Freq: Once | INTRAVENOUS | Status: AC | PRN
  Administered 2022-01-24: 500 [IU]

## 2022-01-24 MED ORDER — SODIUM CHLORIDE 0.9 % IV SOLN: Freq: Once | INTRAVENOUS | Status: AC

## 2022-01-24 MED ORDER — SODIUM CHLORIDE 0.9 % IV SOLN
10.0000 mg | Freq: Once | INTRAVENOUS | Status: AC
  Administered 2022-01-24: 10 mg via INTRAVENOUS
  Filled 2022-01-24: qty 10

## 2022-01-24 MED ORDER — SODIUM CHLORIDE 0.9% FLUSH
10.0000 mL | INTRAVENOUS | Status: DC | PRN
  Administered 2022-01-24: 10 mL

## 2022-01-24 NOTE — Patient Instructions (Signed)
[place]  Discharge Instructions: Thank you for choosing [facility] to provide your oncology and hematology care.  If you have a lab appointment with the [facility], please come in thru the Main Entrance and check in at the main information desk.  Wear comfortable clothing and clothing appropriate for easy access to any Portacath or PICC line.   We strive to give you quality time with your provider. You may need to reschedule your appointment if you arrive late (15 or more minutes).  Arriving late affects you and other patients whose appointments are after yours.  Also, if you miss three or more appointments without notifying the office, you may be dismissed from the clinic at the provider's discretion.      For prescription refill requests, have your pharmacy contact our office and allow 72 hours for refills to be completed.    Today you received the following chemotherapy and/or immunotherapy agents Vidaza, return as scheduled.   To help prevent nausea and vomiting after your treatment, we encourage you to take your nausea medication as directed.  BELOW ARE SYMPTOMS THAT SHOULD BE REPORTED IMMEDIATELY: *FEVER GREATER THAN 100.4 F (38 C) OR HIGHER *CHILLS OR SWEATING *NAUSEA AND VOMITING THAT IS NOT CONTROLLED WITH YOUR NAUSEA MEDICATION *UNUSUAL SHORTNESS OF BREATH *UNUSUAL BRUISING OR BLEEDING *URINARY PROBLEMS (pain or burning when urinating, or frequent urination) *BOWEL PROBLEMS (unusual diarrhea, constipation, pain near the anus) TENDERNESS IN MOUTH AND THROAT WITH OR WITHOUT PRESENCE OF ULCERS (sore throat, sores in mouth, or a toothache) UNUSUAL RASH, SWELLING OR PAIN  UNUSUAL VAGINAL DISCHARGE OR ITCHING   Items with * indicate a potential emergency and should be followed up as soon as possible or go to the Emergency Department if any problems should occur.  Please show the CHEMOTHERAPY ALERT CARD or IMMUNOTHERAPY ALERT CARD at  check-in to the Emergency Department and triage nurse.  Should you have questions after your visit or need to cancel or reschedule your appointment, please contact [facility] [phone]  and follow the prompts.  Office hours are 8:00 a.m. to 4:30 p.m. Monday - Friday. Please note that voicemails left after 4:00 p.m. may not be returned until the following business day.  We are closed weekends and major holidays. You have access to a nurse at all times for urgent questions. Please call the main number to the clinic [phone] and follow the prompts.  For any non-urgent questions, you may also contact your provider using MyChart. We now offer e-Visits for anyone 24 and older to request care online for non-urgent symptoms. For details visit [email or web address].   Also download the MyChart app! Go to the app store, search "MyChart", open the app, select [facility], and log in with your MyChart username and password.  Masks are optional in the cancer centers. If you would like for your care team to wear a mask while they are taking care of you, please let them know. You may have one support person who is at least 77 years old accompany you for your appointments.

## 2022-01-24 NOTE — Progress Notes (Signed)
Patient tolerated chemotherapy with no complaints voiced. Side effects with management reviewed understanding verbalized. Port site clean and dry with no bruising or swelling noted at site. Good blood return noted before and after administration of chemotherapy. Band aid applied. Patient left in satisfactory condition with VSS and no s/s of distress noted.

## 2022-01-24 NOTE — Telephone Encounter (Signed)
Oral Oncology Patient Advocate Encounter  Prior Authorization for Chad Lamb has been approved.    PA# [id number]  Effective dates: 03/18/21 through 03/17/22  Patients co-pay is $50.00.    Chad Lamb, [name] Patient [place]  [phone] (phone) [phone] (fax) 01/24/2022 8:27 AM

## 2022-01-25 ENCOUNTER — Inpatient Hospital Stay: Payer: Medicare HMO

## 2022-01-25 VITALS — BP 139/88 | HR 62 | Temp 98.2°F | Resp 18

## 2022-01-25 DIAGNOSIS — Z95828 Presence of other vascular implants and grafts: Secondary | ICD-10-CM

## 2022-01-25 DIAGNOSIS — D469 Myelodysplastic syndrome, unspecified: Secondary | ICD-10-CM

## 2022-01-25 DIAGNOSIS — Z5111 Encounter for antineoplastic chemotherapy: Secondary | ICD-10-CM | POA: Diagnosis not present

## 2022-01-25 MED ORDER — SODIUM CHLORIDE 0.9 % IV SOLN
75.0000 mg/m2 | Freq: Once | INTRAVENOUS | Status: AC
  Administered 2022-01-25: 140 mg via INTRAVENOUS
  Filled 2022-01-25: qty 14

## 2022-01-25 MED ORDER — SODIUM CHLORIDE 0.9 % IV SOLN
10.0000 mg | Freq: Once | INTRAVENOUS | Status: AC
  Administered 2022-01-25: 10 mg via INTRAVENOUS
  Filled 2022-01-25: qty 10

## 2022-01-25 MED ORDER — SODIUM CHLORIDE 0.9 % IV SOLN: Freq: Once | INTRAVENOUS | Status: AC

## 2022-01-25 MED ORDER — SODIUM CHLORIDE 0.9% FLUSH
10.0000 mL | INTRAVENOUS | Status: DC | PRN
  Administered 2022-01-25: 10 mL

## 2022-01-25 MED ORDER — HEPARIN SOD (PORK) LOCK FLUSH 100 UNIT/ML IV SOLN
500.0000 [IU] | Freq: Once | INTRAVENOUS | Status: AC | PRN
  Administered 2022-01-25: 500 [IU]

## 2022-01-25 MED ORDER — PALONOSETRON HCL INJECTION 0.25 MG/5ML
0.2500 mg | Freq: Once | INTRAVENOUS | Status: AC
  Administered 2022-01-25: 0.25 mg via INTRAVENOUS
  Filled 2022-01-25: qty 5

## 2022-01-25 NOTE — Patient Instructions (Signed)
[facility]  Discharge Instructions: Thank you for choosing [facility] to provide your oncology and hematology care.  If you have a lab appointment with the [facility], please come in thru the Main Entrance and check in at the main information desk.  Wear comfortable clothing and clothing appropriate for easy access to any Portacath or PICC line.   We strive to give you quality time with your provider. You may need to reschedule your appointment if you arrive late (15 or more minutes).  Arriving late affects you and other patients whose appointments are after yours.  Also, if you miss three or more appointments without notifying the office, you may be dismissed from the clinic at the provider's discretion.      For prescription refill requests, have your pharmacy contact our office and allow 72 hours for refills to be completed.    Today you received the following chemotherapy and/or immunotherapy agents Vidaza. Azacitidine Injection What is this medication? AZACITIDINE (ay [place]) treats blood and bone marrow cancers. It works by slowing down the growth of cancer cells. This medicine may be used for other purposes; ask your health care provider or pharmacist if you have questions. COMMON BRAND NAME(S): Vidaza What should I tell my care team before I take this medication? They need to know if you have any of these conditions: Kidney disease Liver disease Low blood cell levels, such as low white cells, platelets, or red blood cells Low levels of albumin in the blood Low levels of bicarbonate in the blood An unusual or allergic reaction to azacitidine, mannitol, other medications, foods, dyes, or preservatives If you or your partner are pregnant or trying to get pregnant Breast-feeding How should I use this medication? This medication is injected into a vein or under the skin. It is given by your care team in a hospital or clinic setting. Talk  to your care team about the use of this medication in children. While it may be prescribed for children as young as 1 month for selected conditions, precautions do apply. Overdosage: If you think you have taken too much of this medicine contact a poison control center or emergency room at once. NOTE: This medicine is only for you. Do not share this medicine with others. What if I miss a dose? Keep appointments for follow-up doses. It is important not to miss your dose. Call your care team if you are unable to keep an appointment. What may interact with this medication? Interactions are not expected. This list may not describe all possible interactions. Give your health care provider a list of all the medicines, herbs, non-prescription drugs, or dietary supplements you use. Also tell them if you smoke, drink alcohol, or use illegal drugs. Some items may interact with your medicine. What should I watch for while using this medication? Your condition will be monitored carefully while you are receiving this medication. You may need blood work while taking this medication. This medication may make you feel generally unwell. This is not uncommon as chemotherapy can affect healthy cells as well as cancer cells. Report any side effects. Continue your course of treatment even though you feel ill unless your care team tells you to stop. Other product types may be available that contain the medication azacitidine. The injection and oral products should not be used in place of one another. Talk to your care team if you have questions. This medication can cause serious side effects.  To reduce the risk, your care team may give you other medications to take before receiving this one. Be sure to follow the directions from your care team. This medication may increase your risk of getting an infection. Call your care team for advice if you get a fever, chills, sore throat, or other symptoms of a cold or flu. Do not treat  yourself. Try to avoid being around people who are sick. Avoid taking medications that contain aspirin, acetaminophen, ibuprofen, naproxen, or ketoprofen unless instructed by your care team. These medications may hide a fever. This medication may increase your risk to bruise or bleed. Call your care team if you notice any unusual bleeding. Be careful brushing or flossing your teeth or using a toothpick because you may get an infection or bleed more easily. If you have any dental work done, tell your dentist you are receiving this medication. Talk to your care team if you or your partner may be pregnant. Serious birth defects can occur if you take this medication during pregnancy and for 6 months after the last dose. You will need a negative pregnancy test before starting this medication. Contraception is recommended while taking his medication and for 6 months after the last dose. Your care team can help you find the option that works for you. If your partner can get pregnant, use a condom during sex while taking this medication and for 3 months after the last dose. Do not breastfeed while taking this medication and for 1 week after the last dose. This medication may cause infertility. Talk to your care team if you are concerned about your fertility. What side effects may I notice from receiving this medication? Side effects that you should report to your care team as soon as possible: Allergic reactions--skin rash, itching, hives, swelling of the face, lips, tongue, or throat Infection--fever, chills, cough, sore throat, wounds that don't heal, pain or trouble when passing urine, general feeling of discomfort or being unwell Kidney injury--decrease in the amount of urine, swelling of the ankles, hands, or feet Liver injury--right upper belly pain, loss of appetite, nausea, light-colored stool, dark yellow or brown urine, yellowing skin or eyes, unusual weakness or fatigue Low red blood cell  level--unusual weakness or fatigue, dizziness, headache, trouble breathing Tumor lysis syndrome (TLS)--nausea, vomiting, diarrhea, decrease in the amount of urine, dark urine, unusual weakness or fatigue, confusion, muscle pain or cramps, fast or irregular heartbeat, joint pain Unusual bruising or bleeding Side effects that usually do not require medical attention (report to your care team if they continue or are bothersome): Constipation Diarrhea Nausea Pain, redness, or irritation at injection site Vomiting This list may not describe all possible side effects. Call your doctor for medical advice about side effects. You may report side effects to FDA at 1-800-FDA-1088. Where should I keep my medication? This medication is given in a hospital or clinic. It will not be stored at home. NOTE: This sheet is a summary. It may not cover all possible information. If you have questions about this medicine, talk to your doctor, pharmacist, or health care provider.  2023 Elsevier/Gold Standard (2021-07-19 00:00:00)       To help prevent nausea and vomiting after your treatment, we encourage you to take your nausea medication as directed.  BELOW ARE SYMPTOMS THAT SHOULD BE REPORTED IMMEDIATELY: *FEVER GREATER THAN 100.4 F (38 C) OR HIGHER *CHILLS OR SWEATING *NAUSEA AND VOMITING THAT IS NOT CONTROLLED WITH YOUR NAUSEA MEDICATION *UNUSUAL SHORTNESS OF BREATH *UNUSUAL  BRUISING OR BLEEDING *URINARY PROBLEMS (pain or burning when urinating, or frequent urination) *BOWEL PROBLEMS (unusual diarrhea, constipation, pain near the anus) TENDERNESS IN MOUTH AND THROAT WITH OR WITHOUT PRESENCE OF ULCERS (sore throat, sores in mouth, or a toothache) UNUSUAL RASH, SWELLING OR PAIN  UNUSUAL VAGINAL DISCHARGE OR ITCHING   Items with * indicate a potential emergency and should be followed up as soon as possible or go to the Emergency Department if any problems should occur.  Please show the CHEMOTHERAPY ALERT  CARD or IMMUNOTHERAPY ALERT CARD at check-in to the Emergency Department and triage nurse.  Should you have questions after your visit or need to cancel or reschedule your appointment, please contact [facility] [phone]  and follow the prompts.  Office hours are 8:00 a.m. to 4:30 p.m. Monday - Friday. Please note that voicemails left after 4:00 p.m. may not be returned until the following business day.  We are closed weekends and major holidays. You have access to a nurse at all times for urgent questions. Please call the main number to the clinic [phone] and follow the prompts.  For any non-urgent questions, you may also contact your provider using MyChart. We now offer e-Visits for anyone 64 and older to request care online for non-urgent symptoms. For details visit [email or web address].   Also download the MyChart app! Go to the app store, search "MyChart", open the app, select [facility], and log in with your MyChart username and password.  Masks are optional in the cancer centers. If you would like for your care team to wear a mask while they are taking care of you, please let them know. You may have one support person who is at least 77 years old accompany you for your appointments.

## 2022-01-25 NOTE — Progress Notes (Signed)
Patient presents today for Vidaza D5. Vital signs within parameters for treatment. Patient denies any side effects from treatment. No complaints noted.   Vidaza given today per MD orders. Tolerated infusion without adverse affects. Vital signs stable. No complaints at this time. Discharged from clinic ambulatory in stable condition. Alert and oriented x 3. F/U with [facility] as scheduled.

## 2022-01-28 ENCOUNTER — Inpatient Hospital Stay: Payer: Medicare HMO

## 2022-01-28 VITALS — BP 119/78 | HR 64 | Temp 98.4°F | Resp 16 | Wt 156.4 lb

## 2022-01-28 DIAGNOSIS — D469 Myelodysplastic syndrome, unspecified: Secondary | ICD-10-CM

## 2022-01-28 DIAGNOSIS — Z5111 Encounter for antineoplastic chemotherapy: Secondary | ICD-10-CM | POA: Diagnosis not present

## 2022-01-28 DIAGNOSIS — Z95828 Presence of other vascular implants and grafts: Secondary | ICD-10-CM

## 2022-01-28 MED ORDER — SODIUM CHLORIDE 0.9 % IV SOLN: Freq: Once | INTRAVENOUS | Status: AC

## 2022-01-28 MED ORDER — PALONOSETRON HCL INJECTION 0.25 MG/5ML
0.2500 mg | Freq: Once | INTRAVENOUS | Status: AC
  Administered 2022-01-28: 0.25 mg via INTRAVENOUS
  Filled 2022-01-28: qty 5

## 2022-01-28 MED ORDER — SODIUM CHLORIDE 0.9 % IV SOLN
10.0000 mg | Freq: Once | INTRAVENOUS | Status: AC
  Administered 2022-01-28: 10 mg via INTRAVENOUS
  Filled 2022-01-28: qty 10

## 2022-01-28 MED ORDER — SODIUM CHLORIDE 0.9% FLUSH
10.0000 mL | INTRAVENOUS | Status: DC | PRN
  Administered 2022-01-28: 10 mL

## 2022-01-28 MED ORDER — SODIUM CHLORIDE 0.9 % IV SOLN
75.0000 mg/m2 | Freq: Once | INTRAVENOUS | Status: AC
  Administered 2022-01-28: 140 mg via INTRAVENOUS
  Filled 2022-01-28: qty 14

## 2022-01-28 MED ORDER — HEPARIN SOD (PORK) LOCK FLUSH 100 UNIT/ML IV SOLN
500.0000 [IU] | Freq: Once | INTRAVENOUS | Status: AC | PRN
  Administered 2022-01-28: 500 [IU]

## 2022-01-28 NOTE — Progress Notes (Signed)
Patient presents today for Vidaza infusion per providers order.  Vital signs within parameters for treatment.  Patient has no new complaints at this time.    Treatment given today per MD orders.  Stable during infusion without adverse affects.  Vital signs stable.  No complaints at this time.  Discharge from clinic ambulatory in stable condition.  Alert and oriented X 3.  Follow up with [facility] as scheduled.

## 2022-01-28 NOTE — Patient Instructions (Signed)
[facility]  Discharge Instructions: Thank you for choosing [facility] to provide your oncology and hematology care.  If you have a lab appointment with the [facility], please come in thru the Main Entrance and check in at the main information desk.  Wear comfortable clothing and clothing appropriate for easy access to any Portacath or PICC line.   We strive to give you quality time with your provider. You may need to reschedule your appointment if you arrive late (15 or more minutes).  Arriving late affects you and other patients whose appointments are after yours.  Also, if you miss three or more appointments without notifying the office, you may be dismissed from the clinic at the provider's discretion.      For prescription refill requests, have your pharmacy contact our office and allow 72 hours for refills to be completed.    Today you received the following chemotherapy and/or immunotherapy agents vidaza      To help prevent nausea and vomiting after your treatment, we encourage you to take your nausea medication as directed.  BELOW ARE SYMPTOMS THAT SHOULD BE REPORTED IMMEDIATELY: *FEVER GREATER THAN 100.4 F (38 C) OR HIGHER *CHILLS OR SWEATING *NAUSEA AND VOMITING THAT IS NOT CONTROLLED WITH YOUR NAUSEA MEDICATION *UNUSUAL SHORTNESS OF BREATH *UNUSUAL BRUISING OR BLEEDING *URINARY PROBLEMS (pain or burning when urinating, or frequent urination) *BOWEL PROBLEMS (unusual diarrhea, constipation, pain near the anus) TENDERNESS IN MOUTH AND THROAT WITH OR WITHOUT PRESENCE OF ULCERS (sore throat, sores in mouth, or a toothache) UNUSUAL RASH, SWELLING OR PAIN  UNUSUAL VAGINAL DISCHARGE OR ITCHING   Items with * indicate a potential emergency and should be followed up as soon as possible or go to the Emergency Department if any problems should occur.  Please show the CHEMOTHERAPY ALERT CARD or IMMUNOTHERAPY ALERT CARD at check-in to the Emergency  Department and triage nurse.  Should you have questions after your visit or need to cancel or reschedule your appointment, please contact [facility] [phone]  and follow the prompts.  Office hours are 8:00 a.m. to 4:30 p.m. Monday - Friday. Please note that voicemails left after 4:00 p.m. may not be returned until the following business day.  We are closed weekends and major holidays. You have access to a nurse at all times for urgent questions. Please call the main number to the clinic [phone] and follow the prompts.  For any non-urgent questions, you may also contact your provider using MyChart. We now offer e-Visits for anyone 17 and older to request care online for non-urgent symptoms. For details visit [email or web address].   Also download the MyChart app! Go to the app store, search "MyChart", open the app, select [facility], and log in with your MyChart username and password.  Masks are optional in the cancer centers. If you would like for your care team to wear a mask while they are taking care of you, please let them know. You may have one support person who is at least 77 years old accompany you for your appointments.

## 2022-01-29 ENCOUNTER — Inpatient Hospital Stay: Payer: Medicare HMO

## 2022-01-29 ENCOUNTER — Other Ambulatory Visit: Payer: Self-pay | Admitting: *Deleted

## 2022-01-29 ENCOUNTER — Other Ambulatory Visit (HOSPITAL_COMMUNITY): Payer: Self-pay

## 2022-01-29 VITALS — BP 129/79 | HR 72 | Temp 98.0°F | Resp 16

## 2022-01-29 DIAGNOSIS — Z95828 Presence of other vascular implants and grafts: Secondary | ICD-10-CM

## 2022-01-29 DIAGNOSIS — D469 Myelodysplastic syndrome, unspecified: Secondary | ICD-10-CM

## 2022-01-29 DIAGNOSIS — Z5111 Encounter for antineoplastic chemotherapy: Secondary | ICD-10-CM | POA: Diagnosis not present

## 2022-01-29 MED ORDER — LIDOCAINE-PRILOCAINE 2.5-2.5 % EX CREA: TOPICAL_CREAM | CUTANEOUS | 60 refills | Status: DC | PRN

## 2022-01-29 MED ORDER — SODIUM CHLORIDE 0.9 % IV SOLN
10.0000 mg | Freq: Once | INTRAVENOUS | Status: AC
  Administered 2022-01-29: 10 mg via INTRAVENOUS
  Filled 2022-01-29: qty 10

## 2022-01-29 MED ORDER — SODIUM CHLORIDE 0.9 % IV SOLN
75.0000 mg/m2 | Freq: Once | INTRAVENOUS | Status: AC
  Administered 2022-01-29: 140 mg via INTRAVENOUS
  Filled 2022-01-29: qty 14

## 2022-01-29 MED ORDER — SODIUM CHLORIDE 0.9 % IV SOLN: Freq: Once | INTRAVENOUS | Status: AC

## 2022-01-29 MED ORDER — SODIUM CHLORIDE 0.9% FLUSH
10.0000 mL | INTRAVENOUS | Status: DC | PRN
  Administered 2022-01-29: 10 mL

## 2022-01-29 MED ORDER — HEPARIN SOD (PORK) LOCK FLUSH 100 UNIT/ML IV SOLN
500.0000 [IU] | Freq: Once | INTRAVENOUS | Status: AC | PRN
  Administered 2022-01-29: 500 [IU]

## 2022-01-29 NOTE — Patient Instructions (Signed)
[facility]  Discharge Instructions: Thank you for choosing [facility] to provide your oncology and hematology care.  If you have a lab appointment with the [facility], please come in thru the Main Entrance and check in at the main information desk.  Wear comfortable clothing and clothing appropriate for easy access to any Portacath or PICC line.   We strive to give you quality time with your provider. You may need to reschedule your appointment if you arrive late (15 or more minutes).  Arriving late affects you and other patients whose appointments are after yours.  Also, if you miss three or more appointments without notifying the office, you may be dismissed from the clinic at the provider's discretion.      For prescription refill requests, have your pharmacy contact our office and allow 72 hours for refills to be completed.    Today you received the following chemotherapy and/or immunotherapy agents Vidaza, return as scheduled.   To help prevent nausea and vomiting after your treatment, we encourage you to take your nausea medication as directed.  BELOW ARE SYMPTOMS THAT SHOULD BE REPORTED IMMEDIATELY: *FEVER GREATER THAN 100.4 F (38 C) OR HIGHER *CHILLS OR SWEATING *NAUSEA AND VOMITING THAT IS NOT CONTROLLED WITH YOUR NAUSEA MEDICATION *UNUSUAL SHORTNESS OF BREATH *UNUSUAL BRUISING OR BLEEDING *URINARY PROBLEMS (pain or burning when urinating, or frequent urination) *BOWEL PROBLEMS (unusual diarrhea, constipation, pain near the anus) TENDERNESS IN MOUTH AND THROAT WITH OR WITHOUT PRESENCE OF ULCERS (sore throat, sores in mouth, or a toothache) UNUSUAL RASH, SWELLING OR PAIN  UNUSUAL VAGINAL DISCHARGE OR ITCHING   Items with * indicate a potential emergency and should be followed up as soon as possible or go to the Emergency Department if any problems should occur.  Please show the CHEMOTHERAPY ALERT CARD or IMMUNOTHERAPY ALERT CARD at  check-in to the Emergency Department and triage nurse.  Should you have questions after your visit or need to cancel or reschedule your appointment, please contact [facility] [phone]  and follow the prompts.  Office hours are 8:00 a.m. to 4:30 p.m. Monday - Friday. Please note that voicemails left after 4:00 p.m. may not be returned until the following business day.  We are closed weekends and major holidays. You have access to a nurse at all times for urgent questions. Please call the main number to the clinic [phone] and follow the prompts.  For any non-urgent questions, you may also contact your provider using MyChart. We now offer e-Visits for anyone 88 and older to request care online for non-urgent symptoms. For details visit [email or web address].   Also download the MyChart app! Go to the app store, search "MyChart", open the app, select [facility], and log in with your MyChart username and password.  Masks are optional in the cancer centers. If you would like for your care team to wear a mask while they are taking care of you, please let them know. You may have one support person who is at least 77 years old accompany you for your appointments.

## 2022-01-29 NOTE — Progress Notes (Signed)
Patient tolerated chemotherapy with no complaints voiced. Side effects with management reviewed understanding verbalized. Port site clean and dry with no bruising or swelling noted at site. Good blood return noted before and after administration of chemotherapy. Band aid applied. Patient left in satisfactory condition with VSS and no s/s of distress noted.

## 2022-01-30 ENCOUNTER — Other Ambulatory Visit (HOSPITAL_COMMUNITY): Payer: Self-pay

## 2022-01-30 NOTE — Telephone Encounter (Signed)
Oral Chemotherapy Pharmacist Encounter  Chad Lamb (Specialty) will have his medication delivered on 02/04/22. Chad Lamb will begin taking his Venclexta on 02/18/22 along with his next round of azacitidine.   Patient Education I spoke with patient for overview of new oral chemotherapy medication:Venclexta (venetoclax) for the treatment of MDS in conjunction with azacitidine (existing therapy), planned duration until disease control or unacceptable drug toxicity. Planned start with next cycle on 02/18/22.  Pt is doing well. Counseled patient on administration, dosing, side effects, monitoring, drug-food interactions, safe handling, storage, and disposal. Patient will take 2 tablets (200 mg total) by mouth daily. Take for 14 days, then hold for 14 days. Repeat every 28 days. Take with a meal and a full glass of water.  Side effects include but not limited to: diarrhea, nausea, fatigue, decreased wbc/hgb/plt.    Reviewed with patient importance of keeping a medication schedule and plan for any missed doses.  After discussion with patient no patient barriers to medication adherence identified.   Chad Lamb voiced understanding and appreciation. All questions answered. Medication handout provided.  Provided patient with [facility] phone number. Patient knows to call the office with questions or concerns. Oral Chemotherapy Navigation Clinic will continue to follow.  Darl Pikes, PharmD, BCPS, BCOP, CPP Hematology/Oncology Clinical Pharmacist Practitioner [facility] [phone]  01/30/2022 3:45 PM

## 2022-01-31 ENCOUNTER — Other Ambulatory Visit (HOSPITAL_COMMUNITY): Payer: Self-pay

## 2022-02-18 ENCOUNTER — Inpatient Hospital Stay: Payer: Medicare HMO | Attending: Hematology

## 2022-02-18 ENCOUNTER — Inpatient Hospital Stay: Payer: Medicare HMO

## 2022-02-18 ENCOUNTER — Inpatient Hospital Stay (HOSPITAL_BASED_OUTPATIENT_CLINIC_OR_DEPARTMENT_OTHER): Payer: Medicare HMO | Admitting: Hematology

## 2022-02-18 VITALS — BP 105/69 | HR 72 | Temp 98.3°F | Resp 18

## 2022-02-18 DIAGNOSIS — D469 Myelodysplastic syndrome, unspecified: Secondary | ICD-10-CM | POA: Diagnosis not present

## 2022-02-18 DIAGNOSIS — Z95828 Presence of other vascular implants and grafts: Secondary | ICD-10-CM

## 2022-02-18 DIAGNOSIS — D4622 Refractory anemia with excess of blasts 2: Secondary | ICD-10-CM | POA: Insufficient documentation

## 2022-02-18 DIAGNOSIS — Z5111 Encounter for antineoplastic chemotherapy: Secondary | ICD-10-CM | POA: Diagnosis present

## 2022-02-18 LAB — CBC WITH DIFFERENTIAL/PLATELET
Abs Immature Granulocytes: 0.01 10*3/uL (ref 0.00–0.07)
Basophils Absolute: 0 10*3/uL (ref 0.0–0.1)
Basophils Relative: 1 %
Eosinophils Absolute: 0.1 10*3/uL (ref 0.0–0.5)
Eosinophils Relative: 4 %
HCT: 32.8 % — ABNORMAL LOW (ref 39.0–52.0)
Hemoglobin: 10.9 g/dL — ABNORMAL LOW (ref 13.0–17.0)
Immature Granulocytes: 1 %
Lymphocytes Relative: 48 %
Lymphs Abs: 0.7 10*3/uL (ref 0.7–4.0)
MCH: 30.6 pg (ref 26.0–34.0)
MCHC: 33.2 g/dL (ref 30.0–36.0)
MCV: 92.1 fL (ref 80.0–100.0)
Monocytes Absolute: 0.1 10*3/uL (ref 0.1–1.0)
Monocytes Relative: 4 %
Neutro Abs: 0.6 10*3/uL — ABNORMAL LOW (ref 1.7–7.7)
Neutrophils Relative %: 42 %
Platelets: 159 10*3/uL (ref 150–400)
RBC: 3.56 MIL/uL — ABNORMAL LOW (ref 4.22–5.81)
RDW: 16.9 % — ABNORMAL HIGH (ref 11.5–15.5)
WBC: 1.4 10*3/uL — CL (ref 4.0–10.5)
nRBC: 0 % (ref 0.0–0.2)

## 2022-02-18 LAB — COMPREHENSIVE METABOLIC PANEL
ALT: 12 U/L (ref 0–44)
AST: 15 U/L (ref 15–41)
Albumin: 3.4 g/dL — ABNORMAL LOW (ref 3.5–5.0)
Alkaline Phosphatase: 78 U/L (ref 38–126)
Anion gap: 8 (ref 5–15)
BUN: 11 mg/dL (ref 8–23)
CO2: 24 mmol/L (ref 22–32)
Calcium: 9.1 mg/dL (ref 8.9–10.3)
Chloride: 104 mmol/L (ref 98–111)
Creatinine, Ser: 0.9 mg/dL (ref 0.61–1.24)
GFR, Estimated: >60 mL/min (ref >60)
Glucose, Bld: 110 mg/dL — ABNORMAL HIGH (ref 70–99)
Potassium: 3.7 mmol/L (ref 3.5–5.1)
Sodium: 136 mmol/L (ref 135–145)
Total Bilirubin: 1.6 mg/dL — ABNORMAL HIGH (ref 0.3–1.2)
Total Protein: 6.5 g/dL (ref 6.5–8.1)

## 2022-02-18 LAB — MAGNESIUM: Magnesium: 2 mg/dL (ref 1.7–2.4)

## 2022-02-18 MED ORDER — SODIUM CHLORIDE 0.9 % IV SOLN
75.0000 mg/m2 | Freq: Once | INTRAVENOUS | Status: AC
  Administered 2022-02-18: 140 mg via INTRAVENOUS
  Filled 2022-02-18: qty 14

## 2022-02-18 MED ORDER — HEPARIN SOD (PORK) LOCK FLUSH 100 UNIT/ML IV SOLN
500.0000 [IU] | Freq: Once | INTRAVENOUS | Status: AC | PRN
  Administered 2022-02-18: 500 [IU]

## 2022-02-18 MED ORDER — SODIUM CHLORIDE 0.9% FLUSH
10.0000 mL | INTRAVENOUS | Status: DC | PRN
  Administered 2022-02-18: 10 mL

## 2022-02-18 MED ORDER — PALONOSETRON HCL INJECTION 0.25 MG/5ML
0.2500 mg | Freq: Once | INTRAVENOUS | Status: AC
  Administered 2022-02-18: 0.25 mg via INTRAVENOUS
  Filled 2022-02-18: qty 5

## 2022-02-18 MED ORDER — SODIUM CHLORIDE 0.9 % IV SOLN
10.0000 mg | Freq: Once | INTRAVENOUS | Status: AC
  Administered 2022-02-18: 10 mg via INTRAVENOUS
  Filled 2022-02-18: qty 10

## 2022-02-18 MED ORDER — SODIUM CHLORIDE 0.9 % IV SOLN: Freq: Once | INTRAVENOUS | Status: AC

## 2022-02-18 NOTE — Progress Notes (Signed)
ANC 0.6, Bilirubin 1.6, Patient okay for treatment per Dr. Delton Coombes.  Patient tolerated chemotherapy with no complaints voiced. Side effects with management reviewed understanding verbalized. Port site clean and dry with no bruising or swelling noted at site. Good blood return noted before and after administration of chemotherapy. Band aid applied. Patient left in satisfactory condition with VSS and no s/s of distress noted.

## 2022-02-18 NOTE — Progress Notes (Signed)
[place], [place] [address]   CLINIC:  Medical Oncology/Hematology  PCP:  [name], [place] / [place] [address] [phone]   REASON FOR VISIT:  Follow-up for MDS with EB 2  PRIOR THERAPY: none  NGS Results: ASXL 1 mutation  CURRENT THERAPY: Azacitidine IV D1-7 q28d (started 06/18/21)  BRIEF ONCOLOGIC HISTORY:  Oncology History  Myelodysplastic syndrome ([facility])  06/17/2021 Initial Diagnosis   Myelodysplastic syndrome ([facility])   07/30/2021 - 10/30/2021 Chemotherapy   Patient is on Treatment Plan : MYELODYSPLASIA  Azacitidine IV D1-7 q28d     07/30/2021 -  Chemotherapy   Patient is on Treatment Plan : MYELODYSPLASIA  Azacitidine IV D1-7 q28d       CANCER STAGING:  Cancer Staging  No matching staging information was found for the patient.  INTERVAL HISTORY:  Mr. KAESON KLEINERT, a 76 y.o. male, seen for follow-up of MDS.  Cycle 7 treatment was given on 01/21/2022.  He reports energy levels are 50%.  Chronic cough and shortness of breath from asthma are stable.  C7 of venetoclax and azacitidine was on 01/21/2022.  REVIEW OF SYSTEMS:  Review of Systems  Constitutional:  Negative for appetite change and fatigue.  HENT:   Negative for nosebleeds and trouble swallowing.   Respiratory:  Positive for cough and shortness of breath (Only on exertion while swimming). Negative for hemoptysis.   Gastrointestinal:  Positive for constipation. Negative for blood in stool and nausea.  Genitourinary:  Negative for hematuria.   Hematological:  Does not bruise/bleed easily.  Psychiatric/Behavioral:  Positive for sleep disturbance. The patient is not nervous/anxious.   All other systems reviewed and are negative.   PAST MEDICAL/SURGICAL HISTORY:  Past Medical History:  Diagnosis Date   Allergy    Anxiety    Arthritis    Asthma    Cataract    Clotting disorder ([facility])    DVT (deep venous thrombosis) (HCC)    unknown etiology. 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[facility]) 06/17/2021   Pneumonia    Port-A-Cath in place 07/30/2021   Past Surgical History:  Procedure Laterality Date   CATARACT EXTRACTION W/PHACO Right 11/24/2017   Procedure: CATARACT EXTRACTION PHACO AND INTRAOCULAR LENS PLACEMENT RIGHT EYE;  Surgeon: [name], MD;  Location: [facility];  Service: Ophthalmology;  Laterality: Right;  CDE: 8.61   CATARACT EXTRACTION W/PHACO Left 12/29/2017   Procedure: CATARACT EXTRACTION PHACO AND INTRAOCULAR LENS PLACEMENT (IOC);  Surgeon: [name], MD;  Location: [facility];  Service: Ophthalmology;  Laterality: Left;  CDE: 7.21   GANGLION CYST EXCISION     HERNIA REPAIR     bilateral   PARS PLANA VITRECTOMY Left 05/17/2021   Procedure: PARS PLANA VITRECTOMY 25 GAUGE WITH ANTIBIOTIC INJECTION  AND ENDOLASER LEFT EYE;  Surgeon: [name], MD;  Location: [facility];  Service: Ophthalmology;  Laterality: Left;   PORTACATH PLACEMENT Right 07/09/2021   Procedure: INSERTION PORT-A-CATH;  Surgeon: Aviva Signs, MD;  Location: [facility];  Service: General;  Laterality: Right;   TONSILLECTOMY     VASCULAR SURGERY      SOCIAL HISTORY:  Social History   Socioeconomic History   Marital status: Married    Spouse name: [name]   Number of children: 0   Years of education: 9  Highest education level: High school graduate  Occupational History   Not on file  Tobacco Use   Smoking status: Former    Types: Pipe    Quit date: 03/21/1966    Years since quitting: 55.9   Smokeless tobacco: Never  Vaping Use   Vaping Use: Never used  Substance and Sexual Activity   Alcohol use: Yes    Alcohol/week: 1.0 standard drink of alcohol    Types: 1 Cans of beer per week    Comment: 1 can of beer a week   Drug use: Never   Sexual activity: Not Currently  Other Topics Concern   Not on file  Social History  Narrative   ** Merged History Encounter **       Grew up in [place], [place] and [place].  Retired.  Worked in [employer]. 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    Social Determinants of Health   Financial Resource Strain: Low Risk  (01/23/2017)   Overall Financial Resource Strain (CARDIA)    Difficulty of Paying Living Expenses: Not hard at all  Food Insecurity: No Food Insecurity (01/23/2017)   Hunger Vital Sign    Worried About Running Out of Food in the Last Year: Never true    Ran Out of Food in the Last Year: Never true  Transportation Needs: No Transportation Needs (01/23/2017)   PRAPARE - Hydrologist (Medical): No    Lack of Transportation (Non-Medical): No  Physical Activity: Unknown (01/23/2017)   Exercise Vital Sign    Days of Exercise per Week: 3 days    Minutes of Exercise per Session: Not on file  Stress: Stress Concern Present (01/23/2017)   [facility]    Feeling of Stress : To some extent  Social Connections: Somewhat Isolated (01/23/2017)   Social Connection and Isolation Panel [NHANES]    Frequency of Communication with Friends and Family: More than three times a week    Frequency of Social Gatherings with Friends and Family: More than three times a week    Attends Religious Services: Never    Marine scientist or Organizations: No    Attends Archivist Meetings: Never    Marital Status: Married  Human resources officer Violence: Not At Risk (01/23/2017)   Humiliation, Afraid, Rape, and Kick questionnaire    Fear of Current or Ex-Partner: No    Emotionally Abused: No    Physically Abused: No    Sexually Abused: No    FAMILY HISTORY:  Family History  Problem Relation Age of Onset   Cancer Mother        pancreatic   Arthritis Father    Early death Father 12       Lung infiltrate   Colon cancer Neg Hx    Colon  polyps Neg Hx     CURRENT MEDICATIONS:  Current Outpatient Medications  Medication Sig Dispense Refill   albuterol (PROVENTIL HFA;VENTOLIN HFA) 108 (90 Base) MCG/ACT inhaler Inhale 1-2 puffs into the lungs every 6 (six) hours as needed for wheezing or shortness of breath.     ALPRAZolam (XANAX) 0.5 MG tablet Take 0.5 mg by mouth 2 (two) times daily as needed for anxiety.     amLODipine (NORVASC) 2.5 MG tablet Take 2.5 mg by mouth daily.     azaCITIDine 5 mg/2 mLs in lactated ringers infusion Inject into the vein daily. Days 1-7  every 28 days     fluticasone furoate-vilanterol (BREO ELLIPTA) 200-25 MCG/ACT AEPB Inhale 1 puff into the lungs daily.     Lidocaine-Hydrocort, Perianal, 3-0.5 % CREA Apply topically 2 (two) times daily as needed.     Lidocaine-Hydrocortisone Ace 3-0.5 % CREA Apply 1 application topically as directed. (Patient taking differently: Apply 1 application  topically 2 (two) times daily as needed (hemorrhoids).) 30 Tube 11   lidocaine-prilocaine (EMLA) cream Apply topically as needed. Apply to port site prior to infusion as needed 30 g 60   loratadine (CLARITIN) 10 MG tablet Take 10 mg by mouth daily.     Lutein 40 MG CAPS Take 40 mg by mouth daily.     Multiple Vitamins-Minerals (CENTRUM ADULTS PO) Take 1 tablet by mouth daily.     nystatin cream (MYCOSTATIN) Apply 1 application. topically 2 (two) times daily as needed for dry skin.     ofloxacin (OCUFLOX) 0.3 % ophthalmic solution Place 1 drop into the left eye See admin instructions. 1 drop to left eye 4 x daily for 7 days after monthly procedure.     omeprazole (PRILOSEC OTC) 20 MG tablet Take 5-10 mg by mouth daily.     sildenafil (VIAGRA) 50 MG tablet Take 50 mg by mouth daily as needed for erectile dysfunction.     traMADol (ULTRAM) 50 MG tablet Take 1 tablet (50 mg total) by mouth every 6 (six) hours as needed. 15 tablet 0   venetoclax (VENCLEXTA) 100 MG tablet Take 2 tablets (200 mg total) by mouth daily. Take for 14  days, then hold for 14 days. Repeat every 28 days. Take with a meal and a full glass of water. 28 tablet 0   No current facility-administered medications for this visit.    ALLERGIES:  Allergies  Allergen Reactions   Cephalosporins Itching   Amoxapine And Related Itching and Rash    Has patient had a PCN reaction causing immediate rash, facial/tongue/throat swelling, SOB or lightheadedness with hypotension: No Has patient had a PCN reaction causing severe rash involving mucus membranes or skin necrosis: No Has patient had a PCN reaction that required hospitalization: No Has patient had a PCN reaction occurring within the last 10 years: No If all of the above answers are "NO", then may proceed with Cephalosporin use.     Amoxicillin Rash   Cephalexin Rash   Clindamycin/Lincomycin Itching and Rash   Sulfamethoxazole Itching and Rash   Trimethoprim Rash    PHYSICAL EXAM:  Performance status (ECOG): 1 - Symptomatic but completely ambulatory  There were no vitals filed for this visit. Wt Readings from Last 3 Encounters:  02/18/22 152 lb 6.4 oz (69.1 kg)  01/28/22 156 lb 6.4 oz (70.9 kg)  01/21/22 160 lb (72.6 kg)   Physical Exam Vitals reviewed.  Constitutional:      Appearance: Normal appearance.  Cardiovascular:     Heart sounds: Normal heart sounds.  Pulmonary:     Effort: Pulmonary effort is normal.     Breath sounds: Normal breath sounds.  Neurological:     Mental Status: He is alert and oriented to person, place, and time.  Psychiatric:        Mood and Affect: Mood normal.        Behavior: Behavior normal.    LABORATORY DATA:  I have reviewed the labs as listed.     Latest Ref Rng & Units 02/18/2022   11:26 AM 01/21/2022   10:30 AM 01/04/2022    7:59  AM  CBC  WBC 4.0 - 10.5 K/uL 1.4  2.1  1.8   Hemoglobin 13.0 - 17.0 g/dL 10.9  11.1  11.1   Hematocrit 39.0 - 52.0 % 32.8  32.7  33.2   Platelets 150 - 400 K/uL 159  147  96       Latest Ref Rng & Units  02/18/2022   11:26 AM 01/21/2022   10:30 AM 12/17/2021    9:12 AM  CMP  Glucose 70 - 99 mg/dL 110  99  104   BUN 8 - 23 mg/dL _0 Creatinine 0.61 - 1.24 mg/dL 0.90  0.82  0.95   Sodium 135 - 145 mmol/L 136  138  138   Potassium 3.5 - 5.1 mmol/L 3.7  3.8  3.5   Chloride 98 - 111 mmol/L 104  105  107   CO2 22 - 32 mmol/L _1 Calcium 8.9 - 10.3 mg/dL 9.1  9.2  9.2   Total Protein 6.5 - 8.1 g/dL 6.5  6.3  6.1   Total Bilirubin 0.3 - 1.2 mg/dL 1.6  1.1  1.9   Alkaline Phos 38 - 126 U/L 78  80  62   AST 15 - 41 U/L _2 ALT 0 - 44 U/L _3 DIAGNOSTIC IMAGING:  I have independently reviewed the scans and discussed with the patient. No results found.   ASSESSMENT:  MDS with EB 2: - Seen at the request of Dr. Mannie Stabile for abnormal CBC. - Labs on 12/06/2020 shows white count 3.0 with 41% neutrophils, 41% lymphocytes, 13.8% monocytes.  ANC was 1.2.  Hemoglobin 13.4 and normal.  MCV was slightly high at 96.  Platelet count was 128.  Vitamin H84 and folic acid were normal. - He took antibiotics for teeth implants few times this year, last 1 on 11/16/2020.  He does not know the antibiotic name.  He also reports taking over-the-counter pill for memory for 5 to 6 months, which was stopped around September 2022. - Labs from 07/07/2020 with creatinine 0.92.  White count 3.3 (37% neutrophils, 45% lymphocytes, 14% monocytes, 3% eosinophils), ANC 1.2.  Platelet count 149. - His prior CBC from 12/08/2017 in our system was completely normal. - Reports slight worsening of the night sweats in the last 3 months.  At least once per week and has to change clothes.  No fevers or weight loss. - BMBX on 06/04/2021: Hypercellular marrow with dyspoietic changes involving the granulocytic cell line and megakaryocytes.  Myeloblasts 8% on aspirate smears and 10% by flow.  Cytogenetics 23, XY (20).  MDS FISH panel normal. 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 - IPSS-M score of 0.12, moderate  high risk.  Leukemia free survival is 2.3 years.  Overall survival 2.8 years. - Cycle 1 of azacitidine on 07/30/2021, venetoclax 200 mg 2 weeks on/2 weeks of added with cycle 8   Social/family history: - He lives at home with his wife.  He swims about 40 minutes daily. 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  Non-smoker. - Paternal aunt had breast cancer and mother had pancreatic cancer.  3.  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 - He underwent thrombolytic therapy at [facility]. - He will followed up with vascular at  [facility] and has been on Xarelto since then.   PLAN:  MDS with EB 2: - I have reviewed his labs today which showed normal LFTs and creatinine.  White count is 1.4, platelet count 159.  ANC is 0.6. - He will proceed with cycle 8 of azacitidine today. - He was instructed to start venetoclax 200 mg daily, 2 weeks on/2 weeks off.  We discussed side effects of venetoclax in detail. - RTC 2 weeks with repeat labs including uric acid.   Unprovoked left subclavian DVT: - Xarelto discontinued due to hemorrhoidal bleeding.  3.  Constipation: - This is partly from Aloxi.  Continue Colace 3 times daily and MiraLAX at bedtime.   Orders placed this encounter:  No orders of the defined types were placed in this encounter.     [name], MD [facility] [phone]

## 2022-02-18 NOTE — Progress Notes (Signed)
Patients port flushed without difficulty.  Good blood return noted with no bruising or swelling noted at site.  Stable during access and blood draw.  Patient to remain accessed for treatment.

## 2022-02-18 NOTE — Patient Instructions (Addendum)
[facility]  Discharge Instructions  You were seen and examined today by Dr. Delton Coombes.  Proceed with treatment as planned. Please start Venetoclax also.  Follow-up as scheduled.  Thank you for choosing [facility] to provide your oncology and hematology care.   To afford each patient quality time with our provider, please arrive at least 15 minutes before your scheduled appointment time. You may need to reschedule your appointment if you arrive late (10 or more minutes). Arriving late affects you and other patients whose appointments are after yours.  Also, if you miss three or more appointments without notifying the office, you may be dismissed from the clinic at the provider's discretion.    Again, thank you for choosing [facility].  Our hope is that these requests will decrease the amount of time that you wait before being seen by our physicians.   If you have a lab appointment with the [facility] please come in thru the Main Entrance and check in at the main information desk.           _____________________________________________________________  Should you have questions after your visit to [facility], please contact our office at [phone] and follow the prompts.  Our office hours are 8:00 a.m. to 4:30 p.m. Monday - Thursday and 8:00 a.m. to 2:30 p.m. Friday.  Please note that voicemails left after 4:00 p.m. may not be returned until the following business day.  We are closed weekends and all major holidays.  You do have access to a nurse 24-7, just call the main number to the clinic [phone] and do not press any options, hold on the line and a nurse will answer the phone.    For prescription refill requests, have your pharmacy contact our office and allow 72 hours.    Masks are optional in the cancer centers. If you would like for your care team to wear a mask while they are taking care of  you, please let them know. You may have one support person who is at least 77 years old accompany you for your appointments.

## 2022-02-18 NOTE — Progress Notes (Signed)
Patient has been assessed, vital signs and labs have been reviewed by Dr. Delton Coombes. ANC, Creatinine, LFTs, and Platelets are within treatment parameters per Dr. Delton Coombes. The patient is good to proceed with treatment at this time. Primary RN and pharmacy aware.

## 2022-02-18 NOTE — Patient Instructions (Signed)
[facility]  Discharge Instructions: Thank you for choosing [facility] to provide your oncology and hematology care.  If you have a lab appointment with the [facility], please come in thru the Main Entrance and check in at the main information desk.  Wear comfortable clothing and clothing appropriate for easy access to any Portacath or PICC line.   We strive to give you quality time with your provider. You may need to reschedule your appointment if you arrive late (15 or more minutes).  Arriving late affects you and other patients whose appointments are after yours.  Also, if you miss three or more appointments without notifying the office, you may be dismissed from the clinic at the provider's discretion.      For prescription refill requests, have your pharmacy contact our office and allow 72 hours for refills to be completed.    Today you received the following chemotherapy and/or immunotherapy agents Vidaza, return as scheduled.   To help prevent nausea and vomiting after your treatment, we encourage you to take your nausea medication as directed.  BELOW ARE SYMPTOMS THAT SHOULD BE REPORTED IMMEDIATELY: *FEVER GREATER THAN 100.4 F (38 C) OR HIGHER *CHILLS OR SWEATING *NAUSEA AND VOMITING THAT IS NOT CONTROLLED WITH YOUR NAUSEA MEDICATION *UNUSUAL SHORTNESS OF BREATH *UNUSUAL BRUISING OR BLEEDING *URINARY PROBLEMS (pain or burning when urinating, or frequent urination) *BOWEL PROBLEMS (unusual diarrhea, constipation, pain near the anus) TENDERNESS IN MOUTH AND THROAT WITH OR WITHOUT PRESENCE OF ULCERS (sore throat, sores in mouth, or a toothache) UNUSUAL RASH, SWELLING OR PAIN  UNUSUAL VAGINAL DISCHARGE OR ITCHING   Items with * indicate a potential emergency and should be followed up as soon as possible or go to the Emergency Department if any problems should occur.  Please show the CHEMOTHERAPY ALERT CARD or IMMUNOTHERAPY ALERT CARD at  check-in to the Emergency Department and triage nurse.  Should you have questions after your visit or need to cancel or reschedule your appointment, please contact [facility] [phone]  and follow the prompts.  Office hours are 8:00 a.m. to 4:30 p.m. Monday - Friday. Please note that voicemails left after 4:00 p.m. may not be returned until the following business day.  We are closed weekends and major holidays. You have access to a nurse at all times for urgent questions. Please call the main number to the clinic [phone] and follow the prompts.  For any non-urgent questions, you may also contact your provider using MyChart. We now offer e-Visits for anyone 84 and older to request care online for non-urgent symptoms. For details visit [email or web address].   Also download the MyChart app! Go to the app store, search "MyChart", open the app, select [facility], and log in with your MyChart username and password.  Masks are optional in the cancer centers. If you would like for your care team to wear a mask while they are taking care of you, please let them know. You may have one support person who is at least 77 years old accompany you for your appointments.

## 2022-02-18 NOTE — Progress Notes (Signed)
CRITICAL VALUE STICKER  CRITICAL VALUE: WBC is 1.4  DATE & TIME NOTIFIED: 02/18/2022 @ 12:33   MESSENGER (representative from lab): Lisabeth Pick  MD NOTIFIED: [name], MD  TIME OF NOTIFICATION: 12:35  RESPONSE: He is seeing him in clinic today and will discuss at his appointment.

## 2022-02-19 ENCOUNTER — Inpatient Hospital Stay: Payer: Medicare HMO

## 2022-02-19 VITALS — BP 121/65 | HR 69 | Temp 98.6°F | Resp 16

## 2022-02-19 DIAGNOSIS — Z5111 Encounter for antineoplastic chemotherapy: Secondary | ICD-10-CM | POA: Diagnosis not present

## 2022-02-19 DIAGNOSIS — D469 Myelodysplastic syndrome, unspecified: Secondary | ICD-10-CM

## 2022-02-19 DIAGNOSIS — Z95828 Presence of other vascular implants and grafts: Secondary | ICD-10-CM

## 2022-02-19 MED ORDER — HEPARIN SOD (PORK) LOCK FLUSH 100 UNIT/ML IV SOLN
500.0000 [IU] | Freq: Once | INTRAVENOUS | Status: AC | PRN
  Administered 2022-02-19: 500 [IU]

## 2022-02-19 MED ORDER — SODIUM CHLORIDE 0.9 % IV SOLN
10.0000 mg | Freq: Once | INTRAVENOUS | Status: AC
  Administered 2022-02-19: 10 mg via INTRAVENOUS
  Filled 2022-02-19: qty 10

## 2022-02-19 MED ORDER — SODIUM CHLORIDE 0.9% FLUSH
10.0000 mL | INTRAVENOUS | Status: DC | PRN
  Administered 2022-02-19: 10 mL

## 2022-02-19 MED ORDER — SODIUM CHLORIDE 0.9 % IV SOLN: Freq: Once | INTRAVENOUS | Status: AC

## 2022-02-19 MED ORDER — SODIUM CHLORIDE 0.9 % IV SOLN
75.0000 mg/m2 | Freq: Once | INTRAVENOUS | Status: AC
  Administered 2022-02-19: 140 mg via INTRAVENOUS
  Filled 2022-02-19: qty 14

## 2022-02-19 NOTE — Progress Notes (Signed)
Patient presents today for Vidaza infusion.  Patient is in satisfactory condition with no new complaints voiced.  Vital signs are stable.  We will proceed with treatment per MD orders.  Patient tolerated treatment well with no complaints voiced.  Patient left ambulatory in stable condition.  Vital signs stable at discharge.  Follow up as scheduled.

## 2022-02-19 NOTE — Patient Instructions (Signed)
[facility]  Discharge Instructions: Thank you for choosing [facility] to provide your oncology and hematology care.  If you have a lab appointment with the [facility], please come in thru the Main Entrance and check in at the main information desk.  Wear comfortable clothing and clothing appropriate for easy access to any Portacath or PICC line.   We strive to give you quality time with your provider. You may need to reschedule your appointment if you arrive late (15 or more minutes).  Arriving late affects you and other patients whose appointments are after yours.  Also, if you miss three or more appointments without notifying the office, you may be dismissed from the clinic at the provider's discretion.      For prescription refill requests, have your pharmacy contact our office and allow 72 hours for refills to be completed.    Today you received the following chemotherapy and/or immunotherapy agents Vidaza.  Azacitidine Injection What is this medication? AZACITIDINE (ay [place]) treats blood and bone marrow cancers. It works by slowing down the growth of cancer cells. This medicine may be used for other purposes; ask your health care provider or pharmacist if you have questions. COMMON BRAND NAME(S): Vidaza What should I tell my care team before I take this medication? They need to know if you have any of these conditions: Kidney disease Liver disease Low blood cell levels, such as low white cells, platelets, or red blood cells Low levels of albumin in the blood Low levels of bicarbonate in the blood An unusual or allergic reaction to azacitidine, mannitol, other medications, foods, dyes, or preservatives If you or your partner are pregnant or trying to get pregnant Breast-feeding How should I use this medication? This medication is injected into a vein or under the skin. It is given by your care team in a hospital or clinic  setting. Talk to your care team about the use of this medication in children. While it may be prescribed for children as young as 1 month for selected conditions, precautions do apply. Overdosage: If you think you have taken too much of this medicine contact a poison control center or emergency room at once. NOTE: This medicine is only for you. Do not share this medicine with others. What if I miss a dose? Keep appointments for follow-up doses. It is important not to miss your dose. Call your care team if you are unable to keep an appointment. What may interact with this medication? Interactions are not expected. This list may not describe all possible interactions. Give your health care provider a list of all the medicines, herbs, non-prescription drugs, or dietary supplements you use. Also tell them if you smoke, drink alcohol, or use illegal drugs. Some items may interact with your medicine. What should I watch for while using this medication? Your condition will be monitored carefully while you are receiving this medication. You may need blood work while taking this medication. This medication may make you feel generally unwell. This is not uncommon as chemotherapy can affect healthy cells as well as cancer cells. Report any side effects. Continue your course of treatment even though you feel ill unless your care team tells you to stop. Other product types may be available that contain the medication azacitidine. The injection and oral products should not be used in place of one another. Talk to your care team if you have questions. This medication can cause serious side  effects. To reduce the risk, your care team may give you other medications to take before receiving this one. Be sure to follow the directions from your care team. This medication may increase your risk of getting an infection. Call your care team for advice if you get a fever, chills, sore throat, or other symptoms of a cold or  flu. Do not treat yourself. Try to avoid being around people who are sick. Avoid taking medications that contain aspirin, acetaminophen, ibuprofen, naproxen, or ketoprofen unless instructed by your care team. These medications may hide a fever. This medication may increase your risk to bruise or bleed. Call your care team if you notice any unusual bleeding. Be careful brushing or flossing your teeth or using a toothpick because you may get an infection or bleed more easily. If you have any dental work done, tell your dentist you are receiving this medication. Talk to your care team if you or your partner may be pregnant. Serious birth defects can occur if you take this medication during pregnancy and for 6 months after the last dose. You will need a negative pregnancy test before starting this medication. Contraception is recommended while taking his medication and for 6 months after the last dose. Your care team can help you find the option that works for you. If your partner can get pregnant, use a condom during sex while taking this medication and for 3 months after the last dose. Do not breastfeed while taking this medication and for 1 week after the last dose. This medication may cause infertility. Talk to your care team if you are concerned about your fertility. What side effects may I notice from receiving this medication? Side effects that you should report to your care team as soon as possible: Allergic reactions--skin rash, itching, hives, swelling of the face, lips, tongue, or throat Infection--fever, chills, cough, sore throat, wounds that don't heal, pain or trouble when passing urine, general feeling of discomfort or being unwell Kidney injury--decrease in the amount of urine, swelling of the ankles, hands, or feet Liver injury--right upper belly pain, loss of appetite, nausea, light-colored stool, dark yellow or Costantino Kohlbeck urine, yellowing skin or eyes, unusual weakness or fatigue Low red  blood cell level--unusual weakness or fatigue, dizziness, headache, trouble breathing Tumor lysis syndrome (TLS)--nausea, vomiting, diarrhea, decrease in the amount of urine, dark urine, unusual weakness or fatigue, confusion, muscle pain or cramps, fast or irregular heartbeat, joint pain Unusual bruising or bleeding Side effects that usually do not require medical attention (report to your care team if they continue or are bothersome): Constipation Diarrhea Nausea Pain, redness, or irritation at injection site Vomiting This list may not describe all possible side effects. Call your doctor for medical advice about side effects. You may report side effects to FDA at 1-800-FDA-1088. Where should I keep my medication? This medication is given in a hospital or clinic. It will not be stored at home. NOTE: This sheet is a summary. It may not cover all possible information. If you have questions about this medicine, talk to your doctor, pharmacist, or health care provider.  2023 Elsevier/Gold Standard (2021-07-19 00:00:00)        To help prevent nausea and vomiting after your treatment, we encourage you to take your nausea medication as directed.  BELOW ARE SYMPTOMS THAT SHOULD BE REPORTED IMMEDIATELY: *FEVER GREATER THAN 100.4 F (38 C) OR HIGHER *CHILLS OR SWEATING *NAUSEA AND VOMITING THAT IS NOT CONTROLLED WITH YOUR NAUSEA MEDICATION *UNUSUAL SHORTNESS OF  BREATH *UNUSUAL BRUISING OR BLEEDING *URINARY PROBLEMS (pain or burning when urinating, or frequent urination) *BOWEL PROBLEMS (unusual diarrhea, constipation, pain near the anus) TENDERNESS IN MOUTH AND THROAT WITH OR WITHOUT PRESENCE OF ULCERS (sore throat, sores in mouth, or a toothache) UNUSUAL RASH, SWELLING OR PAIN  UNUSUAL VAGINAL DISCHARGE OR ITCHING   Items with * indicate a potential emergency and should be followed up as soon as possible or go to the Emergency Department if any problems should occur.  Please show the  CHEMOTHERAPY ALERT CARD or IMMUNOTHERAPY ALERT CARD at check-in to the Emergency Department and triage nurse.  Should you have questions after your visit or need to cancel or reschedule your appointment, please contact [facility] [phone]  and follow the prompts.  Office hours are 8:00 a.m. to 4:30 p.m. Monday - Friday. Please note that voicemails left after 4:00 p.m. may not be returned until the following business day.  We are closed weekends and major holidays. You have access to a nurse at all times for urgent questions. Please call the main number to the clinic [phone] and follow the prompts.  For any non-urgent questions, you may also contact your provider using MyChart. We now offer e-Visits for anyone 32 and older to request care online for non-urgent symptoms. For details visit [email or web address].   Also download the MyChart app! Go to the app store, search "MyChart", open the app, select [facility], and log in with your MyChart username and password.  Masks are optional in the cancer centers. If you would like for your care team to wear a mask while they are taking care of you, please let them know. You may have one support person who is at least 77 years old accompany you for your appointments.

## 2022-02-20 ENCOUNTER — Inpatient Hospital Stay: Payer: Medicare HMO

## 2022-02-20 VITALS — BP 112/71 | HR 62 | Temp 97.7°F | Resp 18

## 2022-02-20 DIAGNOSIS — Z5111 Encounter for antineoplastic chemotherapy: Secondary | ICD-10-CM | POA: Diagnosis not present

## 2022-02-20 DIAGNOSIS — D469 Myelodysplastic syndrome, unspecified: Secondary | ICD-10-CM

## 2022-02-20 DIAGNOSIS — Z95828 Presence of other vascular implants and grafts: Secondary | ICD-10-CM

## 2022-02-20 MED ORDER — SODIUM CHLORIDE 0.9 % IV SOLN
75.0000 mg/m2 | Freq: Once | INTRAVENOUS | Status: AC
  Administered 2022-02-20: 140 mg via INTRAVENOUS
  Filled 2022-02-20: qty 14

## 2022-02-20 MED ORDER — HEPARIN SOD (PORK) LOCK FLUSH 100 UNIT/ML IV SOLN
500.0000 [IU] | Freq: Once | INTRAVENOUS | Status: AC | PRN
  Administered 2022-02-20: 500 [IU]

## 2022-02-20 MED ORDER — PALONOSETRON HCL INJECTION 0.25 MG/5ML
0.2500 mg | Freq: Once | INTRAVENOUS | Status: AC
  Administered 2022-02-20: 0.25 mg via INTRAVENOUS
  Filled 2022-02-20: qty 5

## 2022-02-20 MED ORDER — SODIUM CHLORIDE 0.9 % IV SOLN
10.0000 mg | Freq: Once | INTRAVENOUS | Status: AC
  Administered 2022-02-20: 10 mg via INTRAVENOUS
  Filled 2022-02-20: qty 10

## 2022-02-20 MED ORDER — SODIUM CHLORIDE 0.9 % IV SOLN: Freq: Once | INTRAVENOUS | Status: AC

## 2022-02-20 MED ORDER — SODIUM CHLORIDE 0.9% FLUSH
10.0000 mL | INTRAVENOUS | Status: DC | PRN
  Administered 2022-02-20: 10 mL

## 2022-02-20 NOTE — Patient Instructions (Signed)
[facility]  Discharge Instructions: Thank you for choosing [facility] to provide your oncology and hematology care.  If you have a lab appointment with the [facility], please come in thru the Main Entrance and check in at the main information desk.  Wear comfortable clothing and clothing appropriate for easy access to any Portacath or PICC line.   We strive to give you quality time with your provider. You may need to reschedule your appointment if you arrive late (15 or more minutes).  Arriving late affects you and other patients whose appointments are after yours.  Also, if you miss three or more appointments without notifying the office, you may be dismissed from the clinic at the provider's discretion.      For prescription refill requests, have your pharmacy contact our office and allow 72 hours for refills to be completed.    Today you received the following chemotherapy and/or immunotherapy agents Vidaza      To help prevent nausea and vomiting after your treatment, we encourage you to take your nausea medication as directed.  BELOW ARE SYMPTOMS THAT SHOULD BE REPORTED IMMEDIATELY: *FEVER GREATER THAN 100.4 F (38 C) OR HIGHER *CHILLS OR SWEATING *NAUSEA AND VOMITING THAT IS NOT CONTROLLED WITH YOUR NAUSEA MEDICATION *UNUSUAL SHORTNESS OF BREATH *UNUSUAL BRUISING OR BLEEDING *URINARY PROBLEMS (pain or burning when urinating, or frequent urination) *BOWEL PROBLEMS (unusual diarrhea, constipation, pain near the anus) TENDERNESS IN MOUTH AND THROAT WITH OR WITHOUT PRESENCE OF ULCERS (sore throat, sores in mouth, or a toothache) UNUSUAL RASH, SWELLING OR PAIN  UNUSUAL VAGINAL DISCHARGE OR ITCHING   Items with * indicate a potential emergency and should be followed up as soon as possible or go to the Emergency Department if any problems should occur.  Please show the CHEMOTHERAPY ALERT CARD or IMMUNOTHERAPY ALERT CARD at check-in to the Emergency  Department and triage nurse.  Should you have questions after your visit or need to cancel or reschedule your appointment, please contact [facility] [phone]  and follow the prompts.  Office hours are 8:00 a.m. to 4:30 p.m. Monday - Friday. Please note that voicemails left after 4:00 p.m. may not be returned until the following business day.  We are closed weekends and major holidays. You have access to a nurse at all times for urgent questions. Please call the main number to the clinic [phone] and follow the prompts.  For any non-urgent questions, you may also contact your provider using MyChart. We now offer e-Visits for anyone 66 and older to request care online for non-urgent symptoms. For details visit [email or web address].   Also download the MyChart app! Go to the app store, search "MyChart", open the app, select [facility], and log in with your MyChart username and password.  Masks are optional in the cancer centers. If you would like for your care team to wear a mask while they are taking care of you, please let them know. You may have one support person who is at least 77 years old accompany you for your appointments.

## 2022-02-20 NOTE — Progress Notes (Signed)
Patient presents today for Vidaza infusion per providers order.  Vital signs within parameters for treatment.  Patient has no new complaints at this time.  Treatment given today per MD orders.  Stable during infusion without adverse affects.  Vital signs stable.  No complaints at this time.  Discharge from clinic ambulatory in stable condition.  Alert and oriented X 3.  Follow up with [facility] as scheduled.

## 2022-02-21 ENCOUNTER — Inpatient Hospital Stay: Payer: Medicare HMO

## 2022-02-21 VITALS — BP 115/76 | HR 66 | Temp 97.9°F | Resp 18

## 2022-02-21 DIAGNOSIS — Z5111 Encounter for antineoplastic chemotherapy: Secondary | ICD-10-CM | POA: Diagnosis not present

## 2022-02-21 DIAGNOSIS — D469 Myelodysplastic syndrome, unspecified: Secondary | ICD-10-CM

## 2022-02-21 DIAGNOSIS — Z95828 Presence of other vascular implants and grafts: Secondary | ICD-10-CM

## 2022-02-21 MED ORDER — SODIUM CHLORIDE 0.9 % IV SOLN
75.0000 mg/m2 | Freq: Once | INTRAVENOUS | Status: AC
  Administered 2022-02-21: 140 mg via INTRAVENOUS
  Filled 2022-02-21: qty 14

## 2022-02-21 MED ORDER — HEPARIN SOD (PORK) LOCK FLUSH 100 UNIT/ML IV SOLN
500.0000 [IU] | Freq: Once | INTRAVENOUS | Status: AC | PRN
  Administered 2022-02-21: 500 [IU]

## 2022-02-21 MED ORDER — SODIUM CHLORIDE 0.9 % IV SOLN: Freq: Once | INTRAVENOUS | Status: AC

## 2022-02-21 MED ORDER — SODIUM CHLORIDE 0.9 % IV SOLN
10.0000 mg | Freq: Once | INTRAVENOUS | Status: AC
  Administered 2022-02-21: 10 mg via INTRAVENOUS
  Filled 2022-02-21: qty 10

## 2022-02-21 MED ORDER — SODIUM CHLORIDE 0.9% FLUSH
10.0000 mL | INTRAVENOUS | Status: DC | PRN
  Administered 2022-02-21: 10 mL

## 2022-02-21 NOTE — Patient Instructions (Signed)
[facility]  Discharge Instructions: Thank you for choosing [facility] to provide your oncology and hematology care.  If you have a lab appointment with the [facility], please come in thru the Main Entrance and check in at the main information desk.  Wear comfortable clothing and clothing appropriate for easy access to any Portacath or PICC line.   We strive to give you quality time with your provider. You may need to reschedule your appointment if you arrive late (15 or more minutes).  Arriving late affects you and other patients whose appointments are after yours.  Also, if you miss three or more appointments without notifying the office, you may be dismissed from the clinic at the provider's discretion.      For prescription refill requests, have your pharmacy contact our office and allow 72 hours for refills to be completed.    Today you received the following chemotherapy and/or immunotherapy agents Vidaza.  Azacitidine Injection What is this medication? AZACITIDINE (ay [place]) treats blood and bone marrow cancers. It works by slowing down the growth of cancer cells. This medicine may be used for other purposes; ask your health care provider or pharmacist if you have questions. COMMON BRAND NAME(S): Vidaza What should I tell my care team before I take this medication? They need to know if you have any of these conditions: Kidney disease Liver disease Low blood cell levels, such as low white cells, platelets, or red blood cells Low levels of albumin in the blood Low levels of bicarbonate in the blood An unusual or allergic reaction to azacitidine, mannitol, other medications, foods, dyes, or preservatives If you or your partner are pregnant or trying to get pregnant Breast-feeding How should I use this medication? This medication is injected into a vein or under the skin. It is given by your care team in a hospital or clinic  setting. Talk to your care team about the use of this medication in children. While it may be prescribed for children as young as 1 month for selected conditions, precautions do apply. Overdosage: If you think you have taken too much of this medicine contact a poison control center or emergency room at once. NOTE: This medicine is only for you. Do not share this medicine with others. What if I miss a dose? Keep appointments for follow-up doses. It is important not to miss your dose. Call your care team if you are unable to keep an appointment. What may interact with this medication? Interactions are not expected. This list may not describe all possible interactions. Give your health care provider a list of all the medicines, herbs, non-prescription drugs, or dietary supplements you use. Also tell them if you smoke, drink alcohol, or use illegal drugs. Some items may interact with your medicine. What should I watch for while using this medication? Your condition will be monitored carefully while you are receiving this medication. You may need blood work while taking this medication. This medication may make you feel generally unwell. This is not uncommon as chemotherapy can affect healthy cells as well as cancer cells. Report any side effects. Continue your course of treatment even though you feel ill unless your care team tells you to stop. Other product types may be available that contain the medication azacitidine. The injection and oral products should not be used in place of one another. Talk to your care team if you have questions. This medication can cause serious side  effects. To reduce the risk, your care team may give you other medications to take before receiving this one. Be sure to follow the directions from your care team. This medication may increase your risk of getting an infection. Call your care team for advice if you get a fever, chills, sore throat, or other symptoms of a cold or  flu. Do not treat yourself. Try to avoid being around people who are sick. Avoid taking medications that contain aspirin, acetaminophen, ibuprofen, naproxen, or ketoprofen unless instructed by your care team. These medications may hide a fever. This medication may increase your risk to bruise or bleed. Call your care team if you notice any unusual bleeding. Be careful brushing or flossing your teeth or using a toothpick because you may get an infection or bleed more easily. If you have any dental work done, tell your dentist you are receiving this medication. Talk to your care team if you or your partner may be pregnant. Serious birth defects can occur if you take this medication during pregnancy and for 6 months after the last dose. You will need a negative pregnancy test before starting this medication. Contraception is recommended while taking his medication and for 6 months after the last dose. Your care team can help you find the option that works for you. If your partner can get pregnant, use a condom during sex while taking this medication and for 3 months after the last dose. Do not breastfeed while taking this medication and for 1 week after the last dose. This medication may cause infertility. Talk to your care team if you are concerned about your fertility. What side effects may I notice from receiving this medication? Side effects that you should report to your care team as soon as possible: Allergic reactions--skin rash, itching, hives, swelling of the face, lips, tongue, or throat Infection--fever, chills, cough, sore throat, wounds that don't heal, pain or trouble when passing urine, general feeling of discomfort or being unwell Kidney injury--decrease in the amount of urine, swelling of the ankles, hands, or feet Liver injury--right upper belly pain, loss of appetite, nausea, light-colored stool, dark yellow or [place] urine, yellowing skin or eyes, unusual weakness or fatigue Low red  blood cell level--unusual weakness or fatigue, dizziness, headache, trouble breathing Tumor lysis syndrome (TLS)--nausea, vomiting, diarrhea, decrease in the amount of urine, dark urine, unusual weakness or fatigue, confusion, muscle pain or cramps, fast or irregular heartbeat, joint pain Unusual bruising or bleeding Side effects that usually do not require medical attention (report to your care team if they continue or are bothersome): Constipation Diarrhea Nausea Pain, redness, or irritation at injection site Vomiting This list may not describe all possible side effects. Call your doctor for medical advice about side effects. You may report side effects to FDA at 1-800-FDA-1088. Where should I keep my medication? This medication is given in a hospital or clinic. It will not be stored at home. NOTE: This sheet is a summary. It may not cover all possible information. If you have questions about this medicine, talk to your doctor, pharmacist, or health care provider.  2023 Elsevier/Gold Standard (2021-07-19 00:00:00)        To help prevent nausea and vomiting after your treatment, we encourage you to take your nausea medication as directed.  BELOW ARE SYMPTOMS THAT SHOULD BE REPORTED IMMEDIATELY: *FEVER GREATER THAN 100.4 F (38 C) OR HIGHER *CHILLS OR SWEATING *NAUSEA AND VOMITING THAT IS NOT CONTROLLED WITH YOUR NAUSEA MEDICATION *UNUSUAL SHORTNESS OF  BREATH *UNUSUAL BRUISING OR BLEEDING *URINARY PROBLEMS (pain or burning when urinating, or frequent urination) *BOWEL PROBLEMS (unusual diarrhea, constipation, pain near the anus) TENDERNESS IN MOUTH AND THROAT WITH OR WITHOUT PRESENCE OF ULCERS (sore throat, sores in mouth, or a toothache) UNUSUAL RASH, SWELLING OR PAIN  UNUSUAL VAGINAL DISCHARGE OR ITCHING   Items with * indicate a potential emergency and should be followed up as soon as possible or go to the Emergency Department if any problems should occur.  Please show the  CHEMOTHERAPY ALERT CARD or IMMUNOTHERAPY ALERT CARD at check-in to the Emergency Department and triage nurse.  Should you have questions after your visit or need to cancel or reschedule your appointment, please contact [facility] [phone]  and follow the prompts.  Office hours are 8:00 a.m. to 4:30 p.m. Monday - Friday. Please note that voicemails left after 4:00 p.m. may not be returned until the following business day.  We are closed weekends and major holidays. You have access to a nurse at all times for urgent questions. Please call the main number to the clinic [phone] and follow the prompts.  For any non-urgent questions, you may also contact your provider using MyChart. We now offer e-Visits for anyone 55 and older to request care online for non-urgent symptoms. For details visit [email or web address].   Also download the MyChart app! Go to the app store, search "MyChart", open the app, select [facility], and log in with your MyChart username and password.  Masks are optional in the cancer centers. If you would like for your care team to wear a mask while they are taking care of you, please let them know. You may have one support person who is at least 77 years old accompany you for your appointments.

## 2022-02-21 NOTE — Progress Notes (Signed)
Patient presents today for Vidaza infusion.  Patient is in satisfactory condition with no complaints voiced.  Vital signs are stable.  We will proceed with treatment per MD orders.   Patient tolerated treatment well with no complaints voiced.  Patient left ambulatory in stable condition.  Vital signs stable at discharge.  Follow up as scheduled.

## 2022-02-22 ENCOUNTER — Inpatient Hospital Stay: Payer: Medicare HMO

## 2022-02-22 VITALS — BP 128/88 | HR 77 | Temp 98.3°F | Resp 18

## 2022-02-22 DIAGNOSIS — Z95828 Presence of other vascular implants and grafts: Secondary | ICD-10-CM

## 2022-02-22 DIAGNOSIS — D469 Myelodysplastic syndrome, unspecified: Secondary | ICD-10-CM

## 2022-02-22 DIAGNOSIS — Z5111 Encounter for antineoplastic chemotherapy: Secondary | ICD-10-CM | POA: Diagnosis not present

## 2022-02-22 MED ORDER — SODIUM CHLORIDE 0.9 % IV SOLN
75.0000 mg/m2 | Freq: Once | INTRAVENOUS | Status: AC
  Administered 2022-02-22: 140 mg via INTRAVENOUS
  Filled 2022-02-22: qty 14

## 2022-02-22 MED ORDER — SODIUM CHLORIDE 0.9% FLUSH
10.0000 mL | INTRAVENOUS | Status: DC | PRN
  Administered 2022-02-22: 10 mL

## 2022-02-22 MED ORDER — SODIUM CHLORIDE 0.9 % IV SOLN
10.0000 mg | Freq: Once | INTRAVENOUS | Status: AC
  Administered 2022-02-22: 10 mg via INTRAVENOUS
  Filled 2022-02-22: qty 10

## 2022-02-22 MED ORDER — PALONOSETRON HCL INJECTION 0.25 MG/5ML
0.2500 mg | Freq: Once | INTRAVENOUS | Status: AC
  Administered 2022-02-22: 0.25 mg via INTRAVENOUS
  Filled 2022-02-22: qty 5

## 2022-02-22 MED ORDER — HEPARIN SOD (PORK) LOCK FLUSH 100 UNIT/ML IV SOLN
500.0000 [IU] | Freq: Once | INTRAVENOUS | Status: AC | PRN
  Administered 2022-02-22: 500 [IU]

## 2022-02-22 MED ORDER — SODIUM CHLORIDE 0.9 % IV SOLN: Freq: Once | INTRAVENOUS | Status: AC

## 2022-02-22 NOTE — Patient Instructions (Signed)
[facility]  Discharge Instructions: Thank you for choosing [facility] to provide your oncology and hematology care.  If you have a lab appointment with the [facility], please come in thru the Main Entrance and check in at the main information desk.  Wear comfortable clothing and clothing appropriate for easy access to any Portacath or PICC line.   We strive to give you quality time with your provider. You may need to reschedule your appointment if you arrive late (15 or more minutes).  Arriving late affects you and other patients whose appointments are after yours.  Also, if you miss three or more appointments without notifying the office, you may be dismissed from the clinic at the provider's discretion.      For prescription refill requests, have your pharmacy contact our office and allow 72 hours for refills to be completed.    Today you received the following chemotherapy and/or immunotherapy agents Vidaza      To help prevent nausea and vomiting after your treatment, we encourage you to take your nausea medication as directed.  BELOW ARE SYMPTOMS THAT SHOULD BE REPORTED IMMEDIATELY: *FEVER GREATER THAN 100.4 F (38 C) OR HIGHER *CHILLS OR SWEATING *NAUSEA AND VOMITING THAT IS NOT CONTROLLED WITH YOUR NAUSEA MEDICATION *UNUSUAL SHORTNESS OF BREATH *UNUSUAL BRUISING OR BLEEDING *URINARY PROBLEMS (pain or burning when urinating, or frequent urination) *BOWEL PROBLEMS (unusual diarrhea, constipation, pain near the anus) TENDERNESS IN MOUTH AND THROAT WITH OR WITHOUT PRESENCE OF ULCERS (sore throat, sores in mouth, or a toothache) UNUSUAL RASH, SWELLING OR PAIN  UNUSUAL VAGINAL DISCHARGE OR ITCHING   Items with * indicate a potential emergency and should be followed up as soon as possible or go to the Emergency Department if any problems should occur.  Please show the CHEMOTHERAPY ALERT CARD or IMMUNOTHERAPY ALERT CARD at check-in to the Emergency  Department and triage nurse.  Should you have questions after your visit or need to cancel or reschedule your appointment, please contact [facility] [phone]  and follow the prompts.  Office hours are 8:00 a.m. to 4:30 p.m. Monday - Friday. Please note that voicemails left after 4:00 p.m. may not be returned until the following business day.  We are closed weekends and major holidays. You have access to a nurse at all times for urgent questions. Please call the main number to the clinic [phone] and follow the prompts.  For any non-urgent questions, you may also contact your provider using MyChart. We now offer e-Visits for anyone 23 and older to request care online for non-urgent symptoms. For details visit [email or web address].   Also download the MyChart app! Go to the app store, search "MyChart", open the app, select [facility], and log in with your MyChart username and password.  Masks are optional in the cancer centers. If you would like for your care team to wear a mask while they are taking care of you, please let them know. You may have one support person who is at least 77 years old accompany you for your appointments.

## 2022-02-22 NOTE — Progress Notes (Signed)
Patient presents today for Vidaza infusion per providers order.  Vital signs within parameters for treatment.  Patient has no new complaints at this time.  Treatment given today per MD orders.  Stable duringinfusion without adverse affects.  Vital signs stable.  No complaints at this time.  Discharge from clinic ambulatory in stable condition.  Alert and oriented X 3.  Follow up with [facility] as scheduled.

## 2022-02-25 ENCOUNTER — Inpatient Hospital Stay: Payer: Medicare HMO

## 2022-02-25 VITALS — BP 108/66 | HR 72 | Temp 99.1°F | Resp 20

## 2022-02-25 DIAGNOSIS — Z5111 Encounter for antineoplastic chemotherapy: Secondary | ICD-10-CM | POA: Diagnosis not present

## 2022-02-25 DIAGNOSIS — D469 Myelodysplastic syndrome, unspecified: Secondary | ICD-10-CM

## 2022-02-25 DIAGNOSIS — Z95828 Presence of other vascular implants and grafts: Secondary | ICD-10-CM

## 2022-02-25 MED ORDER — SODIUM CHLORIDE 0.9 % IV SOLN
75.0000 mg/m2 | Freq: Once | INTRAVENOUS | Status: AC
  Administered 2022-02-25: 140 mg via INTRAVENOUS
  Filled 2022-02-25: qty 14

## 2022-02-25 MED ORDER — SODIUM CHLORIDE 0.9 % IV SOLN: Freq: Once | INTRAVENOUS | Status: AC

## 2022-02-25 MED ORDER — SODIUM CHLORIDE 0.9 % IV SOLN
10.0000 mg | Freq: Once | INTRAVENOUS | Status: AC
  Administered 2022-02-25: 10 mg via INTRAVENOUS
  Filled 2022-02-25: qty 10

## 2022-02-25 MED ORDER — SODIUM CHLORIDE 0.9% FLUSH
10.0000 mL | INTRAVENOUS | Status: DC | PRN
  Administered 2022-02-25: 10 mL

## 2022-02-25 MED ORDER — HEPARIN SOD (PORK) LOCK FLUSH 100 UNIT/ML IV SOLN
500.0000 [IU] | Freq: Once | INTRAVENOUS | Status: AC | PRN
  Administered 2022-02-25: 500 [IU]

## 2022-02-25 MED ORDER — PALONOSETRON HCL INJECTION 0.25 MG/5ML
0.2500 mg | Freq: Once | INTRAVENOUS | Status: AC
  Administered 2022-02-25: 0.25 mg via INTRAVENOUS
  Filled 2022-02-25: qty 5

## 2022-02-25 NOTE — Progress Notes (Signed)
Patient presents today for Vidaz per providers order.  Vital signs within parameters for treatment.  Patients only complaint is of fatigue.  Treatment given today per MD orders.  Stable during infusion without adverse affects.  Vital signs stable.  No complaints at this time.  Discharge from clinic ambulatory in stable condition.  Alert and oriented X 3.  Follow up with [facility] as scheduled.

## 2022-02-25 NOTE — Patient Instructions (Signed)
[facility]  Discharge Instructions: Thank you for choosing [facility] to provide your oncology and hematology care.  If you have a lab appointment with the [facility], please come in thru the Main Entrance and check in at the main information desk.  Wear comfortable clothing and clothing appropriate for easy access to any Portacath or PICC line.   We strive to give you quality time with your provider. You may need to reschedule your appointment if you arrive late (15 or more minutes).  Arriving late affects you and other patients whose appointments are after yours.  Also, if you miss three or more appointments without notifying the office, you may be dismissed from the clinic at the provider's discretion.      For prescription refill requests, have your pharmacy contact our office and allow 72 hours for refills to be completed.    Today you received the following chemotherapy and/or immunotherapy agents Vidaza      To help prevent nausea and vomiting after your treatment, we encourage you to take your nausea medication as directed.  BELOW ARE SYMPTOMS THAT SHOULD BE REPORTED IMMEDIATELY: *FEVER GREATER THAN 100.4 F (38 C) OR HIGHER *CHILLS OR SWEATING *NAUSEA AND VOMITING THAT IS NOT CONTROLLED WITH YOUR NAUSEA MEDICATION *UNUSUAL SHORTNESS OF BREATH *UNUSUAL BRUISING OR BLEEDING *URINARY PROBLEMS (pain or burning when urinating, or frequent urination) *BOWEL PROBLEMS (unusual diarrhea, constipation, pain near the anus) TENDERNESS IN MOUTH AND THROAT WITH OR WITHOUT PRESENCE OF ULCERS (sore throat, sores in mouth, or a toothache) UNUSUAL RASH, SWELLING OR PAIN  UNUSUAL VAGINAL DISCHARGE OR ITCHING   Items with * indicate a potential emergency and should be followed up as soon as possible or go to the Emergency Department if any problems should occur.  Please show the CHEMOTHERAPY ALERT CARD or IMMUNOTHERAPY ALERT CARD at check-in to the Emergency  Department and triage nurse.  Should you have questions after your visit or need to cancel or reschedule your appointment, please contact [facility] [phone]  and follow the prompts.  Office hours are 8:00 a.m. to 4:30 p.m. Monday - Friday. Please note that voicemails left after 4:00 p.m. may not be returned until the following business day.  We are closed weekends and major holidays. You have access to a nurse at all times for urgent questions. Please call the main number to the clinic [phone] and follow the prompts.  For any non-urgent questions, you may also contact your provider using MyChart. We now offer e-Visits for anyone 1 and older to request care online for non-urgent symptoms. For details visit [email or web address].   Also download the MyChart app! Go to the app store, search "MyChart", open the app, select [facility], and log in with your MyChart username and password.  Masks are optional in the cancer centers. If you would like for your care team to wear a mask while they are taking care of you, please let them know. You may have one support person who is at least 77 years old accompany you for your appointments.

## 2022-02-26 ENCOUNTER — Inpatient Hospital Stay: Payer: Medicare HMO

## 2022-02-26 VITALS — BP 115/81 | HR 86 | Temp 97.6°F | Resp 18

## 2022-02-26 DIAGNOSIS — Z5111 Encounter for antineoplastic chemotherapy: Secondary | ICD-10-CM | POA: Diagnosis not present

## 2022-02-26 DIAGNOSIS — Z95828 Presence of other vascular implants and grafts: Secondary | ICD-10-CM

## 2022-02-26 DIAGNOSIS — D469 Myelodysplastic syndrome, unspecified: Secondary | ICD-10-CM

## 2022-02-26 MED ORDER — SODIUM CHLORIDE 0.9 % IV SOLN: Freq: Once | INTRAVENOUS | Status: AC

## 2022-02-26 MED ORDER — SODIUM CHLORIDE 0.9 % IV SOLN
10.0000 mg | Freq: Once | INTRAVENOUS | Status: AC
  Administered 2022-02-26: 10 mg via INTRAVENOUS
  Filled 2022-02-26: qty 10

## 2022-02-26 MED ORDER — SODIUM CHLORIDE 0.9 % IV SOLN
75.0000 mg/m2 | Freq: Once | INTRAVENOUS | Status: AC
  Administered 2022-02-26: 140 mg via INTRAVENOUS
  Filled 2022-02-26: qty 14

## 2022-02-26 MED ORDER — HEPARIN SOD (PORK) LOCK FLUSH 100 UNIT/ML IV SOLN
500.0000 [IU] | Freq: Once | INTRAVENOUS | Status: AC | PRN
  Administered 2022-02-26: 500 [IU]

## 2022-02-26 MED ORDER — SODIUM CHLORIDE 0.9% FLUSH
10.0000 mL | INTRAVENOUS | Status: DC | PRN
  Administered 2022-02-26: 10 mL

## 2022-02-26 NOTE — Patient Instructions (Signed)
[facility]  Discharge Instructions: Thank you for choosing [facility] to provide your oncology and hematology care.  If you have a lab appointment with the [facility], please come in thru the Main Entrance and check in at the main information desk.  Wear comfortable clothing and clothing appropriate for easy access to any Portacath or PICC line.   We strive to give you quality time with your provider. You may need to reschedule your appointment if you arrive late (15 or more minutes).  Arriving late affects you and other patients whose appointments are after yours.  Also, if you miss three or more appointments without notifying the office, you may be dismissed from the clinic at the provider's discretion.      For prescription refill requests, have your pharmacy contact our office and allow 72 hours for refills to be completed.    Today you received the following chemotherapy and/or immunotherapy agents D7 Vidaza    To help prevent nausea and vomiting after your treatment, we encourage you to take your nausea medication as directed.   Azacitidine Injection What is this medication? AZACITIDINE (ay Tensas) treats blood and bone marrow cancers. It works by slowing down the growth of cancer cells. This medicine may be used for other purposes; ask your health care provider or pharmacist if you have questions. COMMON BRAND NAME(S): Vidaza What should I tell my care team before I take this medication? They need to know if you have any of these conditions: Kidney disease Liver disease Low blood cell levels, such as low white cells, platelets, or red blood cells Low levels of albumin in the blood Low levels of bicarbonate in the blood An unusual or allergic reaction to azacitidine, mannitol, other medications, foods, dyes, or preservatives If you or your partner are pregnant or trying to get pregnant Breast-feeding How should I use this  medication? This medication is injected into a vein or under the skin. It is given by your care team in a hospital or clinic setting. Talk to your care team about the use of this medication in children. While it may be prescribed for children as young as 1 month for selected conditions, precautions do apply. Overdosage: If you think you have taken too much of this medicine contact a poison control center or emergency room at once. NOTE: This medicine is only for you. Do not share this medicine with others. What if I miss a dose? Keep appointments for follow-up doses. It is important not to miss your dose. Call your care team if you are unable to keep an appointment. What may interact with this medication? Interactions are not expected. This list may not describe all possible interactions. Give your health care provider a list of all the medicines, herbs, non-prescription drugs, or dietary supplements you use. Also tell them if you smoke, drink alcohol, or use illegal drugs. Some items may interact with your medicine. What should I watch for while using this medication? Your condition will be monitored carefully while you are receiving this medication. You may need blood work while taking this medication. This medication may make you feel generally unwell. This is not uncommon as chemotherapy can affect healthy cells as well as cancer cells. Report any side effects. Continue your course of treatment even though you feel ill unless your care team tells you to stop. Other product types may be available that contain the medication azacitidine. The injection and oral products  should not be used in place of one another. Talk to your care team if you have questions. This medication can cause serious side effects. To reduce the risk, your care team may give you other medications to take before receiving this one. Be sure to follow the directions from your care team. This medication may increase your risk of  getting an infection. Call your care team for advice if you get a fever, chills, sore throat, or other symptoms of a cold or flu. Do not treat yourself. Try to avoid being around people who are sick. Avoid taking medications that contain aspirin, acetaminophen, ibuprofen, naproxen, or ketoprofen unless instructed by your care team. These medications may hide a fever. This medication may increase your risk to bruise or bleed. Call your care team if you notice any unusual bleeding. Be careful brushing or flossing your teeth or using a toothpick because you may get an infection or bleed more easily. If you have any dental work done, tell your dentist you are receiving this medication. Talk to your care team if you or your partner may be pregnant. Serious birth defects can occur if you take this medication during pregnancy and for 6 months after the last dose. You will need a negative pregnancy test before starting this medication. Contraception is recommended while taking his medication and for 6 months after the last dose. Your care team can help you find the option that works for you. If your partner can get pregnant, use a condom during sex while taking this medication and for 3 months after the last dose. Do not breastfeed while taking this medication and for 1 week after the last dose. This medication may cause infertility. Talk to your care team if you are concerned about your fertility. What side effects may I notice from receiving this medication? Side effects that you should report to your care team as soon as possible: Allergic reactions--skin rash, itching, hives, swelling of the face, lips, tongue, or throat Infection--fever, chills, cough, sore throat, wounds that don't heal, pain or trouble when passing urine, general feeling of discomfort or being unwell Kidney injury--decrease in the amount of urine, swelling of the ankles, hands, or feet Liver injury--right upper belly pain, loss of  appetite, nausea, light-colored stool, dark yellow or brown urine, yellowing skin or eyes, unusual weakness or fatigue Low red blood cell level--unusual weakness or fatigue, dizziness, headache, trouble breathing Tumor lysis syndrome (TLS)--nausea, vomiting, diarrhea, decrease in the amount of urine, dark urine, unusual weakness or fatigue, confusion, muscle pain or cramps, fast or irregular heartbeat, joint pain Unusual bruising or bleeding Side effects that usually do not require medical attention (report to your care team if they continue or are bothersome): Constipation Diarrhea Nausea Pain, redness, or irritation at injection site Vomiting This list may not describe all possible side effects. Call your doctor for medical advice about side effects. You may report side effects to FDA at 1-800-FDA-1088. Where should I keep my medication? This medication is given in a hospital or clinic. It will not be stored at home. NOTE: This sheet is a summary. It may not cover all possible information. If you have questions about this medicine, talk to your doctor, pharmacist, or health care provider.  2023 Elsevier/Gold Standard (2021-07-19 00:00:00)  BELOW ARE SYMPTOMS THAT SHOULD BE REPORTED IMMEDIATELY: *FEVER GREATER THAN 100.4 F (38 C) OR HIGHER *CHILLS OR SWEATING *NAUSEA AND VOMITING THAT IS NOT CONTROLLED WITH YOUR NAUSEA MEDICATION *UNUSUAL SHORTNESS OF BREATH *UNUSUAL  BRUISING OR BLEEDING *URINARY PROBLEMS (pain or burning when urinating, or frequent urination) *BOWEL PROBLEMS (unusual diarrhea, constipation, pain near the anus) TENDERNESS IN MOUTH AND THROAT WITH OR WITHOUT PRESENCE OF ULCERS (sore throat, sores in mouth, or a toothache) UNUSUAL RASH, SWELLING OR PAIN  UNUSUAL VAGINAL DISCHARGE OR ITCHING   Items with * indicate a potential emergency and should be followed up as soon as possible or go to the Emergency Department if any problems should occur.  Please show the  CHEMOTHERAPY ALERT CARD or IMMUNOTHERAPY ALERT CARD at check-in to the Emergency Department and triage nurse.  Should you have questions after your visit or need to cancel or reschedule your appointment, please contact [facility] [phone]  and follow the prompts.  Office hours are 8:00 a.m. to 4:30 p.m. Monday - Friday. Please note that voicemails left after 4:00 p.m. may not be returned until the following business day.  We are closed weekends and major holidays. You have access to a nurse at all times for urgent questions. Please call the main number to the clinic [phone] and follow the prompts.  For any non-urgent questions, you may also contact your provider using MyChart. We now offer e-Visits for anyone 39 and older to request care online for non-urgent symptoms. For details visit [email or web address].   Also download the MyChart app! Go to the app store, search "MyChart", open the app, select [facility], and log in with your MyChart username and password.  Masks are optional in the cancer centers. If you would like for your care team to wear a mask while they are taking care of you, please let them know. You may have one support person who is at least 77 years old accompany you for your appointments.

## 2022-02-26 NOTE — Progress Notes (Signed)
Pt presents today for D7 Vidaza per provider's order. Vital signs stable and pt voiced no new complaints at this time.  D7 Vidaza given today per MD orders. Tolerated infusion without adverse affects. Vital signs stable. No complaints at this time. Discharged from clinic ambulatory in stable condition. Alert and oriented x 3. F/U with [facility] as scheduled.

## 2022-02-28 ENCOUNTER — Other Ambulatory Visit: Payer: Self-pay

## 2022-02-28 ENCOUNTER — Other Ambulatory Visit (HOSPITAL_COMMUNITY): Payer: Self-pay

## 2022-03-04 ENCOUNTER — Inpatient Hospital Stay: Payer: Medicare HMO

## 2022-03-04 ENCOUNTER — Inpatient Hospital Stay (HOSPITAL_BASED_OUTPATIENT_CLINIC_OR_DEPARTMENT_OTHER): Payer: Medicare HMO | Admitting: Hematology

## 2022-03-04 ENCOUNTER — Other Ambulatory Visit (HOSPITAL_COMMUNITY): Payer: Self-pay

## 2022-03-04 VITALS — BP 130/77 | HR 85 | Temp 99.6°F | Resp 18 | Ht 70.0 in | Wt 150.5 lb

## 2022-03-04 DIAGNOSIS — D469 Myelodysplastic syndrome, unspecified: Secondary | ICD-10-CM | POA: Diagnosis not present

## 2022-03-04 DIAGNOSIS — Z95828 Presence of other vascular implants and grafts: Secondary | ICD-10-CM

## 2022-03-04 DIAGNOSIS — Z5111 Encounter for antineoplastic chemotherapy: Secondary | ICD-10-CM | POA: Diagnosis not present

## 2022-03-04 LAB — COMPREHENSIVE METABOLIC PANEL
ALT: 15 U/L (ref 0–44)
AST: 17 U/L (ref 15–41)
Albumin: 3.2 g/dL — ABNORMAL LOW (ref 3.5–5.0)
Alkaline Phosphatase: 69 U/L (ref 38–126)
Anion gap: 10 (ref 5–15)
BUN: 10 mg/dL (ref 8–23)
CO2: 23 mmol/L (ref 22–32)
Calcium: 8.7 mg/dL — ABNORMAL LOW (ref 8.9–10.3)
Chloride: 104 mmol/L (ref 98–111)
Creatinine, Ser: 0.95 mg/dL (ref 0.61–1.24)
GFR, Estimated: >60 mL/min (ref >60)
Glucose, Bld: 131 mg/dL — ABNORMAL HIGH (ref 70–99)
Potassium: 3.7 mmol/L (ref 3.5–5.1)
Sodium: 137 mmol/L (ref 135–145)
Total Bilirubin: 1.7 mg/dL — ABNORMAL HIGH (ref 0.3–1.2)
Total Protein: 6 g/dL — ABNORMAL LOW (ref 6.5–8.1)

## 2022-03-04 LAB — CBC WITH DIFFERENTIAL/PLATELET
Abs Immature Granulocytes: 0 10*3/uL (ref 0.00–0.07)
Basophils Absolute: 0 10*3/uL (ref 0.0–0.1)
Basophils Relative: 0 %
Eosinophils Absolute: 0 10*3/uL (ref 0.0–0.5)
Eosinophils Relative: 3 %
HCT: 31.3 % — ABNORMAL LOW (ref 39.0–52.0)
Hemoglobin: 10.6 g/dL — ABNORMAL LOW (ref 13.0–17.0)
Immature Granulocytes: 0 %
Lymphocytes Relative: 47 %
Lymphs Abs: 0.4 10*3/uL — ABNORMAL LOW (ref 0.7–4.0)
MCH: 31.2 pg (ref 26.0–34.0)
MCHC: 33.9 g/dL (ref 30.0–36.0)
MCV: 92.1 fL (ref 80.0–100.0)
Monocytes Absolute: 0 10*3/uL — ABNORMAL LOW (ref 0.1–1.0)
Monocytes Relative: 4 %
Neutro Abs: 0.4 10*3/uL — CL (ref 1.7–7.7)
Neutrophils Relative %: 46 %
Platelets: 92 10*3/uL — ABNORMAL LOW (ref 150–400)
RBC: 3.4 MIL/uL — ABNORMAL LOW (ref 4.22–5.81)
RDW: 16.9 % — ABNORMAL HIGH (ref 11.5–15.5)
WBC: 0.8 10*3/uL — CL (ref 4.0–10.5)
nRBC: 0 % (ref 0.0–0.2)

## 2022-03-04 LAB — URIC ACID: Uric Acid, Serum: 5.4 mg/dL (ref 3.7–8.6)

## 2022-03-04 LAB — MAGNESIUM: Magnesium: 2.1 mg/dL (ref 1.7–2.4)

## 2022-03-04 MED ORDER — VENETOCLAX 100 MG PO TABS
200.0000 mg | ORAL_TABLET | Freq: Every day | ORAL | 0 refills | Status: DC
  Filled 2022-03-04 – 2022-03-06 (×2): qty 28, 14d supply, fill #0
  Filled 2022-03-20: qty 28, 28d supply, fill #0

## 2022-03-04 MED ORDER — SODIUM CHLORIDE 0.9% FLUSH
10.0000 mL | Freq: Once | INTRAVENOUS | Status: AC
  Administered 2022-03-04: 10 mL via INTRAVENOUS

## 2022-03-04 MED ORDER — LACTULOSE 20 GM/30ML PO SOLN: 30.0000 mL | Freq: Every day | ORAL | 3 refills | Status: AC

## 2022-03-04 MED ORDER — HEPARIN SOD (PORK) LOCK FLUSH 100 UNIT/ML IV SOLN
500.0000 [IU] | Freq: Once | INTRAVENOUS | Status: AC
  Administered 2022-03-04: 500 [IU] via INTRAVENOUS

## 2022-03-04 NOTE — Progress Notes (Signed)
CRITICAL VALUE ALERT Critical value received:  WBC 0.8, ANC 0.4 Date of notification:  03-04-22 Time of notification: 0981 Critical value read back:  Yes.   Nurse who received alert:  Ronnald Nian RN MD notified time and response:  1138, Dr. Delton Coombes , no new orders.

## 2022-03-04 NOTE — Progress Notes (Signed)
Patient is taking venetoclax as prescribed.  He has not missed any doses and reports no side effects at this time.    Patient has been examined by Dr. Delton Coombes, and vital signs and labs have been reviewed. ANC, Creatinine, LFTs, hemoglobin, and platelets are within treatment parameters per M.D. - pt may proceed with treatment.  Primary RN and pharmacy notified.

## 2022-03-04 NOTE — Progress Notes (Signed)
No fluids today per providers order.  Vital signs stable.  No complaints at this time.  Discharge from clinic ambulatory in stable condition.  Alert and oriented X 3.  Follow up with [facility] as scheduled.

## 2022-03-04 NOTE — Patient Instructions (Signed)
[facility]  Discharge Instructions: Thank you for choosing [facility] to provide your oncology and hematology care.  If you have a lab appointment with the [facility], please come in thru the Main Entrance and check in at the main information desk.  Wear comfortable clothing and clothing appropriate for easy access to any Portacath or PICC line.   We strive to give you quality time with your provider. You may need to reschedule your appointment if you arrive late (15 or more minutes).  Arriving late affects you and other patients whose appointments are after yours.  Also, if you miss three or more appointments without notifying the office, you may be dismissed from the clinic at the provider's discretion.      For prescription refill requests, have your pharmacy contact our office and allow 72 hours for refills to be completed.    Today you received the following chemotherapy and/or immunotherapy agents  nothing      To help prevent nausea and vomiting after your treatment, we encourage you to take your nausea medication as directed.  BELOW ARE SYMPTOMS THAT SHOULD BE REPORTED IMMEDIATELY: *FEVER GREATER THAN 100.4 F (38 C) OR HIGHER *CHILLS OR SWEATING *NAUSEA AND VOMITING THAT IS NOT CONTROLLED WITH YOUR NAUSEA MEDICATION *UNUSUAL SHORTNESS OF BREATH *UNUSUAL BRUISING OR BLEEDING *URINARY PROBLEMS (pain or burning when urinating, or frequent urination) *BOWEL PROBLEMS (unusual diarrhea, constipation, pain near the anus) TENDERNESS IN MOUTH AND THROAT WITH OR WITHOUT PRESENCE OF ULCERS (sore throat, sores in mouth, or a toothache) UNUSUAL RASH, SWELLING OR PAIN  UNUSUAL VAGINAL DISCHARGE OR ITCHING   Items with * indicate a potential emergency and should be followed up as soon as possible or go to the Emergency Department if any problems should occur.  Please show the CHEMOTHERAPY ALERT CARD or IMMUNOTHERAPY ALERT CARD at check-in to the  Emergency Department and triage nurse.  Should you have questions after your visit or need to cancel or reschedule your appointment, please contact [facility] [phone]  and follow the prompts.  Office hours are 8:00 a.m. to 4:30 p.m. Monday - Friday. Please note that voicemails left after 4:00 p.m. may not be returned until the following business day.  We are closed weekends and major holidays. You have access to a nurse at all times for urgent questions. Please call the main number to the clinic [phone] and follow the prompts.  For any non-urgent questions, you may also contact your provider using MyChart. We now offer e-Visits for anyone 73 and older to request care online for non-urgent symptoms. For details visit [email or web address].   Also download the MyChart app! Go to the app store, search "MyChart", open the app, select [facility], and log in with your MyChart username and password.  Masks are optional in the cancer centers. If you would like for your care team to wear a mask while they are taking care of you, please let them know. You may have one support person who is at least 77 years old accompany you for your appointments.

## 2022-03-04 NOTE — Progress Notes (Signed)
Chad Lamb, [place] [address]   CLINIC:  Medical Oncology/Hematology  PCP:  [name], [place] / [place] [address] [phone]   REASON FOR VISIT:  Follow-up for MDS with EB 2  PRIOR THERAPY: none  NGS Results: ASXL 1 mutation  CURRENT THERAPY: Azacitidine IV D1-7 q28d (started 06/18/21)  BRIEF ONCOLOGIC HISTORY:  Oncology History  Myelodysplastic syndrome ([place])  06/17/2021 Initial Diagnosis   Myelodysplastic syndrome ([place])   07/30/2021 - 10/30/2021 Chemotherapy   Patient is on Treatment Plan : MYELODYSPLASIA  Azacitidine IV D1-7 q28d     07/30/2021 -  Chemotherapy   Patient is on Treatment Plan : MYELODYSPLASIA  Azacitidine IV D1-7 q28d       CANCER STAGING:  Cancer Staging  No matching staging information was found for the patient.  INTERVAL HISTORY:  [name], a 76 y.o. male, seen for follow-up of MDS.  He has completed venetoclax 200 mg daily on 03/03/2022 which she took for 2 weeks.  Saturday and Sunday he felt really bad and was in bed most part of the day.  Monday his energy levels improved to normal.  He is taking Colace, stimulant and MiraLAX for constipation.  REVIEW OF SYSTEMS:  Review of Systems  Constitutional:  Negative for appetite change and fatigue.  HENT:   Negative for nosebleeds and trouble swallowing.   Respiratory:  Positive for shortness of breath (Only on exertion while swimming). Negative for hemoptysis.   Gastrointestinal:  Positive for constipation. Negative for blood in stool and nausea.  Genitourinary:  Negative for hematuria.   Hematological:  Does not bruise/bleed easily.  Psychiatric/Behavioral:  Positive for depression. The patient is nervous/anxious.   All other systems reviewed and are negative.   PAST MEDICAL/SURGICAL HISTORY:  Past Medical History:  Diagnosis Date   Allergy    Anxiety    Arthritis    Asthma    Cataract    Clotting disorder ([place])    DVT  (deep venous thrombosis) (HCC)    unknown etiology. 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[place]) 06/17/2021   Pneumonia    Port-A-Cath in place 07/30/2021   Past Surgical History:  Procedure Laterality Date   CATARACT EXTRACTION W/PHACO Right 11/24/2017   Procedure: CATARACT EXTRACTION PHACO AND INTRAOCULAR LENS PLACEMENT RIGHT EYE;  Surgeon: [name], MD;  Location: [facility];  Service: Ophthalmology;  Laterality: Right;  CDE: 8.61   CATARACT EXTRACTION W/PHACO Left 12/29/2017   Procedure: CATARACT EXTRACTION PHACO AND INTRAOCULAR LENS PLACEMENT (IOC);  Surgeon: [name], MD;  Location: [facility];  Service: Ophthalmology;  Laterality: Left;  CDE: 7.21   GANGLION CYST EXCISION     HERNIA REPAIR     bilateral   PARS PLANA VITRECTOMY Left 05/17/2021   Procedure: PARS PLANA VITRECTOMY 25 GAUGE WITH ANTIBIOTIC INJECTION  AND ENDOLASER LEFT EYE;  Surgeon: [name], MD;  Location: [facility];  Service: Ophthalmology;  Laterality: Left;   PORTACATH PLACEMENT Right 07/09/2021   Procedure: INSERTION PORT-A-CATH;  Surgeon: Aviva Signs, MD;  Location: [facility];  Service: General;  Laterality: Right;   TONSILLECTOMY     VASCULAR SURGERY      SOCIAL HISTORY:  Social History   Socioeconomic History   Marital status: Married    Spouse name: [name]  Number of children: 0   Years of education: 13   Highest education level: High school graduate  Occupational History   Not on file  Tobacco Use   Smoking status: Former    Types: Pipe    Quit date: 03/21/1966    Years since quitting: 55.9   Smokeless tobacco: Never  Vaping Use   Vaping Use: Never used  Substance and Sexual Activity   Alcohol use: Yes    Alcohol/week: 1.0 standard drink of alcohol    Types: 1 Cans of beer per week    Comment: 1 can of beer a week   Drug use: Never   Sexual activity: Not  Currently  Other Topics Concern   Not on file  Social History Narrative   ** Merged History Encounter **       Grew up in [place], [place] and [place].  Retired.  Worked in [place]. 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    Social Determinants of Health   Financial Resource Strain: Low Risk  (01/23/2017)   Overall Financial Resource Strain (CARDIA)    Difficulty of Paying Living Expenses: Not hard at all  Food Insecurity: No Food Insecurity (01/23/2017)   Hunger Vital Sign    Worried About Running Out of Food in the Last Year: Never true    Ran Out of Food in the Last Year: Never true  Transportation Needs: No Transportation Needs (01/23/2017)   PRAPARE - Hydrologist (Medical): No    Lack of Transportation (Non-Medical): No  Physical Activity: Unknown (01/23/2017)   Exercise Vital Sign    Days of Exercise per Week: 3 days    Minutes of Exercise per Session: Not on file  Stress: Stress Concern Present (01/23/2017)   [place]    Feeling of Stress : To some extent  Social Connections: Somewhat Isolated (01/23/2017)   Social Connection and Isolation Panel [NHANES]    Frequency of Communication with Friends and Family: More than three times a week    Frequency of Social Gatherings with Friends and Family: More than three times a week    Attends Religious Services: Never    Marine scientist or Organizations: No    Attends Archivist Meetings: Never    Marital Status: Married  Human resources officer Violence: Not At Risk (01/23/2017)   Humiliation, Afraid, Rape, and Kick questionnaire    Fear of Current or Ex-Partner: No    Emotionally Abused: No    Physically Abused: No    Sexually Abused: No    FAMILY HISTORY:  Family History  Problem Relation Age of Onset   Cancer Mother        pancreatic   Arthritis Father    Early death  Father 58       Lung infiltrate   Colon cancer Neg Hx    Colon polyps Neg Hx     CURRENT MEDICATIONS:  Current Outpatient Medications  Medication Sig Dispense Refill   albuterol (PROVENTIL HFA;VENTOLIN HFA) 108 (90 Base) MCG/ACT inhaler Inhale 1-2 puffs into the lungs every 6 (six) hours as needed for wheezing or shortness of breath.     ALPRAZolam (XANAX) 0.5 MG tablet Take 0.5 mg by mouth 2 (two) times daily as needed for anxiety.     amLODipine (NORVASC) 2.5 MG tablet Take 2.5 mg by mouth daily.     azaCITIDine 5 mg/2  mLs in lactated ringers infusion Inject into the vein daily. Days 1-7 every 28 days     fluticasone furoate-vilanterol (BREO ELLIPTA) 200-25 MCG/ACT AEPB Inhale 1 puff into the lungs daily.     Lactulose 20 GM/30ML SOLN Take 30 mLs (20 g total) by mouth at bedtime. 473 mL 3   Lidocaine-Hydrocort, Perianal, 3-0.5 % CREA Apply topically 2 (two) times daily as needed.     Lidocaine-Hydrocortisone Ace 3-0.5 % CREA Apply 1 application topically as directed. (Patient taking differently: Apply 1 application  topically 2 (two) times daily as needed (hemorrhoids).) 30 Tube 11   lidocaine-prilocaine (EMLA) cream Apply topically as needed. Apply to port site prior to infusion as needed 30 g 60   loratadine (CLARITIN) 10 MG tablet Take 10 mg by mouth daily.     Lutein 40 MG CAPS Take 40 mg by mouth daily.     Multiple Vitamins-Minerals (CENTRUM ADULTS PO) Take 1 tablet by mouth daily.     nystatin cream (MYCOSTATIN) Apply 1 application. topically 2 (two) times daily as needed for dry skin.     ofloxacin (OCUFLOX) 0.3 % ophthalmic solution Place 1 drop into the left eye See admin instructions. 1 drop to left eye 4 x daily for 7 days after monthly procedure.     omeprazole (PRILOSEC OTC) 20 MG tablet Take 5-10 mg by mouth daily.     sildenafil (VIAGRA) 50 MG tablet Take 50 mg by mouth daily as needed for erectile dysfunction.     traMADol (ULTRAM) 50 MG tablet Take 1 tablet (50 mg total)  by mouth every 6 (six) hours as needed. 15 tablet 0   venetoclax (VENCLEXTA) 100 MG tablet Take 2 tablets (200 mg total) by mouth daily. Take for 14 days, then hold for 14 days. Repeat every 28 days. Take with a meal and a full glass of water. 28 tablet 0   No current facility-administered medications for this visit.    ALLERGIES:  Allergies  Allergen Reactions   Cephalosporins Itching   Amoxapine And Related Itching and Rash    Has patient had a PCN reaction causing immediate rash, facial/tongue/throat swelling, SOB or lightheadedness with hypotension: No Has patient had a PCN reaction causing severe rash involving mucus membranes or skin necrosis: No Has patient had a PCN reaction that required hospitalization: No Has patient had a PCN reaction occurring within the last 10 years: No If all of the above answers are "NO", then may proceed with Cephalosporin use.     Amoxicillin Rash   Cephalexin Rash   Clindamycin/Lincomycin Itching and Rash   Sulfamethoxazole Itching and Rash   Trimethoprim Rash    PHYSICAL EXAM:  Performance status (ECOG): 1 - Symptomatic but completely ambulatory  There were no vitals filed for this visit. Wt Readings from Last 3 Encounters:  03/04/22 150 lb 8 oz (68.3 kg)  02/18/22 152 lb 6.4 oz (69.1 kg)  01/28/22 156 lb 6.4 oz (70.9 kg)   Physical Exam Vitals reviewed.  Constitutional:      Appearance: Normal appearance.  Cardiovascular:     Heart sounds: Normal heart sounds.  Pulmonary:     Effort: Pulmonary effort is normal.     Breath sounds: Normal breath sounds.  Neurological:     Mental Status: He is alert and oriented to person, place, and time.  Psychiatric:        Mood and Affect: Mood normal.        Behavior: Behavior normal.  LABORATORY DATA:  I have reviewed the labs as listed.     Latest Ref Rng & Units 03/04/2022    9:42 AM 02/18/2022   11:26 AM 01/21/2022   10:30 AM  CBC  WBC 4.0 - 10.5 K/uL 0.8  1.4  2.1   Hemoglobin  13.0 - 17.0 g/dL 10.6  10.9  11.1   Hematocrit 39.0 - 52.0 % 31.3  32.8  32.7   Platelets 150 - 400 K/uL 92  159  147       Latest Ref Rng & Units 03/04/2022    9:42 AM 02/18/2022   11:26 AM 01/21/2022   10:30 AM  CMP  Glucose 70 - 99 mg/dL 131  110  99   BUN 8 - 23 mg/dL _0 Creatinine 0.61 - 1.24 mg/dL 0.95  0.90  0.82   Sodium 135 - 145 mmol/L 137  136  138   Potassium 3.5 - 5.1 mmol/L 3.7  3.7  3.8   Chloride 98 - 111 mmol/L 104  104  105   CO2 22 - 32 mmol/L _1 Calcium 8.9 - 10.3 mg/dL 8.7  9.1  9.2   Total Protein 6.5 - 8.1 g/dL 6.0  6.5  6.3   Total Bilirubin 0.3 - 1.2 mg/dL 1.7  1.6  1.1   Alkaline Phos 38 - 126 U/L 69  78  80   AST 15 - 41 U/L _2 ALT 0 - 44 U/L _3 DIAGNOSTIC IMAGING:  I have independently reviewed the scans and discussed with the patient. No results found.   ASSESSMENT:  MDS with EB 2: - Seen at the request of Dr. Mannie Stabile for abnormal CBC. - Labs on 12/06/2020 shows white count 3.0 with 41% neutrophils, 41% lymphocytes, 13.8% monocytes.  ANC was 1.2.  Hemoglobin 13.4 and normal.  MCV was slightly high at 96.  Platelet count was 128.  Vitamin X91 and folic acid were normal. - He took antibiotics for teeth implants few times this year, last 1 on 11/16/2020.  He does not know the antibiotic name.  He also reports taking over-the-counter pill for memory for 5 to 6 months, which was stopped around September 2022. - Labs from 07/07/2020 with creatinine 0.92.  White count 3.3 (37% neutrophils, 45% lymphocytes, 14% monocytes, 3% eosinophils), ANC 1.2.  Platelet count 149. - His prior CBC from 12/08/2017 in our system was completely normal. - Reports slight worsening of the night sweats in the last 3 months.  At least once per week and has to change clothes.  No fevers or weight loss. - BMBX on 06/04/2021: Hypercellular marrow with dyspoietic changes involving the granulocytic cell line and megakaryocytes.  Myeloblasts 8% on  aspirate smears and 10% by flow.  Cytogenetics 42, XY (20).  MDS FISH panel normal. 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 - IPSS-M score of 0.12, moderate high risk.  Leukemia free survival is 2.3 years.  Overall survival 2.8 years. - Cycle 1 of azacitidine on 07/30/2021.  BMBX (01/04/2022): Overall improvement in blast count and cellularity.  Chromosome analysis and FISH panel normal.  Venetoclax 200 mg day 1 through 14 added with cycle 8 on 02/18/2022.   Social/family history: - He lives at home with his wife.  He swims about 40 minutes daily. 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  Non-smoker. - Paternal aunt had breast cancer and mother had pancreatic cancer.  3.  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 - He underwent thrombolytic therapy at [facility]. - He will followed up with vascular at [facility] and has been on Xarelto since then.   PLAN:  MDS with EB 2: - Venetoclax 200 mg days 1 through 14 was added with cycle 8 on 02/18/2022. - He felt severely weak on Saturday and Sunday and stayed mostly in the bed.  Monday he felt better and today his energy levels are back to his baseline of 40 to 50%. - Reviewed labs today which showed normal LFTs and creatinine.  CBC shows white count is 0.8 and platelet count 92,000.  Hemoglobin is 10.6. - He finished last venetoclax on 03/03/2022.  No indication for transfusion. - RTC 2 weeks for follow-up for next cycle.  Will send in the prescription for venetoclax.   Unprovoked left subclavian DVT: - Xarelto discontinued due to hemorrhoidal bleeding.  3.  Constipation: - This is partly from Aloxi. - She is currently taking Colace 3 to 4 tablets/day and stimulant once daily.  He takes MiraLAX at bedtime.  This is not controlling it. - Will add lactulose once daily at bedtime and titrate up as needed.   Orders placed this encounter:  No  orders of the defined types were placed in this encounter.     [name], MD [place] [phone]

## 2022-03-04 NOTE — Patient Instructions (Addendum)
[place] at [facility] Discharge Instructions   You were seen and examined today by Dr. Delton Coombes.  He reviewed the results of your lab work which are normal/stable. You white count is low, which is coming from taking from the venetoclax. This will improve in the next couple of weeks.   We will proceed with your treatment today.   Return as scheduled.    Thank you for choosing [place] at [facility] to provide your oncology and hematology care.  To afford each patient quality time with our provider, please arrive at least 15 minutes before your scheduled appointment time.   If you have a lab appointment with the [facility] please come in thru the Main Entrance and check in at the main information desk.  You need to re-schedule your appointment should you arrive 10 or more minutes late.  We strive to give you quality time with our providers, and arriving late affects you and other patients whose appointments are after yours.  Also, if you no show three or more times for appointments you may be dismissed from the clinic at the providers discretion.     Again, thank you for choosing [facility].  Our hope is that these requests will decrease the amount of time that you wait before being seen by our physicians.       _____________________________________________________________  Should you have questions after your visit to [facility], please contact our office at [phone] and follow the prompts.  Our office hours are 8:00 a.m. and 4:30 p.m. Monday - Friday.  Please note that voicemails left after 4:00 p.m. may not be returned until the following business day.  We are closed weekends and major holidays.  You do have access to a nurse 24-7, just call the main number to the clinic [phone] and do not press any options, hold on the line and a nurse will answer the phone.    For prescription refill requests,  have your pharmacy contact our office and allow 72 hours.    Due to Covid, you will need to wear a mask upon entering the hospital. If you do not have a mask, a mask will be given to you at the Main Entrance upon arrival. For doctor visits, patients may have 1 support person age 85 or older with them. For treatment visits, patients can not have anyone with them due to social distancing guidelines and our immunocompromised population.

## 2022-03-05 ENCOUNTER — Other Ambulatory Visit: Payer: Self-pay

## 2022-03-06 ENCOUNTER — Other Ambulatory Visit (HOSPITAL_COMMUNITY): Payer: Self-pay

## 2022-03-07 ENCOUNTER — Other Ambulatory Visit (HOSPITAL_COMMUNITY): Payer: Self-pay

## 2022-03-15 ENCOUNTER — Encounter: Payer: Self-pay | Admitting: Hematology

## 2022-03-19 ENCOUNTER — Other Ambulatory Visit (HOSPITAL_COMMUNITY): Payer: Self-pay

## 2022-03-20 ENCOUNTER — Other Ambulatory Visit (HOSPITAL_COMMUNITY): Payer: Self-pay

## 2022-03-20 ENCOUNTER — Encounter: Payer: Self-pay | Admitting: Hematology

## 2022-03-25 ENCOUNTER — Inpatient Hospital Stay: Payer: Medicare HMO | Attending: Hematology

## 2022-03-25 ENCOUNTER — Inpatient Hospital Stay: Payer: Medicare HMO

## 2022-03-25 ENCOUNTER — Inpatient Hospital Stay (HOSPITAL_BASED_OUTPATIENT_CLINIC_OR_DEPARTMENT_OTHER): Payer: Medicare HMO | Admitting: Hematology

## 2022-03-25 VITALS — BP 130/82 | HR 64 | Temp 98.0°F | Resp 16

## 2022-03-25 DIAGNOSIS — D469 Myelodysplastic syndrome, unspecified: Secondary | ICD-10-CM | POA: Diagnosis not present

## 2022-03-25 DIAGNOSIS — Z95828 Presence of other vascular implants and grafts: Secondary | ICD-10-CM | POA: Diagnosis not present

## 2022-03-25 DIAGNOSIS — D4622 Refractory anemia with excess of blasts 2: Secondary | ICD-10-CM | POA: Insufficient documentation

## 2022-03-25 DIAGNOSIS — Z5111 Encounter for antineoplastic chemotherapy: Secondary | ICD-10-CM | POA: Diagnosis not present

## 2022-03-25 LAB — COMPREHENSIVE METABOLIC PANEL
ALT: 13 U/L (ref 0–44)
AST: 16 U/L (ref 15–41)
Albumin: 3.3 g/dL — ABNORMAL LOW (ref 3.5–5.0)
Alkaline Phosphatase: 69 U/L (ref 38–126)
Anion gap: 8 (ref 5–15)
BUN: 13 mg/dL (ref 8–23)
CO2: 25 mmol/L (ref 22–32)
Calcium: 8.8 mg/dL — ABNORMAL LOW (ref 8.9–10.3)
Chloride: 104 mmol/L (ref 98–111)
Creatinine, Ser: 0.84 mg/dL (ref 0.61–1.24)
GFR, Estimated: >60 mL/min (ref >60)
Glucose, Bld: 102 mg/dL — ABNORMAL HIGH (ref 70–99)
Potassium: 3.6 mmol/L (ref 3.5–5.1)
Sodium: 137 mmol/L (ref 135–145)
Total Bilirubin: 1.1 mg/dL (ref 0.3–1.2)
Total Protein: 6 g/dL — ABNORMAL LOW (ref 6.5–8.1)

## 2022-03-25 LAB — CBC WITH DIFFERENTIAL/PLATELET
Abs Immature Granulocytes: 0 10*3/uL (ref 0.00–0.07)
Basophils Absolute: 0 10*3/uL (ref 0.0–0.1)
Basophils Relative: 1 %
Eosinophils Absolute: 0.4 10*3/uL (ref 0.0–0.5)
Eosinophils Relative: 14 %
HCT: 32.2 % — ABNORMAL LOW (ref 39.0–52.0)
Hemoglobin: 10.5 g/dL — ABNORMAL LOW (ref 13.0–17.0)
Immature Granulocytes: 0 %
Lymphocytes Relative: 36 %
Lymphs Abs: 0.9 10*3/uL (ref 0.7–4.0)
MCH: 29.9 pg (ref 26.0–34.0)
MCHC: 32.6 g/dL (ref 30.0–36.0)
MCV: 91.7 fL (ref 80.0–100.0)
Monocytes Absolute: 0.2 10*3/uL (ref 0.1–1.0)
Monocytes Relative: 9 %
Neutro Abs: 1 10*3/uL — ABNORMAL LOW (ref 1.7–7.7)
Neutrophils Relative %: 40 %
Platelets: 188 10*3/uL (ref 150–400)
RBC: 3.51 MIL/uL — ABNORMAL LOW (ref 4.22–5.81)
RDW: 16.3 % — ABNORMAL HIGH (ref 11.5–15.5)
WBC: 2.5 10*3/uL — ABNORMAL LOW (ref 4.0–10.5)
nRBC: 0 % (ref 0.0–0.2)

## 2022-03-25 LAB — MAGNESIUM: Magnesium: 1.9 mg/dL (ref 1.7–2.4)

## 2022-03-25 MED ORDER — SODIUM CHLORIDE 0.9 % IV SOLN: Freq: Once | INTRAVENOUS | Status: AC

## 2022-03-25 MED ORDER — PALONOSETRON HCL INJECTION 0.25 MG/5ML
0.2500 mg | Freq: Once | INTRAVENOUS | Status: AC
  Administered 2022-03-25: 0.25 mg via INTRAVENOUS
  Filled 2022-03-25: qty 5

## 2022-03-25 MED ORDER — SODIUM CHLORIDE 0.9% FLUSH
10.0000 mL | INTRAVENOUS | Status: DC | PRN
  Administered 2022-03-25: 10 mL

## 2022-03-25 MED ORDER — SODIUM CHLORIDE 0.9 % IV SOLN
10.0000 mg | Freq: Once | INTRAVENOUS | Status: AC
  Administered 2022-03-25: 10 mg via INTRAVENOUS
  Filled 2022-03-25: qty 10

## 2022-03-25 MED ORDER — SODIUM CHLORIDE 0.9 % IV SOLN
75.0000 mg/m2 | Freq: Once | INTRAVENOUS | Status: AC
  Administered 2022-03-25: 140 mg via INTRAVENOUS
  Filled 2022-03-25: qty 14

## 2022-03-25 MED ORDER — SODIUM CHLORIDE 0.9% FLUSH
10.0000 mL | Freq: Once | INTRAVENOUS | Status: AC
  Administered 2022-03-25: 10 mL via INTRAVENOUS

## 2022-03-25 MED ORDER — HEPARIN SOD (PORK) LOCK FLUSH 100 UNIT/ML IV SOLN
500.0000 [IU] | Freq: Once | INTRAVENOUS | Status: AC | PRN
  Administered 2022-03-25: 500 [IU]

## 2022-03-25 NOTE — Progress Notes (Signed)
Patient presents today for Vidaza infusion per providers order.  Vital signs and labs reviewed by the MD.  MD is aware of [place] of 1.  Message received from Chad Lamb, patient okay for treatment.  Treatment given today per MD orders.  Stable during infusion without adverse affects.  Vital signs stable.  No complaints at this time.  Discharge from clinic ambulatory in stable condition.  Alert and oriented X 3.  Follow up with [facility] as scheduled.

## 2022-03-25 NOTE — Patient Instructions (Signed)
[facility]  Discharge Instructions: Thank you for choosing [facility] to provide your oncology and hematology care.  If you have a lab appointment with the [facility], please come in thru the Main Entrance and check in at the main information desk.  Wear comfortable clothing and clothing appropriate for easy access to any Portacath or PICC line.   We strive to give you quality time with your provider. You may need to reschedule your appointment if you arrive late (15 or more minutes).  Arriving late affects you and other patients whose appointments are after yours.  Also, if you miss three or more appointments without notifying the office, you may be dismissed from the clinic at the provider's discretion.      For prescription refill requests, have your pharmacy contact our office and allow 72 hours for refills to be completed.    Today you received the following chemotherapy and/or immunotherapy agents Vidaza      To help prevent nausea and vomiting after your treatment, we encourage you to take your nausea medication as directed.  BELOW ARE SYMPTOMS THAT SHOULD BE REPORTED IMMEDIATELY: *FEVER GREATER THAN 100.4 F (38 C) OR HIGHER *CHILLS OR SWEATING *NAUSEA AND VOMITING THAT IS NOT CONTROLLED WITH YOUR NAUSEA MEDICATION *UNUSUAL SHORTNESS OF BREATH *UNUSUAL BRUISING OR BLEEDING *URINARY PROBLEMS (pain or burning when urinating, or frequent urination) *BOWEL PROBLEMS (unusual diarrhea, constipation, pain near the anus) TENDERNESS IN MOUTH AND THROAT WITH OR WITHOUT PRESENCE OF ULCERS (sore throat, sores in mouth, or a toothache) UNUSUAL RASH, SWELLING OR PAIN  UNUSUAL VAGINAL DISCHARGE OR ITCHING   Items with * indicate a potential emergency and should be followed up as soon as possible or go to the Emergency Department if any problems should occur.  Please show the CHEMOTHERAPY ALERT CARD or IMMUNOTHERAPY ALERT CARD at check-in to the Emergency  Department and triage nurse.  Should you have questions after your visit or need to cancel or reschedule your appointment, please contact [facility] [phone]  and follow the prompts.  Office hours are 8:00 a.m. to 4:30 p.m. Monday - Friday. Please note that voicemails left after 4:00 p.m. may not be returned until the following business day.  We are closed weekends and major holidays. You have access to a nurse at all times for urgent questions. Please call the main number to the clinic [phone] and follow the prompts.  For any non-urgent questions, you may also contact your provider using MyChart. We now offer e-Visits for anyone 5 and older to request care online for non-urgent symptoms. For details visit [email or web address].   Also download the MyChart app! Go to the app store, search "MyChart", open the app, select [facility], and log in with your MyChart username and password.

## 2022-03-25 NOTE — Progress Notes (Signed)
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  INTERVAL HISTORY:  [name], a 77 y.o. male, seen for follow-up of MDS and toxicity assessment prior to next cycle of chemotherapy.  Cycle 8 of chemotherapy was on 02/18/2022 with azacitidine and venetoclax.  He has tolerated venetoclax very well.  Energy levels are 70%.  Denies any fevers or infections.  REVIEW OF SYSTEMS:  Review of Systems  Constitutional:  Negative for appetite change and fatigue.  HENT:   Negative for nosebleeds and trouble swallowing.   Respiratory:  Positive for shortness of breath (Only on exertion while swimming). Negative for hemoptysis.   Gastrointestinal:  Positive for constipation. Negative for blood in stool and nausea.  Genitourinary:  Negative for hematuria.   Hematological:  Does not bruise/bleed easily.  Psychiatric/Behavioral:  Positive for depression. The patient is nervous/anxious.   All other systems reviewed and are negative.   PAST MEDICAL/SURGICAL HISTORY:  Past Medical History:  Diagnosis Date   Allergy    Anxiety    Arthritis    Asthma    Cataract    Clotting disorder ([place])    DVT (deep venous thrombosis) (HCC)     unknown etiology. 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   Pneumonia    Port-A-Cath in place 07/30/2021   Past Surgical History:  Procedure Laterality Date   CATARACT EXTRACTION W/PHACO Right 11/24/2017   Procedure: CATARACT EXTRACTION PHACO AND INTRAOCULAR LENS PLACEMENT RIGHT EYE;  Surgeon: Chad Branch, MD;  Location: [facility];  Service: Ophthalmology;  Laterality: Right;  CDE: 8.61   CATARACT EXTRACTION W/PHACO Left 12/29/2017   Procedure: CATARACT EXTRACTION PHACO AND INTRAOCULAR LENS PLACEMENT (IOC);  Surgeon: Chad Branch, MD;  Location: [facility];  Service: Ophthalmology;  Laterality: Left;  CDE: 7.21   GANGLION CYST EXCISION     HERNIA REPAIR     bilateral   PARS PLANA VITRECTOMY Left 05/17/2021   Procedure: PARS PLANA VITRECTOMY 25 GAUGE WITH ANTIBIOTIC INJECTION  AND ENDOLASER LEFT EYE;  Surgeon: Chad Mullet, MD;  Location: [facility];  Service: Ophthalmology;  Laterality: Left;   PORTACATH PLACEMENT Right 07/09/2021   Procedure: INSERTION PORT-A-CATH;  Surgeon: Chad Signs, MD;  Location: [facility];  Service: General;  Laterality: Right;   TONSILLECTOMY     VASCULAR SURGERY      SOCIAL HISTORY:  Social History   Socioeconomic History   Marital status: Married    Spouse name: Chad Lamb   Number of children: 0   Years of education: 56  Highest education level: High school graduate  Occupational History   Not on file  Tobacco Use   Smoking status: Former    Types: Pipe    Quit date: 03/21/1966    Years since quitting: 56.0   Smokeless tobacco: Never  Vaping Use   Vaping Use: Never used  Substance and Sexual Activity   Alcohol use: Yes    Alcohol/week: 1.0 standard drink of alcohol    Types: 1 Cans of beer per week    Comment: 1 can of beer a week   Drug use: Never   Sexual activity: Not Currently  Other Topics Concern   Not on  file  Social History Narrative   ** Merged History Encounter **       Grew up in [place], [place] and [place].  Retired.  Worked in [employer]. 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    Social Determinants of Health   Financial Resource Strain: Low Risk  (01/23/2017)   Overall Financial Resource Strain (CARDIA)    Difficulty of Paying Living Expenses: Not hard at all  Food Insecurity: No Food Insecurity (01/23/2017)   Hunger Vital Sign    Worried About Running Out of Food in the Last Year: Never true    Ran Out of Food in the Last Year: Never true  Transportation Needs: No Transportation Needs (01/23/2017)   PRAPARE - Hydrologist (Medical): No    Lack of Transportation (Non-Medical): No  Physical Activity: Unknown (01/23/2017)   Exercise Vital Sign    Days of Exercise per Week: 3 days    Minutes of Exercise per Session: Not on file  Stress: Stress Concern Present (01/23/2017)   Chad Lamb    Feeling of Stress : To some extent  Social Connections: Somewhat Isolated (01/23/2017)   Social Connection and Isolation Panel [NHANES]    Frequency of Communication with Friends and Family: More than three times a week    Frequency of Social Gatherings with Friends and Family: More than three times a week    Attends Religious Services: Never    Marine scientist or Organizations: No    Attends Archivist Meetings: Never    Marital Status: Married  Human resources officer Violence: Not At Risk (01/23/2017)   Humiliation, Afraid, Rape, and Kick questionnaire    Fear of Current or Ex-Partner: No    Emotionally Abused: No    Physically Abused: No    Sexually Abused: No    FAMILY HISTORY:  Family History  Problem Relation Age of Onset   Cancer Mother        pancreatic   Arthritis Father    Early death Father 79       Lung infiltrate   Colon  cancer Neg Hx    Colon polyps Neg Hx     CURRENT MEDICATIONS:  Current Outpatient Medications  Medication Sig Dispense Refill   albuterol (PROVENTIL HFA;VENTOLIN HFA) 108 (90 Base) MCG/ACT inhaler Inhale 1-2 puffs into the lungs every 6 (six) hours as needed for wheezing or shortness of breath.     ALPRAZolam (XANAX) 0.5 MG tablet Take 0.5 mg by mouth 2 (two) times daily as needed for anxiety.     amLODipine (NORVASC) 2.5 MG tablet Take 2.5 mg by mouth daily.     azaCITIDine 5 mg/2 mLs in lactated ringers infusion Inject into the vein daily. Days 1-7  every 28 days     fluticasone furoate-vilanterol (BREO ELLIPTA) 200-25 MCG/ACT AEPB Inhale 1 puff into the lungs daily.     Lactulose 20 GM/30ML SOLN Take 30 mLs (20 g total) by mouth at bedtime. 473 mL 3   Lidocaine-Hydrocort, Perianal, 3-0.5 % CREA Apply topically 2 (two) times daily as needed.     Lidocaine-Hydrocortisone Ace 3-0.5 % CREA Apply 1 application topically as directed. (Patient taking differently: Apply 1 application  topically 2 (two) times daily as needed (hemorrhoids).) 30 Tube 11   lidocaine-prilocaine (EMLA) cream Apply topically as needed. Apply to port site prior to infusion as needed 30 g 60   loratadine (CLARITIN) 10 MG tablet Take 10 mg by mouth daily.     Lutein 40 MG CAPS Take 40 mg by mouth daily.     Multiple Vitamins-Minerals (CENTRUM ADULTS PO) Take 1 tablet by mouth daily.     nystatin cream (MYCOSTATIN) Apply 1 application. topically 2 (two) times daily as needed for dry skin.     ofloxacin (OCUFLOX) 0.3 % ophthalmic solution Place 1 drop into the left eye See admin instructions. 1 drop to left eye 4 x daily for 7 days after monthly procedure.     omeprazole (PRILOSEC OTC) 20 MG tablet Take 5-10 mg by mouth daily.     sildenafil (VIAGRA) 50 MG tablet Take 50 mg by mouth daily as needed for erectile dysfunction.     traMADol (ULTRAM) 50 MG tablet Take 1 tablet (50 mg total) by mouth every 6 (six) hours as needed. 15  tablet 0   venetoclax (VENCLEXTA) 100 MG tablet Take 2 tablets (200 mg total) by mouth daily. Take for 14 days, then hold for 14 days. Repeat every 28 days. Take with a meal and a full glass of water. 28 tablet 0   No current facility-administered medications for this visit.    ALLERGIES:  Allergies  Allergen Reactions   Cephalosporins Itching   Amoxapine And Related Itching and Rash    Has patient had a PCN reaction causing immediate rash, facial/tongue/throat swelling, SOB or lightheadedness with hypotension: No Has patient had a PCN reaction causing severe rash involving mucus membranes or skin necrosis: No Has patient had a PCN reaction that required hospitalization: No Has patient had a PCN reaction occurring within the last 10 years: No If all of the above answers are "NO", then may proceed with Cephalosporin use.     Amoxicillin Rash   Cephalexin Rash   Clindamycin/Lincomycin Itching and Rash   Sulfamethoxazole Itching and Rash   Trimethoprim Rash    PHYSICAL EXAM:  Performance status (ECOG): 1 - Symptomatic but completely ambulatory  There were no vitals filed for this visit. Wt Readings from Last 3 Encounters:  03/04/22 150 lb 8 oz (68.3 kg)  02/18/22 152 lb 6.4 oz (69.1 kg)  01/28/22 156 lb 6.4 oz (70.9 kg)   Physical Exam Vitals reviewed.  Constitutional:      Appearance: Normal appearance.  Cardiovascular:     Heart sounds: Normal heart sounds.  Pulmonary:     Effort: Pulmonary effort is normal.     Breath sounds: Normal breath sounds.  Neurological:     Mental Status: He is alert and oriented to person, place, and time.  Psychiatric:        Mood and Affect: Mood normal.        Behavior: Behavior normal.     LABORATORY DATA:  I have reviewed the labs as listed.  Latest Ref Rng & Units 03/04/2022    9:42 AM 02/18/2022   11:26 AM 01/21/2022   10:30 AM  CBC  WBC 4.0 - 10.5 K/uL 0.8  1.4  2.1   Hemoglobin 13.0 - 17.0 g/dL 10.6  10.9  11.1    Hematocrit 39.0 - 52.0 % 31.3  32.8  32.7   Platelets 150 - 400 K/uL 92  159  147       Latest Ref Rng & Units 03/04/2022    9:42 AM 02/18/2022   11:26 AM 01/21/2022   10:30 AM  CMP  Glucose 70 - 99 mg/dL 131  110  99   BUN 8 - 23 mg/dL _0 Creatinine 0.61 - 1.24 mg/dL 0.95  0.90  0.82   Sodium 135 - 145 mmol/L 137  136  138   Potassium 3.5 - 5.1 mmol/L 3.7  3.7  3.8   Chloride 98 - 111 mmol/L 104  104  105   CO2 22 - 32 mmol/L _1 Calcium 8.9 - 10.3 mg/dL 8.7  9.1  9.2   Total Protein 6.5 - 8.1 g/dL 6.0  6.5  6.3   Total Bilirubin 0.3 - 1.2 mg/dL 1.7  1.6  1.1   Alkaline Phos 38 - 126 U/L 69  78  80   AST 15 - 41 U/L _2 ALT 0 - 44 U/L _3 DIAGNOSTIC IMAGING:  I have independently reviewed the scans and discussed with the patient. No results found.   ASSESSMENT:  MDS with EB 2: - Seen at the request of Dr. Mannie Stabile for abnormal CBC. - Labs on 12/06/2020 shows white count 3.0 with 41% neutrophils, 41% lymphocytes, 13.8% monocytes.  ANC was 1.2.  Hemoglobin 13.4 and normal.  MCV was slightly high at 96.  Platelet count was 128.  Vitamin Z61 and folic acid were normal. - He took antibiotics for teeth implants few times this year, last 1 on 11/16/2020.  He does not know the antibiotic name.  He also reports taking over-the-counter pill for memory for 5 to 6 months, which was stopped around September 2022. - Labs from 07/07/2020 with creatinine 0.92.  White count 3.3 (37% neutrophils, 45% lymphocytes, 14% monocytes, 3% eosinophils), ANC 1.2.  Platelet count 149. - His prior CBC from 12/08/2017 in our system was completely normal. - Reports slight worsening of the night sweats in the last 3 months.  At least once per week and has to change clothes.  No fevers or weight loss. - BMBX on 06/04/2021: Hypercellular marrow with dyspoietic changes involving the granulocytic cell line and megakaryocytes.  Myeloblasts 8% on aspirate smears and 10% by flow.   Cytogenetics 71, XY (20).  MDS FISH panel normal. 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 - IPSS-M score of 0.12, moderate high risk.  Leukemia free survival is 2.3 years.  Overall survival 2.8 years. - Cycle 1 of azacitidine on 07/30/2021.  BMBX (01/04/2022): Overall improvement in blast count and cellularity.  Chromosome analysis and FISH panel normal.  Venetoclax 200 mg day 1 through 14 added with cycle 8 on 02/18/2022.   Social/family history: - He lives at home with his wife.  He swims about 40 minutes daily. 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  Non-smoker. - Paternal aunt had breast  cancer and mother had pancreatic cancer.  3.  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 - He underwent thrombolytic therapy at [facility]. - He will followed up with vascular at [facility] and has been on Xarelto since then.   PLAN:  MDS with EB 2: - He has tolerated venetoclax 200 mg days 1 through 14 when added to with cycle 8 very well. - Reviewed his labs today which shows normal LFTs and creatinine.  CBC was grossly normal with ANC improved to 1000 and white count 2.5.  Platelet count is normal. - Proceed with cycle 9 azacitidine for 7 days and venetoclax 200 mg days 1-14. - He is having dental implant surgery on 04/15/2022.  We will check his CBC with differential on 04/12/2022. - RTC 4 weeks for follow-up with repeat labs and treatment.   Unprovoked left subclavian DVT: - Xarelto discontinued due to hemorrhoidal bleeding.  3.  Constipation: - Continue Colace and stimulant once daily and MiraLAX.  Take lactulose as needed.   Orders placed this encounter:  No orders of the defined types were placed in this encounter.     [name], MD [place] [phone]

## 2022-03-25 NOTE — Progress Notes (Signed)
Patient is taking Venetoclaz as prescribed.  He has not missed any doses and reports no side effects at this time.

## 2022-03-25 NOTE — Patient Instructions (Addendum)
[place] at [facility] Discharge Instructions   You were seen and examined today by Dr. Delton Coombes.  He reviewed the results of your lab work which are normal/stable. Your white count has improved.   We will proceed with your treatment today.   Return as scheduled.    Thank you for choosing [place] at [facility] to provide your oncology and hematology care.  To afford each patient quality time with our provider, please arrive at least 15 minutes before your scheduled appointment time.   If you have a lab appointment with the [facility] please come in thru the Main Entrance and check in at the main information desk.  You need to re-schedule your appointment should you arrive 10 or more minutes late.  We strive to give you quality time with our providers, and arriving late affects you and other patients whose appointments are after yours.  Also, if you no show three or more times for appointments you may be dismissed from the clinic at the providers discretion.     Again, thank you for choosing [facility].  Our hope is that these requests will decrease the amount of time that you wait before being seen by our physicians.       _____________________________________________________________  Should you have questions after your visit to [facility], please contact our office at [phone] and follow the prompts.  Our office hours are 8:00 a.m. and 4:30 p.m. Monday - Friday.  Please note that voicemails left after 4:00 p.m. may not be returned until the following business day.  We are closed weekends and major holidays.  You do have access to a nurse 24-7, just call the main number to the clinic [phone] and do not press any options, hold on the line and a nurse will answer the phone.    For prescription refill requests, have your pharmacy contact our office and allow 72 hours.    Due to Covid, you will need  to wear a mask upon entering the hospital. If you do not have a mask, a mask will be given to you at the Main Entrance upon arrival. For doctor visits, patients may have 1 support person age 99 or older with them. For treatment visits, patients can not have anyone with them due to social distancing guidelines and our immunocompromised population.

## 2022-03-26 ENCOUNTER — Inpatient Hospital Stay: Payer: Medicare HMO

## 2022-03-26 ENCOUNTER — Other Ambulatory Visit: Payer: Self-pay

## 2022-03-26 VITALS — BP 115/69 | HR 67 | Temp 97.6°F | Resp 18

## 2022-03-26 DIAGNOSIS — Z5111 Encounter for antineoplastic chemotherapy: Secondary | ICD-10-CM | POA: Diagnosis not present

## 2022-03-26 DIAGNOSIS — D469 Myelodysplastic syndrome, unspecified: Secondary | ICD-10-CM

## 2022-03-26 DIAGNOSIS — Z95828 Presence of other vascular implants and grafts: Secondary | ICD-10-CM

## 2022-03-26 MED ORDER — SODIUM CHLORIDE 0.9 % IV SOLN: Freq: Once | INTRAVENOUS | Status: AC

## 2022-03-26 MED ORDER — SODIUM CHLORIDE 0.9% FLUSH
10.0000 mL | INTRAVENOUS | Status: DC | PRN
  Administered 2022-03-26: 10 mL

## 2022-03-26 MED ORDER — SODIUM CHLORIDE 0.9 % IV SOLN
75.0000 mg/m2 | Freq: Once | INTRAVENOUS | Status: AC
  Administered 2022-03-26: 140 mg via INTRAVENOUS
  Filled 2022-03-26: qty 14

## 2022-03-26 MED ORDER — SODIUM CHLORIDE 0.9 % IV SOLN
10.0000 mg | Freq: Once | INTRAVENOUS | Status: AC
  Administered 2022-03-26: 10 mg via INTRAVENOUS
  Filled 2022-03-26: qty 1

## 2022-03-26 MED ORDER — HEPARIN SOD (PORK) LOCK FLUSH 100 UNIT/ML IV SOLN
500.0000 [IU] | Freq: Once | INTRAVENOUS | Status: AC | PRN
  Administered 2022-03-26: 500 [IU]

## 2022-03-26 NOTE — Progress Notes (Signed)
Patient presents today for Vidaza infusion per providers order.  Vital signs within parameters for treatment.  Patient has no new complaints at this time.    Treatment given today per MD orders.  Stable during infusion without adverse affects.  Vital signs stable.  No complaints at this time.  Discharge from clinic ambulatory in stable condition.  Alert and oriented X 3.  Follow up with [facility] as scheduled.

## 2022-03-26 NOTE — Patient Instructions (Signed)
[facility]  Discharge Instructions: Thank you for choosing [facility] to provide your oncology and hematology care.  If you have a lab appointment with the [facility], please come in thru the Main Entrance and check in at the main information desk.  Wear comfortable clothing and clothing appropriate for easy access to any Portacath or PICC line.   We strive to give you quality time with your provider. You may need to reschedule your appointment if you arrive late (15 or more minutes).  Arriving late affects you and other patients whose appointments are after yours.  Also, if you miss three or more appointments without notifying the office, you may be dismissed from the clinic at the provider's discretion.      For prescription refill requests, have your pharmacy contact our office and allow 72 hours for refills to be completed.    Today you received the following chemotherapy and/or immunotherapy agents vidaza      To help prevent nausea and vomiting after your treatment, we encourage you to take your nausea medication as directed.  BELOW ARE SYMPTOMS THAT SHOULD BE REPORTED IMMEDIATELY: *FEVER GREATER THAN 100.4 F (38 C) OR HIGHER *CHILLS OR SWEATING *NAUSEA AND VOMITING THAT IS NOT CONTROLLED WITH YOUR NAUSEA MEDICATION *UNUSUAL SHORTNESS OF BREATH *UNUSUAL BRUISING OR BLEEDING *URINARY PROBLEMS (pain or burning when urinating, or frequent urination) *BOWEL PROBLEMS (unusual diarrhea, constipation, pain near the anus) TENDERNESS IN MOUTH AND THROAT WITH OR WITHOUT PRESENCE OF ULCERS (sore throat, sores in mouth, or a toothache) UNUSUAL RASH, SWELLING OR PAIN  UNUSUAL VAGINAL DISCHARGE OR ITCHING   Items with * indicate a potential emergency and should be followed up as soon as possible or go to the Emergency Department if any problems should occur.  Please show the CHEMOTHERAPY ALERT CARD or IMMUNOTHERAPY ALERT CARD at check-in to the Emergency  Department and triage nurse.  Should you have questions after your visit or need to cancel or reschedule your appointment, please contact [facility] [phone]  and follow the prompts.  Office hours are 8:00 a.m. to 4:30 p.m. Monday - Friday. Please note that voicemails left after 4:00 p.m. may not be returned until the following business day.  We are closed weekends and major holidays. You have access to a nurse at all times for urgent questions. Please call the main number to the clinic [phone] and follow the prompts.  For any non-urgent questions, you may also contact your provider using MyChart. We now offer e-Visits for anyone 7 and older to request care online for non-urgent symptoms. For details visit [email or web address].   Also download the MyChart app! Go to the app store, search "MyChart", open the app, select [facility], and log in with your MyChart username and password.

## 2022-03-27 ENCOUNTER — Other Ambulatory Visit: Payer: Self-pay

## 2022-03-27 ENCOUNTER — Inpatient Hospital Stay: Payer: Medicare HMO

## 2022-03-27 VITALS — BP 121/81 | HR 68 | Temp 97.4°F | Resp 18

## 2022-03-27 DIAGNOSIS — Z95828 Presence of other vascular implants and grafts: Secondary | ICD-10-CM

## 2022-03-27 DIAGNOSIS — Z5111 Encounter for antineoplastic chemotherapy: Secondary | ICD-10-CM | POA: Diagnosis not present

## 2022-03-27 DIAGNOSIS — D469 Myelodysplastic syndrome, unspecified: Secondary | ICD-10-CM

## 2022-03-27 MED ORDER — PALONOSETRON HCL INJECTION 0.25 MG/5ML
0.2500 mg | Freq: Once | INTRAVENOUS | Status: AC
  Administered 2022-03-27: 0.25 mg via INTRAVENOUS
  Filled 2022-03-27: qty 5

## 2022-03-27 MED ORDER — HEPARIN SOD (PORK) LOCK FLUSH 100 UNIT/ML IV SOLN
500.0000 [IU] | Freq: Once | INTRAVENOUS | Status: AC | PRN
  Administered 2022-03-27: 500 [IU]

## 2022-03-27 MED ORDER — SODIUM CHLORIDE 0.9 % IV SOLN
75.0000 mg/m2 | Freq: Once | INTRAVENOUS | Status: AC
  Administered 2022-03-27: 140 mg via INTRAVENOUS
  Filled 2022-03-27: qty 14

## 2022-03-27 MED ORDER — SODIUM CHLORIDE 0.9 % IV SOLN
10.0000 mg | Freq: Once | INTRAVENOUS | Status: AC
  Administered 2022-03-27: 10 mg via INTRAVENOUS
  Filled 2022-03-27: qty 10

## 2022-03-27 MED ORDER — SODIUM CHLORIDE 0.9% FLUSH
10.0000 mL | INTRAVENOUS | Status: DC | PRN
  Administered 2022-03-27: 10 mL

## 2022-03-27 MED ORDER — SODIUM CHLORIDE 0.9 % IV SOLN: Freq: Once | INTRAVENOUS | Status: AC

## 2022-03-27 NOTE — Progress Notes (Signed)
Patient presents today for Vidaza infusion.  Patient is in satisfactory condition with no new complaints voiced. Vital signs are stable.  We will proceed with treatment per MD orders.  Patient tolerated treatment well with no complaints voiced.  Patient left ambulatory in stable condition.  Vital signs stable at discharge.  Follow up as scheduled.

## 2022-03-27 NOTE — Patient Instructions (Signed)
[facility]  Discharge Instructions: Thank you for choosing [facility] to provide your oncology and hematology care.  If you have a lab appointment with the [facility], please come in thru the Main Entrance and check in at the main information desk.  Wear comfortable clothing and clothing appropriate for easy access to any Portacath or PICC line.   We strive to give you quality time with your provider. You may need to reschedule your appointment if you arrive late (15 or more minutes).  Arriving late affects you and other patients whose appointments are after yours.  Also, if you miss three or more appointments without notifying the office, you may be dismissed from the clinic at the provider's discretion.      For prescription refill requests, have your pharmacy contact our office and allow 72 hours for refills to be completed.    Today you received the following chemotherapy and/or immunotherapy agents Vidaza.  Azacitidine Injection What is this medication? AZACITIDINE (ay [place]) treats blood and bone marrow cancers. It works by slowing down the growth of cancer cells. This medicine may be used for other purposes; ask your health care provider or pharmacist if you have questions. COMMON BRAND NAME(S): Vidaza What should I tell my care team before I take this medication? They need to know if you have any of these conditions: Kidney disease Liver disease Low blood cell levels, such as low white cells, platelets, or red blood cells Low levels of albumin in the blood Low levels of bicarbonate in the blood An unusual or allergic reaction to azacitidine, mannitol, other medications, foods, dyes, or preservatives If you or your partner are pregnant or trying to get pregnant Breast-feeding How should I use this medication? This medication is injected into a vein or under the skin. It is given by your care team in a hospital or clinic  setting. Talk to your care team about the use of this medication in children. While it may be prescribed for children as young as 1 month for selected conditions, precautions do apply. Overdosage: If you think you have taken too much of this medicine contact a poison control center or emergency room at once. NOTE: This medicine is only for you. Do not share this medicine with others. What if I miss a dose? Keep appointments for follow-up doses. It is important not to miss your dose. Call your care team if you are unable to keep an appointment. What may interact with this medication? Interactions are not expected. This list may not describe all possible interactions. Give your health care provider a list of all the medicines, herbs, non-prescription drugs, or dietary supplements you use. Also tell them if you smoke, drink alcohol, or use illegal drugs. Some items may interact with your medicine. What should I watch for while using this medication? Your condition will be monitored carefully while you are receiving this medication. You may need blood work while taking this medication. This medication may make you feel generally unwell. This is not uncommon as chemotherapy can affect healthy cells as well as cancer cells. Report any side effects. Continue your course of treatment even though you feel ill unless your care team tells you to stop. Other product types may be available that contain the medication azacitidine. The injection and oral products should not be used in place of one another. Talk to your care team if you have questions. This medication can cause serious side  effects. To reduce the risk, your care team may give you other medications to take before receiving this one. Be sure to follow the directions from your care team. This medication may increase your risk of getting an infection. Call your care team for advice if you get a fever, chills, sore throat, or other symptoms of a cold or  flu. Do not treat yourself. Try to avoid being around people who are sick. Avoid taking medications that contain aspirin, acetaminophen, ibuprofen, naproxen, or ketoprofen unless instructed by your care team. These medications may hide a fever. This medication may increase your risk to bruise or bleed. Call your care team if you notice any unusual bleeding. Be careful brushing or flossing your teeth or using a toothpick because you may get an infection or bleed more easily. If you have any dental work done, tell your dentist you are receiving this medication. Talk to your care team if you or your partner may be pregnant. Serious birth defects can occur if you take this medication during pregnancy and for 6 months after the last dose. You will need a negative pregnancy test before starting this medication. Contraception is recommended while taking his medication and for 6 months after the last dose. Your care team can help you find the option that works for you. If your partner can get pregnant, use a condom during sex while taking this medication and for 3 months after the last dose. Do not breastfeed while taking this medication and for 1 week after the last dose. This medication may cause infertility. Talk to your care team if you are concerned about your fertility. What side effects may I notice from receiving this medication? Side effects that you should report to your care team as soon as possible: Allergic reactions--skin rash, itching, hives, swelling of the face, lips, tongue, or throat Infection--fever, chills, cough, sore throat, wounds that don't heal, pain or trouble when passing urine, general feeling of discomfort or being unwell Kidney injury--decrease in the amount of urine, swelling of the ankles, hands, or feet Liver injury--right upper belly pain, loss of appetite, nausea, light-colored stool, dark yellow or [place] [name], yellowing skin or eyes, unusual weakness or fatigue Low red  blood cell level--unusual weakness or fatigue, dizziness, headache, trouble breathing Tumor lysis syndrome (TLS)--nausea, vomiting, diarrhea, decrease in the amount of urine, dark urine, unusual weakness or fatigue, confusion, muscle pain or cramps, fast or irregular heartbeat, joint pain Unusual bruising or bleeding Side effects that usually do not require medical attention (report to your care team if they continue or are bothersome): Constipation Diarrhea Nausea Pain, redness, or irritation at injection site Vomiting This list may not describe all possible side effects. Call your doctor for medical advice about side effects. You may report side effects to FDA at 1-800-FDA-1088. Where should I keep my medication? This medication is given in a hospital or clinic. It will not be stored at home. NOTE: This sheet is a summary. It may not cover all possible information. If you have questions about this medicine, talk to your doctor, pharmacist, or health care provider.  2023 Elsevier/Gold Standard (2021-07-19 00:00:00)        To help prevent nausea and vomiting after your treatment, we encourage you to take your nausea medication as directed.  BELOW ARE SYMPTOMS THAT SHOULD BE REPORTED IMMEDIATELY: *FEVER GREATER THAN 100.4 F (38 C) OR HIGHER *CHILLS OR SWEATING *NAUSEA AND VOMITING THAT IS NOT CONTROLLED WITH YOUR NAUSEA MEDICATION *UNUSUAL SHORTNESS OF  BREATH *UNUSUAL BRUISING OR BLEEDING *URINARY PROBLEMS (pain or burning when urinating, or frequent urination) *BOWEL PROBLEMS (unusual diarrhea, constipation, pain near the anus) TENDERNESS IN MOUTH AND THROAT WITH OR WITHOUT PRESENCE OF ULCERS (sore throat, sores in mouth, or a toothache) UNUSUAL RASH, SWELLING OR PAIN  UNUSUAL VAGINAL DISCHARGE OR ITCHING   Items with * indicate a potential emergency and should be followed up as soon as possible or go to the Emergency Department if any problems should occur.  Please show the  CHEMOTHERAPY ALERT CARD or IMMUNOTHERAPY ALERT CARD at check-in to the Emergency Department and triage nurse.  Should you have questions after your visit or need to cancel or reschedule your appointment, please contact [facility] [phone]  and follow the prompts.  Office hours are 8:00 a.m. to 4:30 p.m. Monday - Friday. Please note that voicemails left after 4:00 p.m. may not be returned until the following business day.  We are closed weekends and major holidays. You have access to a nurse at all times for urgent questions. Please call the main number to the clinic [phone] and follow the prompts.  For any non-urgent questions, you may also contact your provider using MyChart. We now offer e-Visits for anyone 66 and older to request care online for non-urgent symptoms. For details visit [email or web address].   Also download the MyChart app! Go to the app store, search "MyChart", open the app, select , and log in with your MyChart username and password.

## 2022-03-28 ENCOUNTER — Inpatient Hospital Stay: Payer: Medicare HMO

## 2022-03-28 VITALS — BP 124/80 | HR 67 | Temp 98.2°F | Resp 16

## 2022-03-28 DIAGNOSIS — Z95828 Presence of other vascular implants and grafts: Secondary | ICD-10-CM

## 2022-03-28 DIAGNOSIS — Z5111 Encounter for antineoplastic chemotherapy: Secondary | ICD-10-CM | POA: Diagnosis not present

## 2022-03-28 DIAGNOSIS — D469 Myelodysplastic syndrome, unspecified: Secondary | ICD-10-CM

## 2022-03-28 MED ORDER — SODIUM CHLORIDE 0.9% FLUSH
10.0000 mL | INTRAVENOUS | Status: DC | PRN
  Administered 2022-03-28: 10 mL

## 2022-03-28 MED ORDER — SODIUM CHLORIDE 0.9 % IV SOLN
75.0000 mg/m2 | Freq: Once | INTRAVENOUS | Status: AC
  Administered 2022-03-28: 140 mg via INTRAVENOUS
  Filled 2022-03-28: qty 14

## 2022-03-28 MED ORDER — HEPARIN SOD (PORK) LOCK FLUSH 100 UNIT/ML IV SOLN
500.0000 [IU] | Freq: Once | INTRAVENOUS | Status: AC | PRN
  Administered 2022-03-28: 500 [IU]

## 2022-03-28 MED ORDER — SODIUM CHLORIDE 0.9 % IV SOLN: Freq: Once | INTRAVENOUS | Status: AC

## 2022-03-28 MED ORDER — SODIUM CHLORIDE 0.9 % IV SOLN
10.0000 mg | Freq: Once | INTRAVENOUS | Status: AC
  Administered 2022-03-28: 10 mg via INTRAVENOUS
  Filled 2022-03-28: qty 1

## 2022-03-28 NOTE — Patient Instructions (Signed)
[facility]  Discharge Instructions: Thank you for choosing [facility] to provide your oncology and hematology care.  If you have a lab appointment with the [facility], please come in thru the Main Entrance and check in at the main information desk.  Wear comfortable clothing and clothing appropriate for easy access to any Portacath or PICC line.   We strive to give you quality time with your provider. You may need to reschedule your appointment if you arrive late (15 or more minutes).  Arriving late affects you and other patients whose appointments are after yours.  Also, if you miss three or more appointments without notifying the office, you may be dismissed from the clinic at the provider's discretion.      For prescription refill requests, have your pharmacy contact our office and allow 72 hours for refills to be completed.    Today you received the following chemotherapy and/or immunotherapy agents Vidaza Azacitidine Injection What is this medication? AZACITIDINE (ay [place]) treats blood and bone marrow cancers. It works by slowing down the growth of cancer cells. This medicine may be used for other purposes; ask your health care provider or pharmacist if you have questions. COMMON BRAND NAME(S): Vidaza What should I tell my care team before I take this medication? They need to know if you have any of these conditions: Kidney disease Liver disease Low blood cell levels, such as low white cells, platelets, or red blood cells Low levels of albumin in the blood Low levels of bicarbonate in the blood An unusual or allergic reaction to azacitidine, mannitol, other medications, foods, dyes, or preservatives If you or your partner are pregnant or trying to get pregnant Breast-feeding How should I use this medication? This medication is injected into a vein or under the skin. It is given by your care team in a hospital or clinic setting. Talk  to your care team about the use of this medication in children. While it may be prescribed for children as young as 1 month for selected conditions, precautions do apply. Overdosage: If you think you have taken too much of this medicine contact a poison control center or emergency room at once. NOTE: This medicine is only for you. Do not share this medicine with others. What if I miss a dose? Keep appointments for follow-up doses. It is important not to miss your dose. Call your care team if you are unable to keep an appointment. What may interact with this medication? Interactions are not expected. This list may not describe all possible interactions. Give your health care provider a list of all the medicines, herbs, non-prescription drugs, or dietary supplements you use. Also tell them if you smoke, drink alcohol, or use illegal drugs. Some items may interact with your medicine. What should I watch for while using this medication? Your condition will be monitored carefully while you are receiving this medication. You may need blood work while taking this medication. This medication may make you feel generally unwell. This is not uncommon as chemotherapy can affect healthy cells as well as cancer cells. Report any side effects. Continue your course of treatment even though you feel ill unless your care team tells you to stop. Other product types may be available that contain the medication azacitidine. The injection and oral products should not be used in place of one another. Talk to your care team if you have questions. This medication can cause serious side effects.  To reduce the risk, your care team may give you other medications to take before receiving this one. Be sure to follow the directions from your care team. This medication may increase your risk of getting an infection. Call your care team for advice if you get a fever, chills, sore throat, or other symptoms of a cold or flu. Do not treat  yourself. Try to avoid being around people who are sick. Avoid taking medications that contain aspirin, acetaminophen, ibuprofen, naproxen, or ketoprofen unless instructed by your care team. These medications may hide a fever. This medication may increase your risk to bruise or bleed. Call your care team if you notice any unusual bleeding. Be careful brushing or flossing your teeth or using a toothpick because you may get an infection or bleed more easily. If you have any dental work done, tell your dentist you are receiving this medication. Talk to your care team if you or your partner may be pregnant. Serious birth defects can occur if you take this medication during pregnancy and for 6 months after the last dose. You will need a negative pregnancy test before starting this medication. Contraception is recommended while taking his medication and for 6 months after the last dose. Your care team can help you find the option that works for you. If your partner can get pregnant, use a condom during sex while taking this medication and for 3 months after the last dose. Do not breastfeed while taking this medication and for 1 week after the last dose. This medication may cause infertility. Talk to your care team if you are concerned about your fertility. What side effects may I notice from receiving this medication? Side effects that you should report to your care team as soon as possible: Allergic reactions--skin rash, itching, hives, swelling of the face, lips, tongue, or throat Infection--fever, chills, cough, sore throat, wounds that don't heal, pain or trouble when passing urine, general feeling of discomfort or being unwell Kidney injury--decrease in the amount of urine, swelling of the ankles, hands, or feet Liver injury--right upper belly pain, loss of appetite, nausea, light-colored stool, dark yellow or brown urine, yellowing skin or eyes, unusual weakness or fatigue Low red blood cell  level--unusual weakness or fatigue, dizziness, headache, trouble breathing Tumor lysis syndrome (TLS)--nausea, vomiting, diarrhea, decrease in the amount of urine, dark urine, unusual weakness or fatigue, confusion, muscle pain or cramps, fast or irregular heartbeat, joint pain Unusual bruising or bleeding Side effects that usually do not require medical attention (report to your care team if they continue or are bothersome): Constipation Diarrhea Nausea Pain, redness, or irritation at injection site Vomiting This list may not describe all possible side effects. Call your doctor for medical advice about side effects. You may report side effects to FDA at 1-800-FDA-1088. Where should I keep my medication? This medication is given in a hospital or clinic. It will not be stored at home. NOTE: This sheet is a summary. It may not cover all possible information. If you have questions about this medicine, talk to your doctor, pharmacist, or health care provider.  2023 Elsevier/Gold Standard (2021-07-19 00:00:00)       To help prevent nausea and vomiting after your treatment, we encourage you to take your nausea medication as directed.  BELOW ARE SYMPTOMS THAT SHOULD BE REPORTED IMMEDIATELY: *FEVER GREATER THAN 100.4 F (38 C) OR HIGHER *CHILLS OR SWEATING *NAUSEA AND VOMITING THAT IS NOT CONTROLLED WITH YOUR NAUSEA MEDICATION *UNUSUAL SHORTNESS OF BREATH *UNUSUAL  BRUISING OR BLEEDING *URINARY PROBLEMS (pain or burning when urinating, or frequent urination) *BOWEL PROBLEMS (unusual diarrhea, constipation, pain near the anus) TENDERNESS IN MOUTH AND THROAT WITH OR WITHOUT PRESENCE OF ULCERS (sore throat, sores in mouth, or a toothache) UNUSUAL RASH, SWELLING OR PAIN  UNUSUAL VAGINAL DISCHARGE OR ITCHING   Items with * indicate a potential emergency and should be followed up as soon as possible or go to the Emergency Department if any problems should occur.  Please show the CHEMOTHERAPY ALERT  CARD or IMMUNOTHERAPY ALERT CARD at check-in to the Emergency Department and triage nurse.  Should you have questions after your visit or need to cancel or reschedule your appointment, please contact [facility] [phone]  and follow the prompts.  Office hours are 8:00 a.m. to 4:30 p.m. Monday - Friday. Please note that voicemails left after 4:00 p.m. may not be returned until the following business day.  We are closed weekends and major holidays. You have access to a nurse at all times for urgent questions. Please call the main number to the clinic [phone] and follow the prompts.  For any non-urgent questions, you may also contact your provider using MyChart. We now offer e-Visits for anyone 71 and older to request care online for non-urgent symptoms. For details visit [email or web address].   Also download the MyChart app! Go to the app store, search "MyChart", open the app, select [facility], and log in with your MyChart username and password.

## 2022-03-28 NOTE — Progress Notes (Signed)
Patient presents today for Vidaza D4 C9. Vital signs within parameters for treatment. Patient has no complaints of any changes sine his last treatment. MAR reviewed and updated.   Treatment given today per MD orders. Tolerated infusion without adverse affects. Vital signs stable. No complaints at this time. Discharged from clinic ambulatory in stable condition. Alert and oriented x 3. F/U with [facility] as scheduled.

## 2022-03-29 ENCOUNTER — Inpatient Hospital Stay: Payer: Medicare HMO

## 2022-03-29 VITALS — BP 117/76 | HR 61 | Temp 97.7°F | Resp 17

## 2022-03-29 DIAGNOSIS — Z5111 Encounter for antineoplastic chemotherapy: Secondary | ICD-10-CM | POA: Diagnosis not present

## 2022-03-29 DIAGNOSIS — D469 Myelodysplastic syndrome, unspecified: Secondary | ICD-10-CM

## 2022-03-29 DIAGNOSIS — Z95828 Presence of other vascular implants and grafts: Secondary | ICD-10-CM

## 2022-03-29 MED ORDER — SODIUM CHLORIDE 0.9 % IV SOLN
75.0000 mg/m2 | Freq: Once | INTRAVENOUS | Status: AC
  Administered 2022-03-29: 140 mg via INTRAVENOUS
  Filled 2022-03-29: qty 14

## 2022-03-29 MED ORDER — PALONOSETRON HCL INJECTION 0.25 MG/5ML
0.2500 mg | Freq: Once | INTRAVENOUS | Status: AC
  Administered 2022-03-29: 0.25 mg via INTRAVENOUS
  Filled 2022-03-29: qty 5

## 2022-03-29 MED ORDER — HEPARIN SOD (PORK) LOCK FLUSH 100 UNIT/ML IV SOLN
500.0000 [IU] | Freq: Once | INTRAVENOUS | Status: AC | PRN
  Administered 2022-03-29: 500 [IU]

## 2022-03-29 MED ORDER — SODIUM CHLORIDE 0.9 % IV SOLN: Freq: Once | INTRAVENOUS | Status: AC

## 2022-03-29 MED ORDER — SODIUM CHLORIDE 0.9 % IV SOLN
10.0000 mg | Freq: Once | INTRAVENOUS | Status: AC
  Administered 2022-03-29: 10 mg via INTRAVENOUS
  Filled 2022-03-29: qty 1

## 2022-03-29 MED ORDER — SODIUM CHLORIDE 0.9% FLUSH
10.0000 mL | INTRAVENOUS | Status: DC | PRN
  Administered 2022-03-29: 10 mL

## 2022-03-29 NOTE — Progress Notes (Signed)
Patient presents today for Vidaza infusion.  Patient is in satisfactory condition with no new complaints voiced.  Vital signs are stable.  We will proceed with treatment per MD orders.  Patient tolerated treatment well with no complaints voiced.  Patient left ambulatory in stable condition.  Vital signs stable at discharge.  Follow up as scheduled.

## 2022-03-29 NOTE — Patient Instructions (Signed)
[facility]  Discharge Instructions: Thank you for choosing [facility] to provide your oncology and hematology care.  If you have a lab appointment with the [facility], please come in thru the Main Entrance and check in at the main information desk.  Wear comfortable clothing and clothing appropriate for easy access to any Portacath or PICC line.   We strive to give you quality time with your provider. You may need to reschedule your appointment if you arrive late (15 or more minutes).  Arriving late affects you and other patients whose appointments are after yours.  Also, if you miss three or more appointments without notifying the office, you may be dismissed from the clinic at the provider's discretion.      For prescription refill requests, have your pharmacy contact our office and allow 72 hours for refills to be completed.    Today you received the following chemotherapy and/or immunotherapy agents Vidaza.  Azacitidine Injection What is this medication? AZACITIDINE (ay [place]) treats blood and bone marrow cancers. It works by slowing down the growth of cancer cells. This medicine may be used for other purposes; ask your health care provider or pharmacist if you have questions. COMMON BRAND NAME(S): Vidaza What should I tell my care team before I take this medication? They need to know if you have any of these conditions: Kidney disease Liver disease Low blood cell levels, such as low white cells, platelets, or red blood cells Low levels of albumin in the blood Low levels of bicarbonate in the blood An unusual or allergic reaction to azacitidine, mannitol, other medications, foods, dyes, or preservatives If you or your partner are pregnant or trying to get pregnant Breast-feeding How should I use this medication? This medication is injected into a vein or under the skin. It is given by your care team in a hospital or clinic  setting. Talk to your care team about the use of this medication in children. While it may be prescribed for children as young as 1 month for selected conditions, precautions do apply. Overdosage: If you think you have taken too much of this medicine contact a poison control center or emergency room at once. NOTE: This medicine is only for you. Do not share this medicine with others. What if I miss a dose? Keep appointments for follow-up doses. It is important not to miss your dose. Call your care team if you are unable to keep an appointment. What may interact with this medication? Interactions are not expected. This list may not describe all possible interactions. Give your health care provider a list of all the medicines, herbs, non-prescription drugs, or dietary supplements you use. Also tell them if you smoke, drink alcohol, or use illegal drugs. Some items may interact with your medicine. What should I watch for while using this medication? Your condition will be monitored carefully while you are receiving this medication. You may need blood work while taking this medication. This medication may make you feel generally unwell. This is not uncommon as chemotherapy can affect healthy cells as well as cancer cells. Report any side effects. Continue your course of treatment even though you feel ill unless your care team tells you to stop. Other product types may be available that contain the medication azacitidine. The injection and oral products should not be used in place of one another. Talk to your care team if you have questions. This medication can cause serious side  effects. To reduce the risk, your care team may give you other medications to take before receiving this one. Be sure to follow the directions from your care team. This medication may increase your risk of getting an infection. Call your care team for advice if you get a fever, chills, sore throat, or other symptoms of a cold or  flu. Do not treat yourself. Try to avoid being around people who are sick. Avoid taking medications that contain aspirin, acetaminophen, ibuprofen, naproxen, or ketoprofen unless instructed by your care team. These medications may hide a fever. This medication may increase your risk to bruise or bleed. Call your care team if you notice any unusual bleeding. Be careful brushing or flossing your teeth or using a toothpick because you may get an infection or bleed more easily. If you have any dental work done, tell your dentist you are receiving this medication. Talk to your care team if you or your partner may be pregnant. Serious birth defects can occur if you take this medication during pregnancy and for 6 months after the last dose. You will need a negative pregnancy test before starting this medication. Contraception is recommended while taking his medication and for 6 months after the last dose. Your care team can help you find the option that works for you. If your partner can get pregnant, use a condom during sex while taking this medication and for 3 months after the last dose. Do not breastfeed while taking this medication and for 1 week after the last dose. This medication may cause infertility. Talk to your care team if you are concerned about your fertility. What side effects may I notice from receiving this medication? Side effects that you should report to your care team as soon as possible: Allergic reactions--skin rash, itching, hives, swelling of the face, lips, tongue, or throat Infection--fever, chills, cough, sore throat, wounds that don't heal, pain or trouble when passing urine, general feeling of discomfort or being unwell Kidney injury--decrease in the amount of urine, swelling of the ankles, hands, or feet Liver injury--right upper belly pain, loss of appetite, nausea, light-colored stool, dark yellow or [place]ona Bulman urine, yellowing skin or eyes, unusual weakness or fatigue Low red  blood cell level--unusual weakness or fatigue, dizziness, headache, trouble breathing Tumor lysis syndrome (TLS)--nausea, vomiting, diarrhea, decrease in the amount of urine, dark urine, unusual weakness or fatigue, confusion, muscle pain or cramps, fast or irregular heartbeat, joint pain Unusual bruising or bleeding Side effects that usually do not require medical attention (report to your care team if they continue or are bothersome): Constipation Diarrhea Nausea Pain, redness, or irritation at injection site Vomiting This list may not describe all possible side effects. Call your doctor for medical advice about side effects. You may report side effects to FDA at 1-800-FDA-1088. Where should I keep my medication? This medication is given in a hospital or clinic. It will not be stored at home. NOTE: This sheet is a summary. It may not cover all possible information. If you have questions about this medicine, talk to your doctor, pharmacist, or health care provider.  2023 Elsevier/Gold Standard (2021-07-19 00:00:00)        To help prevent nausea and vomiting after your treatment, we encourage you to take your nausea medication as directed.  BELOW ARE SYMPTOMS THAT SHOULD BE REPORTED IMMEDIATELY: *FEVER GREATER THAN 100.4 F (38 C) OR HIGHER *CHILLS OR SWEATING *NAUSEA AND VOMITING THAT IS NOT CONTROLLED WITH YOUR NAUSEA MEDICATION *UNUSUAL SHORTNESS OF  BREATH *UNUSUAL BRUISING OR BLEEDING *URINARY PROBLEMS (pain or burning when urinating, or frequent urination) *BOWEL PROBLEMS (unusual diarrhea, constipation, pain near the anus) TENDERNESS IN MOUTH AND THROAT WITH OR WITHOUT PRESENCE OF ULCERS (sore throat, sores in mouth, or a toothache) UNUSUAL RASH, SWELLING OR PAIN  UNUSUAL VAGINAL DISCHARGE OR ITCHING   Items with * indicate a potential emergency and should be followed up as soon as possible or go to the Emergency Department if any problems should occur.  Please show the  CHEMOTHERAPY ALERT CARD or IMMUNOTHERAPY ALERT CARD at check-in to the Emergency Department and triage nurse.  Should you have questions after your visit or need to cancel or reschedule your appointment, please contact [facility] [phone]  and follow the prompts.  Office hours are 8:00 a.m. to 4:30 p.m. Monday - Friday. Please note that voicemails left after 4:00 p.m. may not be returned until the following business day.  We are closed weekends and major holidays. You have access to a nurse at all times for urgent questions. Please call the main number to the clinic [phone] and follow the prompts.  For any non-urgent questions, you may also contact your provider using MyChart. We now offer e-Visits for anyone 69 and older to request care online for non-urgent symptoms. For details visit [email or web address].   Also download the MyChart app! Go to the app store, search "MyChart", open the app, select [facility], and log in with your MyChart username and password.

## 2022-04-01 ENCOUNTER — Inpatient Hospital Stay: Payer: Medicare HMO

## 2022-04-01 VITALS — BP 130/81 | HR 65 | Temp 97.5°F | Resp 17

## 2022-04-01 DIAGNOSIS — Z95828 Presence of other vascular implants and grafts: Secondary | ICD-10-CM

## 2022-04-01 DIAGNOSIS — Z5111 Encounter for antineoplastic chemotherapy: Secondary | ICD-10-CM | POA: Diagnosis not present

## 2022-04-01 DIAGNOSIS — D469 Myelodysplastic syndrome, unspecified: Secondary | ICD-10-CM

## 2022-04-01 MED ORDER — SODIUM CHLORIDE 0.9 % IV SOLN
75.0000 mg/m2 | Freq: Once | INTRAVENOUS | Status: AC
  Administered 2022-04-01: 140 mg via INTRAVENOUS
  Filled 2022-04-01: qty 14

## 2022-04-01 MED ORDER — HEPARIN SOD (PORK) LOCK FLUSH 100 UNIT/ML IV SOLN
500.0000 [IU] | Freq: Once | INTRAVENOUS | Status: AC | PRN
  Administered 2022-04-01: 500 [IU]

## 2022-04-01 MED ORDER — SODIUM CHLORIDE 0.9 % IV SOLN
10.0000 mg | Freq: Once | INTRAVENOUS | Status: AC
  Administered 2022-04-01: 10 mg via INTRAVENOUS
  Filled 2022-04-01: qty 1

## 2022-04-01 MED ORDER — SODIUM CHLORIDE 0.9 % IV SOLN: Freq: Once | INTRAVENOUS | Status: AC

## 2022-04-01 MED ORDER — PALONOSETRON HCL INJECTION 0.25 MG/5ML
0.2500 mg | Freq: Once | INTRAVENOUS | Status: AC
  Administered 2022-04-01: 0.25 mg via INTRAVENOUS
  Filled 2022-04-01: qty 5

## 2022-04-01 MED ORDER — SODIUM CHLORIDE 0.9% FLUSH
10.0000 mL | INTRAVENOUS | Status: DC | PRN
  Administered 2022-04-01: 10 mL

## 2022-04-01 NOTE — Patient Instructions (Signed)
[facility]  Discharge Instructions: Thank you for choosing [facility] to provide your oncology and hematology care.  If you have a lab appointment with the [facility], please come in thru the Main Entrance and check in at the main information desk.  Wear comfortable clothing and clothing appropriate for easy access to any Portacath or PICC line.   We strive to give you quality time with your provider. You may need to reschedule your appointment if you arrive late (15 or more minutes).  Arriving late affects you and other patients whose appointments are after yours.  Also, if you miss three or more appointments without notifying the office, you may be dismissed from the clinic at the provider's discretion.      For prescription refill requests, have your pharmacy contact our office and allow 72 hours for refills to be completed.    Today you received the following chemotherapy and/or immunotherapy agents Vidaza. Azacitidine Injection What is this medication? AZACITIDINE (ay [place]) treats blood and bone marrow cancers. It works by slowing down the growth of cancer cells. This medicine may be used for other purposes; ask your health care provider or pharmacist if you have questions. COMMON BRAND NAME(S): Vidaza What should I tell my care team before I take this medication? They need to know if you have any of these conditions: Kidney disease Liver disease Low blood cell levels, such as low white cells, platelets, or red blood cells Low levels of albumin in the blood Low levels of bicarbonate in the blood An unusual or allergic reaction to azacitidine, mannitol, other medications, foods, dyes, or preservatives If you or your partner are pregnant or trying to get pregnant Breast-feeding How should I use this medication? This medication is injected into a vein or under the skin. It is given by your care team in a hospital or clinic setting. Talk  to your care team about the use of this medication in children. While it may be prescribed for children as young as 1 month for selected conditions, precautions do apply. Overdosage: If you think you have taken too much of this medicine contact a poison control center or emergency room at once. NOTE: This medicine is only for you. Do not share this medicine with others. What if I miss a dose? Keep appointments for follow-up doses. It is important not to miss your dose. Call your care team if you are unable to keep an appointment. What may interact with this medication? Interactions are not expected. This list may not describe all possible interactions. Give your health care provider a list of all the medicines, herbs, non-prescription drugs, or dietary supplements you use. Also tell them if you smoke, drink alcohol, or use illegal drugs. Some items may interact with your medicine. What should I watch for while using this medication? Your condition will be monitored carefully while you are receiving this medication. You may need blood work while taking this medication. This medication may make you feel generally unwell. This is not uncommon as chemotherapy can affect healthy cells as well as cancer cells. Report any side effects. Continue your course of treatment even though you feel ill unless your care team tells you to stop. Other product types may be available that contain the medication azacitidine. The injection and oral products should not be used in place of one another. Talk to your care team if you have questions. This medication can cause serious side effects.  To reduce the risk, your care team may give you other medications to take before receiving this one. Be sure to follow the directions from your care team. This medication may increase your risk of getting an infection. Call your care team for advice if you get a fever, chills, sore throat, or other symptoms of a cold or flu. Do not treat  yourself. Try to avoid being around people who are sick. Avoid taking medications that contain aspirin, acetaminophen, ibuprofen, naproxen, or ketoprofen unless instructed by your care team. These medications may hide a fever. This medication may increase your risk to bruise or bleed. Call your care team if you notice any unusual bleeding. Be careful brushing or flossing your teeth or using a toothpick because you may get an infection or bleed more easily. If you have any dental work done, tell your dentist you are receiving this medication. Talk to your care team if you or your partner may be pregnant. Serious birth defects can occur if you take this medication during pregnancy and for 6 months after the last dose. You will need a negative pregnancy test before starting this medication. Contraception is recommended while taking his medication and for 6 months after the last dose. Your care team can help you find the option that works for you. If your partner can get pregnant, use a condom during sex while taking this medication and for 3 months after the last dose. Do not breastfeed while taking this medication and for 1 week after the last dose. This medication may cause infertility. Talk to your care team if you are concerned about your fertility. What side effects may I notice from receiving this medication? Side effects that you should report to your care team as soon as possible: Allergic reactions--skin rash, itching, hives, swelling of the face, lips, tongue, or throat Infection--fever, chills, cough, sore throat, wounds that don't heal, pain or trouble when passing urine, general feeling of discomfort or being unwell Kidney injury--decrease in the amount of urine, swelling of the ankles, hands, or feet Liver injury--right upper belly pain, loss of appetite, nausea, light-colored stool, dark yellow or brown urine, yellowing skin or eyes, unusual weakness or fatigue Low red blood cell  level--unusual weakness or fatigue, dizziness, headache, trouble breathing Tumor lysis syndrome (TLS)--nausea, vomiting, diarrhea, decrease in the amount of urine, dark urine, unusual weakness or fatigue, confusion, muscle pain or cramps, fast or irregular heartbeat, joint pain Unusual bruising or bleeding Side effects that usually do not require medical attention (report to your care team if they continue or are bothersome): Constipation Diarrhea Nausea Pain, redness, or irritation at injection site Vomiting This list may not describe all possible side effects. Call your doctor for medical advice about side effects. You may report side effects to FDA at 1-800-FDA-1088. Where should I keep my medication? This medication is given in a hospital or clinic. It will not be stored at home. NOTE: This sheet is a summary. It may not cover all possible information. If you have questions about this medicine, talk to your doctor, pharmacist, or health care provider.  2023 Elsevier/Gold Standard (2021-07-19 00:00:00)       To help prevent nausea and vomiting after your treatment, we encourage you to take your nausea medication as directed.  BELOW ARE SYMPTOMS THAT SHOULD BE REPORTED IMMEDIATELY: *FEVER GREATER THAN 100.4 F (38 C) OR HIGHER *CHILLS OR SWEATING *NAUSEA AND VOMITING THAT IS NOT CONTROLLED WITH YOUR NAUSEA MEDICATION *UNUSUAL SHORTNESS OF BREATH *UNUSUAL  BRUISING OR BLEEDING *URINARY PROBLEMS (pain or burning when urinating, or frequent urination) *BOWEL PROBLEMS (unusual diarrhea, constipation, pain near the anus) TENDERNESS IN MOUTH AND THROAT WITH OR WITHOUT PRESENCE OF ULCERS (sore throat, sores in mouth, or a toothache) UNUSUAL RASH, SWELLING OR PAIN  UNUSUAL VAGINAL DISCHARGE OR ITCHING   Items with * indicate a potential emergency and should be followed up as soon as possible or go to the Emergency Department if any problems should occur.  Please show the CHEMOTHERAPY ALERT  CARD or IMMUNOTHERAPY ALERT CARD at check-in to the Emergency Department and triage nurse.  Should you have questions after your visit or need to cancel or reschedule your appointment, please contact [facility] [phone]  and follow the prompts.  Office hours are 8:00 a.m. to 4:30 p.m. Monday - Friday. Please note that voicemails left after 4:00 p.m. may not be returned until the following business day.  We are closed weekends and major holidays. You have access to a nurse at all times for urgent questions. Please call the main number to the clinic [phone] and follow the prompts.  For any non-urgent questions, you may also contact your provider using MyChart. We now offer e-Visits for anyone 31 and older to request care online for non-urgent symptoms. For details visit [email or web address].   Also download the MyChart app! Go to the app store, search "MyChart", open the app, select [facility], and log in with your MyChart username and password.

## 2022-04-01 NOTE — Addendum Note (Signed)
Addended by: Henreitta Leber E on: 04/01/2022 03:44 PM   Modules accepted: Orders

## 2022-04-01 NOTE — Progress Notes (Signed)
Patient presents today for Vidaza D6. No labs needed on day 6 per Dr. Delton Coombes. Vital signs within parameters for treatment . Patient has no complaints of any changes or side effects related to last treatment. MAR reviewed and updated.   Treatment given today per MD orders. Tolerated infusion without adverse affects. Vital signs stable. No complaints at this time. Discharged from clinic ambulatory in stable condition. Alert and oriented x 3. F/U with [facility] as scheduled.

## 2022-04-02 ENCOUNTER — Inpatient Hospital Stay: Payer: Medicare HMO

## 2022-04-02 VITALS — BP 137/85 | HR 68 | Temp 98.0°F | Resp 18

## 2022-04-02 DIAGNOSIS — Z5111 Encounter for antineoplastic chemotherapy: Secondary | ICD-10-CM | POA: Diagnosis not present

## 2022-04-02 DIAGNOSIS — D469 Myelodysplastic syndrome, unspecified: Secondary | ICD-10-CM

## 2022-04-02 DIAGNOSIS — Z95828 Presence of other vascular implants and grafts: Secondary | ICD-10-CM

## 2022-04-02 MED ORDER — SODIUM CHLORIDE 0.9 % IV SOLN: Freq: Once | INTRAVENOUS | Status: AC

## 2022-04-02 MED ORDER — SODIUM CHLORIDE 0.9% FLUSH
10.0000 mL | INTRAVENOUS | Status: DC | PRN
  Administered 2022-04-02: 10 mL

## 2022-04-02 MED ORDER — HEPARIN SOD (PORK) LOCK FLUSH 100 UNIT/ML IV SOLN
500.0000 [IU] | Freq: Once | INTRAVENOUS | Status: AC | PRN
  Administered 2022-04-02: 500 [IU]

## 2022-04-02 MED ORDER — SODIUM CHLORIDE 0.9 % IV SOLN
10.0000 mg | Freq: Once | INTRAVENOUS | Status: AC
  Administered 2022-04-02: 10 mg via INTRAVENOUS
  Filled 2022-04-02: qty 10

## 2022-04-02 MED ORDER — SODIUM CHLORIDE 0.9 % IV SOLN
75.0000 mg/m2 | Freq: Once | INTRAVENOUS | Status: AC
  Administered 2022-04-02: 140 mg via INTRAVENOUS
  Filled 2022-04-02: qty 14

## 2022-04-02 NOTE — Progress Notes (Signed)
Patient presents today for D7 Vidaza. Patient has no complaints of any changes since his last treatment. No complaints noted at this time. Vital signs are within parameters for treatment.   Treatment given today per MD orders. Tolerated infusion without adverse affects. Vital signs stable. No complaints at this time. Discharged from clinic ambulatory in stable condition. Alert and oriented x 3. F/U with [facility] as scheduled.

## 2022-04-02 NOTE — Patient Instructions (Signed)
[facility]  Discharge Instructions: Thank you for choosing [facility] to provide your oncology and hematology care.  If you have a lab appointment with the [facility], please come in thru the Main Entrance and check in at the main information desk.  Wear comfortable clothing and clothing appropriate for easy access to any Portacath or PICC line.   We strive to give you quality time with your provider. You may need to reschedule your appointment if you arrive late (15 or more minutes).  Arriving late affects you and other patients whose appointments are after yours.  Also, if you miss three or more appointments without notifying the office, you may be dismissed from the clinic at the provider's discretion.      For prescription refill requests, have your pharmacy contact our office and allow 72 hours for refills to be completed.    Today you received the following chemotherapy and/or immunotherapy agents Vidaza. Azacitidine Injection What is this medication? AZACITIDINE ([place]) treats blood and bone marrow cancers. It works by slowing down the growth of cancer cells. This medicine may be used for other purposes; ask your health care provider or pharmacist if you have questions. COMMON BRAND NAME(S): Vidaza What should I tell my care team before I take this medication? They need to know if you have any of these conditions: Kidney disease Liver disease Low blood cell levels, such as low white cells, platelets, or red blood cells Low levels of albumin in the blood Low levels of bicarbonate in the blood An unusual or allergic reaction to azacitidine, mannitol, other medications, foods, dyes, or preservatives If you or your partner are pregnant or trying to get pregnant Breast-feeding How should I use this medication? This medication is injected into a vein or under the skin. It is given by your care team in a hospital or clinic setting. Talk  to your care team about the use of this medication in children. While it may be prescribed for children as young as 1 month for selected conditions, precautions do apply. Overdosage: If you think you have taken too much of this medicine contact a poison control center or emergency room at once. NOTE: This medicine is only for you. Do not share this medicine with others. What if I miss a dose? Keep appointments for follow-up doses. It is important not to miss your dose. Call your care team if you are unable to keep an appointment. What may interact with this medication? Interactions are not expected. This list may not describe all possible interactions. Give your health care provider a list of all the medicines, herbs, non-prescription drugs, or dietary supplements you use. Also tell them if you smoke, drink alcohol, or use illegal drugs. Some items may interact with your medicine. What should I watch for while using this medication? Your condition will be monitored carefully while you are receiving this medication. You may need blood work while taking this medication. This medication may make you feel generally unwell. This is not uncommon as chemotherapy can affect healthy cells as well as cancer cells. Report any side effects. Continue your course of treatment even though you feel ill unless your care team tells you to stop. Other product types may be available that contain the medication azacitidine. The injection and oral products should not be used in place of one another. Talk to your care team if you have questions. This medication can cause serious side effects.  To reduce the risk, your care team may give you other medications to take before receiving this one. Be sure to follow the directions from your care team. This medication may increase your risk of getting an infection. Call your care team for advice if you get a fever, chills, sore throat, or other symptoms of a cold or flu. Do not treat  yourself. Try to avoid being around people who are sick. Avoid taking medications that contain aspirin, acetaminophen, ibuprofen, naproxen, or ketoprofen unless instructed by your care team. These medications may hide a fever. This medication may increase your risk to bruise or bleed. Call your care team if you notice any unusual bleeding. Be careful brushing or flossing your teeth or using a toothpick because you may get an infection or bleed more easily. If you have any dental work done, tell your dentist you are receiving this medication. Talk to your care team if you or your partner may be pregnant. Serious birth defects can occur if you take this medication during pregnancy and for 6 months after the last dose. You will need a negative pregnancy test before starting this medication. Contraception is recommended while taking his medication and for 6 months after the last dose. Your care team can help you find the option that works for you. If your partner can get pregnant, use a condom during sex while taking this medication and for 3 months after the last dose. Do not breastfeed while taking this medication and for 1 week after the last dose. This medication may cause infertility. Talk to your care team if you are concerned about your fertility. What side effects may I notice from receiving this medication? Side effects that you should report to your care team as soon as possible: Allergic reactions--skin rash, itching, hives, swelling of the face, lips, tongue, or throat Infection--fever, chills, cough, sore throat, wounds that don't heal, pain or trouble when passing urine, general feeling of discomfort or being unwell Kidney injury--decrease in the amount of urine, swelling of the ankles, hands, or feet Liver injury--right upper belly pain, loss of appetite, nausea, light-colored stool, dark yellow or brown urine, yellowing skin or eyes, unusual weakness or fatigue Low red blood cell  level--unusual weakness or fatigue, dizziness, headache, trouble breathing Tumor lysis syndrome (TLS)--nausea, vomiting, diarrhea, decrease in the amount of urine, dark urine, unusual weakness or fatigue, confusion, muscle pain or cramps, fast or irregular heartbeat, joint pain Unusual bruising or bleeding Side effects that usually do not require medical attention (report to your care team if they continue or are bothersome): Constipation Diarrhea Nausea Pain, redness, or irritation at injection site Vomiting This list may not describe all possible side effects. Call your doctor for medical advice about side effects. You may report side effects to FDA at 1-800-FDA-1088. Where should I keep my medication? This medication is given in a hospital or clinic. It will not be stored at home. NOTE: This sheet is a summary. It may not cover all possible information. If you have questions about this medicine, talk to your doctor, pharmacist, or health care provider.  2023 Elsevier/Gold Standard (2021-07-19 00:00:00)       To help prevent nausea and vomiting after your treatment, we encourage you to take your nausea medication as directed.  BELOW ARE SYMPTOMS THAT SHOULD BE REPORTED IMMEDIATELY: *FEVER GREATER THAN 100.4 F (38 C) OR HIGHER *CHILLS OR SWEATING *NAUSEA AND VOMITING THAT IS NOT CONTROLLED WITH YOUR NAUSEA MEDICATION *UNUSUAL SHORTNESS OF BREATH *UNUSUAL  BRUISING OR BLEEDING *URINARY PROBLEMS (pain or burning when urinating, or frequent urination) *BOWEL PROBLEMS (unusual diarrhea, constipation, pain near the anus) TENDERNESS IN MOUTH AND THROAT WITH OR WITHOUT PRESENCE OF ULCERS (sore throat, sores in mouth, or a toothache) UNUSUAL RASH, SWELLING OR PAIN  UNUSUAL VAGINAL DISCHARGE OR ITCHING   Items with * indicate a potential emergency and should be followed up as soon as possible or go to the Emergency Department if any problems should occur.  Please show the CHEMOTHERAPY ALERT  CARD or IMMUNOTHERAPY ALERT CARD at check-in to the Emergency Department and triage nurse.  Should you have questions after your visit or need to cancel or reschedule your appointment, please contact [facility] [phone]  and follow the prompts.  Office hours are 8:00 a.m. to 4:30 p.m. Monday - Friday. Please note that voicemails left after 4:00 p.m. may not be returned until the following business day.  We are closed weekends and major holidays. You have access to a nurse at all times for urgent questions. Please call the main number to the clinic [phone] and follow the prompts.  For any non-urgent questions, you may also contact your provider using MyChart. We now offer e-Visits for anyone 68 and older to request care online for non-urgent symptoms. For details visit [email or web address].   Also download the MyChart app! Go to the app store, search "MyChart", open the app, select [facility], and log in with your MyChart username and password.

## 2022-04-10 ENCOUNTER — Other Ambulatory Visit: Payer: Self-pay | Admitting: Hematology

## 2022-04-10 ENCOUNTER — Other Ambulatory Visit (HOSPITAL_COMMUNITY): Payer: Self-pay

## 2022-04-10 DIAGNOSIS — D469 Myelodysplastic syndrome, unspecified: Secondary | ICD-10-CM

## 2022-04-10 MED ORDER — VENETOCLAX 100 MG PO TABS
200.0000 mg | ORAL_TABLET | Freq: Every day | ORAL | 0 refills | Status: DC
  Filled 2022-04-10 – 2022-04-17 (×2): qty 28, 28d supply, fill #0

## 2022-04-12 ENCOUNTER — Inpatient Hospital Stay: Payer: Medicare HMO

## 2022-04-12 DIAGNOSIS — D469 Myelodysplastic syndrome, unspecified: Secondary | ICD-10-CM

## 2022-04-12 DIAGNOSIS — Z5111 Encounter for antineoplastic chemotherapy: Secondary | ICD-10-CM | POA: Diagnosis not present

## 2022-04-12 LAB — COMPREHENSIVE METABOLIC PANEL
ALT: 16 U/L (ref 0–44)
AST: 23 U/L (ref 15–41)
Albumin: 3.3 g/dL — ABNORMAL LOW (ref 3.5–5.0)
Alkaline Phosphatase: 65 U/L (ref 38–126)
Anion gap: 8 (ref 5–15)
BUN: 14 mg/dL (ref 8–23)
CO2: 23 mmol/L (ref 22–32)
Calcium: 9.1 mg/dL (ref 8.9–10.3)
Chloride: 107 mmol/L (ref 98–111)
Creatinine, Ser: 0.86 mg/dL (ref 0.61–1.24)
GFR, Estimated: >60 mL/min (ref >60)
Glucose, Bld: 118 mg/dL — ABNORMAL HIGH (ref 70–99)
Potassium: 3.7 mmol/L (ref 3.5–5.1)
Sodium: 138 mmol/L (ref 135–145)
Total Bilirubin: 0.8 mg/dL (ref 0.3–1.2)
Total Protein: 5.6 g/dL — ABNORMAL LOW (ref 6.5–8.1)

## 2022-04-12 LAB — CBC WITH DIFFERENTIAL/PLATELET
Abs Immature Granulocytes: 0.01 10*3/uL (ref 0.00–0.07)
Basophils Absolute: 0 10*3/uL (ref 0.0–0.1)
Basophils Relative: 1 %
Eosinophils Absolute: 0 10*3/uL (ref 0.0–0.5)
Eosinophils Relative: 0 %
HCT: 30.7 % — ABNORMAL LOW (ref 39.0–52.0)
Hemoglobin: 10.1 g/dL — ABNORMAL LOW (ref 13.0–17.0)
Immature Granulocytes: 1 %
Lymphocytes Relative: 42 %
Lymphs Abs: 0.6 10*3/uL — ABNORMAL LOW (ref 0.7–4.0)
MCH: 30.3 pg (ref 26.0–34.0)
MCHC: 32.9 g/dL (ref 30.0–36.0)
MCV: 92.2 fL (ref 80.0–100.0)
Monocytes Absolute: 0.1 10*3/uL (ref 0.1–1.0)
Monocytes Relative: 7 %
Neutro Abs: 0.7 10*3/uL — ABNORMAL LOW (ref 1.7–7.7)
Neutrophils Relative %: 49 %
Platelets: 92 10*3/uL — ABNORMAL LOW (ref 150–400)
RBC: 3.33 MIL/uL — ABNORMAL LOW (ref 4.22–5.81)
RDW: 17.3 % — ABNORMAL HIGH (ref 11.5–15.5)
WBC: 1.5 10*3/uL — ABNORMAL LOW (ref 4.0–10.5)
nRBC: 0 % (ref 0.0–0.2)

## 2022-04-12 MED ORDER — HEPARIN SOD (PORK) LOCK FLUSH 100 UNIT/ML IV SOLN
500.0000 [IU] | Freq: Once | INTRAVENOUS | Status: AC
  Administered 2022-04-12: 500 [IU] via INTRAVENOUS

## 2022-04-12 MED ORDER — SODIUM CHLORIDE 0.9% FLUSH
10.0000 mL | INTRAVENOUS | Status: DC | PRN
  Administered 2022-04-12: 10 mL via INTRAVENOUS

## 2022-04-12 NOTE — Progress Notes (Signed)
Chad Lamb presented for Portacath access and flush. Portacath located right chest wall accessed with  H 20 needle. Good blood return present.  Portacath flushed with 86m NS and 500U/580mHeparin and needle removed intact. Procedure without incident. Patient tolerated procedure well.  Not able to get enough blood for labs today. Had to stick peripherally.

## 2022-04-16 ENCOUNTER — Other Ambulatory Visit (HOSPITAL_COMMUNITY): Payer: Self-pay

## 2022-04-17 ENCOUNTER — Other Ambulatory Visit (HOSPITAL_COMMUNITY): Payer: Self-pay

## 2022-04-17 ENCOUNTER — Other Ambulatory Visit: Payer: Self-pay

## 2022-04-19 ENCOUNTER — Other Ambulatory Visit: Payer: Self-pay

## 2022-04-22 ENCOUNTER — Inpatient Hospital Stay: Payer: Medicare HMO | Attending: Hematology

## 2022-04-22 ENCOUNTER — Inpatient Hospital Stay: Payer: Medicare HMO

## 2022-04-22 ENCOUNTER — Inpatient Hospital Stay (HOSPITAL_BASED_OUTPATIENT_CLINIC_OR_DEPARTMENT_OTHER): Payer: Medicare HMO | Admitting: Hematology

## 2022-04-22 DIAGNOSIS — D469 Myelodysplastic syndrome, unspecified: Secondary | ICD-10-CM

## 2022-04-22 DIAGNOSIS — D4622 Refractory anemia with excess of blasts 2: Secondary | ICD-10-CM | POA: Diagnosis not present

## 2022-04-22 DIAGNOSIS — Z5111 Encounter for antineoplastic chemotherapy: Secondary | ICD-10-CM | POA: Diagnosis present

## 2022-04-22 DIAGNOSIS — T451X5A Adverse effect of antineoplastic and immunosuppressive drugs, initial encounter: Secondary | ICD-10-CM | POA: Diagnosis not present

## 2022-04-22 DIAGNOSIS — Z95828 Presence of other vascular implants and grafts: Secondary | ICD-10-CM

## 2022-04-22 DIAGNOSIS — D701 Agranulocytosis secondary to cancer chemotherapy: Secondary | ICD-10-CM | POA: Insufficient documentation

## 2022-04-22 LAB — CBC WITH DIFFERENTIAL/PLATELET
Abs Immature Granulocytes: 0 10*3/uL (ref 0.00–0.07)
Basophils Absolute: 0 10*3/uL (ref 0.0–0.1)
Basophils Relative: 1 %
Eosinophils Absolute: 0 10*3/uL (ref 0.0–0.5)
Eosinophils Relative: 2 %
HCT: 33 % — ABNORMAL LOW (ref 39.0–52.0)
Hemoglobin: 10.8 g/dL — ABNORMAL LOW (ref 13.0–17.0)
Immature Granulocytes: 0 %
Lymphocytes Relative: 58 %
Lymphs Abs: 0.8 10*3/uL (ref 0.7–4.0)
MCH: 29.8 pg (ref 26.0–34.0)
MCHC: 32.7 g/dL (ref 30.0–36.0)
MCV: 91.2 fL (ref 80.0–100.0)
Monocytes Absolute: 0.1 10*3/uL (ref 0.1–1.0)
Monocytes Relative: 6 %
Neutro Abs: 0.5 10*3/uL — ABNORMAL LOW (ref 1.7–7.7)
Neutrophils Relative %: 33 %
Platelets: 278 10*3/uL (ref 150–400)
RBC: 3.62 MIL/uL — ABNORMAL LOW (ref 4.22–5.81)
RDW: 16.4 % — ABNORMAL HIGH (ref 11.5–15.5)
WBC: 1.5 10*3/uL — ABNORMAL LOW (ref 4.0–10.5)
nRBC: 0 % (ref 0.0–0.2)

## 2022-04-22 LAB — BASIC METABOLIC PANEL
Anion gap: 9 (ref 5–15)
BUN: 15 mg/dL (ref 8–23)
CO2: 25 mmol/L (ref 22–32)
Calcium: 9.2 mg/dL (ref 8.9–10.3)
Chloride: 105 mmol/L (ref 98–111)
Creatinine, Ser: 0.88 mg/dL (ref 0.61–1.24)
GFR, Estimated: >60 mL/min (ref >60)
Glucose, Bld: 122 mg/dL — ABNORMAL HIGH (ref 70–99)
Potassium: 3.6 mmol/L (ref 3.5–5.1)
Sodium: 139 mmol/L (ref 135–145)

## 2022-04-22 LAB — MAGNESIUM: Magnesium: 1.9 mg/dL (ref 1.7–2.4)

## 2022-04-22 MED ORDER — SODIUM CHLORIDE 0.9% FLUSH
10.0000 mL | Freq: Once | INTRAVENOUS | Status: AC
  Administered 2022-04-22: 10 mL via INTRAVENOUS

## 2022-04-22 MED ORDER — HEPARIN SOD (PORK) LOCK FLUSH 100 UNIT/ML IV SOLN
500.0000 [IU] | Freq: Once | INTRAVENOUS | Status: AC
  Administered 2022-04-22: 500 [IU] via INTRAVENOUS

## 2022-04-22 MED ORDER — SODIUM CHLORIDE 0.9% FLUSH
10.0000 mL | INTRAVENOUS | Status: DC | PRN
  Administered 2022-04-22: 10 mL via INTRAVENOUS

## 2022-04-22 NOTE — Progress Notes (Signed)
Patient seen by MD today prior to arriving to infusion area, labs reviewed, treatment held today per MD. Will follow up as scheduled.

## 2022-04-22 NOTE — Progress Notes (Signed)
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  INTERVAL HISTORY:  [name], a 77 y.o. male, seen for follow-up of MDS and toxicity assessment prior to cycle 10 of azacitidine. He was last seen by me on 03/25/22.  Today, he states that he is doing very well overall. He states that he did better after his most recent treatment. He denies any significant toxicities and he is tolerating his treatment well. His appetite level is at 100%. His energy level is at 75%. He denies any bleeding problems.  His constipation is well controlled with Lactulose which he has needed only 3 times.  He states that he was started on an antibiotic prior to a dental surgery on x1 week ago. He states that he is recovering well from his surgery. He has a follow up surgery in x3-4 months for staged procedure. He is scheduled to see his oral surgeon next week for postop follow up and suture removal.   REVIEW OF SYSTEMS:  Review of Systems  Constitutional:  Negative for chills, fatigue and fever.  HENT:   Negative for lump/mass, mouth sores, nosebleeds and sore throat.        + difficulty chewing due to implant  Respiratory:  Negative for cough and  shortness of breath.   Cardiovascular:  Negative for chest pain, leg swelling and palpitations.  Gastrointestinal:  Negative for abdominal pain, constipation, diarrhea, nausea and vomiting.  Genitourinary:  Negative for bladder incontinence, difficulty urinating, dysuria, frequency, hematuria and nocturia.   Musculoskeletal:  Negative for arthralgias, back pain, flank pain, myalgias and neck pain.  Skin:  Negative for itching and rash.  Neurological:  Negative for dizziness, headaches and numbness.  Hematological:  Does not bruise/bleed easily.  Psychiatric/Behavioral:  Positive for depression. Negative for sleep disturbance and suicidal ideas. The patient is nervous/anxious.   All other systems reviewed and are negative.   PAST MEDICAL/SURGICAL HISTORY:  Past Medical History:  Diagnosis Date   Allergy    Anxiety    Arthritis    Asthma    Cataract    Clotting disorder ([facility])    DVT (deep venous thrombosis) ([facility])    unknown etiology. 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[facility]) 06/17/2021   Pneumonia    Port-A-Cath in place 07/30/2021   Past Surgical History:  Procedure Laterality Date   CATARACT EXTRACTION W/PHACO Right 11/24/2017   Procedure: CATARACT EXTRACTION PHACO AND INTRAOCULAR LENS PLACEMENT RIGHT EYE;  Surgeon: [name], MD;  Location: [facility];  Service: Ophthalmology;  Laterality: Right;  CDE: 8.61   CATARACT EXTRACTION W/PHACO Left 12/29/2017   Procedure: CATARACT EXTRACTION PHACO AND INTRAOCULAR LENS PLACEMENT (IOC);  Surgeon: [name], MD;  Location: [facility];  Service: Ophthalmology;  Laterality: Left;  CDE: 7.21   GANGLION CYST EXCISION     HERNIA REPAIR     bilateral   PARS PLANA VITRECTOMY Left 05/17/2021   Procedure: PARS PLANA VITRECTOMY 25 GAUGE WITH ANTIBIOTIC INJECTION  AND ENDOLASER LEFT EYE;  Surgeon: [name], MD;   Location: [facility];  Service: Ophthalmology;  Laterality: Left;   PORTACATH PLACEMENT Right 07/09/2021   Procedure: INSERTION PORT-A-CATH;  Surgeon: Aviva Signs, MD;  Location: [facility];  Service: General;  Laterality: Right;   TONSILLECTOMY     VASCULAR SURGERY      SOCIAL HISTORY:  Social History   Socioeconomic History   Marital status: Married    Spouse name: [name]   Number of children: 0   Years of education: 13   Highest education level: High school graduate  Occupational History   Not on file  Tobacco Use   Smoking status: Former    Types: Pipe    Quit date: 03/21/1966    Years since quitting: 56.1   Smokeless tobacco: Never  Vaping Use   Vaping Use: Never used  Substance and Sexual Activity   Alcohol use: Yes    Alcohol/week: 1.0 standard drink of alcohol    Types: 1 Cans of beer per week    Comment: 1 can of beer a week   Drug use: Never   Sexual activity: Not Currently  Other Topics Concern   Not on file  Social History Narrative   ** Merged History Encounter **       Grew up in [place], [place] and [place].  Retired.  Worked in [place]. 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    Social Determinants of Health   Financial Resource Strain: Low Risk  (01/23/2017)   Overall Financial Resource Strain (CARDIA)    Difficulty of Paying Living Expenses: Not hard at all  Food Insecurity: No Food Insecurity (01/23/2017)   Hunger Vital Sign    Worried About Running Out of Food in the Last Year: Never true    Ran Out of Food in the Last Year: Never true  Transportation Needs: No Transportation Needs (01/23/2017)   PRAPARE - Hydrologist (Medical): No    Lack of Transportation (Non-Medical): No  Physical Activity: Unknown (01/23/2017)   Exercise Vital Sign    Days of Exercise per Week: 3 days    Minutes of Exercise per Session: Not on file  Stress: Stress Concern Present (01/23/2017)   [place]    Feeling of Stress : To some extent  Social Connections: Somewhat Isolated (01/23/2017)   Social Connection and Isolation Panel [NHANES]    Frequency of Communication with Friends and Family: More than three times a week    Frequency of Social Gatherings with Friends and Family: More than three times a week    Attends Religious Services: Never    Marine scientist or Organizations: No    Attends Archivist Meetings: Never    Marital Status: Married  Human resources officer Violence: Not At Risk (01/23/2017)   Humiliation, Afraid, Rape, and Kick questionnaire    Fear of Current or Ex-Partner: No    Emotionally Abused: No  Physically Abused: No    Sexually Abused: No    FAMILY HISTORY:  Family History  Problem Relation Age of Onset   Cancer Mother        pancreatic   Arthritis Father    Early death Father 39       Lung infiltrate   Colon cancer Neg Hx    Colon polyps Neg Hx     CURRENT MEDICATIONS:  Current Outpatient Medications  Medication Sig Dispense Refill   albuterol (PROVENTIL HFA;VENTOLIN HFA) 108 (90 Base) MCG/ACT inhaler Inhale 1-2 puffs into the lungs every 6 (six) hours as needed for wheezing or shortness of breath.     ALPRAZolam (XANAX) 0.5 MG tablet Take 0.5 mg by mouth 2 (two) times daily as needed for anxiety.     amLODipine (NORVASC) 2.5 MG tablet Take 2.5 mg by mouth daily.     azaCITIDine 5 mg/2 mLs in lactated ringers infusion Inject into the vein daily. Days 1-7 every 28 days     DENTA 5000 PLUS 1.1 % CREA dental cream Take by mouth.     fluticasone furoate-vilanterol (BREO ELLIPTA) 200-25 MCG/ACT AEPB Inhale 1 puff into the lungs daily.     Lactulose 20 GM/30ML SOLN Take 30 mLs (20 g total) by mouth at bedtime. 473 mL 3   Lidocaine-Hydrocort, Perianal, 3-0.5 % CREA Apply topically 2 (two) times daily as needed.     Lidocaine-Hydrocortisone Ace 3-0.5 % CREA Apply 1 application  topically as directed. (Patient taking differently: Apply 1 application  topically 2 (two) times daily as needed (hemorrhoids).) 30 Tube 11   loratadine (CLARITIN) 10 MG tablet Take 10 mg by mouth daily.     Lutein 40 MG CAPS Take 40 mg by mouth daily.     Multiple Vitamins-Minerals (CENTRUM ADULTS PO) Take 1 tablet by mouth daily.     nystatin cream (MYCOSTATIN) Apply 1 application. topically 2 (two) times daily as needed for dry skin.     ofloxacin (OCUFLOX) 0.3 % ophthalmic solution Place 1 drop into the left eye See admin instructions. 1 drop to left eye 4 x daily for 7 days after monthly procedure.     omeprazole (PRILOSEC OTC) 20 MG tablet Take 5-10 mg by mouth daily.     sildenafil (VIAGRA) 50 MG tablet Take 50 mg by mouth daily as needed for erectile dysfunction.     traMADol (ULTRAM) 50 MG tablet Take 1 tablet (50 mg total) by mouth every 6 (six) hours as needed. 15 tablet 0   venetoclax (VENCLEXTA) 100 MG tablet Take 2 tablets (200 mg total) by mouth daily. Take for 14 days, then hold for 14 days. Repeat every 28 days. Take with a meal and a full glass of water. 28 tablet 0   lidocaine-prilocaine (EMLA) cream Apply topically as needed. Apply to port site prior to infusion as needed (Patient not taking: Reported on 04/22/2022) 30 g 60   No current facility-administered medications for this visit.    ALLERGIES:  Allergies  Allergen Reactions   Cephalosporins Itching   Amoxapine And Related Itching and Rash    Has patient had a PCN reaction causing immediate rash, facial/tongue/throat swelling, SOB or lightheadedness with hypotension: No Has patient had a PCN reaction causing severe rash involving mucus membranes or skin necrosis: No Has patient had a PCN reaction that required hospitalization: No Has patient had a PCN reaction occurring within the last 10 years: No If all of the above answers are "NO", then may proceed  with Cephalosporin use.     Amoxicillin Rash   Cephalexin Rash    Clindamycin/Lincomycin Itching and Rash   Sulfamethoxazole Itching and Rash   Trimethoprim Rash    PHYSICAL EXAM:  Performance status (ECOG): 1 - Symptomatic but completely ambulatory  There were no vitals filed for this visit. Wt Readings from Last 3 Encounters:  03/25/22 68.8 kg (151 lb 9.6 oz)  03/04/22 68.3 kg (150 lb 8 oz)  02/18/22 69.1 kg (152 lb 6.4 oz)   Physical Exam Vitals and nursing note reviewed. Exam conducted with a chaperone present.  Constitutional:      Appearance: Normal appearance.  Cardiovascular:     Rate and Rhythm: Normal rate and regular rhythm.     Pulses: Normal pulses.     Heart sounds: Normal heart sounds.  Pulmonary:     Effort: Pulmonary effort is normal.     Breath sounds: Normal breath sounds.  Abdominal:     Palpations: Abdomen is soft. There is no hepatomegaly, splenomegaly or mass.     Tenderness: There is no abdominal tenderness.  Musculoskeletal:     Right lower leg: No edema.     Left lower leg: No edema.  Lymphadenopathy:     Cervical: No cervical adenopathy.     Right cervical: No superficial cervical adenopathy.    Left cervical: No superficial cervical adenopathy.  Neurological:     General: No focal deficit present.     Mental Status: He is alert and oriented to person, place, and time.  Psychiatric:        Mood and Affect: Mood normal.        Behavior: Behavior normal.     LABORATORY DATA:  I have reviewed the labs as listed.     Latest Ref Rng & Units 04/22/2022   10:39 AM 04/12/2022   10:10 AM 03/25/2022    9:39 AM  CBC  WBC 4.0 - 10.5 K/uL 1.5  1.5  2.5   Hemoglobin 13.0 - 17.0 g/dL 10.8  10.1  10.5   Hematocrit 39.0 - 52.0 % 33.0  30.7  32.2   Platelets 150 - 400 K/uL 278  92  188       Latest Ref Rng & Units 04/22/2022   10:39 AM 04/12/2022   10:10 AM 03/25/2022    9:39 AM  CMP  Glucose 70 - 99 mg/dL 122  118  102   BUN 8 - 23 mg/dL '15  14  13   '$ Creatinine 0.61 - 1.24 mg/dL 0.88  0.86  0.84   Sodium 135 -  145 mmol/L 139  138  137   Potassium 3.5 - 5.1 mmol/L 3.6  3.7  3.6   Chloride 98 - 111 mmol/L 105  107  104   CO2 22 - 32 mmol/L '25  23  25   '$ Calcium 8.9 - 10.3 mg/dL 9.2  9.1  8.8   Total Protein 6.5 - 8.1 g/dL  5.6  6.0   Total Bilirubin 0.3 - 1.2 mg/dL  0.8  1.1   Alkaline Phos 38 - 126 U/L  65  69   AST 15 - 41 U/L  23  16   ALT 0 - 44 U/L  16  13     DIAGNOSTIC IMAGING:  I have independently reviewed the scans and discussed with the patient. No results found.   ASSESSMENT:  MDS with EB 2: - Seen at the request of Dr. Mannie Stabile for abnormal CBC. - Labs on  12/06/2020 shows white count 3.0 with 41% neutrophils, 41% lymphocytes, 13.8% monocytes.  ANC was 1.2.  Hemoglobin 13.4 and normal.  MCV was slightly high at 96.  Platelet count was 128.  Vitamin H68 and folic acid were normal. - He took antibiotics for teeth implants few times this year, last 1 on 11/16/2020.  He does not know the antibiotic name.  He also reports taking over-the-counter pill for memory for 5 to 6 months, which was stopped around September 2022. - Labs from 07/07/2020 with creatinine 0.92.  White count 3.3 (37% neutrophils, 45% lymphocytes, 14% monocytes, 3% eosinophils), ANC 1.2.  Platelet count 149. - His prior CBC from 12/08/2017 in our system was completely normal. - Reports slight worsening of the night sweats in the last 3 months.  At least once per week and has to change clothes.  No fevers or weight loss. - BMBX on 06/04/2021: Hypercellular marrow with dyspoietic changes involving the granulocytic cell line and megakaryocytes.  Myeloblasts 8% on aspirate smears and 10% by flow.  Cytogenetics 15, XY (20).  MDS FISH panel normal. 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 - IPSS-M score of 0.12, moderate high risk.  Leukemia free survival is 2.3 years.  Overall survival 2.8 years. - Cycle 1 of azacitidine on 07/30/2021.  BMBX (01/04/2022): Overall improvement in blast count and cellularity.  Chromosome analysis and FISH panel  normal.  Venetoclax 200 mg day 1 through 14 added with cycle 8 on 02/18/2022.   Social/family history: - He lives at home with his wife.  He swims about 40 minutes daily. 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  Non-smoker. - Paternal aunt had breast cancer and mother had pancreatic cancer.  3.  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 - He underwent thrombolytic therapy at [facility]. - He will followed up with vascular at [facility] and has been on Xarelto since then.   PLAN:  MDS with EB 2: - He has tolerated last cycle very well. - She had dental implant surgery on 04/15/2022.  He was placed on antibiotics which she completed last dose this morning. - Labs reviewed today showed white count is 1.5 with ANC of 0.5.  Platelet count is normal. - I have recommended that we hold his treatment today due to neutropenia.  We will check his CBC with differential next week.  If it comes back to his baseline of around [phone], he will proceed with next cycle.  He will continue venetoclax 200 mg daily for 2 weeks.  I will arrange for bone marrow biopsy after this cycle.  I will see him back in 5 weeks for follow-up.   Unprovoked left subclavian DVT: - Xarelto was discontinued due to hemorrhoidal bleeding.  No evidence of recurrence.  3.  Constipation: - Continue Colace and stimulant once daily and MiraLAX.  Use lactulose as needed.   Orders placed this encounter:  No orders of the defined types were placed in this encounter.   I,Alexis Herring,acting as a Education administrator for [employer], MD.,have documented all relevant documentation on the behalf of [name], MD,as directed by  [name], MD while in the presence of [name], MD.  I, [name] MD, have reviewed the above documentation for accuracy and completeness, and I agree with the  above.   [name], MD [place] [phone]

## 2022-04-22 NOTE — Addendum Note (Signed)
Addended by: Henreitta Leber E on: 04/22/2022 02:01 PM   Modules accepted: Orders

## 2022-04-22 NOTE — Patient Instructions (Signed)
[facility]  Discharge Instructions  You were seen and examined today by Dr. Delton Coombes.  Your white count is too low for treatment, we will delay treatment by 1 week.  Follow-up as scheduled.  Thank you for choosing [facility] to provide your oncology and hematology care.   To afford each patient quality time with our provider, please arrive at least 15 minutes before your scheduled appointment time. You may need to reschedule your appointment if you arrive late (10 or more minutes). Arriving late affects you and other patients whose appointments are after yours.  Also, if you miss three or more appointments without notifying the office, you may be dismissed from the clinic at the provider's discretion.    Again, thank you for choosing [facility].  Our hope is that these requests will decrease the amount of time that you wait before being seen by our physicians.   If you have a lab appointment with the [facility] please come in thru the Main Entrance and check in at the main information desk.           _____________________________________________________________  Should you have questions after your visit to [facility], please contact our office at [phone] and follow the prompts.  Our office hours are 8:00 a.m. to 4:30 p.m. Monday - Thursday and 8:00 a.m. to 2:30 p.m. Friday.  Please note that voicemails left after 4:00 p.m. may not be returned until the following business day.  We are closed weekends and all major holidays.  You do have access to a nurse 24-7, just call the main number to the clinic [phone] and do not press any options, hold on the line and a nurse will answer the phone.    For prescription refill requests, have your pharmacy contact our office and allow 72 hours.    Masks are optional in the cancer centers. If you would like for your care team to wear a mask while they are  taking care of you, please let them know. You may have one support person who is at least 78 years old accompany you for your appointments.

## 2022-04-22 NOTE — Progress Notes (Signed)
Patients port flushed without difficulty.  Good blood return noted with no bruising or swelling noted at site.  Patient remains accessed for chemotherapy treatment.  

## 2022-04-23 ENCOUNTER — Inpatient Hospital Stay: Payer: Medicare HMO

## 2022-04-24 ENCOUNTER — Inpatient Hospital Stay: Payer: Medicare HMO

## 2022-04-25 ENCOUNTER — Inpatient Hospital Stay: Payer: Medicare HMO

## 2022-04-26 ENCOUNTER — Inpatient Hospital Stay: Payer: Medicare HMO

## 2022-04-29 ENCOUNTER — Inpatient Hospital Stay: Payer: Medicare HMO

## 2022-04-29 VITALS — BP 130/81 | HR 60 | Temp 97.8°F | Resp 18

## 2022-04-29 DIAGNOSIS — Z5111 Encounter for antineoplastic chemotherapy: Secondary | ICD-10-CM | POA: Diagnosis not present

## 2022-04-29 DIAGNOSIS — Z95828 Presence of other vascular implants and grafts: Secondary | ICD-10-CM

## 2022-04-29 DIAGNOSIS — D46Z Other myelodysplastic syndromes: Secondary | ICD-10-CM

## 2022-04-29 DIAGNOSIS — D469 Myelodysplastic syndrome, unspecified: Secondary | ICD-10-CM

## 2022-04-29 LAB — CBC WITH DIFFERENTIAL/PLATELET
Abs Immature Granulocytes: 0 10*3/uL (ref 0.00–0.07)
Basophils Absolute: 0 10*3/uL (ref 0.0–0.1)
Basophils Relative: 2 %
Eosinophils Absolute: 0.1 10*3/uL (ref 0.0–0.5)
Eosinophils Relative: 6 %
HCT: 34 % — ABNORMAL LOW (ref 39.0–52.0)
Hemoglobin: 11 g/dL — ABNORMAL LOW (ref 13.0–17.0)
Immature Granulocytes: 0 %
Lymphocytes Relative: 45 %
Lymphs Abs: 0.9 10*3/uL (ref 0.7–4.0)
MCH: 29.3 pg (ref 26.0–34.0)
MCHC: 32.4 g/dL (ref 30.0–36.0)
MCV: 90.4 fL (ref 80.0–100.0)
Monocytes Absolute: 0.4 10*3/uL (ref 0.1–1.0)
Monocytes Relative: 19 %
Neutro Abs: 0.6 10*3/uL — ABNORMAL LOW (ref 1.7–7.7)
Neutrophils Relative %: 28 %
Platelets: 279 10*3/uL (ref 150–400)
RBC: 3.76 MIL/uL — ABNORMAL LOW (ref 4.22–5.81)
RDW: 16.1 % — ABNORMAL HIGH (ref 11.5–15.5)
WBC: 2 10*3/uL — ABNORMAL LOW (ref 4.0–10.5)
nRBC: 0 % (ref 0.0–0.2)

## 2022-04-29 LAB — BASIC METABOLIC PANEL
Anion gap: 8 (ref 5–15)
BUN: 15 mg/dL (ref 8–23)
CO2: 25 mmol/L (ref 22–32)
Calcium: 9.5 mg/dL (ref 8.9–10.3)
Chloride: 106 mmol/L (ref 98–111)
Creatinine, Ser: 0.86 mg/dL (ref 0.61–1.24)
GFR, Estimated: >60 mL/min (ref >60)
Glucose, Bld: 110 mg/dL — ABNORMAL HIGH (ref 70–99)
Potassium: 3.8 mmol/L (ref 3.5–5.1)
Sodium: 139 mmol/L (ref 135–145)

## 2022-04-29 LAB — HEPATIC FUNCTION PANEL
ALT: 15 U/L (ref 0–44)
AST: 23 U/L (ref 15–41)
Albumin: 3.8 g/dL (ref 3.5–5.0)
Alkaline Phosphatase: 66 U/L (ref 38–126)
Bilirubin, Direct: 0.1 mg/dL (ref 0.0–0.2)
Indirect Bilirubin: 0.9 mg/dL (ref 0.3–0.9)
Total Bilirubin: 1 mg/dL (ref 0.3–1.2)
Total Protein: 6.2 g/dL — ABNORMAL LOW (ref 6.5–8.1)

## 2022-04-29 LAB — MAGNESIUM: Magnesium: 2.2 mg/dL (ref 1.7–2.4)

## 2022-04-29 MED ORDER — PALONOSETRON HCL INJECTION 0.25 MG/5ML
0.2500 mg | Freq: Once | INTRAVENOUS | Status: AC
  Administered 2022-04-29: 0.25 mg via INTRAVENOUS
  Filled 2022-04-29: qty 5

## 2022-04-29 MED ORDER — SODIUM CHLORIDE 0.9 % IV SOLN: Freq: Once | INTRAVENOUS | Status: AC

## 2022-04-29 MED ORDER — SODIUM CHLORIDE 0.9% FLUSH
10.0000 mL | INTRAVENOUS | Status: DC | PRN
  Administered 2022-04-29: 10 mL

## 2022-04-29 MED ORDER — SODIUM CHLORIDE 0.9 % IV SOLN
10.0000 mg | Freq: Once | INTRAVENOUS | Status: AC
  Administered 2022-04-29: 10 mg via INTRAVENOUS
  Filled 2022-04-29: qty 10

## 2022-04-29 MED ORDER — SODIUM CHLORIDE 0.9 % IV SOLN
75.0000 mg/m2 | Freq: Once | INTRAVENOUS | Status: AC
  Administered 2022-04-29: 140 mg via INTRAVENOUS
  Filled 2022-04-29: qty 14

## 2022-04-29 MED ORDER — HEPARIN SOD (PORK) LOCK FLUSH 100 UNIT/ML IV SOLN
500.0000 [IU] | Freq: Once | INTRAVENOUS | Status: AC | PRN
  Administered 2022-04-29: 500 [IU]

## 2022-04-29 NOTE — Progress Notes (Signed)
Patient presents today for D1 V[place]aza treatment. Vital signs within parameters for treatment. ANC 0.6 and reported to Dr. Delton Coombes via secure chat.   Verbal orders received from Dr. Delton Coombes / A. Anderson RN to proceed with treatment. MD aware of ANC 0.6. Hepatic function panel added to the BMP by A.Beckie Salts.   Treatment given today per MD orders. Tolerated infusion without adverse affects. Vital signs stable. No complaints at this time. Discharged from clinic ambulatory in stable condition. Alert and oriented x 3. F/U with [facility] as scheduled.

## 2022-04-29 NOTE — Patient Instructions (Signed)
[facility] [place]  Discharge Instructions: Thank you for choosing [facility] to provide your oncology and hematology care.  If you have a lab appointment with the Cancer Center, please come in thru the Main Entrance and check in at the main information desk.  Wear comfortable clothing and clothing appropriate for easy access to any Portacath or PICC line.   We strive to give you quality time with your provider. You may need to reschedule your appointment if you arrive late (15 or more minutes).  Arriving late affects you and other patients whose appointments are after yours.  Also, if you miss three or more appointments without notifying the office, you may be dismissed from the clinic at the provider's discretion.      For prescription refill requests, have your pharmacy contact our office and allow 72 hours for refills to be completed.    Today you received the following chemotherapy and/or immunotherapy agents Vidaza. Azacitidine Injection What is this medication? AZACITIDINE (ay za SITE i deen) treats blood and bone marrow cancers. It works by slowing down the growth of cancer cells. This medicine may be used for other purposes; ask your health care provider or pharmacist if you have questions. COMMON BRAND NAME(S): Vidaza What should I tell my care team before I take this medication? They need to know if you have any of these conditions: Kidney disease Liver disease Low blood cell levels, such as low white cells, platelets, or red blood cells Low levels of albumin in the blood Low levels of bicarbonate in the blood An unusual or allergic reaction to azacitidine, mannitol, other medications, foods, dyes, or preservatives If you or your partner are pregnant or trying to get pregnant Breast-feeding How should I use this medication? This medication is injected into a vein or under the skin. It is given by your care team in a hospital or clinic setting. Talk  to your care team about the use of this medication in children. While it may be prescribed for children as young as 1 month for selected conditions, precautions do apply. Overdosage: If you think you have taken too much of this medicine contact a poison control center or emergency room at once. NOTE: This medicine is only for you. Do not share this medicine with others. What if I miss a dose? Keep appointments for follow-up doses. It is important not to miss your dose. Call your care team if you are unable to keep an appointment. What may interact with this medication? Interactions are not expected. This list may not describe all possible interactions. Give your health care provider a list of all the medicines, herbs, non-prescription drugs, or dietary supplements you use. Also tell them if you smoke, drink alcohol, or use illegal drugs. Some items may interact with your medicine. What should I watch for while using this medication? Your condition will be monitored carefully while you are receiving this medication. You may need blood work while taking this medication. This medication may make you feel generally unwell. This is not uncommon as chemotherapy can affect healthy cells as well as cancer cells. Report any side effects. Continue your course of treatment even though you feel ill unless your care team tells you to stop. Other product types may be available that contain the medication azacitidine. The injection and oral products should not be used in place of one another. Talk to your care team if you have questions. This medication can cause serious side effects.   To reduce the risk, your care team may give you other medications to take before receiving this one. Be sure to follow the directions from your care team. This medication may increase your risk of getting an infection. Call your care team for advice if you get a fever, chills, sore throat, or other symptoms of a cold or flu. Do not treat  yourself. Try to avoid being around people who are sick. Avoid taking medications that contain aspirin, acetaminophen, ibuprofen, naproxen, or ketoprofen unless instructed by your care team. These medications may hide a fever. This medication may increase your risk to bruise or bleed. Call your care team if you notice any unusual bleeding. Be careful brushing or flossing your teeth or using a toothpick because you may get an infection or bleed more easily. If you have any dental work done, tell your dentist you are receiving this medication. Talk to your care team if you or your partner may be pregnant. Serious birth defects can occur if you take this medication during pregnancy and for 6 months after the last dose. You will need a negative pregnancy test before starting this medication. Contraception is recommended while taking his medication and for 6 months after the last dose. Your care team can help you find the option that works for you. If your partner can get pregnant, use a condom during sex while taking this medication and for 3 months after the last dose. Do not breastfeed while taking this medication and for 1 week after the last dose. This medication may cause infertility. Talk to your care team if you are concerned about your fertility. What side effects may I notice from receiving this medication? Side effects that you should report to your care team as soon as possible: Allergic reactions--skin rash, itching, hives, swelling of the face, lips, tongue, or throat Infection--fever, chills, cough, sore throat, wounds that don't heal, pain or trouble when passing urine, general feeling of discomfort or being unwell Kidney injury--decrease in the amount of urine, swelling of the ankles, hands, or feet Liver injury--right upper belly pain, loss of appetite, nausea, light-colored stool, dark yellow or brown urine, yellowing skin or eyes, unusual weakness or fatigue Low red blood cell  level--unusual weakness or fatigue, dizziness, headache, trouble breathing Tumor lysis syndrome (TLS)--nausea, vomiting, diarrhea, decrease in the amount of urine, dark urine, unusual weakness or fatigue, confusion, muscle pain or cramps, fast or irregular heartbeat, joint pain Unusual bruising or bleeding Side effects that usually do not require medical attention (report to your care team if they continue or are bothersome): Constipation Diarrhea Nausea Pain, redness, or irritation at injection site Vomiting This list may not describe all possible side effects. Call your doctor for medical advice about side effects. You may report side effects to FDA at 1-800-FDA-1088. Where should I keep my medication? This medication is given in a hospital or clinic. It will not be stored at home. NOTE: This sheet is a summary. It may not cover all possible information. If you have questions about this medicine, talk to your doctor, pharmacist, or health care provider.  2023 Elsevier/Gold Standard (2021-07-19 00:00:00)       To help prevent nausea and vomiting after your treatment, we encourage you to take your nausea medication as directed.  BELOW ARE SYMPTOMS THAT SHOULD BE REPORTED IMMEDIATELY: *FEVER GREATER THAN 100.4 F (38 C) OR HIGHER *CHILLS OR SWEATING *NAUSEA AND VOMITING THAT IS NOT CONTROLLED WITH YOUR NAUSEA MEDICATION *UNUSUAL SHORTNESS OF BREATH *UNUSUAL   BRUISING OR BLEEDING *URINARY PROBLEMS (pain or burning when urinating, or frequent urination) *BOWEL PROBLEMS (unusual diarrhea, constipation, pain near the anus) TENDERNESS IN MOUTH AND THROAT WITH OR WITHOUT PRESENCE OF ULCERS (sore throat, sores in mouth, or a toothache) UNUSUAL RASH, SWELLING OR PAIN  UNUSUAL VAGINAL DISCHARGE OR ITCHING   Items with * indicate a potential emergency and should be followed up as soon as possible or go to the Emergency Department if any problems should occur.  Please show the CHEMOTHERAPY ALERT  CARD or IMMUNOTHERAPY ALERT CARD at check-in to the Emergency Department and triage nurse.  Should you have questions after your visit or need to cancel or reschedule your appointment, please contact [facility] [phone]  and follow the prompts.  Office hours are 8:00 a.m. to 4:30 p.m. Monday - Friday. Please note that voicemails left after 4:00 p.m. may not be returned until the following business day.  We are closed weekends and major holidays. You have access to a nurse at all times for urgent questions. Please call the main number to the clinic [phone] and follow the prompts.  For any non-urgent questions, you may also contact your provider using MyChart. We now offer e-Visits for anyone 18 and older to request care online for non-urgent symptoms. For details visit [email or web address].   Also download the MyChart app! Go to the app store, search "MyChart", open the app, select [place], and log in with your MyChart username and password.   

## 2022-04-30 ENCOUNTER — Inpatient Hospital Stay: Payer: Medicare HMO

## 2022-04-30 VITALS — BP 117/76 | HR 65 | Temp 98.3°F | Resp 18

## 2022-04-30 DIAGNOSIS — Z95828 Presence of other vascular implants and grafts: Secondary | ICD-10-CM

## 2022-04-30 DIAGNOSIS — Z5111 Encounter for antineoplastic chemotherapy: Secondary | ICD-10-CM | POA: Diagnosis not present

## 2022-04-30 DIAGNOSIS — D469 Myelodysplastic syndrome, unspecified: Secondary | ICD-10-CM

## 2022-04-30 MED ORDER — SODIUM CHLORIDE 0.9 % IV SOLN: Freq: Once | INTRAVENOUS | Status: AC

## 2022-04-30 MED ORDER — SODIUM CHLORIDE 0.9 % IV SOLN
10.0000 mg | Freq: Once | INTRAVENOUS | Status: AC
  Administered 2022-04-30: 10 mg via INTRAVENOUS
  Filled 2022-04-30: qty 10

## 2022-04-30 MED ORDER — SODIUM CHLORIDE 0.9% FLUSH
10.0000 mL | INTRAVENOUS | Status: DC | PRN
  Administered 2022-04-30: 10 mL

## 2022-04-30 MED ORDER — HEPARIN SOD (PORK) LOCK FLUSH 100 UNIT/ML IV SOLN
500.0000 [IU] | Freq: Once | INTRAVENOUS | Status: AC | PRN
  Administered 2022-04-30: 500 [IU]

## 2022-04-30 MED ORDER — SODIUM CHLORIDE 0.9 % IV SOLN
75.0000 mg/m2 | Freq: Once | INTRAVENOUS | Status: AC
  Administered 2022-04-30: 140 mg via INTRAVENOUS
  Filled 2022-04-30: qty 14

## 2022-04-30 MED FILL — Azacitidine For Inj 100 MG: INTRAMUSCULAR | Qty: 14 | Status: AC

## 2022-04-30 NOTE — Patient Instructions (Signed)
[facility]  Discharge Instructions: Thank you for choosing [facility] to provide your oncology and hematology care.  If you have a lab appointment with the [facility], please come in thru the Main Entrance and check in at the main information desk.  Wear comfortable clothing and clothing appropriate for easy access to any Portacath or PICC line.   We strive to give you quality time with your provider. You may need to reschedule your appointment if you arrive late (15 or more minutes).  Arriving late affects you and other patients whose appointments are after yours.  Also, if you miss three or more appointments without notifying the office, you may be dismissed from the clinic at the provider's discretion.      For prescription refill requests, have your pharmacy contact our office and allow 72 hours for refills to be completed.    Today you received the following chemotherapy and/or immunotherapy agents D2 Vidaza   To help prevent nausea and vomiting after your treatment, we encourage you to take your nausea medication as directed.   Azacitidine Injection What is this medication? AZACITIDINE (ay [place]) treats blood and bone marrow cancers. It works by slowing down the growth of cancer cells. This medicine may be used for other purposes; ask your health care provider or pharmacist if you have questions. COMMON BRAND NAME(S): Vidaza What should I tell my care team before I take this medication? They need to know if you have any of these conditions: Kidney disease Liver disease Low blood cell levels, such as low white cells, platelets, or red blood cells Low levels of albumin in the blood Low levels of bicarbonate in the blood An unusual or allergic reaction to azacitidine, mannitol, other medications, foods, dyes, or preservatives If you or your partner are pregnant or trying to get pregnant Breast-feeding How should I use this  medication? This medication is injected into a vein or under the skin. It is given by your care team in a hospital or clinic setting. Talk to your care team about the use of this medication in children. While it may be prescribed for children as young as 1 month for selected conditions, precautions do apply. Overdosage: If you think you have taken too much of this medicine contact a poison control center or emergency room at once. NOTE: This medicine is only for you. Do not share this medicine with others. What if I miss a dose? Keep appointments for follow-up doses. It is important not to miss your dose. Call your care team if you are unable to keep an appointment. What may interact with this medication? Interactions are not expected. This list may not describe all possible interactions. Give your health care provider a list of all the medicines, herbs, non-prescription drugs, or dietary supplements you use. Also tell them if you smoke, drink alcohol, or use illegal drugs. Some items may interact with your medicine. What should I watch for while using this medication? Your condition will be monitored carefully while you are receiving this medication. You may need blood work while taking this medication. This medication may make you feel generally unwell. This is not uncommon as chemotherapy can affect healthy cells as well as cancer cells. Report any side effects. Continue your course of treatment even though you feel ill unless your care team tells you to stop. Other product types may be available that contain the medication azacitidine. The injection and oral products should  not be used in place of one another. Talk to your care team if you have questions. This medication can cause serious side effects. To reduce the risk, your care team may give you other medications to take before receiving this one. Be sure to follow the directions from your care team. This medication may increase your risk of  getting an infection. Call your care team for advice if you get a fever, chills, sore throat, or other symptoms of a cold or flu. Do not treat yourself. Try to avoid being around people who are sick. Avoid taking medications that contain aspirin, acetaminophen, ibuprofen, naproxen, or ketoprofen unless instructed by your care team. These medications may hide a fever. This medication may increase your risk to bruise or bleed. Call your care team if you notice any unusual bleeding. Be careful brushing or flossing your teeth or using a toothpick because you may get an infection or bleed more easily. If you have any dental work done, tell your dentist you are receiving this medication. Talk to your care team if you or your partner may be pregnant. Serious birth defects can occur if you take this medication during pregnancy and for 6 months after the last dose. You will need a negative pregnancy test before starting this medication. Contraception is recommended while taking his medication and for 6 months after the last dose. Your care team can help you find the option that works for you. If your partner can get pregnant, use a condom during sex while taking this medication and for 3 months after the last dose. Do not breastfeed while taking this medication and for 1 week after the last dose. This medication may cause infertility. Talk to your care team if you are concerned about your fertility. What side effects may I notice from receiving this medication? Side effects that you should report to your care team as soon as possible: Allergic reactions--skin rash, itching, hives, swelling of the face, lips, tongue, or throat Infection--fever, chills, cough, sore throat, wounds that don't heal, pain or trouble when passing urine, general feeling of discomfort or being unwell Kidney injury--decrease in the amount of urine, swelling of the ankles, hands, or feet Liver injury--right upper belly pain, loss of  appetite, nausea, light-colored stool, dark yellow or brown urine, yellowing skin or eyes, unusual weakness or fatigue Low red blood cell level--unusual weakness or fatigue, dizziness, headache, trouble breathing Tumor lysis syndrome (TLS)--nausea, vomiting, diarrhea, decrease in the amount of urine, dark urine, unusual weakness or fatigue, confusion, muscle pain or cramps, fast or irregular heartbeat, joint pain Unusual bruising or bleeding Side effects that usually do not require medical attention (report to your care team if they continue or are bothersome): Constipation Diarrhea Nausea Pain, redness, or irritation at injection site Vomiting This list may not describe all possible side effects. Call your doctor for medical advice about side effects. You may report side effects to FDA at 1-800-FDA-1088. Where should I keep my medication? This medication is given in a hospital or clinic. It will not be stored at home. NOTE: This sheet is a summary. It may not cover all possible information. If you have questions about this medicine, talk to your doctor, pharmacist, or health care provider.  2023 Elsevier/Gold Standard (2021-07-19 00:00:00)  BELOW ARE SYMPTOMS THAT SHOULD BE REPORTED IMMEDIATELY: *FEVER GREATER THAN 100.4 F (38 C) OR HIGHER *CHILLS OR SWEATING *NAUSEA AND VOMITING THAT IS NOT CONTROLLED WITH YOUR NAUSEA MEDICATION *UNUSUAL SHORTNESS OF BREATH *UNUSUAL BRUISING  OR BLEEDING *URINARY PROBLEMS (pain or burning when urinating, or frequent urination) *BOWEL PROBLEMS (unusual diarrhea, constipation, pain near the anus) TENDERNESS IN MOUTH AND THROAT WITH OR WITHOUT PRESENCE OF ULCERS (sore throat, sores in mouth, or a toothache) UNUSUAL RASH, SWELLING OR PAIN  UNUSUAL VAGINAL DISCHARGE OR ITCHING   Items with * indicate a potential emergency and should be followed up as soon as possible or go to the Emergency Department if any problems should occur.  Please show the  CHEMOTHERAPY ALERT CARD or IMMUNOTHERAPY ALERT CARD at check-in to the Emergency Department and triage nurse.  Should you have questions after your visit or need to cancel or reschedule your appointment, please contact [facility] [phone]  and follow the prompts.  Office hours are 8:00 a.m. to 4:30 p.m. Monday - Friday. Please note that voicemails left after 4:00 p.m. may not be returned until the following business day.  We are closed weekends and major holidays. You have access to a nurse at all times for urgent questions. Please call the main number to the clinic [phone] and follow the prompts.  For any non-urgent questions, you may also contact your provider using MyChart. We now offer e-Visits for anyone 60 and older to request care online for non-urgent symptoms. For details visit [email or web address].   Also download the MyChart app! Go to the app store, search "MyChart", open the app, select [place], and log in with your MyChart username and password.

## 2022-04-30 NOTE — Progress Notes (Signed)
Pt presents today for D2 Vidaza per provider's order. Vital stable and pt voiced no new complaints at this time.  D2 Vidaza given today per MD orders. Tolerated infusion without adverse affects. Vital signs stable. No complaints at this time. Discharged from clinic ambulatory in stable condition. Alert and oriented x 3. F/U with [facility] as scheduled.

## 2022-05-01 ENCOUNTER — Inpatient Hospital Stay: Payer: Medicare HMO

## 2022-05-01 VITALS — BP 143/89 | HR 73 | Temp 97.2°F | Resp 16 | Wt 154.4 lb

## 2022-05-01 DIAGNOSIS — Z95828 Presence of other vascular implants and grafts: Secondary | ICD-10-CM

## 2022-05-01 DIAGNOSIS — Z5111 Encounter for antineoplastic chemotherapy: Secondary | ICD-10-CM | POA: Diagnosis not present

## 2022-05-01 DIAGNOSIS — D469 Myelodysplastic syndrome, unspecified: Secondary | ICD-10-CM

## 2022-05-01 MED ORDER — SODIUM CHLORIDE 0.9% FLUSH
10.0000 mL | INTRAVENOUS | Status: DC | PRN
  Administered 2022-05-01: 10 mL

## 2022-05-01 MED ORDER — HEPARIN SOD (PORK) LOCK FLUSH 100 UNIT/ML IV SOLN
500.0000 [IU] | Freq: Once | INTRAVENOUS | Status: AC | PRN
  Administered 2022-05-01: 500 [IU]

## 2022-05-01 MED ORDER — PALONOSETRON HCL INJECTION 0.25 MG/5ML
0.2500 mg | Freq: Once | INTRAVENOUS | Status: AC
  Administered 2022-05-01: 0.25 mg via INTRAVENOUS
  Filled 2022-05-01: qty 5

## 2022-05-01 MED ORDER — SODIUM CHLORIDE 0.9 % IV SOLN: Freq: Once | INTRAVENOUS | Status: AC

## 2022-05-01 MED ORDER — SODIUM CHLORIDE 0.9 % IV SOLN
75.0000 mg/m2 | Freq: Once | INTRAVENOUS | Status: AC
  Administered 2022-05-01: 140 mg via INTRAVENOUS
  Filled 2022-05-01: qty 14

## 2022-05-01 MED ORDER — SODIUM CHLORIDE 0.9 % IV SOLN
10.0000 mg | Freq: Once | INTRAVENOUS | Status: AC
  Administered 2022-05-01: 10 mg via INTRAVENOUS
  Filled 2022-05-01: qty 10

## 2022-05-01 NOTE — Patient Instructions (Signed)
[facility]  Discharge Instructions: Thank you for choosing [facility] to provide your oncology and hematology care.  If you have a lab appointment with the [facility], please come in thru the Main Entrance and check in at the main information desk.  Wear comfortable clothing and clothing appropriate for easy access to any Portacath or PICC line.   We strive to give you quality time with your provider. You may need to reschedule your appointment if you arrive late (15 or more minutes).  Arriving late affects you and other patients whose appointments are after yours.  Also, if you miss three or more appointments without notifying the office, you may be dismissed from the clinic at the provider's discretion.      For prescription refill requests, have your pharmacy contact our office and allow 72 hours for refills to be completed.    Today you received the following chemotherapy and/or immunotherapy agents D3 Vidaza   To help prevent nausea and vomiting after your treatment, we encourage you to take your nausea medication as directed.  Azacitidine Injection What is this medication? AZACITIDINE (ay [place]) treats blood and bone marrow cancers. It works by slowing down the growth of cancer cells. This medicine may be used for other purposes; ask your health care provider or pharmacist if you have questions. COMMON BRAND NAME(S): Vidaza What should I tell my care team before I take this medication? They need to know if you have any of these conditions: Kidney disease Liver disease Low blood cell levels, such as low white cells, platelets, or red blood cells Low levels of albumin in the blood Low levels of bicarbonate in the blood An unusual or allergic reaction to azacitidine, mannitol, other medications, foods, dyes, or preservatives If you or your partner are pregnant or trying to get pregnant Breast-feeding How should I use this  medication? This medication is injected into a vein or under the skin. It is given by your care team in a hospital or clinic setting. Talk to your care team about the use of this medication in children. While it may be prescribed for children as young as 1 month for selected conditions, precautions do apply. Overdosage: If you think you have taken too much of this medicine contact a poison control center or emergency room at once. NOTE: This medicine is only for you. Do not share this medicine with others. What if I miss a dose? Keep appointments for follow-up doses. It is important not to miss your dose. Call your care team if you are unable to keep an appointment. What may interact with this medication? Interactions are not expected. This list may not describe all possible interactions. Give your health care provider a list of all the medicines, herbs, non-prescription drugs, or dietary supplements you use. Also tell them if you smoke, drink alcohol, or use illegal drugs. Some items may interact with your medicine. What should I watch for while using this medication? Your condition will be monitored carefully while you are receiving this medication. You may need blood work while taking this medication. This medication may make you feel generally unwell. This is not uncommon as chemotherapy can affect healthy cells as well as cancer cells. Report any side effects. Continue your course of treatment even though you feel ill unless your care team tells you to stop. Other product types may be available that contain the medication azacitidine. The injection and oral products should not  be used in place of one another. Talk to your care team if you have questions. This medication can cause serious side effects. To reduce the risk, your care team may give you other medications to take before receiving this one. Be sure to follow the directions from your care team. This medication may increase your risk of  getting an infection. Call your care team for advice if you get a fever, chills, sore throat, or other symptoms of a cold or flu. Do not treat yourself. Try to avoid being around people who are sick. Avoid taking medications that contain aspirin, acetaminophen, ibuprofen, naproxen, or ketoprofen unless instructed by your care team. These medications may hide a fever. This medication may increase your risk to bruise or bleed. Call your care team if you notice any unusual bleeding. Be careful brushing or flossing your teeth or using a toothpick because you may get an infection or bleed more easily. If you have any dental work done, tell your dentist you are receiving this medication. Talk to your care team if you or your partner may be pregnant. Serious birth defects can occur if you take this medication during pregnancy and for 6 months after the last dose. You will need a negative pregnancy test before starting this medication. Contraception is recommended while taking his medication and for 6 months after the last dose. Your care team can help you find the option that works for you. If your partner can get pregnant, use a condom during sex while taking this medication and for 3 months after the last dose. Do not breastfeed while taking this medication and for 1 week after the last dose. This medication may cause infertility. Talk to your care team if you are concerned about your fertility. What side effects may I notice from receiving this medication? Side effects that you should report to your care team as soon as possible: Allergic reactions--skin rash, itching, hives, swelling of the face, lips, tongue, or throat Infection--fever, chills, cough, sore throat, wounds that don't heal, pain or trouble when passing urine, general feeling of discomfort or being unwell Kidney injury--decrease in the amount of urine, swelling of the ankles, hands, or feet Liver injury--right upper belly pain, loss of  appetite, nausea, light-colored stool, dark yellow or brown urine, yellowing skin or eyes, unusual weakness or fatigue Low red blood cell level--unusual weakness or fatigue, dizziness, headache, trouble breathing Tumor lysis syndrome (TLS)--nausea, vomiting, diarrhea, decrease in the amount of urine, dark urine, unusual weakness or fatigue, confusion, muscle pain or cramps, fast or irregular heartbeat, joint pain Unusual bruising or bleeding Side effects that usually do not require medical attention (report to your care team if they continue or are bothersome): Constipation Diarrhea Nausea Pain, redness, or irritation at injection site Vomiting This list may not describe all possible side effects. Call your doctor for medical advice about side effects. You may report side effects to FDA at 1-800-FDA-1088. Where should I keep my medication? This medication is given in a hospital or clinic. It will not be stored at home. NOTE: This sheet is a summary. It may not cover all possible information. If you have questions about this medicine, talk to your doctor, pharmacist, or health care provider.  2023 Elsevier/Gold Standard (2021-07-19 00:00:00)   BELOW ARE SYMPTOMS THAT SHOULD BE REPORTED IMMEDIATELY: *FEVER GREATER THAN 100.4 F (38 C) OR HIGHER *CHILLS OR SWEATING *NAUSEA AND VOMITING THAT IS NOT CONTROLLED WITH YOUR NAUSEA MEDICATION *UNUSUAL SHORTNESS OF BREATH *UNUSUAL BRUISING  OR BLEEDING *URINARY PROBLEMS (pain or burning when urinating, or frequent urination) *BOWEL PROBLEMS (unusual diarrhea, constipation, pain near the anus) TENDERNESS IN MOUTH AND THROAT WITH OR WITHOUT PRESENCE OF ULCERS (sore throat, sores in mouth, or a toothache) UNUSUAL RASH, SWELLING OR PAIN  UNUSUAL VAGINAL DISCHARGE OR ITCHING   Items with * indicate a potential emergency and should be followed up as soon as possible or go to the Emergency Department if any problems should occur.  Please show the  CHEMOTHERAPY ALERT CARD or IMMUNOTHERAPY ALERT CARD at check-in to the Emergency Department and triage nurse.  Should you have questions after your visit or need to cancel or reschedule your appointment, please contact [facility] [phone]  and follow the prompts.  Office hours are 8:00 a.m. to 4:30 p.m. Monday - Friday. Please note that voicemails left after 4:00 p.m. may not be returned until the following business day.  We are closed weekends and major holidays. You have access to a nurse at all times for urgent questions. Please call the main number to the clinic [phone] and follow the prompts.  For any non-urgent questions, you may also contact your provider using MyChart. We now offer e-Visits for anyone 60 and older to request care online for non-urgent symptoms. For details visit [email or web address].   Also download the MyChart app! Go to the app store, search "MyChart", open the app, select [facility], and log in with your MyChart username and password.

## 2022-05-01 NOTE — Progress Notes (Signed)
Pt presents today for D3 Vidaza per provider's order. Vital signs stable and pt voiced no new complaints at this time.  D3 Vidaza given today per MD orders. Tolerated infusion without adverse affects. Vital signs stable. No complaints at this time. Discharged from clinic ambulatory in stable condition. Alert and oriented x 3. F/U with [facility] as scheduled.   

## 2022-05-02 ENCOUNTER — Inpatient Hospital Stay: Payer: Medicare HMO

## 2022-05-02 VITALS — BP 145/82 | HR 60 | Temp 97.5°F | Resp 18

## 2022-05-02 DIAGNOSIS — Z95828 Presence of other vascular implants and grafts: Secondary | ICD-10-CM

## 2022-05-02 DIAGNOSIS — Z5111 Encounter for antineoplastic chemotherapy: Secondary | ICD-10-CM | POA: Diagnosis not present

## 2022-05-02 DIAGNOSIS — D469 Myelodysplastic syndrome, unspecified: Secondary | ICD-10-CM

## 2022-05-02 MED ORDER — SODIUM CHLORIDE 0.9 % IV SOLN: Freq: Once | INTRAVENOUS | Status: AC

## 2022-05-02 MED ORDER — HEPARIN SOD (PORK) LOCK FLUSH 100 UNIT/ML IV SOLN
500.0000 [IU] | Freq: Once | INTRAVENOUS | Status: AC | PRN
  Administered 2022-05-02: 500 [IU]

## 2022-05-02 MED ORDER — SODIUM CHLORIDE 0.9 % IV SOLN
10.0000 mg | Freq: Once | INTRAVENOUS | Status: AC
  Administered 2022-05-02: 10 mg via INTRAVENOUS
  Filled 2022-05-02: qty 10

## 2022-05-02 MED ORDER — SODIUM CHLORIDE 0.9% FLUSH
10.0000 mL | INTRAVENOUS | Status: DC | PRN
  Administered 2022-05-02: 10 mL

## 2022-05-02 MED ORDER — SODIUM CHLORIDE 0.9 % IV SOLN
75.0000 mg/m2 | Freq: Once | INTRAVENOUS | Status: AC
  Administered 2022-05-02: 140 mg via INTRAVENOUS
  Filled 2022-05-02: qty 14

## 2022-05-02 MED FILL — Azacitidine For Inj 100 MG: INTRAMUSCULAR | Qty: 14 | Status: AC

## 2022-05-02 NOTE — Progress Notes (Signed)
Patient presents today for Vidaza D4. Vital signs within parameters for treatment. Patient has no complaints of any side effects related to treatment.   Treatment given today per MD orders. Tolerated infusion without adverse affects. Vital signs stable. No complaints at this time. Discharged from clinic ambulatory in stable condition. Alert and oriented x 3. F/U with [facility] as scheduled.

## 2022-05-02 NOTE — Patient Instructions (Signed)
Azacitidine Injection What is this medication? AZACITIDINE (ay [place]) treats blood and bone marrow cancers. It works by slowing down the growth of cancer cells. This medicine may be used for other purposes; ask your health care provider or pharmacist if you have questions. COMMON BRAND NAME(S): Vidaza What should I tell my care team before I take this medication? They need to know if you have any of these conditions: Kidney disease Liver disease Low blood cell levels, such as low white cells, platelets, or red blood cells Low levels of albumin in the blood Low levels of bicarbonate in the blood An unusual or allergic reaction to azacitidine, mannitol, other medications, foods, dyes, or preservatives If you or your partner are pregnant or trying to get pregnant Breast-feeding How should I use this medication? This medication is injected into a vein or under the skin. It is given by your care team in a hospital or clinic setting. Talk to your care team about the use of this medication in children. While it may be prescribed for children as young as 1 month for selected conditions, precautions do apply. Overdosage: If you think you have taken too much of this medicine contact a poison control center or emergency room at once. NOTE: This medicine is only for you. Do not share this medicine with others. What if I miss a dose? Keep appointments for follow-up doses. It is important not to miss your dose. Call your care team if you are unable to keep an appointment. What may interact with this medication? Interactions are not expected. This list may not describe all possible interactions. Give your health care provider a list of all the medicines, herbs, non-prescription drugs, or dietary supplements you use. Also tell them if you smoke, drink alcohol, or use illegal drugs. Some items may interact with your medicine. What should I watch for while using this medication? Your condition will  be monitored carefully while you are receiving this medication. You may need blood work while taking this medication. This medication may make you feel generally unwell. This is not uncommon as chemotherapy can affect healthy cells as well as cancer cells. Report any side effects. Continue your course of treatment even though you feel ill unless your care team tells you to stop. Other product types may be available that contain the medication azacitidine. The injection and oral products should not be used in place of one another. Talk to your care team if you have questions. This medication can cause serious side effects. To reduce the risk, your care team may give you other medications to take before receiving this one. Be sure to follow the directions from your care team. This medication may increase your risk of getting an infection. Call your care team for advice if you get a fever, chills, sore throat, or other symptoms of a cold or flu. Do not treat yourself. Try to avoid being around people who are sick. Avoid taking medications that contain aspirin, acetaminophen, ibuprofen, naproxen, or ketoprofen unless instructed by your care team. These medications may hide a fever. This medication may increase your risk to bruise or bleed. Call your care team if you notice any unusual bleeding. Be careful brushing or flossing your teeth or using a toothpick because you may get an infection or bleed more easily. If you have any dental work done, tell your dentist you are receiving this medication. Talk to your care team if you or your partner may be pregnant. Serious  birth defects can occur if you take this medication during pregnancy and for 6 months after the last dose. You will need a negative pregnancy test before starting this medication. Contraception is recommended while taking his medication and for 6 months after the last dose. Your care team can help you find the option that works for you. If your  partner can get pregnant, use a condom during sex while taking this medication and for 3 months after the last dose. Do not breastfeed while taking this medication and for 1 week after the last dose. This medication may cause infertility. Talk to your care team if you are concerned about your fertility. What side effects may I notice from receiving this medication? Side effects that you should report to your care team as soon as possible: Allergic reactions--skin rash, itching, hives, swelling of the face, lips, tongue, or throat Infection--fever, chills, cough, sore throat, wounds that don't heal, pain or trouble when passing urine, general feeling of discomfort or being unwell Kidney injury--decrease in the amount of urine, swelling of the ankles, hands, or feet Liver injury--right upper belly pain, loss of appetite, nausea, light-colored stool, dark yellow or brown urine, yellowing skin or eyes, unusual weakness or fatigue Low red blood cell level--unusual weakness or fatigue, dizziness, headache, trouble breathing Tumor lysis syndrome (TLS)--nausea, vomiting, diarrhea, decrease in the amount of urine, dark urine, unusual weakness or fatigue, confusion, muscle pain or cramps, fast or irregular heartbeat, joint pain Unusual bruising or bleeding Side effects that usually do not require medical attention (report to your care team if they continue or are bothersome): Constipation Diarrhea Nausea Pain, redness, or irritation at injection site Vomiting This list may not describe all possible side effects. Call your doctor for medical advice about side effects. You may report side effects to FDA at 1-800-FDA-1088. Where should I keep my medication? This medication is given in a hospital or clinic. It will not be stored at home. NOTE: This sheet is a summary. It may not cover all possible information. If you have questions about this medicine, talk to your doctor, pharmacist, or health care  provider.  2023 Elsevier/Gold Standard (2021-07-19 00:00:00) [facility]  Discharge Instructions: Thank you for choosing [facility] to provide your oncology and hematology care.  If you have a lab appointment with the [facility], please come in thru the Main Entrance and check in at the main information desk.  Wear comfortable clothing and clothing appropriate for easy access to any Portacath or PICC line.   We strive to give you quality time with your provider. You may need to reschedule your appointment if you arrive late (15 or more minutes).  Arriving late affects you and other patients whose appointments are after yours.  Also, if you miss three or more appointments without notifying the office, you may be dismissed from the clinic at the provider's discretion.      For prescription refill requests, have your pharmacy contact our office and allow 72 hours for refills to be completed.    Today you received the following chemotherapy and/or immunotherapy agents vidaza.      To help prevent nausea and vomiting after your treatment, we encourage you to take your nausea medication as directed.  BELOW ARE SYMPTOMS THAT SHOULD BE REPORTED IMMEDIATELY: *FEVER GREATER THAN 100.4 F (38 C) OR HIGHER *CHILLS OR SWEATING *NAUSEA AND VOMITING THAT IS NOT CONTROLLED WITH YOUR NAUSEA MEDICATION *UNUSUAL SHORTNESS OF BREATH *UNUSUAL BRUISING  OR BLEEDING *URINARY PROBLEMS (pain or burning when urinating, or frequent urination) *BOWEL PROBLEMS (unusual diarrhea, constipation, pain near the anus) TENDERNESS IN MOUTH AND THROAT WITH OR WITHOUT PRESENCE OF ULCERS (sore throat, sores in mouth, or a toothache) UNUSUAL RASH, SWELLING OR PAIN  UNUSUAL VAGINAL DISCHARGE OR ITCHING   Items with * indicate a potential emergency and should be followed up as soon as possible or go to the Emergency Department if any problems should occur.  Please show the CHEMOTHERAPY  ALERT CARD or IMMUNOTHERAPY ALERT CARD at check-in to the Emergency Department and triage nurse.  Should you have questions after your visit or need to cancel or reschedule your appointment, please contact [facility] [phone]  and follow the prompts.  Office hours are 8:00 a.m. to 4:30 p.m. Monday - Friday. Please note that voicemails left after 4:00 p.m. may not be returned until the following business day.  We are closed weekends and major holidays. You have access to a nurse at all times for urgent questions. Please call the main number to the clinic [phone] and follow the prompts.  For any non-urgent questions, you may also contact your provider using MyChart. We now offer e-Visits for anyone 74 and older to request care online for non-urgent symptoms. For details visit [email or web address].   Also download the MyChart app! Go to the app store, search "MyChart", open the app, select , and log in with your MyChart username and password.

## 2022-05-03 ENCOUNTER — Inpatient Hospital Stay: Payer: Medicare HMO

## 2022-05-03 VITALS — BP 137/85 | HR 57 | Temp 98.2°F | Resp 18

## 2022-05-03 DIAGNOSIS — D469 Myelodysplastic syndrome, unspecified: Secondary | ICD-10-CM

## 2022-05-03 DIAGNOSIS — Z5111 Encounter for antineoplastic chemotherapy: Secondary | ICD-10-CM | POA: Diagnosis not present

## 2022-05-03 DIAGNOSIS — Z95828 Presence of other vascular implants and grafts: Secondary | ICD-10-CM

## 2022-05-03 MED ORDER — PALONOSETRON HCL INJECTION 0.25 MG/5ML
0.2500 mg | Freq: Once | INTRAVENOUS | Status: AC
  Administered 2022-05-03: 0.25 mg via INTRAVENOUS
  Filled 2022-05-03: qty 5

## 2022-05-03 MED ORDER — SODIUM CHLORIDE 0.9 % IV SOLN
10.0000 mg | Freq: Once | INTRAVENOUS | Status: AC
  Administered 2022-05-03: 10 mg via INTRAVENOUS
  Filled 2022-05-03: qty 10

## 2022-05-03 MED ORDER — HEPARIN SOD (PORK) LOCK FLUSH 100 UNIT/ML IV SOLN
500.0000 [IU] | Freq: Once | INTRAVENOUS | Status: AC | PRN
  Administered 2022-05-03: 500 [IU]

## 2022-05-03 MED ORDER — SODIUM CHLORIDE 0.9 % IV SOLN: Freq: Once | INTRAVENOUS | Status: AC

## 2022-05-03 MED ORDER — SODIUM CHLORIDE 0.9% FLUSH
10.0000 mL | INTRAVENOUS | Status: DC | PRN
  Administered 2022-05-03: 10 mL

## 2022-05-03 MED ORDER — SODIUM CHLORIDE 0.9 % IV SOLN
75.0000 mg/m2 | Freq: Once | INTRAVENOUS | Status: AC
  Administered 2022-05-03: 140 mg via INTRAVENOUS
  Filled 2022-05-03: qty 14

## 2022-05-03 NOTE — Progress Notes (Signed)
Pt presents today for D5 Vidaza per provider's order. Vital signs stable and pt voiced no new complaints at this time.  D5 Vidaza given today per MD orders. Tolerated infusion without adverse affects. Vital signs stable. No complaints at this time. Discharged from clinic ambulatory in stable condition. Alert and oriented x 3. F/U with [facility] as scheduled.

## 2022-05-03 NOTE — Patient Instructions (Signed)
Chad Lamb  Discharge Instructions: Thank you for choosing [facility] to provide your oncology and hematology care.  If you have a lab appointment with the [facility], please come in thru the Main Entrance and check in at the main information desk.  Wear comfortable clothing and clothing appropriate for easy access to any Portacath or PICC line.   We strive to give you quality time with your provider. You may need to reschedule your appointment if you arrive late (15 or more minutes).  Arriving late affects you and other patients whose appointments are after yours.  Also, if you miss three or more appointments without notifying the office, you may be dismissed from the clinic at the provider's discretion.      For prescription refill requests, have your pharmacy contact our office and allow 72 hours for refills to be completed.    Today you received the following chemotherapy and/or immunotherapy agents D5 Vidaza   To help prevent nausea and vomiting after your treatment, we encourage you to take your nausea medication as directed.  Azacitidine Injection What is this medication? AZACITIDINE ([place]) treats blood and bone marrow cancers. It works by slowing down the growth of cancer cells. This medicine may be used for other purposes; ask your health care provider or pharmacist if you have questions. COMMON BRAND NAME(S): Vidaza What should I tell my care team before I take this medication? They need to know if you have any of these conditions: Kidney disease Liver disease Low blood cell levels, such as low white cells, platelets, or red blood cells Low levels of albumin in the blood Low levels of bicarbonate in the blood An unusual or allergic reaction to azacitidine, mannitol, other medications, foods, dyes, or preservatives If you or your partner are pregnant or trying to get pregnant Breast-feeding How should I use this  medication? This medication is injected into a vein or under the skin. It is given by your care team in a hospital or clinic setting. Talk to your care team about the use of this medication in children. While it may be prescribed for children as young as 1 month for selected conditions, precautions do apply. Overdosage: If you think you have taken too much of this medicine contact a poison control center or emergency room at once. NOTE: This medicine is only for you. Do not share this medicine with others. What if I miss a dose? Keep appointments for follow-up doses. It is important not to miss your dose. Call your care team if you are unable to keep an appointment. What may interact with this medication? Interactions are not expected. This list may not describe all possible interactions. Give your health care provider a list of all the medicines, herbs, non-prescription drugs, or dietary supplements you use. Also tell them if you smoke, drink alcohol, or use illegal drugs. Some items may interact with your medicine. What should I watch for while using this medication? Your condition will be monitored carefully while you are receiving this medication. You may need blood work while taking this medication. This medication may make you feel generally unwell. This is not uncommon as chemotherapy can affect healthy cells as well as cancer cells. Report any side effects. Continue your course of treatment even though you feel ill unless your care team tells you to stop. Other product types may be available that contain the medication azacitidine. The injection and oral products should not  be used in place of one another. Talk to your care team if you have questions. This medication can cause serious side effects. To reduce the risk, your care team may give you other medications to take before receiving this one. Be sure to follow the directions from your care team. This medication may increase your risk of  getting an infection. Call your care team for advice if you get a fever, chills, sore throat, or other symptoms of a cold or flu. Do not treat yourself. Try to avoid being around people who are sick. Avoid taking medications that contain aspirin, acetaminophen, ibuprofen, naproxen, or ketoprofen unless instructed by your care team. These medications may hide a fever. This medication may increase your risk to bruise or bleed. Call your care team if you notice any unusual bleeding. Be careful brushing or flossing your teeth or using a toothpick because you may get an infection or bleed more easily. If you have any dental work done, tell your dentist you are receiving this medication. Talk to your care team if you or your partner may be pregnant. Serious birth defects can occur if you take this medication during pregnancy and for 6 months after the last dose. You will need a negative pregnancy test before starting this medication. Contraception is recommended while taking his medication and for 6 months after the last dose. Your care team can help you find the option that works for you. If your partner can get pregnant, use a condom during sex while taking this medication and for 3 months after the last dose. Do not breastfeed while taking this medication and for 1 week after the last dose. This medication may cause infertility. Talk to your care team if you are concerned about your fertility. What side effects may I notice from receiving this medication? Side effects that you should report to your care team as soon as possible: Allergic reactions--skin rash, itching, hives, swelling of the face, lips, tongue, or throat Infection--fever, chills, cough, sore throat, wounds that don't heal, pain or trouble when passing urine, general feeling of discomfort or being unwell Kidney injury--decrease in the amount of urine, swelling of the ankles, hands, or feet Liver injury--right upper belly pain, loss of  appetite, nausea, light-colored stool, dark yellow or brown urine, yellowing skin or eyes, unusual weakness or fatigue Low red blood cell level--unusual weakness or fatigue, dizziness, headache, trouble breathing Tumor lysis syndrome (TLS)--nausea, vomiting, diarrhea, decrease in the amount of urine, dark urine, unusual weakness or fatigue, confusion, muscle pain or cramps, fast or irregular heartbeat, joint pain Unusual bruising or bleeding Side effects that usually do not require medical attention (report to your care team if they continue or are bothersome): Constipation Diarrhea Nausea Pain, redness, or irritation at injection site Vomiting This list may not describe all possible side effects. Call your doctor for medical advice about side effects. You may report side effects to FDA at 1-800-FDA-1088. Where should I keep my medication? This medication is given in a hospital or clinic. It will not be stored at home. NOTE: This sheet is a summary. It may not cover all possible information. If you have questions about this medicine, talk to your doctor, pharmacist, or health care provider.  2023 Elsevier/Gold Standard (2021-07-19 00:00:00)   BELOW ARE SYMPTOMS THAT SHOULD BE REPORTED IMMEDIATELY: *FEVER GREATER THAN 100.4 F (38 C) OR HIGHER *CHILLS OR SWEATING *NAUSEA AND VOMITING THAT IS NOT CONTROLLED WITH YOUR NAUSEA MEDICATION *UNUSUAL SHORTNESS OF BREATH *UNUSUAL BRUISING  OR BLEEDING *URINARY PROBLEMS (pain or burning when urinating, or frequent urination) *BOWEL PROBLEMS (unusual diarrhea, constipation, pain near the anus) TENDERNESS IN MOUTH AND THROAT WITH OR WITHOUT PRESENCE OF ULCERS (sore throat, sores in mouth, or a toothache) UNUSUAL RASH, SWELLING OR PAIN  UNUSUAL VAGINAL DISCHARGE OR ITCHING   Items with * indicate a potential emergency and should be followed up as soon as possible or go to the Emergency Department if any problems should occur.  Please show the  CHEMOTHERAPY ALERT CARD or IMMUNOTHERAPY ALERT CARD at check-in to the Emergency Department and triage nurse.  Should you have questions after your visit or need to cancel or reschedule your appointment, please contact [facility] [phone]  and follow the prompts.  Office hours are 8:00 a.m. to 4:30 p.m. Monday - Friday. Please note that voicemails left after 4:00 p.m. may not be returned until the following business day.  We are closed weekends and major holidays. You have access to a nurse at all times for urgent questions. Please call the main number to the clinic [phone] and follow the prompts.  For any non-urgent questions, you may also contact your provider using MyChart. We now offer e-Visits for anyone 60 and older to request care online for non-urgent symptoms. For details visit [email or web address].   Also download the MyChart app! Go to the app store, search "MyChart", open the app, select [facility], and log in with your MyChart username and password.

## 2022-05-06 ENCOUNTER — Other Ambulatory Visit: Payer: Self-pay | Admitting: Hematology

## 2022-05-06 ENCOUNTER — Inpatient Hospital Stay: Payer: Medicare HMO

## 2022-05-06 ENCOUNTER — Telehealth: Payer: Self-pay | Admitting: *Deleted

## 2022-05-06 MED ORDER — PAXLOVID (300/100) 20 X 150 MG & 10 X 100MG PO TBPK: 1.0000 | ORAL_TABLET | Freq: Two times a day (BID) | ORAL | 0 refills | Status: AC

## 2022-05-06 NOTE — Telephone Encounter (Signed)
Per Dr. Delton Coombes, he received a call over the weekend with report that patient has COVID.  He advised him not to come for days 6 and 7 of azacitidine today.  I have told him to stop venetoclax.  I just sent the prescription for Paxlovid. I called patient this morning to ensure understanding of instructions and make him aware that all appointments for this week would be rescheduled.  Verbalized understanding.

## 2022-05-07 ENCOUNTER — Inpatient Hospital Stay: Payer: Medicare HMO

## 2022-05-08 ENCOUNTER — Other Ambulatory Visit (HOSPITAL_COMMUNITY): Payer: Self-pay

## 2022-05-10 ENCOUNTER — Ambulatory Visit (HOSPITAL_COMMUNITY): Payer: Medicare HMO

## 2022-05-13 ENCOUNTER — Other Ambulatory Visit: Payer: Self-pay

## 2022-05-16 ENCOUNTER — Other Ambulatory Visit (HOSPITAL_COMMUNITY): Payer: Self-pay

## 2022-05-17 ENCOUNTER — Other Ambulatory Visit: Payer: Self-pay | Admitting: Radiology

## 2022-05-17 ENCOUNTER — Other Ambulatory Visit: Payer: Self-pay | Admitting: Internal Medicine

## 2022-05-17 DIAGNOSIS — D469 Myelodysplastic syndrome, unspecified: Secondary | ICD-10-CM

## 2022-05-20 ENCOUNTER — Other Ambulatory Visit: Payer: Medicare HMO

## 2022-05-20 ENCOUNTER — Other Ambulatory Visit (HOSPITAL_COMMUNITY): Payer: Medicare HMO

## 2022-05-20 ENCOUNTER — Ambulatory Visit: Payer: Medicare HMO

## 2022-05-20 ENCOUNTER — Ambulatory Visit: Payer: Medicare HMO | Admitting: Hematology

## 2022-05-20 ENCOUNTER — Ambulatory Visit (HOSPITAL_COMMUNITY): Payer: Medicare HMO

## 2022-05-21 ENCOUNTER — Ambulatory Visit: Payer: Medicare HMO

## 2022-05-21 ENCOUNTER — Other Ambulatory Visit: Payer: Self-pay | Admitting: Hematology

## 2022-05-21 ENCOUNTER — Other Ambulatory Visit (HOSPITAL_COMMUNITY): Payer: Self-pay

## 2022-05-21 ENCOUNTER — Other Ambulatory Visit: Payer: Self-pay

## 2022-05-21 ENCOUNTER — Other Ambulatory Visit: Payer: Self-pay | Admitting: *Deleted

## 2022-05-21 DIAGNOSIS — D469 Myelodysplastic syndrome, unspecified: Secondary | ICD-10-CM

## 2022-05-21 MED ORDER — VENETOCLAX 100 MG PO TABS
200.0000 mg | ORAL_TABLET | Freq: Every day | ORAL | 0 refills | Status: DC
  Filled 2022-05-21: qty 28, 28d supply, fill #0

## 2022-05-21 NOTE — Telephone Encounter (Signed)
Refill approved for Venclexta.  Patient tolerating and is to continue therapy.

## 2022-05-22 ENCOUNTER — Other Ambulatory Visit (HOSPITAL_COMMUNITY): Payer: Self-pay

## 2022-05-22 ENCOUNTER — Ambulatory Visit: Payer: Medicare HMO

## 2022-05-22 ENCOUNTER — Other Ambulatory Visit: Payer: Self-pay

## 2022-05-23 ENCOUNTER — Other Ambulatory Visit: Payer: Self-pay | Admitting: Hematology

## 2022-05-23 ENCOUNTER — Ambulatory Visit: Payer: Medicare HMO

## 2022-05-23 DIAGNOSIS — D469 Myelodysplastic syndrome, unspecified: Secondary | ICD-10-CM

## 2022-05-23 DIAGNOSIS — Z95828 Presence of other vascular implants and grafts: Secondary | ICD-10-CM

## 2022-05-24 ENCOUNTER — Ambulatory Visit: Payer: Medicare HMO

## 2022-05-26 NOTE — Patient Instructions (Signed)
[facility] at [facility] Discharge Instructions   You were seen and examined today by Dr. Katragadda.  He reviewed the results of your lab work which are normal/stable.   We will proceed with your treatment today.  Return as scheduled.    Thank you for choosing [facility] at [facility] to provide your oncology and hematology care.  To afford each patient quality time with our provider, please arrive at least 15 minutes before your scheduled appointment time.   If you have a lab appointment with the Cancer Center please come in thru the Main Entrance and check in at the main information desk.  You need to re-schedule your appointment should you arrive 10 or more minutes late.  We strive to give you quality time with our providers, and arriving late affects you and other patients whose appointments are after yours.  Also, if you no show three or more times for appointments you may be dismissed from the clinic at the providers discretion.     Again, thank you for choosing [facility].  Our hope is that these requests will decrease the amount of time that you wait before being seen by our physicians.       _____________________________________________________________  Should you have questions after your visit to [facility], please contact our office at [phone] and follow the prompts.  Our office hours are 8:00 a.m. and 4:30 p.m. Monday - Friday.  Please note that voicemails left after 4:00 p.m. may not be returned until the following business day.  We are closed weekends and major holidays.  You do have access to a nurse 24-7, just call the main number to the clinic [phone] and do not press any options, hold on the line and a nurse will answer the phone.    For prescription refill requests, have your pharmacy contact our office and allow 72 hours.    Due to Covid, you will need to wear a mask upon entering  the hospital. If you do not have a mask, a mask will be given to you at the Main Entrance upon arrival. For doctor visits, patients may have 1 support person age 18 or older with them. For treatment visits, patients can not have anyone with them due to social distancing guidelines and our immunocompromised population.      

## 2022-05-27 ENCOUNTER — Inpatient Hospital Stay (HOSPITAL_BASED_OUTPATIENT_CLINIC_OR_DEPARTMENT_OTHER): Payer: Medicare HMO | Admitting: Hematology

## 2022-05-27 ENCOUNTER — Inpatient Hospital Stay: Payer: Medicare HMO | Attending: Hematology

## 2022-05-27 ENCOUNTER — Other Ambulatory Visit: Payer: Medicare HMO

## 2022-05-27 ENCOUNTER — Inpatient Hospital Stay: Payer: Medicare HMO

## 2022-05-27 ENCOUNTER — Ambulatory Visit: Payer: Medicare HMO

## 2022-05-27 DIAGNOSIS — D4622 Refractory anemia with excess of blasts 2: Secondary | ICD-10-CM | POA: Insufficient documentation

## 2022-05-27 DIAGNOSIS — D469 Myelodysplastic syndrome, unspecified: Secondary | ICD-10-CM

## 2022-05-27 DIAGNOSIS — Z86718 Personal history of other venous thrombosis and embolism: Secondary | ICD-10-CM | POA: Diagnosis not present

## 2022-05-27 DIAGNOSIS — Z95828 Presence of other vascular implants and grafts: Secondary | ICD-10-CM

## 2022-05-27 DIAGNOSIS — Z5111 Encounter for antineoplastic chemotherapy: Secondary | ICD-10-CM | POA: Insufficient documentation

## 2022-05-27 LAB — CBC WITH DIFFERENTIAL/PLATELET
Abs Immature Granulocytes: 0.01 10*3/uL (ref 0.00–0.07)
Basophils Absolute: 0.1 10*3/uL (ref 0.0–0.1)
Basophils Relative: 2 %
Eosinophils Absolute: 0 10*3/uL (ref 0.0–0.5)
Eosinophils Relative: 1 %
HCT: 35.2 % — ABNORMAL LOW (ref 39.0–52.0)
Hemoglobin: 11.3 g/dL — ABNORMAL LOW (ref 13.0–17.0)
Immature Granulocytes: 0 %
Lymphocytes Relative: 28 %
Lymphs Abs: 0.9 10*3/uL (ref 0.7–4.0)
MCH: 27.9 pg (ref 26.0–34.0)
MCHC: 32.1 g/dL (ref 30.0–36.0)
MCV: 86.9 fL (ref 80.0–100.0)
Monocytes Absolute: 0.3 10*3/uL (ref 0.1–1.0)
Monocytes Relative: 10 %
Neutro Abs: 2 10*3/uL (ref 1.7–7.7)
Neutrophils Relative %: 59 %
Platelets: 209 10*3/uL (ref 150–400)
RBC: 4.05 MIL/uL — ABNORMAL LOW (ref 4.22–5.81)
RDW: 16.3 % — ABNORMAL HIGH (ref 11.5–15.5)
WBC: 3.3 10*3/uL — ABNORMAL LOW (ref 4.0–10.5)
nRBC: 0 % (ref 0.0–0.2)

## 2022-05-27 LAB — COMPREHENSIVE METABOLIC PANEL
ALT: 14 U/L (ref 0–44)
AST: 20 U/L (ref 15–41)
Albumin: 3.7 g/dL (ref 3.5–5.0)
Alkaline Phosphatase: 65 U/L (ref 38–126)
Anion gap: 6 (ref 5–15)
BUN: 14 mg/dL (ref 8–23)
CO2: 26 mmol/L (ref 22–32)
Calcium: 8.9 mg/dL (ref 8.9–10.3)
Chloride: 104 mmol/L (ref 98–111)
Creatinine, Ser: 0.99 mg/dL (ref 0.61–1.24)
GFR, Estimated: >60 mL/min (ref >60)
Glucose, Bld: 126 mg/dL — ABNORMAL HIGH (ref 70–99)
Potassium: 3.7 mmol/L (ref 3.5–5.1)
Sodium: 136 mmol/L (ref 135–145)
Total Bilirubin: 1.5 mg/dL — ABNORMAL HIGH (ref 0.3–1.2)
Total Protein: 5.9 g/dL — ABNORMAL LOW (ref 6.5–8.1)

## 2022-05-27 LAB — MAGNESIUM: Magnesium: 2.1 mg/dL (ref 1.7–2.4)

## 2022-05-27 MED ORDER — HEPARIN SOD (PORK) LOCK FLUSH 100 UNIT/ML IV SOLN
500.0000 [IU] | Freq: Once | INTRAVENOUS | Status: AC
  Administered 2022-05-27: 500 [IU] via INTRAVENOUS

## 2022-05-27 MED ORDER — SODIUM CHLORIDE 0.9% FLUSH
10.0000 mL | INTRAVENOUS | Status: DC | PRN
  Administered 2022-05-27: 10 mL via INTRAVENOUS

## 2022-05-27 NOTE — Progress Notes (Signed)
Patients port flushed without difficulty.  Good blood return noted with no bruising or swelling noted at site.  Patient remains accessed for chemotherapy treatment.  

## 2022-05-27 NOTE — Progress Notes (Signed)
[facility] [address], [place] [address]    Clinic Day:  05/27/2022  Referring physician: [name], MD  Patient Care Team: [name], MD as PCP - General (Family Medicine) [name], MD (Family Medicine) Danie Binder, MD (Inactive) as Consulting Physician (Gastroenterology) [name], MD as Medical Oncologist (Hematology)   ASSESSMENT & PLAN:   Assessment: MDS with EB 2: - Seen at the request of Dr. Mannie Stabile for abnormal CBC. - Labs on 12/06/2020 shows white count 3.0 with 41% neutrophils, 41% lymphocytes, 13.8% monocytes.  ANC was 1.2.  Hemoglobin 13.4 and normal.  MCV was slightly high at 96.  Platelet count was 128.  Vitamin 123456 and folic acid were normal. - He took antibiotics for teeth implants few times this year, last 1 on 11/16/2020.  He does not know the antibiotic name.  He also reports taking over-the-counter pill for memory for 5 to 6 months, which was stopped around September 2022. - Labs from 07/07/2020 with creatinine 0.92.  White count 3.3 (37% neutrophils, 45% lymphocytes, 14% monocytes, 3% eosinophils), ANC 1.2.  Platelet count 149. - His prior CBC from 12/08/2017 in our system was completely normal. - Reports slight worsening of the night sweats in the last 3 months.  At least once per week and has to change clothes.  No fevers or weight loss. - BMBX on 06/04/2021: Hypercellular marrow with dyspoietic changes involving the granulocytic cell line and megakaryocytes.  Myeloblasts 8% on aspirate smears and 10% by flow.  Cytogenetics 60, XY (20).  MDS FISH panel normal. 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 - IPSS-M score of 0.12, moderate high risk.  Leukemia free survival is 2.3 years.  Overall survival 2.8 years. - Cycle 1 of azacitidine on 07/30/2021.  BMBX (01/04/2022): Overall improvement in blast count and cellularity.  Chromosome analysis and FISH panel normal.  Venetoclax 200 mg day 1 through 14 added with cycle 8 on  02/18/2022.   Social/family history: - He lives at home with his wife.  He swims about 40 minutes daily. 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  Non-smoker. - Paternal aunt had breast cancer and mother had pancreatic cancer.  3.  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 - He underwent thrombolytic therapy at [facility]. - He will followed up with vascular at [facility] and has been on Xarelto since then.  Plan: MDS with EB 2: - Cycle 10-day 1 was on 04/29/2022.  He was tested positive with COVID on that Friday.  We held his day 6 and 7 of azacitidine.  He also took venetoclax only for 5 days as he was on Paxlovid. - His bone marrow biopsy was delayed due to COVID. - He still has some lingering cough although it has improved significantly. - Labs from today shows normal LFTs and creatinine.  CBC shows white count improved to 3.3 with normal ANC.  Platelet count is 209. - He has bone marrow biopsy scheduled on 06/05/2022.  Hence I will hold his next cycle today. - I will see him back on 06/10/2022 to start his next cycle after discussing biopsy results. - Will consider decreasing the azacitidine to 5 days based on biopsy findings.   Unprovoked left subclavian DVT: - Xarelto was discontinued due to hemorrhoidal bleeding.  No evidence of recurrence at this time.   3.  Constipation: - Continue Colace and stimulant once daily along with MiraLAX.  Use lactulose as needed.  No orders of the defined types were placed in this encounter.     I,Alexis Herring,acting as a Education administrator for [employer], MD.,have documented all relevant documentation on the behalf of [name], MD,as directed by  [name], MD while in the presence of [name], MD.   I, [name] MD, have reviewed the above documentation for accuracy and completeness, and I agree with  the above.   [name], MD   3/11/20244:30 PM  CHIEF COMPLAINT:   Diagnosis: MDS with EB 2    Cancer Staging  No matching staging information was found for the patient.   Prior Therapy: none  Current Therapy:  Azacitidine IV D1-7 q28d (started 06/18/21    HISTORY OF PRESENT ILLNESS:   Oncology History  Myelodysplastic syndrome ([place])  06/17/2021 Initial Diagnosis   Myelodysplastic syndrome ([place])   07/30/2021 - 10/30/2021 Chemotherapy   Patient is on Treatment Plan : MYELODYSPLASIA  Azacitidine IV D1-7 q28d     07/30/2021 -  Chemotherapy   Patient is on Treatment Plan : MYELODYSPLASIA  Azacitidine IV D1-7 q28d        INTERVAL HISTORY:   Chad Lamb is a 77 y.o. male presenting to clinic today for follow up of MDS with EB 2. He was last seen by me on 04/22/22.  Today, he states that he is doing well overall. His appetite level is at 100%. His energy level is at 75%. He denies any fevers, or night sweats.  He had COVID infection on 05/03/2022.  We had to hold his day 6 and 7 of azacitidine and the rest of his venetoclax.  He still has some lingering cough although improved tremendously.   PAST MEDICAL HISTORY:   Past Medical History: Past Medical History:  Diagnosis Date   Allergy    Anxiety    Arthritis    Asthma    Cataract    Clotting disorder ([place])    DVT (deep venous thrombosis) ([place])    unknown etiology. [facility]   GERD (gastroesophageal reflux disease)    History of dental surgery    feb 2023   Hyperlipidemia    Hypertension    MDS (myelodysplastic syndrome), high grade ([place]) 06/17/2021   Pneumonia    Port-A-Cath in place 07/30/2021    Surgical History: Past Surgical History:  Procedure Laterality Date   CATARACT EXTRACTION W/PHACO Right 11/24/2017   Procedure: CATARACT EXTRACTION PHACO AND INTRAOCULAR LENS PLACEMENT RIGHT EYE;  Surgeon: [name], MD;  Location: [facility];  Service: Ophthalmology;  Laterality: Right;  CDE: 8.61   CATARACT EXTRACTION  W/PHACO Left 12/29/2017   Procedure: CATARACT EXTRACTION PHACO AND INTRAOCULAR LENS PLACEMENT (IOC);  Surgeon: [name], MD;  Location: [facility];  Service: Ophthalmology;  Laterality: Left;  CDE: 7.21   GANGLION CYST EXCISION     HERNIA REPAIR     bilateral   PARS PLANA VITRECTOMY Left 05/17/2021   Procedure: PARS PLANA VITRECTOMY 25 GAUGE WITH ANTIBIOTIC INJECTION  AND ENDOLASER LEFT EYE;  Surgeon: [name], MD;  Location: [facility];  Service: Ophthalmology;  Laterality: Left;   PORTACATH PLACEMENT Right 07/09/2021   Procedure: INSERTION PORT-A-CATH;  Surgeon: Aviva Signs, MD;  Location: [facility];  Service: General;  Laterality: Right;   TONSILLECTOMY     VASCULAR SURGERY      Social History: Social History   Socioeconomic History   Marital status: Married  Spouse name: [name]   Number of children: 0   Years of education: 13   Highest education level: High school graduate  Occupational History   Not on file  Tobacco Use   Smoking status: Former    Types: Pipe    Quit date: 03/21/1966    Years since quitting: 56.2   Smokeless tobacco: Never  Vaping Use   Vaping Use: Never used  Substance and Sexual Activity   Alcohol use: Yes    Alcohol/week: 1.0 standard drink of alcohol    Types: 1 Cans of beer per week    Comment: 1 can of beer a week   Drug use: Never   Sexual activity: Not Currently  Other Topics Concern   Not on file  Social History Narrative   ** Merged History Encounter **       Grew up in [place], [place] and [place].  Retired.  Worked in [place]. 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    Social Determinants of Health   Financial Resource Strain: Low Risk  (01/23/2017)   Overall Financial Resource Strain (CARDIA)    Difficulty of Paying Living Expenses: Not hard at all  Food Insecurity: No Food Insecurity (01/23/2017)   Hunger Vital Sign    Worried About Running Out of Food in the Last Year: Never true     Ran Out of Food in the Last Year: Never true  Transportation Needs: No Transportation Needs (01/23/2017)   PRAPARE - Hydrologist (Medical): No    Lack of Transportation (Non-Medical): No  Physical Activity: Unknown (01/23/2017)   Exercise Vital Sign    Days of Exercise per Week: 3 days    Minutes of Exercise per Session: Not on file  Stress: Stress Concern Present (01/23/2017)   [place]    Feeling of Stress : To some extent  Social Connections: Somewhat Isolated (01/23/2017)   Social Connection and Isolation Panel [NHANES]    Frequency of Communication with Friends and Family: More than three times a week    Frequency of Social Gatherings with Friends and Family: More than three times a week    Attends Religious Services: Never    Marine scientist or Organizations: No    Attends Archivist Meetings: Never    Marital Status: Married  Human resources officer Violence: Not At Risk (01/23/2017)   Humiliation, Afraid, Rape, and Kick questionnaire    Fear of Current or Ex-Partner: No    Emotionally Abused: No    Physically Abused: No    Sexually Abused: No    Family History: Family History  Problem Relation Age of Onset   Cancer Mother        pancreatic   Arthritis Father    Early death Father 16       Lung infiltrate   Colon cancer Neg Hx    Colon polyps Neg Hx     Current Medications:  Current Outpatient Medications:    albuterol (PROVENTIL HFA;VENTOLIN HFA) 108 (90 Base) MCG/ACT inhaler, Inhale 1-2 puffs into the lungs every 6 (six) hours as needed for wheezing or shortness of breath., Disp: , Rfl:    ALPRAZolam (XANAX) 0.5 MG tablet, Take 0.5 mg by mouth 2 (two) times daily as needed for anxiety., Disp: , Rfl:    amLODipine (NORVASC) 2.5 MG tablet, Take 2.5 mg by mouth daily., Disp: , Rfl:  azaCITIDine 5 mg/2 mLs in lactated ringers infusion, Inject into the vein daily.  Days 1-7 every 28 days, Disp: , Rfl:    DENTA 5000 PLUS 1.1 % CREA dental cream, Take by mouth., Disp: , Rfl:    fluticasone furoate-vilanterol (BREO ELLIPTA) 200-25 MCG/ACT AEPB, Inhale 1 puff into the lungs daily., Disp: , Rfl:    Lactulose 20 GM/30ML SOLN, Take 30 mLs (20 g total) by mouth at bedtime., Disp: 473 mL, Rfl: 3   Lidocaine-Hydrocort, Perianal, 3-0.5 % CREA, Apply topically 2 (two) times daily as needed., Disp: , Rfl:    Lidocaine-Hydrocortisone Ace 3-0.5 % CREA, Apply 1 application topically as directed. (Patient taking differently: Apply 1 application  topically 2 (two) times daily as needed (hemorrhoids).), Disp: 30 Tube, Rfl: 11   lidocaine-prilocaine (EMLA) cream, Apply a SMALL AMOUNT TO port a cath site (DO not RUB in) AND cover with PLASTIC WRAP ONE hour prior TO infusion appointment, Disp: 30 g, Rfl: 3   loratadine (CLARITIN) 10 MG tablet, Take 10 mg by mouth daily., Disp: , Rfl:    Lutein 40 MG CAPS, Take 40 mg by mouth daily., Disp: , Rfl:    Multiple Vitamins-Minerals (CENTRUM ADULTS PO), Take 1 tablet by mouth daily., Disp: , Rfl:    nystatin cream (MYCOSTATIN), Apply 1 application. topically 2 (two) times daily as needed for dry skin., Disp: , Rfl:    ofloxacin (OCUFLOX) 0.3 % ophthalmic solution, Place 1 drop into the left eye See admin instructions. 1 drop to left eye 4 x daily for 7 days after monthly procedure., Disp: , Rfl:    omeprazole (PRILOSEC OTC) 20 MG tablet, Take 5-10 mg by mouth daily., Disp: , Rfl:    sildenafil (VIAGRA) 50 MG tablet, Take 50 mg by mouth daily as needed for erectile dysfunction., Disp: , Rfl:    traMADol (ULTRAM) 50 MG tablet, Take 1 tablet (50 mg total) by mouth every 6 (six) hours as needed., Disp: 15 tablet, Rfl: 0   venetoclax (VENCLEXTA) 100 MG tablet, Take 2 tablets (200 mg total) by mouth daily. Take for 14 days, then hold for 14 days. Repeat every 28 days. Take with a meal and a full glass of water., Disp: 28 tablet, Rfl: 0    Allergies: Allergies  Allergen Reactions   Cephalosporins Itching   Amoxapine And Related Itching and Rash    Has patient had a PCN reaction causing immediate rash, facial/tongue/throat swelling, SOB or lightheadedness with hypotension: No Has patient had a PCN reaction causing severe rash involving mucus membranes or skin necrosis: No Has patient had a PCN reaction that required hospitalization: No Has patient had a PCN reaction occurring within the last 10 years: No If all of the above answers are "NO", then may proceed with Cephalosporin use.     Amoxicillin Rash   Cephalexin Rash   Clindamycin/Lincomycin Itching and Rash   Sulfamethoxazole Itching and Rash   Trimethoprim Rash    REVIEW OF SYSTEMS:   Review of Systems  Constitutional:  Negative for chills, fatigue and fever.  HENT:   Negative for lump/mass, mouth sores, nosebleeds, sore throat and trouble swallowing.   Eyes:  Negative for eye problems.  Respiratory:  Positive for cough. Negative for shortness of breath.   Cardiovascular:  Negative for chest pain, leg swelling and palpitations.  Gastrointestinal:  Negative for abdominal pain, constipation, diarrhea, nausea and vomiting.  Genitourinary:  Negative for bladder incontinence, difficulty urinating, dysuria, frequency, hematuria and nocturia.   Musculoskeletal:  Negative for arthralgias, back pain, flank pain, myalgias and neck pain.  Skin:  Negative for itching and rash.  Neurological:  Negative for dizziness, headaches and numbness.  Hematological:  Does not bruise/bleed easily.  Psychiatric/Behavioral:  Negative for depression, sleep disturbance and suicidal ideas. The patient is not nervous/anxious.   All other systems reviewed and are negative.    VITALS:   There were no vitals taken for this visit.  Wt Readings from Last 3 Encounters:  05/27/22 153 lb (69.4 kg)  05/01/22 154 lb 6.4 oz (70 kg)  04/29/22 154 lb 4.8 oz (70 kg)    There is no height or  weight on file to calculate BMI.  Performance status (ECOG): 1 - Symptomatic but completely ambulatory  PHYSICAL EXAM:   Physical Exam Vitals and nursing note reviewed. Exam conducted with a chaperone present.  Constitutional:      Appearance: Normal appearance.  Cardiovascular:     Rate and Rhythm: Normal rate and regular rhythm.     Pulses: Normal pulses.     Heart sounds: Normal heart sounds.  Pulmonary:     Effort: Pulmonary effort is normal.     Breath sounds: Normal breath sounds.  Abdominal:     Palpations: Abdomen is soft. There is no hepatomegaly, splenomegaly or mass.     Tenderness: There is no abdominal tenderness.  Musculoskeletal:     Right lower leg: No edema.     Left lower leg: No edema.  Lymphadenopathy:     Cervical: No cervical adenopathy.     Right cervical: No superficial, deep or posterior cervical adenopathy.    Left cervical: No superficial, deep or posterior cervical adenopathy.     Upper Body:     Right upper body: No supraclavicular or axillary adenopathy.     Left upper body: No supraclavicular or axillary adenopathy.  Neurological:     General: No focal deficit present.     Mental Status: He is alert and oriented to person, place, and time.  Psychiatric:        Mood and Affect: Mood normal.        Behavior: Behavior normal.     LABS:      Latest Ref Rng & Units 05/27/2022   11:01 AM 04/29/2022   12:34 PM 04/22/2022   10:39 AM  CBC  WBC 4.0 - 10.5 K/uL 3.3  2.0  1.5   Hemoglobin 13.0 - 17.0 g/dL 11.3  11.0  10.8   Hematocrit 39.0 - 52.0 % 35.2  34.0  33.0   Platelets 150 - 400 K/uL 209  279  278       Latest Ref Rng & Units 05/27/2022   11:01 AM 04/29/2022    1:51 PM 04/29/2022   12:34 PM  CMP  Glucose 70 - 99 mg/dL 126   110   BUN 8 - 23 mg/dL 14   15   Creatinine 0.61 - 1.24 mg/dL 0.99   0.86   Sodium 135 - 145 mmol/L 136   139   Potassium 3.5 - 5.1 mmol/L 3.7   3.8   Chloride 98 - 111 mmol/L 104   106   CO2 22 - 32 mmol/L 26    25   Calcium 8.9 - 10.3 mg/dL 8.9   9.5   Total Protein 6.5 - 8.1 g/dL 5.9  6.2    Total Bilirubin 0.3 - 1.2 mg/dL 1.5  1.0    Alkaline Phos 38 - 126 U/L 65  66  AST 15 - 41 U/L 20  23    ALT 0 - 44 U/L 14  15       No results found for: "CEA1", "CEA" / No results found for: "CEA1", "CEA" No results found for: "PSA1" No results found for: "[id number]" No results found for: "CAN125"  Lab Results  Component Value Date   TOTALPROTELP 6.3 01/05/2021   ALBUMINELP 3.3 01/05/2021   A1GS 0.3 01/05/2021   A2GS 0.8 01/05/2021   BETS 1.0 01/05/2021   GAMS 1.0 01/05/2021   MSPIKE Not Observed 01/05/2021   SPEI Comment 01/05/2021   No results found for: "TIBC", "FERRITIN", "IRONPCTSAT" Lab Results  Component Value Date   LDH 147 11/20/2021   LDH 143 10/22/2021   LDH 144 10/16/2021     STUDIES:   No results found.

## 2022-05-27 NOTE — Progress Notes (Signed)
No treatment today due to upcoming bone marrow biopsy.  Patients port flushed without difficulty.  Good blood return noted with no bruising or swelling noted at site.  Band aid applied.  VSS with discharge and left in satisfactory condition with no s/s of distress noted.

## 2022-05-28 ENCOUNTER — Inpatient Hospital Stay: Payer: Medicare HMO

## 2022-05-28 ENCOUNTER — Ambulatory Visit: Payer: Medicare HMO

## 2022-05-29 ENCOUNTER — Inpatient Hospital Stay: Payer: Medicare HMO

## 2022-05-30 ENCOUNTER — Inpatient Hospital Stay: Payer: Medicare HMO

## 2022-05-31 ENCOUNTER — Inpatient Hospital Stay: Payer: Medicare HMO

## 2022-06-03 ENCOUNTER — Inpatient Hospital Stay: Payer: Medicare HMO

## 2022-06-04 ENCOUNTER — Other Ambulatory Visit: Payer: Self-pay | Admitting: Radiology

## 2022-06-04 ENCOUNTER — Inpatient Hospital Stay: Payer: Medicare HMO

## 2022-06-04 NOTE — H&P (Signed)
Referring Physician(s): Katragadda,Sreedhar  Supervising Physician: Oley Balm  Patient Status:  Chad Lamb OP  Chief Complaint:  "I'm here for a bone marrow biopsy"  Subjective: Patient familiar to IR service from bone marrow biopsies on 06/04/2021 and 01/04/2022.  He has a history of MDS with EB 2.  He presents today for follow-up image guided bone marrow biopsy for further evaluation/assess treatment response. Additional med hx as below. He denies fever,HA,CP,dyspnea, cough, abd/back pain,N/V or bleeding.      Past Medical History:  Diagnosis Date   Allergy    Anxiety    Arthritis    Asthma    Cataract    Clotting disorder ([facility])    DVT (deep venous thrombosis) ([facility])    unknown etiology. [facility]   GERD (gastroesophageal reflux disease)    History of dental surgery    feb 2023   Hyperlipidemia    Hypertension    MDS (myelodysplastic syndrome), high grade ([facility]) 06/17/2021   Pneumonia    Port-A-Cath in place 07/30/2021   Past Surgical History:  Procedure Laterality Date   CATARACT EXTRACTION W/PHACO Right 11/24/2017   Procedure: CATARACT EXTRACTION PHACO AND INTRAOCULAR LENS PLACEMENT RIGHT EYE;  Surgeon: Gemma Payor, MD;  Location: [facility];  Service: Ophthalmology;  Laterality: Right;  CDE: 8.61   CATARACT EXTRACTION W/PHACO Left 12/29/2017   Procedure: CATARACT EXTRACTION PHACO AND INTRAOCULAR LENS PLACEMENT (IOC);  Surgeon: Gemma Payor, MD;  Location: [facility];  Service: Ophthalmology;  Laterality: Left;  CDE: 7.21   GANGLION CYST EXCISION     HERNIA REPAIR     bilateral   PARS PLANA VITRECTOMY Left 05/17/2021   Procedure: PARS PLANA VITRECTOMY 25 GAUGE WITH ANTIBIOTIC INJECTION  AND ENDOLASER LEFT EYE;  Surgeon: Carmela Rima, MD;  Location: [facility];  Service: Ophthalmology;  Laterality: Left;   PORTACATH PLACEMENT Right 07/09/2021   Procedure: INSERTION PORT-A-CATH;  Surgeon: Franky Macho, MD;  Location: [facility];  Service: General;  Laterality: Right;   TONSILLECTOMY      VASCULAR SURGERY        Allergies: Cephalosporins, Amoxapine and related, Amoxicillin, Cephalexin, Clindamycin/lincomycin, Sulfamethoxazole, and Trimethoprim  Medications: Prior to Admission medications   Medication Sig Start Date End Date Taking? Authorizing Provider  albuterol (PROVENTIL HFA;VENTOLIN HFA) 108 (90 Base) MCG/ACT inhaler Inhale 1-2 puffs into the lungs every 6 (six) hours as needed for wheezing or shortness of breath.    [provider]  ALPRAZolam Prudy Feeler) 0.5 MG tablet Take 0.5 mg by mouth 2 (two) times daily as needed for anxiety.    [provider]  amLODipine (NORVASC) 2.5 MG tablet Take 2.5 mg by mouth daily. 12/06/20   [provider]  azaCITIDine 5 mg/2 mLs in lactated ringers infusion Inject into the vein daily. Days 1-7 every 28 days 07/30/21   [provider]  DENTA 5000 PLUS 1.1 % CREA dental cream Take by mouth. 04/02/22   [provider]  fluticasone furoate-vilanterol (BREO ELLIPTA) 200-25 MCG/ACT AEPB Inhale 1 puff into the lungs daily.    [provider]  Lactulose 20 GM/30ML SOLN Take 30 mLs (20 g total) by mouth at bedtime. 03/04/22   Doreatha Massed, MD  Lidocaine-Hydrocort, Perianal, 3-0.5 % CREA Apply topically 2 (two) times daily as needed. 11/13/21   [provider]  Lidocaine-Hydrocortisone Ace 3-0.5 % CREA Apply 1 application topically as directed. Patient taking differently: Apply 1 application  topically 2 (two) times daily as needed (hemorrhoids). 08/19/17   Aliene Beams, MD  lidocaine-prilocaine (EMLA) cream Apply a SMALL AMOUNT TO port a cath site (DO not RUB in) AND cover with PLASTIC WRAP ONE hour prior TO infusion appointment 05/23/22   Doreatha Massed, MD  loratadine (CLARITIN) 10 MG tablet Take 10 mg by mouth daily. 01/17/21   [provider]  Lutein 40 MG CAPS Take 40 mg by mouth daily.    [provider]  Multiple Vitamins-Minerals (CENTRUM ADULTS PO)  Take 1 tablet by mouth daily.    [provider]  nystatin cream (MYCOSTATIN) Apply 1 application. topically 2 (two) times daily as needed for dry skin.    [provider]  ofloxacin (OCUFLOX) 0.3 % ophthalmic solution Place 1 drop into the left eye See admin instructions. 1 drop to left eye 4 x daily for 7 days after monthly procedure.    [provider]  omeprazole (PRILOSEC OTC) 20 MG tablet Take 5-10 mg by mouth daily.    [provider]  sildenafil (VIAGRA) 50 MG tablet Take 50 mg by mouth daily as needed for erectile dysfunction.    [provider]  traMADol (ULTRAM) 50 MG tablet Take 1 tablet (50 mg total) by mouth every 6 (six) hours as needed. 07/09/21   Franky Macho, MD  venetoclax (VENCLEXTA) 100 MG tablet Take 2 tablets (200 mg total) by mouth daily. Take for 14 days, then hold for 14 days. Repeat every 28 days. Take with a meal and a full glass of water. 05/21/22   Doreatha Massed, MD  prochlorperazine (COMPAZINE) 10 MG tablet Take 1 tablet (10 mg total) by mouth every 6 (six) hours as needed (Nausea or vomiting). 07/30/21 11/19/21  Doreatha Massed, MD     Vital Signs: temp 98.5  BP 128/84  HR 79  R 18  O2 SATS 99% RA       Code Status: DNR  Physical Exam : awake/alert; chest- CTA bilat; clean, intact rt chest wall port a cath: heart- RRR; abd- soft,+BS,NT; no LE edema  Imaging: No results found.  Labs:  CBC: Recent Labs    04/12/22 1010 04/22/22 1039 04/29/22 1234 05/27/22 1101  WBC 1.5* 1.5* 2.0* 3.3*  HGB 10.1* 10.8* 11.0* 11.3*  HCT 30.7* 33.0* 34.0* 35.2*  PLT 92* 278 279 209    COAGS: No results for input(s): "INR", "APTT" in the last 8760 hours.  BMP: Recent Labs    04/12/22 1010 04/22/22 1039 04/29/22 1234 05/27/22 1101  NA 138 139 139 136  K 3.7 3.6 3.8 3.7  CL 107 105 106 104  CO2 23 25 25 26   GLUCOSE 118* 122* 110* 126*  BUN 14 15 15 14   CALCIUM 9.1 9.2 9.5 8.9  CREATININE 0.86 0.88 0.86  0.99  GFRNONAA >60 >60 >60 >60    LIVER FUNCTION TESTS: Recent Labs    03/25/22 0939 04/12/22 1010 04/29/22 1351 05/27/22 1101  BILITOT 1.1 0.8 1.0 1.5*  AST 16 23 23 20   ALT 13 16 15 14   ALKPHOS 69 65 66 65  PROT 6.0* 5.6* 6.2* 5.9*  ALBUMIN 3.3* 3.3* 3.8 3.7    Assessment and Plan: Patient familiar to IR service from bone marrow biopsies on 06/04/2021 and 01/04/2022.  He has a history of MDS with EB 2.  He presents today for follow-up image guided bone marrow biopsy for further evaluation/assess treatment response. PMH also sig for left subclavian DVT, asthma, GERD, HLD, HTN, arthritis. Risks and benefits of procedure was discussed with the patient /spouse including, but not limited  to bleeding, infection, damage to adjacent structures or low yield requiring additional tests.  All of the questions were answered and there is agreement to proceed.  Consent signed and in chart.    Electronically Signed: D. Jeananne Rama, PA-C 06/04/2022, 2:43 PM   I spent a total of 20 minutes at the the patient's bedside AND on the patient's hospital floor or unit, greater than 50% of which was counseling/coordinating care for image guided bone marrow biopsy

## 2022-06-05 ENCOUNTER — Ambulatory Visit (HOSPITAL_COMMUNITY)
Admission: RE | Admit: 2022-06-05 | Discharge: 2022-06-05 | Disposition: A | Discharge status: Home / Self Care | Payer: Medicare HMO | Source: Ambulatory Visit | Attending: Hematology | Admitting: Hematology

## 2022-06-05 ENCOUNTER — Other Ambulatory Visit: Payer: Self-pay

## 2022-06-05 ENCOUNTER — Encounter (HOSPITAL_COMMUNITY): Payer: Self-pay

## 2022-06-05 DIAGNOSIS — E785 Hyperlipidemia, unspecified: Secondary | ICD-10-CM | POA: Diagnosis not present

## 2022-06-05 DIAGNOSIS — K219 Gastro-esophageal reflux disease without esophagitis: Secondary | ICD-10-CM | POA: Diagnosis not present

## 2022-06-05 DIAGNOSIS — I10 Essential (primary) hypertension: Secondary | ICD-10-CM | POA: Insufficient documentation

## 2022-06-05 DIAGNOSIS — D469 Myelodysplastic syndrome, unspecified: Secondary | ICD-10-CM

## 2022-06-05 DIAGNOSIS — Z86718 Personal history of other venous thrombosis and embolism: Secondary | ICD-10-CM | POA: Insufficient documentation

## 2022-06-05 DIAGNOSIS — J45909 Unspecified asthma, uncomplicated: Secondary | ICD-10-CM | POA: Insufficient documentation

## 2022-06-05 HISTORY — PX: IR BONE MARROW BIOPSY & ASPIRATION: IMG5727

## 2022-06-05 LAB — CBC WITH DIFFERENTIAL/PLATELET
Abs Immature Granulocytes: 0.01 10*3/uL (ref 0.00–0.07)
Basophils Absolute: 0 10*3/uL (ref 0.0–0.1)
Basophils Relative: 1 %
Eosinophils Absolute: 0.4 10*3/uL (ref 0.0–0.5)
Eosinophils Relative: 9 %
HCT: 35.4 % — ABNORMAL LOW (ref 39.0–52.0)
Hemoglobin: 11.2 g/dL — ABNORMAL LOW (ref 13.0–17.0)
Immature Granulocytes: 0 %
Lymphocytes Relative: 28 %
Lymphs Abs: 1.3 10*3/uL (ref 0.7–4.0)
MCH: 27.5 pg (ref 26.0–34.0)
MCHC: 31.6 g/dL (ref 30.0–36.0)
MCV: 86.8 fL (ref 80.0–100.0)
Monocytes Absolute: 0.7 10*3/uL (ref 0.1–1.0)
Monocytes Relative: 15 %
Neutro Abs: 2.2 10*3/uL (ref 1.7–7.7)
Neutrophils Relative %: 47 %
Platelets: 228 10*3/uL (ref 150–400)
RBC: 4.08 MIL/uL — ABNORMAL LOW (ref 4.22–5.81)
RDW: 16.2 % — ABNORMAL HIGH (ref 11.5–15.5)
WBC: 4.6 10*3/uL (ref 4.0–10.5)
nRBC: 0 % (ref 0.0–0.2)

## 2022-06-05 MED ORDER — MIDAZOLAM HCL 2 MG/2ML IJ SOLN
INTRAMUSCULAR | Status: AC | PRN
  Administered 2022-06-05 (×3): 1 mg via INTRAVENOUS

## 2022-06-05 MED ORDER — FENTANYL CITRATE (PF) 100 MCG/2ML IJ SOLN
INTRAMUSCULAR | Status: AC
  Filled 2022-06-05: qty 2

## 2022-06-05 MED ORDER — MIDAZOLAM HCL 2 MG/2ML IJ SOLN
INTRAMUSCULAR | Status: AC
  Filled 2022-06-05: qty 2

## 2022-06-05 MED ORDER — HYDROCODONE-ACETAMINOPHEN 5-325 MG PO TABS: 1.0000 | ORAL_TABLET | ORAL | Status: DC | PRN

## 2022-06-05 MED ORDER — FENTANYL CITRATE (PF) 100 MCG/2ML IJ SOLN
INTRAMUSCULAR | Status: AC | PRN
  Administered 2022-06-05 (×2): 50 ug via INTRAVENOUS

## 2022-06-05 MED ORDER — LIDOCAINE HCL (PF) 1 % IJ SOLN
INTRAMUSCULAR | Status: AC
  Administered 2022-06-05: 10 mL
  Filled 2022-06-05: qty 30

## 2022-06-05 MED ORDER — SODIUM CHLORIDE 0.9 % IV SOLN: INTRAVENOUS | Status: DC

## 2022-06-05 NOTE — Procedures (Signed)
  Procedure:  FLuoro guided bone marrow biopsy L iliac Preprocedure diagnosis: The encounter diagnosis was Myelodysplastic syndrome ([place]). Postprocedure diagnosis: same EBL:    minimal Complications:   none immediate  See full dictation in [facility].  Dillard Cannon MD Main # [phone] Pager  [phone] Mobile [phone]

## 2022-06-05 NOTE — Discharge Instructions (Addendum)
Discharge Instructions:   Please call Interventional Radiology clinic [phone] with any questions or concerns.  You may remove your dressing and shower tomorrow.   Bone Marrow Aspiration and Bone Marrow Biopsy, Adult, Care After This sheet gives you information about how to care for yourself after your procedure. Your health care provider may also give you more specific instructions. If you have problems or questions, contact your health care provider. What can I expect after the procedure? After the procedure, it is common to have: Mild pain and tenderness. Swelling. Bruising. Follow these instructions at home: Puncture site care  Follow instructions from your health care provider about how to take care of the puncture site. Make sure you: Wash your hands with soap and water before and after you change your bandage (dressing). If soap and water are not available, use hand sanitizer. Change your dressing as told by your health care provider. Check your puncture site every day for signs of infection. Check for: More redness, swelling, or pain. Fluid or blood. Warmth. Pus or a bad smell. Activity Return to your normal activities as told by your health care provider. Ask your health care provider what activities are safe for you. Do not lift anything that is heavier than 10 lb (4.5 kg), or the limit that you are told, until your health care provider says that it is safe. Do not drive for 24 hours if you were given a sedative during your procedure. General instructions  Take over-the-counter and prescription medicines only as told by your health care provider. Do not take baths, swim, or use a hot tub until your health care provider approves. Ask your health care provider if you may take showers. You may only be allowed to take sponge baths. If directed, put ice on the affected area. To do this: Put ice in a plastic bag. Place a towel between your skin and the bag. Leave the ice  on for 20 minutes, 2-3 times a day. Keep all follow-up visits as told by your health care provider. This is important. Contact a health care provider if: Your pain is not controlled with medicine. You have a fever. You have more redness, swelling, or pain around the puncture site. You have fluid or blood coming from the puncture site. Your puncture site feels warm to the touch. You have pus or a bad smell coming from the puncture site. Summary After the procedure, it is common to have mild pain, tenderness, swelling, and bruising. Follow instructions from your health care provider about how to take care of the puncture site and what activities are safe for you. Take over-the-counter and prescription medicines only as told by your health care provider. Contact a health care provider if you have any signs of infection, such as fluid or blood coming from the puncture site. This information is not intended to replace advice given to you by your health care provider. Make sure you discuss any questions you have with your health care provider. Document Revised: 07/21/2018 Document Reviewed: 07/21/2018 Elsevier Patient Education  [place] not participate in activities where you could fall or become injured. Do not drive or use machinery. Do not drink alcohol. Do not take sleeping pills or medicines that cause drowsiness. Do not make important decisions or sign legal documents. Do not take care of children on your own. Eating and drinking  Follow the diet recommended by your health care provider. Drink enough fluid to keep your urine pale yellow. If  you vomit: Drink water, juice, or soup when you can drink without vomiting. Make sure you have little or no nausea before eating solid foods. General instructions Take over-the-counter and prescription medicines only as told by your health care provider. Have a responsible adult stay with you for the time you are told. It is  important to have someone help care for you until you are awake and alert. Do not smoke. Keep all follow-up visits as told by your health care provider. This is important. Contact a health care provider if: You are still sleepy or having trouble with balance after 24 hours. You feel light-headed. You keep feeling nauseous or you keep vomiting. You develop a rash. You have a fever. You have redness or swelling around the IV site. Get help right away if: You have trouble breathing. You have new-onset confusion at home. Summary After the procedure, it is common to feel sleepy, have impaired judgment, or feel nauseous if you eat too soon. Rest after you get home. Know the things you should not do after the procedure. Follow the diet recommended by your health care provider and drink enough fluid to keep your urine pale yellow. Get help right away if you have trouble breathing or new-onset confusion at home. This information is not intended to replace advice given to you by your health care provider. Make sure you discuss any questions you have with your health care provider. Document Revised: 07/02/2019 Document Reviewed: 01/28/2019 Elsevier Patient Education  [place].   Moderate Conscious Sedation, Adult, Care After This sheet gives you information about how to care for yourself after your procedure. Your health care provider may also give you more specific instructions. If you have problems or questions, contact your health care provider. What can I expect after the procedure? After the procedure, it is common to have: Sleepiness for several hours. Impaired judgment for several hours. Difficulty with balance. Vomiting if you eat too soon. Follow these instructions at home: For the time period you were told by your health care provider: Rest. Do not participate in activities where you could fall or become injured. Do not drive or use machinery. Do not drink alcohol. Do  not take sleeping pills or medicines that cause drowsiness. Do not make important decisions or sign legal documents. Do not take care of children on your own. Eating and drinking  Follow the diet recommended by your health care provider. Drink enough fluid to keep your urine pale yellow. If you vomit: Drink water, juice, or soup when you can drink without vomiting. Make sure you have little or no nausea before eating solid foods. General instructions Take over-the-counter and prescription medicines only as told by your health care provider. Have a responsible adult stay with you for the time you are told. It is important to have someone help care for you until you are awake and alert. Do not smoke. Keep all follow-up visits as told by your health care provider. This is important. Contact a health care provider if: You are still sleepy or having trouble with balance after 24 hours. You feel light-headed. You keep feeling nauseous or you keep vomiting. You develop a rash. You have a fever. You have redness or swelling around the IV site. Get help right away if: You have trouble breathing. You have new-onset confusion at home. Summary After the procedure, it is common to feel sleepy, have impaired judgment, or feel nauseous if you eat too soon. Rest after you get  home. Know the things you should not do after the procedure. Follow the diet recommended by your health care provider and drink enough fluid to keep your urine pale yellow. Get help right away if you have trouble breathing or new-onset confusion at home. This information is not intended to replace advice given to you by your health care provider. Make sure you discuss any questions you have with your health care provider. Document Revised: 07/02/2019 Document Reviewed: 01/28/2019 Elsevier Patient Education  [place].

## 2022-06-06 LAB — SURGICAL PATHOLOGY

## 2022-06-10 ENCOUNTER — Inpatient Hospital Stay: Payer: Medicare HMO

## 2022-06-10 ENCOUNTER — Inpatient Hospital Stay (HOSPITAL_BASED_OUTPATIENT_CLINIC_OR_DEPARTMENT_OTHER): Payer: Medicare HMO | Admitting: Hematology

## 2022-06-10 VITALS — BP 135/89 | HR 62 | Temp 97.9°F | Resp 16 | Wt 156.3 lb

## 2022-06-10 DIAGNOSIS — Z95828 Presence of other vascular implants and grafts: Secondary | ICD-10-CM

## 2022-06-10 DIAGNOSIS — D469 Myelodysplastic syndrome, unspecified: Secondary | ICD-10-CM | POA: Diagnosis not present

## 2022-06-10 DIAGNOSIS — Z5111 Encounter for antineoplastic chemotherapy: Secondary | ICD-10-CM | POA: Diagnosis not present

## 2022-06-10 LAB — CBC WITH DIFFERENTIAL/PLATELET
Abs Immature Granulocytes: 0.01 10*3/uL (ref 0.00–0.07)
Basophils Absolute: 0 10*3/uL (ref 0.0–0.1)
Basophils Relative: 1 %
Eosinophils Absolute: 0.5 10*3/uL (ref 0.0–0.5)
Eosinophils Relative: 11 %
HCT: 35 % — ABNORMAL LOW (ref 39.0–52.0)
Hemoglobin: 11.2 g/dL — ABNORMAL LOW (ref 13.0–17.0)
Immature Granulocytes: 0 %
Lymphocytes Relative: 24 %
Lymphs Abs: 1.1 10*3/uL (ref 0.7–4.0)
MCH: 27.7 pg (ref 26.0–34.0)
MCHC: 32 g/dL (ref 30.0–36.0)
MCV: 86.6 fL (ref 80.0–100.0)
Monocytes Absolute: 0.7 10*3/uL (ref 0.1–1.0)
Monocytes Relative: 15 %
Neutro Abs: 2.2 10*3/uL (ref 1.7–7.7)
Neutrophils Relative %: 49 %
Platelets: 228 10*3/uL (ref 150–400)
RBC: 4.04 MIL/uL — ABNORMAL LOW (ref 4.22–5.81)
RDW: 15.9 % — ABNORMAL HIGH (ref 11.5–15.5)
WBC: 4.5 10*3/uL (ref 4.0–10.5)
nRBC: 0 % (ref 0.0–0.2)

## 2022-06-10 LAB — COMPREHENSIVE METABOLIC PANEL
ALT: 15 U/L (ref 0–44)
AST: 21 U/L (ref 15–41)
Albumin: 3.8 g/dL (ref 3.5–5.0)
Alkaline Phosphatase: 66 U/L (ref 38–126)
Anion gap: 8 (ref 5–15)
BUN: 15 mg/dL (ref 8–23)
CO2: 25 mmol/L (ref 22–32)
Calcium: 9.3 mg/dL (ref 8.9–10.3)
Chloride: 105 mmol/L (ref 98–111)
Creatinine, Ser: 0.89 mg/dL (ref 0.61–1.24)
GFR, Estimated: >60 mL/min (ref >60)
Glucose, Bld: 113 mg/dL — ABNORMAL HIGH (ref 70–99)
Potassium: 3.6 mmol/L (ref 3.5–5.1)
Sodium: 138 mmol/L (ref 135–145)
Total Bilirubin: 0.9 mg/dL (ref 0.3–1.2)
Total Protein: 5.9 g/dL — ABNORMAL LOW (ref 6.5–8.1)

## 2022-06-10 LAB — MAGNESIUM: Magnesium: 2 mg/dL (ref 1.7–2.4)

## 2022-06-10 MED ORDER — SODIUM CHLORIDE 0.9% FLUSH
10.0000 mL | Freq: Once | INTRAVENOUS | Status: AC
  Administered 2022-06-10: 10 mL via INTRAVENOUS

## 2022-06-10 MED ORDER — SODIUM CHLORIDE 0.9 % IV SOLN: Freq: Once | INTRAVENOUS | Status: AC

## 2022-06-10 MED ORDER — SODIUM CHLORIDE 0.9% FLUSH
10.0000 mL | INTRAVENOUS | Status: DC | PRN
  Administered 2022-06-10: 10 mL

## 2022-06-10 MED ORDER — HEPARIN SOD (PORK) LOCK FLUSH 100 UNIT/ML IV SOLN
500.0000 [IU] | Freq: Once | INTRAVENOUS | Status: AC | PRN
  Administered 2022-06-10: 500 [IU]

## 2022-06-10 MED ORDER — PALONOSETRON HCL INJECTION 0.25 MG/5ML
0.2500 mg | Freq: Once | INTRAVENOUS | Status: AC
  Administered 2022-06-10: 0.25 mg via INTRAVENOUS
  Filled 2022-06-10: qty 5

## 2022-06-10 MED ORDER — SODIUM CHLORIDE 0.9 % IV SOLN
75.0000 mg/m2 | Freq: Once | INTRAVENOUS | Status: AC
  Administered 2022-06-10: 140 mg via INTRAVENOUS
  Filled 2022-06-10: qty 14

## 2022-06-10 MED ORDER — SODIUM CHLORIDE 0.9 % IV SOLN
10.0000 mg | Freq: Once | INTRAVENOUS | Status: AC
  Administered 2022-06-10: 10 mg via INTRAVENOUS
  Filled 2022-06-10: qty 10

## 2022-06-10 NOTE — Patient Instructions (Signed)
[facility]  Discharge Instructions: Thank you for choosing [facility] to provide your oncology and hematology care.  If you have a lab appointment with the Cancer Center, please come in thru the Main Entrance and check in at the main information desk.  Wear comfortable clothing and clothing appropriate for easy access to any Portacath or PICC line.   We strive to give you quality time with your provider. You may need to reschedule your appointment if you arrive late (15 or more minutes).  Arriving late affects you and other patients whose appointments are after yours.  Also, if you miss three or more appointments without notifying the office, you may be dismissed from the clinic at the provider's discretion.      For prescription refill requests, have your pharmacy contact our office and allow 72 hours for refills to be completed.    Today you received the following chemotherapy and/or immunotherapy agents Vidaza. Azacitidine Injection What is this medication? AZACITIDINE (ay za SITE i deen) treats blood and bone marrow cancers. It works by slowing down the growth of cancer cells. This medicine may be used for other purposes; ask your health care provider or pharmacist if you have questions. COMMON BRAND NAME(S): Vidaza What should I tell my care team before I take this medication? They need to know if you have any of these conditions: Kidney disease Liver disease Low blood cell levels, such as low white cells, platelets, or red blood cells Low levels of albumin in the blood Low levels of bicarbonate in the blood An unusual or allergic reaction to azacitidine, mannitol, other medications, foods, dyes, or preservatives If you or your partner are pregnant or trying to get pregnant Breast-feeding How should I use this medication? This medication is injected into a vein or under the skin. It is given by your care team in a hospital or clinic setting. Talk  to your care team about the use of this medication in children. While it may be prescribed for children as young as 1 month for selected conditions, precautions do apply. Overdosage: If you think you have taken too much of this medicine contact a poison control center or emergency room at once. NOTE: This medicine is only for you. Do not share this medicine with others. What if I miss a dose? Keep appointments for follow-up doses. It is important not to miss your dose. Call your care team if you are unable to keep an appointment. What may interact with this medication? Interactions are not expected. This list may not describe all possible interactions. Give your health care provider a list of all the medicines, herbs, non-prescription drugs, or dietary supplements you use. Also tell them if you smoke, drink alcohol, or use illegal drugs. Some items may interact with your medicine. What should I watch for while using this medication? Your condition will be monitored carefully while you are receiving this medication. You may need blood work while taking this medication. This medication may make you feel generally unwell. This is not uncommon as chemotherapy can affect healthy cells as well as cancer cells. Report any side effects. Continue your course of treatment even though you feel ill unless your care team tells you to stop. Other product types may be available that contain the medication azacitidine. The injection and oral products should not be used in place of one another. Talk to your care team if you have questions. This medication can cause serious side effects.   To reduce the risk, your care team may give you other medications to take before receiving this one. Be sure to follow the directions from your care team. This medication may increase your risk of getting an infection. Call your care team for advice if you get a fever, chills, sore throat, or other symptoms of a cold or flu. Do not treat  yourself. Try to avoid being around people who are sick. Avoid taking medications that contain aspirin, acetaminophen, ibuprofen, naproxen, or ketoprofen unless instructed by your care team. These medications may hide a fever. This medication may increase your risk to bruise or bleed. Call your care team if you notice any unusual bleeding. Be careful brushing or flossing your teeth or using a toothpick because you may get an infection or bleed more easily. If you have any dental work done, tell your dentist you are receiving this medication. Talk to your care team if you or your partner may be pregnant. Serious birth defects can occur if you take this medication during pregnancy and for 6 months after the last dose. You will need a negative pregnancy test before starting this medication. Contraception is recommended while taking his medication and for 6 months after the last dose. Your care team can help you find the option that works for you. If your partner can get pregnant, use a condom during sex while taking this medication and for 3 months after the last dose. Do not breastfeed while taking this medication and for 1 week after the last dose. This medication may cause infertility. Talk to your care team if you are concerned about your fertility. What side effects may I notice from receiving this medication? Side effects that you should report to your care team as soon as possible: Allergic reactions--skin rash, itching, hives, swelling of the face, lips, tongue, or throat Infection--fever, chills, cough, sore throat, wounds that don't heal, pain or trouble when passing urine, general feeling of discomfort or being unwell Kidney injury--decrease in the amount of urine, swelling of the ankles, hands, or feet Liver injury--right upper belly pain, loss of appetite, nausea, light-colored stool, dark yellow or [place] [name], yellowing skin or eyes, unusual weakness or fatigue Low red blood cell  level--unusual weakness or fatigue, dizziness, headache, trouble breathing Tumor lysis syndrome (TLS)--nausea, vomiting, diarrhea, decrease in the amount of urine, dark urine, unusual weakness or fatigue, confusion, muscle pain or cramps, fast or irregular heartbeat, joint pain Unusual bruising or bleeding Side effects that usually do not require medical attention (report to your care team if they continue or are bothersome): Constipation Diarrhea Nausea Pain, redness, or irritation at injection site Vomiting This list may not describe all possible side effects. Call your doctor for medical advice about side effects. You may report side effects to FDA at 1-800-FDA-1088. Where should I keep my medication? This medication is given in a hospital or clinic. It will not be stored at home. NOTE: This sheet is a summary. It may not cover all possible information. If you have questions about this medicine, talk to your doctor, pharmacist, or health care provider.  2023 Elsevier/Gold Standard (2021-07-19 00:00:00)       To help prevent nausea and vomiting after your treatment, we encourage you to take your nausea medication as directed.  BELOW ARE SYMPTOMS THAT SHOULD BE REPORTED IMMEDIATELY: *FEVER GREATER THAN 100.4 F (38 C) OR HIGHER *CHILLS OR SWEATING *NAUSEA AND VOMITING THAT IS NOT CONTROLLED WITH YOUR NAUSEA MEDICATION *UNUSUAL SHORTNESS OF BREATH *UNUSUAL   BRUISING OR BLEEDING *URINARY PROBLEMS (pain or burning when urinating, or frequent urination) *BOWEL PROBLEMS (unusual diarrhea, constipation, pain near the anus) TENDERNESS IN MOUTH AND THROAT WITH OR WITHOUT PRESENCE OF ULCERS (sore throat, sores in mouth, or a toothache) UNUSUAL RASH, SWELLING OR PAIN  UNUSUAL VAGINAL DISCHARGE OR ITCHING   Items with * indicate a potential emergency and should be followed up as soon as possible or go to the Emergency Department if any problems should occur.  Please show the CHEMOTHERAPY ALERT  CARD or IMMUNOTHERAPY ALERT CARD at check-in to the Emergency Department and triage nurse.  Should you have questions after your visit or need to cancel or reschedule your appointment, please contact [facility] [phone]  and follow the prompts.  Office hours are 8:00 a.m. to 4:30 p.m. Monday - Friday. Please note that voicemails left after 4:00 p.m. may not be returned until the following business day.  We are closed weekends and major holidays. You have access to a nurse at all times for urgent questions. Please call the main number to the clinic [phone] and follow the prompts.  For any non-urgent questions, you may also contact your provider using MyChart. We now offer e-Visits for anyone 18 and older to request care online for non-urgent symptoms. For details visit [email or web address].   Also download the MyChart app! Go to the app store, search "MyChart", open the app, select [facility], and log in with your MyChart username and password.   

## 2022-06-10 NOTE — Progress Notes (Signed)
Patient has been examined by Dr. Delton Coombes. Vital signs and labs have been reviewed by MD - ANC, Creatinine, LFTs, hemoglobin, and platelets are within treatment parameters per M.D. - pt may proceed with treatment.  Primary RN and pharmacy notified.   Patient is taking Venetoclax as prescribed.  He has not missed any doses and reports no side effects at this time.

## 2022-06-10 NOTE — Progress Notes (Signed)
Patient presents today for Vidaza infusion. Patient is in satisfactory condition with no new complaints voiced.  Vital signs are stable.  Labs reviewed by Dr. Delton Coombes during the office visit and all labs are within treatment parameters.  We will proceed with treatment per MD orders.   Patient tolerated treatment well with no complaints voiced.  Patient left ambulatory in stable condition.  Vital signs stable at discharge.  Follow up as scheduled.

## 2022-06-10 NOTE — Progress Notes (Signed)
Chad Lamb [address] [place] [address]    Clinic Day:  06/10/2022  Referring physician: [name], MD  Patient Care Team: [name], MD as PCP - General (Family Medicine) [name], MD (Family Medicine) Danie Binder, MD (Inactive) as Consulting Physician (Gastroenterology) [name], MD as Medical Oncologist (Hematology)   ASSESSMENT & PLAN:   Assessment: MDS with EB 2: - Seen at the request of Dr. Mannie Stabile for cytopenias - BMBX on 06/04/2021: Hypercellular marrow with dyspoietic changes involving the granulocytic cell line and megakaryocytes.  Myeloblasts 8% on aspirate smears and 10% by flow.  Cytogenetics 55, XY (20).  MDS FISH panel normal. 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 - IPSS-M score of 0.12, moderate high risk.  Leukemia free survival is 2.3 years.  Overall survival 2.8 years. - Cycle 1 of azacitidine on 07/30/2021.  BMBX (01/04/2022): Overall improvement in blast count and cellularity.  Chromosome analysis and FISH panel normal.  Venetoclax 200 mg day 1 through 14 added with cycle 8 on 02/18/2022. - BMBX (06/05/2022): Slightly hypercellular for age with very mild dyspoietic changes at best associated with increased number of eosinophilic cells.  No increase in blast cells identified.   Social/family history: - He lives at home with his wife.  He swims about 40 minutes daily. 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  Non-smoker. - Paternal aunt had breast cancer and mother had pancreatic cancer.  3.  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 - He underwent thrombolytic therapy at [facility]. - He will followed up with vascular at [facility] and has been on Xarelto since then.  Plan: MDS with EB 2: - We reviewed bone marrow biopsy results which showed good response.  No increase in blasts noted. - Chromosome  analysis and MDS FISH panel is pending. - Reviewed labs today which showed normal white count and platelet count.  Hemoglobin is 11.2. - Recommend decreasing weight is up to 75 mg/m x 5 days.  He will take venetoclax 200 mg days 1 through 14. - He wants to go out of town in the last week of April.  I will see him back in 6 weeks for follow-up.   Unprovoked left subclavian DVT: - Xarelto discontinued due to hemorrhoidal bleeding.  No evidence of recurrence.   3.  Constipation: - Continue Colace and MiraLAX.  Use lactulose as needed.  No orders of the defined types were placed in this encounter.   I,Alexis Herring,acting as a Education administrator for [employer], MD.,have documented all relevant documentation on the behalf of [name], MD,as directed by  [name], MD while in the presence of [name], MD.  I, [name] MD, have reviewed the above documentation for accuracy and completeness, and I agree with the above.   [name], MD   3/25/20245:10 PM  CHIEF COMPLAINT:   Diagnosis: MDS with EB 2    Cancer Staging  No matching staging information was found for the patient.   Prior Therapy: none  Current Therapy:  Azacitidine IV D1-7 q28d (started 06/18/21   HISTORY OF PRESENT ILLNESS:   Oncology History  Myelodysplastic syndrome ([place])  06/17/2021 Initial Diagnosis   Myelodysplastic syndrome ([place])   07/30/2021 - 10/30/2021 Chemotherapy   Patient is on Treatment Plan : MYELODYSPLASIA  Azacitidine IV D1-7 q28d     07/30/2021 -  Chemotherapy   Patient is on Treatment Plan : MYELODYSPLASIA  Azacitidine IV D1-7 q28d        INTERVAL HISTORY:   Chad Lamb is a 77 y.o. male presenting to clinic today for follow up of MDS with EB 2. He was last seen by me on 05/27/22.  Today, he states that he is doing very well overall. His appetite level is at 100%. His energy level is at 85%. He states that he was taking Venetoclax 100mg  BID with meals  without any intolerances during chemotherapy. He reports that his constipation has resolved. He had good relief with Miralax and only needed a few doses of lactulose. He states that his post COVID lingering cough has mostly resolved.  PAST MEDICAL HISTORY:   Past Medical History: Past Medical History:  Diagnosis Date   Allergy    Anxiety    Arthritis    Asthma    Cataract    Clotting disorder ([place])    DVT (deep venous thrombosis) ([place])    unknown etiology. [facility]   GERD (gastroesophageal reflux disease)    History of dental surgery    feb 2023   Hyperlipidemia    Hypertension    MDS (myelodysplastic syndrome), high grade ([place]) 06/17/2021   Pneumonia    Port-A-Cath in place 07/30/2021    Surgical History: Past Surgical History:  Procedure Laterality Date   CATARACT EXTRACTION W/PHACO Right 11/24/2017   Procedure: CATARACT EXTRACTION PHACO AND INTRAOCULAR LENS PLACEMENT RIGHT EYE;  Surgeon: [name], MD;  Location: [facility];  Service: Ophthalmology;  Laterality: Right;  CDE: 8.61   CATARACT EXTRACTION W/PHACO Left 12/29/2017   Procedure: CATARACT EXTRACTION PHACO AND INTRAOCULAR LENS PLACEMENT (IOC);  Surgeon: [name], MD;  Location: [facility];  Service: Ophthalmology;  Laterality: Left;  CDE: 7.21   GANGLION CYST EXCISION     HERNIA REPAIR     bilateral   IR BONE MARROW BIOPSY & ASPIRATION  06/05/2022   PARS PLANA VITRECTOMY Left 05/17/2021   Procedure: PARS PLANA VITRECTOMY 25 GAUGE WITH ANTIBIOTIC INJECTION  AND ENDOLASER LEFT EYE;  Surgeon: [name], MD;  Location: [facility];  Service: Ophthalmology;  Laterality: Left;   PORTACATH PLACEMENT Right 07/09/2021   Procedure: INSERTION PORT-A-CATH;  Surgeon: Aviva Signs, MD;  Location: [facility];  Service: General;  Laterality: Right;   TONSILLECTOMY     VASCULAR SURGERY      Social History: Social History   Socioeconomic History   Marital status: Married    Spouse name: [name]   Number of children: 0   Years of  education: 13   Highest education level: High school graduate  Occupational History   Not on file  Tobacco Use   Smoking status: Former    Types: Pipe    Quit date: 03/21/1966    Years since quitting: 56.2   Smokeless tobacco: Never  Vaping Use   Vaping Use: Never used  Substance and Sexual Activity   Alcohol use: Yes    Alcohol/week: 1.0 standard drink of alcohol    Types: 1 Cans of beer per week    Comment: 1 can of beer a week   Drug use: Never   Sexual activity: Not Currently  Other Topics Concern   Not on file  Social History Narrative   ** Merged History Encounter **       Grew up in [place], [place] and [place].  Retired.  Worked in [place]. 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    Social Determinants of Health   Financial Resource Strain: Low Risk  (01/23/2017)   Overall Financial Resource Strain (CARDIA)    Difficulty of Paying Living Expenses: Not hard at all  Food Insecurity: No Food Insecurity (01/23/2017)   Hunger Vital Sign    Worried About Running Out of Food in the Last Year: Never true    Ran Out of Food in the Last Year: Never true  Transportation Needs: No Transportation Needs (01/23/2017)   PRAPARE - Hydrologist (Medical): No    Lack of Transportation (Non-Medical): No  Physical Activity: Unknown (01/23/2017)   Exercise Vital Sign    Days of Exercise per Week: 3 days    Minutes of Exercise per Session: Not on file  Stress: Stress Concern Present (01/23/2017)   [place]    Feeling of Stress : To some extent  Social Connections: Somewhat Isolated (01/23/2017)   Social Connection and Isolation Panel [NHANES]    Frequency of Communication with Friends and Family: More than three times a week    Frequency of Social Gatherings with Friends and Family: More than three times a week    Attends Religious Services: Never     Marine scientist or Organizations: No    Attends Archivist Meetings: Never    Marital Status: Married  Human resources officer Violence: Not At Risk (01/23/2017)   Humiliation, Afraid, Rape, and Kick questionnaire    Fear of Current or Ex-Partner: No    Emotionally Abused: No    Physically Abused: No    Sexually Abused: No    Family History: Family History  Problem Relation Age of Onset   Cancer Mother        pancreatic   Arthritis Father    Early death Father 44       Lung infiltrate   Colon cancer Neg Hx    Colon polyps Neg Hx     Current Medications:  Current Outpatient Medications:    albuterol (PROVENTIL HFA;VENTOLIN HFA) 108 (90 Base) MCG/ACT inhaler, Inhale 1-2 puffs into the lungs every 6 (six) hours as needed for wheezing or shortness of breath., Disp: , Rfl:    ALPRAZolam (XANAX) 0.5 MG tablet, Take 0.5 mg by mouth 2 (two) times daily as needed for anxiety., Disp: , Rfl:    amLODipine (NORVASC) 2.5 MG tablet, Take 2.5 mg by mouth daily., Disp: , Rfl:    azaCITIDine 5 mg/2 mLs in lactated ringers infusion, Inject into the vein daily. Days 1-7 every 28 days, Disp: , Rfl:    DENTA 5000 PLUS 1.1 % CREA dental cream, Take by mouth., Disp: , Rfl:    fluticasone furoate-vilanterol (BREO ELLIPTA) 200-25 MCG/ACT AEPB, Inhale 1 puff into the lungs daily., Disp: , Rfl:    Lactulose 20 GM/30ML SOLN, Take 30 mLs (20 g total) by mouth at bedtime., Disp: 473 mL, Rfl: 3   Lidocaine-Hydrocort, Perianal, 3-0.5 % CREA, Apply topically 2 (two) times daily as needed., Disp: , Rfl:    Lidocaine-Hydrocortisone Ace 3-0.5 % CREA, Apply 1 application topically as directed. (Patient taking differently: Apply 1 application  topically 2 (two) times daily as needed (hemorrhoids).), Disp: 30 Tube, Rfl: 11   lidocaine-prilocaine (EMLA) cream, Apply a SMALL AMOUNT TO port a cath site (DO not RUB in) AND cover with PLASTIC WRAP ONE hour prior TO infusion appointment, Disp: 30 g, Rfl: 3  loratadine (CLARITIN) 10 MG tablet, Take 10 mg by mouth daily., Disp: , Rfl:    Lutein 40 MG CAPS, Take 40 mg by mouth daily., Disp: , Rfl:    Multiple Vitamins-Minerals (CENTRUM ADULTS PO), Take 1 tablet by mouth daily., Disp: , Rfl:    nystatin cream (MYCOSTATIN), Apply 1 application. topically 2 (two) times daily as needed for dry skin., Disp: , Rfl:    ofloxacin (OCUFLOX) 0.3 % ophthalmic solution, Place 1 drop into the left eye See admin instructions. 1 drop to left eye 4 x daily for 7 days after monthly procedure., Disp: , Rfl:    omeprazole (PRILOSEC OTC) 20 MG tablet, Take 5-10 mg by mouth daily., Disp: , Rfl:    sildenafil (VIAGRA) 50 MG tablet, Take 50 mg by mouth daily as needed for erectile dysfunction., Disp: , Rfl:    traMADol (ULTRAM) 50 MG tablet, Take 1 tablet (50 mg total) by mouth every 6 (six) hours as needed., Disp: 15 tablet, Rfl: 0   venetoclax (VENCLEXTA) 100 MG tablet, Take 2 tablets (200 mg total) by mouth daily. Take for 14 days, then hold for 14 days. Repeat every 28 days. Take with a meal and a full glass of water., Disp: 28 tablet, Rfl: 0 No current facility-administered medications for this visit.  Facility-Administered Medications Ordered in Other Visits:    sodium chloride flush (NS) 0.9 % injection 10 mL, 10 mL, Intracatheter, PRN, [name], MD, 10 mL at 06/10/22 1534   Allergies: Allergies  Allergen Reactions   Cephalosporins Itching   Amoxapine And Related Itching and Rash    Has patient had a PCN reaction causing immediate rash, facial/tongue/throat swelling, SOB or lightheadedness with hypotension: No Has patient had a PCN reaction causing severe rash involving mucus membranes or skin necrosis: No Has patient had a PCN reaction that required hospitalization: No Has patient had a PCN reaction occurring within the last 10 years: No If all of the above answers are "NO", then may proceed with Cephalosporin use.     Amoxicillin Rash   Cephalexin  Rash   Clindamycin/Lincomycin Itching and Rash   Sulfamethoxazole Itching and Rash   Trimethoprim Rash    REVIEW OF SYSTEMS:   Review of Systems  Constitutional:  Negative for chills, fatigue and fever.  HENT:   Negative for lump/mass, mouth sores, nosebleeds, sore throat and trouble swallowing.   Eyes:  Negative for eye problems.  Respiratory:  Negative for cough and shortness of breath.   Cardiovascular:  Negative for chest pain, leg swelling and palpitations.  Gastrointestinal:  Negative for abdominal pain, constipation, diarrhea, nausea and vomiting.  Genitourinary:  Negative for bladder incontinence, difficulty urinating, dysuria, frequency, hematuria and nocturia.   Musculoskeletal:  Negative for arthralgias, back pain, flank pain, myalgias and neck pain.  Skin:  Negative for itching and rash.  Neurological:  Negative for dizziness, headaches and numbness.  Hematological:  Does not bruise/bleed easily.  Psychiatric/Behavioral:  Negative for depression, sleep disturbance and suicidal ideas. The patient is not nervous/anxious.   All other systems reviewed and are negative.    VITALS:   There were no vitals taken for this visit.  Wt Readings from Last 3 Encounters:  06/10/22 156 lb 4.8 oz (70.9 kg)  06/05/22 153 lb (69.4 kg)  05/27/22 153 lb (69.4 kg)    There is no height or weight on file to calculate BMI.  Performance status (ECOG): 1 - Symptomatic but completely ambulatory  PHYSICAL EXAM:   Physical Exam  Vitals and nursing note reviewed. Exam conducted with a chaperone present.  Constitutional:      Appearance: Normal appearance.  Cardiovascular:     Rate and Rhythm: Normal rate and regular rhythm.     Pulses: Normal pulses.     Heart sounds: Normal heart sounds.  Pulmonary:     Effort: Pulmonary effort is normal.     Breath sounds: Normal breath sounds.  Abdominal:     Palpations: Abdomen is soft. There is no hepatomegaly, splenomegaly or mass.      Tenderness: There is no abdominal tenderness.  Musculoskeletal:     Right lower leg: No edema.     Left lower leg: No edema.  Lymphadenopathy:     Cervical: No cervical adenopathy.     Right cervical: No superficial, deep or posterior cervical adenopathy.    Left cervical: No superficial, deep or posterior cervical adenopathy.     Upper Body:     Right upper body: No supraclavicular or axillary adenopathy.     Left upper body: No supraclavicular or axillary adenopathy.  Neurological:     General: No focal deficit present.     Mental Status: He is alert and oriented to person, place, and time.  Psychiatric:        Mood and Affect: Mood normal.        Behavior: Behavior normal.     LABS:      Latest Ref Rng & Units 06/10/2022   12:14 PM 06/05/2022    7:15 AM 05/27/2022   11:01 AM  CBC  WBC 4.0 - 10.5 K/uL 4.5  4.6  3.3   Hemoglobin 13.0 - 17.0 g/dL 11.2  11.2  11.3   Hematocrit 39.0 - 52.0 % 35.0  35.4  35.2   Platelets 150 - 400 K/uL 228  228  209       Latest Ref Rng & Units 06/10/2022   12:14 PM 05/27/2022   11:01 AM 04/29/2022    1:51 PM  CMP  Glucose 70 - 99 mg/dL 113  126    BUN 8 - 23 mg/dL 15  14    Creatinine 0.61 - 1.24 mg/dL 0.89  0.99    Sodium 135 - 145 mmol/L 138  136    Potassium 3.5 - 5.1 mmol/L 3.6  3.7    Chloride 98 - 111 mmol/L 105  104    CO2 22 - 32 mmol/L 25  26    Calcium 8.9 - 10.3 mg/dL 9.3  8.9    Total Protein 6.5 - 8.1 g/dL 5.9  5.9  6.2   Total Bilirubin 0.3 - 1.2 mg/dL 0.9  1.5  1.0   Alkaline Phos 38 - 126 U/L 66  65  66   AST 15 - 41 U/L 21  20  23    ALT 0 - 44 U/L 15  14  15       No results found for: "CEA1", "CEA" / No results found for: "CEA1", "CEA" No results found for: "PSA1" No results found for: "CAN199" No results found for: "CAN125"  Lab Results  Component Value Date   TOTALPROTELP 6.3 01/05/2021   ALBUMINELP 3.3 01/05/2021   A1GS 0.3 01/05/2021   A2GS 0.8 01/05/2021   BETS 1.0 01/05/2021   GAMS 1.0 01/05/2021    MSPIKE Not Observed 01/05/2021   SPEI Comment 01/05/2021   No results found for: "TIBC", "FERRITIN", "IRONPCTSAT" Lab Results  Component Value Date   LDH 147 11/20/2021   LDH 143  10/22/2021   LDH 144 10/16/2021     STUDIES:   IR BONE MARROW BIOPSY & ASPIRATION  Result Date: 06/05/2022 CLINICAL DATA:  Myelodysplastic syndrome EXAM: FLUORO GUIDED DEEP ILIAC BONE ASPIRATION AND CORE BIOPSY TECHNIQUE: Patient was placed prone on the IR fluoro gantry and limited images through the pelvis were obtained. Appropriate skin entry site was identified. Skin site was marked, prepped with chlorhexidine, draped in usual sterile fashion, and infiltrated locally with 1% lidocaine. Intravenous Fentanyl 164mcg and Versed 3mg  were administered as conscious sedation during continuous monitoring of the patient's level of consciousness and physiological / cardiorespiratory status by the radiology RN, with a total moderate sedation time of 10 minutes. Under fluoroscopic guidance an 11-gauge Cook trocar bone needle was advanced into the left iliac bone just lateral to the sacroiliac joint. Once needle tip position was confirmed, core and aspiration samples were obtained, submitted to pathology for approval. Patient tolerated procedure well. COMPLICATIONS: COMPLICATIONS none IMPRESSION: 1. Technically successful fluoro guided left iliac bone core and aspiration biopsy. Electronically Signed   By: Lucrezia Europe M.D.   On: 06/05/2022 13:53

## 2022-06-10 NOTE — Patient Instructions (Addendum)
[place] at [facility] Discharge Instructions   You were seen and examined today by Dr. Delton Coombes.  He reviewed the results of your lab work which are normal/stable.   He reviewed the results of your bone marrow biopsy which shows improvement in your bone marrow. There were no blast cells identified.   Dr. Raliegh Ip discussed with you cutting back your Vidaza infusion schedule to 5 days every 4 weeks vs 7 days every 4 weeks. You would continue Vidaza tablets as prescribed.   We will proceed with your treatment today.   Return as scheduled.    Thank you for choosing [place] at [facility] to provide your oncology and hematology care.  To afford each patient quality time with our provider, please arrive at least 15 minutes before your scheduled appointment time.   If you have a lab appointment with the [place] please come in thru the Main Entrance and check in at the main information desk.  You need to re-schedule your appointment should you arrive 10 or more minutes late.  We strive to give you quality time with our providers, and arriving late affects you and other patients whose appointments are after yours.  Also, if you no show three or more times for appointments you may be dismissed from the clinic at the providers discretion.     Again, thank you for choosing [facility].  Our hope is that these requests will decrease the amount of time that you wait before being seen by our physicians.       _____________________________________________________________  Should you have questions after your visit to [facility], please contact our office at [phone] and follow the prompts.  Our office hours are 8:00 a.m. and 4:30 p.m. Monday - Friday.  Please note that voicemails left after 4:00 p.m. may not be returned until the following business day.  We are closed weekends and major holidays.  You do have access to a  nurse 24-7, just call the main number to the clinic [phone] and do not press any options, hold on the line and a nurse will answer the phone.    For prescription refill requests, have your pharmacy contact our office and allow 72 hours.    Due to Covid, you will need to wear a mask upon entering the hospital. If you do not have a mask, a mask will be given to you at the Main Entrance upon arrival. For doctor visits, patients may have 1 support person age 49 or older with them. For treatment visits, patients can not have anyone with them due to social distancing guidelines and our immunocompromised population.

## 2022-06-11 ENCOUNTER — Inpatient Hospital Stay: Payer: Medicare HMO

## 2022-06-11 VITALS — BP 135/82 | HR 63 | Temp 97.9°F | Resp 16

## 2022-06-11 DIAGNOSIS — Z95828 Presence of other vascular implants and grafts: Secondary | ICD-10-CM

## 2022-06-11 DIAGNOSIS — D469 Myelodysplastic syndrome, unspecified: Secondary | ICD-10-CM

## 2022-06-11 DIAGNOSIS — Z5111 Encounter for antineoplastic chemotherapy: Secondary | ICD-10-CM | POA: Diagnosis not present

## 2022-06-11 MED ORDER — SODIUM CHLORIDE 0.9 % IV SOLN: Freq: Once | INTRAVENOUS | Status: AC

## 2022-06-11 MED ORDER — HEPARIN SOD (PORK) LOCK FLUSH 100 UNIT/ML IV SOLN
500.0000 [IU] | Freq: Once | INTRAVENOUS | Status: AC | PRN
  Administered 2022-06-11: 500 [IU]

## 2022-06-11 MED ORDER — SODIUM CHLORIDE 0.9% FLUSH
10.0000 mL | INTRAVENOUS | Status: DC | PRN
  Administered 2022-06-11: 10 mL

## 2022-06-11 MED ORDER — SODIUM CHLORIDE 0.9 % IV SOLN
75.0000 mg/m2 | Freq: Once | INTRAVENOUS | Status: AC
  Administered 2022-06-11: 140 mg via INTRAVENOUS
  Filled 2022-06-11: qty 14

## 2022-06-11 MED ORDER — SODIUM CHLORIDE 0.9 % IV SOLN
10.0000 mg | Freq: Once | INTRAVENOUS | Status: AC
  Administered 2022-06-11: 10 mg via INTRAVENOUS
  Filled 2022-06-11: qty 10

## 2022-06-11 NOTE — Progress Notes (Signed)
Patient presents today for Vidaza infusion.  Patient is in satisfactory condition with no new complaints voiced.  Vital signs are stable.  Port flushed well with good blood return noted.  We will proceed with treatment per MD orders.    Patient tolerated treatment well with no complaints voiced.  Patient left ambulatory in stable condition.  Vital signs stable at discharge.  Follow up as scheduled.

## 2022-06-11 NOTE — Patient Instructions (Signed)
[facility] [place]  Discharge Instructions: Thank you for choosing [facility] to provide your oncology and hematology care.  If you have a lab appointment with the Cancer Center, please come in thru the Main Entrance and check in at the main information desk.  Wear comfortable clothing and clothing appropriate for easy access to any Portacath or PICC line.   We strive to give you quality time with your provider. You may need to reschedule your appointment if you arrive late (15 or more minutes).  Arriving late affects you and other patients whose appointments are after yours.  Also, if you miss three or more appointments without notifying the office, you may be dismissed from the clinic at the provider's discretion.      For prescription refill requests, have your pharmacy contact our office and allow 72 hours for refills to be completed.    Today you received the following chemotherapy and/or immunotherapy agents Vidaza. Azacitidine Injection What is this medication? AZACITIDINE (ay za SITE i deen) treats blood and bone marrow cancers. It works by slowing down the growth of cancer cells. This medicine may be used for other purposes; ask your health care provider or pharmacist if you have questions. COMMON BRAND NAME(S): Vidaza What should I tell my care team before I take this medication? They need to know if you have any of these conditions: Kidney disease Liver disease Low blood cell levels, such as low white cells, platelets, or red blood cells Low levels of albumin in the blood Low levels of bicarbonate in the blood An unusual or allergic reaction to azacitidine, mannitol, other medications, foods, dyes, or preservatives If you or your partner are pregnant or trying to get pregnant Breast-feeding How should I use this medication? This medication is injected into a vein or under the skin. It is given by your care team in a hospital or clinic setting. Talk  to your care team about the use of this medication in children. While it may be prescribed for children as young as 1 month for selected conditions, precautions do apply. Overdosage: If you think you have taken too much of this medicine contact a poison control center or emergency room at once. NOTE: This medicine is only for you. Do not share this medicine with others. What if I miss a dose? Keep appointments for follow-up doses. It is important not to miss your dose. Call your care team if you are unable to keep an appointment. What may interact with this medication? Interactions are not expected. This list may not describe all possible interactions. Give your health care provider a list of all the medicines, herbs, non-prescription drugs, or dietary supplements you use. Also tell them if you smoke, drink alcohol, or use illegal drugs. Some items may interact with your medicine. What should I watch for while using this medication? Your condition will be monitored carefully while you are receiving this medication. You may need blood work while taking this medication. This medication may make you feel generally unwell. This is not uncommon as chemotherapy can affect healthy cells as well as cancer cells. Report any side effects. Continue your course of treatment even though you feel ill unless your care team tells you to stop. Other product types may be available that contain the medication azacitidine. The injection and oral products should not be used in place of one another. Talk to your care team if you have questions. This medication can cause serious side effects.   To reduce the risk, your care team may give you other medications to take before receiving this one. Be sure to follow the directions from your care team. This medication may increase your risk of getting an infection. Call your care team for advice if you get a fever, chills, sore throat, or other symptoms of a cold or flu. Do not treat  yourself. Try to avoid being around people who are sick. Avoid taking medications that contain aspirin, acetaminophen, ibuprofen, naproxen, or ketoprofen unless instructed by your care team. These medications may hide a fever. This medication may increase your risk to bruise or bleed. Call your care team if you notice any unusual bleeding. Be careful brushing or flossing your teeth or using a toothpick because you may get an infection or bleed more easily. If you have any dental work done, tell your dentist you are receiving this medication. Talk to your care team if you or your partner may be pregnant. Serious birth defects can occur if you take this medication during pregnancy and for 6 months after the last dose. You will need a negative pregnancy test before starting this medication. Contraception is recommended while taking his medication and for 6 months after the last dose. Your care team can help you find the option that works for you. If your partner can get pregnant, use a condom during sex while taking this medication and for 3 months after the last dose. Do not breastfeed while taking this medication and for 1 week after the last dose. This medication may cause infertility. Talk to your care team if you are concerned about your fertility. What side effects may I notice from receiving this medication? Side effects that you should report to your care team as soon as possible: Allergic reactions--skin rash, itching, hives, swelling of the face, lips, tongue, or throat Infection--fever, chills, cough, sore throat, wounds that don't heal, pain or trouble when passing urine, general feeling of discomfort or being unwell Kidney injury--decrease in the amount of urine, swelling of the ankles, hands, or feet Liver injury--right upper belly pain, loss of appetite, nausea, light-colored stool, dark yellow or Chad [name], yellowing skin or eyes, unusual weakness or fatigue Low red blood cell  level--unusual weakness or fatigue, dizziness, headache, trouble breathing Tumor lysis syndrome (TLS)--nausea, vomiting, diarrhea, decrease in the amount of urine, dark urine, unusual weakness or fatigue, confusion, muscle pain or cramps, fast or irregular heartbeat, joint pain Unusual bruising or bleeding Side effects that usually do not require medical attention (report to your care team if they continue or are bothersome): Constipation Diarrhea Nausea Pain, redness, or irritation at injection site Vomiting This list may not describe all possible side effects. Call your doctor for medical advice about side effects. You may report side effects to FDA at 1-800-FDA-1088. Where should I keep my medication? This medication is given in a hospital or clinic. It will not be stored at home. NOTE: This sheet is a summary. It may not cover all possible information. If you have questions about this medicine, talk to your doctor, pharmacist, or health care provider.  2023 Elsevier/Gold Standard (2021-07-19 00:00:00)       To help prevent nausea and vomiting after your treatment, we encourage you to take your nausea medication as directed.  BELOW ARE SYMPTOMS THAT SHOULD BE REPORTED IMMEDIATELY: *FEVER GREATER THAN 100.4 F (38 C) OR HIGHER *CHILLS OR SWEATING *NAUSEA AND VOMITING THAT IS NOT CONTROLLED WITH YOUR NAUSEA MEDICATION *UNUSUAL SHORTNESS OF BREATH *UNUSUAL   BRUISING OR BLEEDING *URINARY PROBLEMS (pain or burning when urinating, or frequent urination) *BOWEL PROBLEMS (unusual diarrhea, constipation, pain near the anus) TENDERNESS IN MOUTH AND THROAT WITH OR WITHOUT PRESENCE OF ULCERS (sore throat, sores in mouth, or a toothache) UNUSUAL RASH, SWELLING OR PAIN  UNUSUAL VAGINAL DISCHARGE OR ITCHING   Items with * indicate a potential emergency and should be followed up as soon as possible or go to the Emergency Department if any problems should occur.  Please show the CHEMOTHERAPY ALERT  CARD or IMMUNOTHERAPY ALERT CARD at check-in to the Emergency Department and triage nurse.  Should you have questions after your visit or need to cancel or reschedule your appointment, please contact [facility] [place] [phone]  and follow the prompts.  Office hours are 8:00 a.m. to 4:30 p.m. Monday - Friday. Please note that voicemails left after 4:00 p.m. may not be returned until the following business day.  We are closed weekends and major holidays. You have access to a nurse at all times for urgent questions. Please call the main number to the clinic [phone] and follow the prompts.  For any non-urgent questions, you may also contact your provider using MyChart. We now offer e-Visits for anyone 18 and older to request care online for non-urgent symptoms. For details visit [email or web address].   Also download the MyChart app! Go to the app store, search "MyChart", open the app, select [place], and log in with your MyChart username and password.   

## 2022-06-12 ENCOUNTER — Inpatient Hospital Stay: Payer: Medicare HMO

## 2022-06-12 VITALS — BP 140/82 | HR 70 | Temp 97.7°F | Resp 18

## 2022-06-12 DIAGNOSIS — Z5111 Encounter for antineoplastic chemotherapy: Secondary | ICD-10-CM | POA: Diagnosis not present

## 2022-06-12 DIAGNOSIS — D469 Myelodysplastic syndrome, unspecified: Secondary | ICD-10-CM

## 2022-06-12 DIAGNOSIS — Z95828 Presence of other vascular implants and grafts: Secondary | ICD-10-CM

## 2022-06-12 MED ORDER — PALONOSETRON HCL INJECTION 0.25 MG/5ML
0.2500 mg | Freq: Once | INTRAVENOUS | Status: AC
  Administered 2022-06-12: 0.25 mg via INTRAVENOUS
  Filled 2022-06-12: qty 5

## 2022-06-12 MED ORDER — SODIUM CHLORIDE 0.9 % IV SOLN: Freq: Once | INTRAVENOUS | Status: AC

## 2022-06-12 MED ORDER — SODIUM CHLORIDE 0.9 % IV SOLN
75.0000 mg/m2 | Freq: Once | INTRAVENOUS | Status: AC
  Administered 2022-06-12: 140 mg via INTRAVENOUS
  Filled 2022-06-12: qty 14

## 2022-06-12 MED ORDER — SODIUM CHLORIDE 0.9 % IV SOLN
10.0000 mg | Freq: Once | INTRAVENOUS | Status: AC
  Administered 2022-06-12: 10 mg via INTRAVENOUS
  Filled 2022-06-12: qty 10

## 2022-06-12 MED ORDER — SODIUM CHLORIDE 0.9% FLUSH
10.0000 mL | INTRAVENOUS | Status: AC | PRN
  Administered 2022-06-12: 10 mL

## 2022-06-12 MED ORDER — HEPARIN SOD (PORK) LOCK FLUSH 100 UNIT/ML IV SOLN
500.0000 [IU] | Freq: Once | INTRAVENOUS | Status: AC | PRN
  Administered 2022-06-12: 500 [IU]

## 2022-06-12 NOTE — Patient Instructions (Signed)
[facility] [place]  Discharge Instructions: Thank you for choosing [facility] to provide your oncology and hematology care.  If you have a lab appointment with the Cancer Center, please come in thru the Main Entrance and check in at the main information desk.  Wear comfortable clothing and clothing appropriate for easy access to any Portacath or PICC line.   We strive to give you quality time with your provider. You may need to reschedule your appointment if you arrive late (15 or more minutes).  Arriving late affects you and other patients whose appointments are after yours.  Also, if you miss three or more appointments without notifying the office, you may be dismissed from the clinic at the provider's discretion.      For prescription refill requests, have your pharmacy contact our office and allow 72 hours for refills to be completed.    Today you received the following chemotherapy and/or immunotherapy agents Vidaza. Azacitidine Injection What is this medication? AZACITIDINE (ay za SITE i deen) treats blood and bone marrow cancers. It works by slowing down the growth of cancer cells. This medicine may be used for other purposes; ask your health care provider or pharmacist if you have questions. COMMON BRAND NAME(S): Vidaza What should I tell my care team before I take this medication? They need to know if you have any of these conditions: Kidney disease Liver disease Low blood cell levels, such as low white cells, platelets, or red blood cells Low levels of albumin in the blood Low levels of bicarbonate in the blood An unusual or allergic reaction to azacitidine, mannitol, other medications, foods, dyes, or preservatives If you or your partner are pregnant or trying to get pregnant Breast-feeding How should I use this medication? This medication is injected into a vein or under the skin. It is given by your care team in a hospital or clinic setting. Talk  to your care team about the use of this medication in children. While it may be prescribed for children as young as 1 month for selected conditions, precautions do apply. Overdosage: If you think you have taken too much of this medicine contact a poison control center or emergency room at once. NOTE: This medicine is only for you. Do not share this medicine with others. What if I miss a dose? Keep appointments for follow-up doses. It is important not to miss your dose. Call your care team if you are unable to keep an appointment. What may interact with this medication? Interactions are not expected. This list may not describe all possible interactions. Give your health care provider a list of all the medicines, herbs, non-prescription drugs, or dietary supplements you use. Also tell them if you smoke, drink alcohol, or use illegal drugs. Some items may interact with your medicine. What should I watch for while using this medication? Your condition will be monitored carefully while you are receiving this medication. You may need blood work while taking this medication. This medication may make you feel generally unwell. This is not uncommon as chemotherapy can affect healthy cells as well as cancer cells. Report any side effects. Continue your course of treatment even though you feel ill unless your care team tells you to stop. Other product types may be available that contain the medication azacitidine. The injection and oral products should not be used in place of one another. Talk to your care team if you have questions. This medication can cause serious side effects.   To reduce the risk, your care team may give you other medications to take before receiving this one. Be sure to follow the directions from your care team. This medication may increase your risk of getting an infection. Call your care team for advice if you get a fever, chills, sore throat, or other symptoms of a cold or flu. Do not treat  yourself. Try to avoid being around people who are sick. Avoid taking medications that contain aspirin, acetaminophen, ibuprofen, naproxen, or ketoprofen unless instructed by your care team. These medications may hide a fever. This medication may increase your risk to bruise or bleed. Call your care team if you notice any unusual bleeding. Be careful brushing or flossing your teeth or using a toothpick because you may get an infection or bleed more easily. If you have any dental work done, tell your dentist you are receiving this medication. Talk to your care team if you or your partner may be pregnant. Serious birth defects can occur if you take this medication during pregnancy and for 6 months after the last dose. You will need a negative pregnancy test before starting this medication. Contraception is recommended while taking his medication and for 6 months after the last dose. Your care team can help you find the option that works for you. If your partner can get pregnant, use a condom during sex while taking this medication and for 3 months after the last dose. Do not breastfeed while taking this medication and for 1 week after the last dose. This medication may cause infertility. Talk to your care team if you are concerned about your fertility. What side effects may I notice from receiving this medication? Side effects that you should report to your care team as soon as possible: Allergic reactions--skin rash, itching, hives, swelling of the face, lips, tongue, or throat Infection--fever, chills, cough, sore throat, wounds that don't heal, pain or trouble when passing urine, general feeling of discomfort or being unwell Kidney injury--decrease in the amount of urine, swelling of the ankles, hands, or feet Liver injury--right upper belly pain, loss of appetite, nausea, light-colored stool, dark yellow or Tenleigh Byer urine, yellowing skin or eyes, unusual weakness or fatigue Low red blood cell  level--unusual weakness or fatigue, dizziness, headache, trouble breathing Tumor lysis syndrome (TLS)--nausea, vomiting, diarrhea, decrease in the amount of urine, dark urine, unusual weakness or fatigue, confusion, muscle pain or cramps, fast or irregular heartbeat, joint pain Unusual bruising or bleeding Side effects that usually do not require medical attention (report to your care team if they continue or are bothersome): Constipation Diarrhea Nausea Pain, redness, or irritation at injection site Vomiting This list may not describe all possible side effects. Call your doctor for medical advice about side effects. You may report side effects to FDA at 1-800-FDA-1088. Where should I keep my medication? This medication is given in a hospital or clinic. It will not be stored at home. NOTE: This sheet is a summary. It may not cover all possible information. If you have questions about this medicine, talk to your doctor, pharmacist, or health care provider.  2023 Elsevier/Gold Standard (2021-07-19 00:00:00)       To help prevent nausea and vomiting after your treatment, we encourage you to take your nausea medication as directed.  BELOW ARE SYMPTOMS THAT SHOULD BE REPORTED IMMEDIATELY: *FEVER GREATER THAN 100.4 F (38 C) OR HIGHER *CHILLS OR SWEATING *NAUSEA AND VOMITING THAT IS NOT CONTROLLED WITH YOUR NAUSEA MEDICATION *UNUSUAL SHORTNESS OF BREATH *UNUSUAL   BRUISING OR BLEEDING *URINARY PROBLEMS (pain or burning when urinating, or frequent urination) *BOWEL PROBLEMS (unusual diarrhea, constipation, pain near the anus) TENDERNESS IN MOUTH AND THROAT WITH OR WITHOUT PRESENCE OF ULCERS (sore throat, sores in mouth, or a toothache) UNUSUAL RASH, SWELLING OR PAIN  UNUSUAL VAGINAL DISCHARGE OR ITCHING   Items with * indicate a potential emergency and should be followed up as soon as possible or go to the Emergency Department if any problems should occur.  Please show the CHEMOTHERAPY ALERT  CARD or IMMUNOTHERAPY ALERT CARD at check-in to the Emergency Department and triage nurse.  Should you have questions after your visit or need to cancel or reschedule your appointment, please contact [facility] [phone]  and follow the prompts.  Office hours are 8:00 a.m. to 4:30 p.m. Monday - Friday. Please note that voicemails left after 4:00 p.m. may not be returned until the following business day.  We are closed weekends and major holidays. You have access to a nurse at all times for urgent questions. Please call the main number to the clinic [phone] and follow the prompts.  For any non-urgent questions, you may also contact your provider using MyChart. We now offer e-Visits for anyone 18 and older to request care online for non-urgent symptoms. For details visit [email or web address].   Also download the MyChart app! Go to the app store, search "MyChart", open the app, select [place], and log in with your MyChart username and password.   

## 2022-06-12 NOTE — Progress Notes (Unsigned)
Patient presents today for Vidaza infusion.  Patient is in satisfactory condition with no new complaints voiced.  Vital signs are stable. Port accessed and flushed without difficulty.  Good blood return noted. We will proceed with treatment per MD orders.

## 2022-06-13 ENCOUNTER — Encounter (HOSPITAL_COMMUNITY): Payer: Self-pay | Admitting: Hematology

## 2022-06-13 ENCOUNTER — Other Ambulatory Visit (HOSPITAL_COMMUNITY): Payer: Self-pay

## 2022-06-13 ENCOUNTER — Inpatient Hospital Stay: Payer: Medicare HMO

## 2022-06-13 VITALS — BP 131/82 | HR 64 | Temp 97.9°F | Resp 18

## 2022-06-13 DIAGNOSIS — Z5111 Encounter for antineoplastic chemotherapy: Secondary | ICD-10-CM | POA: Diagnosis not present

## 2022-06-13 DIAGNOSIS — Z95828 Presence of other vascular implants and grafts: Secondary | ICD-10-CM

## 2022-06-13 DIAGNOSIS — D469 Myelodysplastic syndrome, unspecified: Secondary | ICD-10-CM

## 2022-06-13 MED ORDER — SODIUM CHLORIDE 0.9 % IV SOLN
75.0000 mg/m2 | Freq: Once | INTRAVENOUS | Status: AC
  Administered 2022-06-13: 140 mg via INTRAVENOUS
  Filled 2022-06-13: qty 14

## 2022-06-13 MED ORDER — SODIUM CHLORIDE 0.9% FLUSH
10.0000 mL | INTRAVENOUS | Status: DC | PRN
  Administered 2022-06-13: 10 mL

## 2022-06-13 MED ORDER — SODIUM CHLORIDE 0.9 % IV SOLN: Freq: Once | INTRAVENOUS | Status: AC

## 2022-06-13 MED ORDER — HEPARIN SOD (PORK) LOCK FLUSH 100 UNIT/ML IV SOLN
500.0000 [IU] | Freq: Once | INTRAVENOUS | Status: AC | PRN
  Administered 2022-06-13: 500 [IU]

## 2022-06-13 MED ORDER — SODIUM CHLORIDE 0.9 % IV SOLN
10.0000 mg | Freq: Once | INTRAVENOUS | Status: AC
  Administered 2022-06-13: 10 mg via INTRAVENOUS
  Filled 2022-06-13: qty 10

## 2022-06-13 NOTE — Patient Instructions (Signed)
[facility] [place]  Discharge Instructions: Thank you for choosing [facility] to provide your oncology and hematology care.  If you have a lab appointment with the Cancer Center, please come in thru the Main Entrance and check in at the main information desk.  Wear comfortable clothing and clothing appropriate for easy access to any Portacath or PICC line.   We strive to give you quality time with your provider. You may need to reschedule your appointment if you arrive late (15 or more minutes).  Arriving late affects you and other patients whose appointments are after yours.  Also, if you miss three or more appointments without notifying the office, you may be dismissed from the clinic at the provider's discretion.      For prescription refill requests, have your pharmacy contact our office and allow 72 hours for refills to be completed.    Today you received the following chemotherapy and/or immunotherapy agents Vidaza      To help prevent nausea and vomiting after your treatment, we encourage you to take your nausea medication as directed.  BELOW ARE SYMPTOMS THAT SHOULD BE REPORTED IMMEDIATELY: *FEVER GREATER THAN 100.4 F (38 C) OR HIGHER *CHILLS OR SWEATING *NAUSEA AND VOMITING THAT IS NOT CONTROLLED WITH YOUR NAUSEA MEDICATION *UNUSUAL SHORTNESS OF BREATH *UNUSUAL BRUISING OR BLEEDING *URINARY PROBLEMS (pain or burning when urinating, or frequent urination) *BOWEL PROBLEMS (unusual diarrhea, constipation, pain near the anus) TENDERNESS IN MOUTH AND THROAT WITH OR WITHOUT PRESENCE OF ULCERS (sore throat, sores in mouth, or a toothache) UNUSUAL RASH, SWELLING OR PAIN  UNUSUAL VAGINAL DISCHARGE OR ITCHING   Items with * indicate a potential emergency and should be followed up as soon as possible or go to the Emergency Department if any problems should occur.  Please show the CHEMOTHERAPY ALERT CARD or IMMUNOTHERAPY ALERT CARD at check-in to the Emergency  Department and triage nurse.  Should you have questions after your visit or need to cancel or reschedule your appointment, please contact [facility]  [phone]  and follow the prompts.  Office hours are 8:00 a.m. to 4:30 p.m. Monday - Friday. Please note that voicemails left after 4:00 p.m. may not be returned until the following business day.  We are closed weekends and major holidays. You have access to a nurse at all times for urgent questions. Please call the main number to the clinic [phone] and follow the prompts.  For any non-urgent questions, you may also contact your provider using MyChart. We now offer e-Visits for anyone 18 and older to request care online for non-urgent symptoms. For details visit [email or web address].   Also download the MyChart app! Go to the app store, search "MyChart", open the app, select [facility], and log in with your MyChart username and password.   

## 2022-06-13 NOTE — Progress Notes (Signed)
Patient presents today for Vidaza infusion per providers order.  Vital signs within parameters for treatment.  Patient has no new complaints at this time.  Treatment given today per MD orders.  Stable during infusion without adverse affects.  Vital signs stable.  No complaints at this time.  Discharge from clinic ambulatory in stable condition.  Alert and oriented X 3.  Follow up with [facility] as scheduled.  

## 2022-06-14 ENCOUNTER — Inpatient Hospital Stay: Payer: Medicare HMO

## 2022-06-14 VITALS — BP 136/87 | HR 62 | Temp 97.0°F | Resp 18

## 2022-06-14 DIAGNOSIS — Z95828 Presence of other vascular implants and grafts: Secondary | ICD-10-CM

## 2022-06-14 DIAGNOSIS — D469 Myelodysplastic syndrome, unspecified: Secondary | ICD-10-CM

## 2022-06-14 DIAGNOSIS — Z5111 Encounter for antineoplastic chemotherapy: Secondary | ICD-10-CM | POA: Diagnosis not present

## 2022-06-14 MED ORDER — SODIUM CHLORIDE 0.9 % IV SOLN: Freq: Once | INTRAVENOUS | Status: AC

## 2022-06-14 MED ORDER — SODIUM CHLORIDE 0.9% FLUSH
10.0000 mL | INTRAVENOUS | Status: DC | PRN
  Administered 2022-06-14: 10 mL

## 2022-06-14 MED ORDER — SODIUM CHLORIDE 0.9 % IV SOLN
10.0000 mg | Freq: Once | INTRAVENOUS | Status: AC
  Administered 2022-06-14: 10 mg via INTRAVENOUS
  Filled 2022-06-14: qty 10

## 2022-06-14 MED ORDER — HEPARIN SOD (PORK) LOCK FLUSH 100 UNIT/ML IV SOLN
500.0000 [IU] | Freq: Once | INTRAVENOUS | Status: AC | PRN
  Administered 2022-06-14: 500 [IU]

## 2022-06-14 MED ORDER — PALONOSETRON HCL INJECTION 0.25 MG/5ML
0.2500 mg | Freq: Once | INTRAVENOUS | Status: AC
  Administered 2022-06-14: 0.25 mg via INTRAVENOUS
  Filled 2022-06-14: qty 5

## 2022-06-14 MED ORDER — SODIUM CHLORIDE 0.9 % IV SOLN
75.0000 mg/m2 | Freq: Once | INTRAVENOUS | Status: AC
  Administered 2022-06-14: 140 mg via INTRAVENOUS
  Filled 2022-06-14: qty 14

## 2022-06-14 NOTE — Patient Instructions (Signed)
[facility]  Discharge Instructions: Thank you for choosing [facility] to provide your oncology and hematology care.  If you have a lab appointment with the [facility], please come in thru the Main Entrance and check in at the main information desk.  Wear comfortable clothing and clothing appropriate for easy access to any Portacath or PICC line.   We strive to give you quality time with your provider. You may need to reschedule your appointment if you arrive late (15 or more minutes).  Arriving late affects you and other patients whose appointments are after yours.  Also, if you miss three or more appointments without notifying the office, you may be dismissed from the clinic at the provider's discretion.      For prescription refill requests, have your pharmacy contact our office and allow 72 hours for refills to be completed.    Today you received the following chemotherapy and/or immunotherapy agents Vidaza   To help prevent nausea and vomiting after your treatment, we encourage you to take your nausea medication as directed.   Azacitidine Injection What is this medication? AZACITIDINE (ay [place]) treats blood and bone marrow cancers. It works by slowing down the growth of cancer cells. This medicine may be used for other purposes; ask your health care provider or pharmacist if you have questions. COMMON BRAND NAME(S): Vidaza What should I tell my care team before I take this medication? They need to know if you have any of these conditions: Kidney disease Liver disease Low blood cell levels, such as low white cells, platelets, or red blood cells Low levels of albumin in the blood Low levels of bicarbonate in the blood An unusual or allergic reaction to azacitidine, mannitol, other medications, foods, dyes, or preservatives If you or your partner are pregnant or trying to get pregnant Breast-feeding How should I use this  medication? This medication is injected into a vein or under the skin. It is given by your care team in a hospital or clinic setting. Talk to your care team about the use of this medication in children. While it may be prescribed for children as young as 1 month for selected conditions, precautions do apply. Overdosage: If you think you have taken too much of this medicine contact a poison control center or emergency room at once. NOTE: This medicine is only for you. Do not share this medicine with others. What if I miss a dose? Keep appointments for follow-up doses. It is important not to miss your dose. Call your care team if you are unable to keep an appointment. What may interact with this medication? Interactions are not expected. This list may not describe all possible interactions. Give your health care provider a list of all the medicines, herbs, non-prescription drugs, or dietary supplements you use. Also tell them if you smoke, drink alcohol, or use illegal drugs. Some items may interact with your medicine. What should I watch for while using this medication? Your condition will be monitored carefully while you are receiving this medication. You may need blood work while taking this medication. This medication may make you feel generally unwell. This is not uncommon as chemotherapy can affect healthy cells as well as cancer cells. Report any side effects. Continue your course of treatment even though you feel ill unless your care team tells you to stop. Other product types may be available that contain the medication azacitidine. The injection and oral products should not  be used in place of one another. Talk to your care team if you have questions. This medication can cause serious side effects. To reduce the risk, your care team may give you other medications to take before receiving this one. Be sure to follow the directions from your care team. This medication may increase your risk of  getting an infection. Call your care team for advice if you get a fever, chills, sore throat, or other symptoms of a cold or flu. Do not treat yourself. Try to avoid being around people who are sick. Avoid taking medications that contain aspirin, acetaminophen, ibuprofen, naproxen, or ketoprofen unless instructed by your care team. These medications may hide a fever. This medication may increase your risk to bruise or bleed. Call your care team if you notice any unusual bleeding. Be careful brushing or flossing your teeth or using a toothpick because you may get an infection or bleed more easily. If you have any dental work done, tell your dentist you are receiving this medication. Talk to your care team if you or your partner may be pregnant. Serious birth defects can occur if you take this medication during pregnancy and for 6 months after the last dose. You will need a negative pregnancy test before starting this medication. Contraception is recommended while taking his medication and for 6 months after the last dose. Your care team can help you find the option that works for you. If your partner can get pregnant, use a condom during sex while taking this medication and for 3 months after the last dose. Do not breastfeed while taking this medication and for 1 week after the last dose. This medication may cause infertility. Talk to your care team if you are concerned about your fertility. What side effects may I notice from receiving this medication? Side effects that you should report to your care team as soon as possible: Allergic reactions--skin rash, itching, hives, swelling of the face, lips, tongue, or throat Infection--fever, chills, cough, sore throat, wounds that don't heal, pain or trouble when passing urine, general feeling of discomfort or being unwell Kidney injury--decrease in the amount of urine, swelling of the ankles, hands, or feet Liver injury--right upper belly pain, loss of  appetite, nausea, light-colored stool, dark yellow or brown urine, yellowing skin or eyes, unusual weakness or fatigue Low red blood cell level--unusual weakness or fatigue, dizziness, headache, trouble breathing Tumor lysis syndrome (TLS)--nausea, vomiting, diarrhea, decrease in the amount of urine, dark urine, unusual weakness or fatigue, confusion, muscle pain or cramps, fast or irregular heartbeat, joint pain Unusual bruising or bleeding Side effects that usually do not require medical attention (report to your care team if they continue or are bothersome): Constipation Diarrhea Nausea Pain, redness, or irritation at injection site Vomiting This list may not describe all possible side effects. Call your doctor for medical advice about side effects. You may report side effects to FDA at 1-800-FDA-1088. Where should I keep my medication? This medication is given in a hospital or clinic. It will not be stored at home. NOTE: This sheet is a summary. It may not cover all possible information. If you have questions about this medicine, talk to your doctor, pharmacist, or health care provider.  2023 [employer]vier/Gold Standard (2021-07-19 00:00:00)  BELOW ARE SYMPTOMS THAT SHOULD BE REPORTED IMMEDIATELY: *FEVER GREATER THAN 100.4 F (38 C) OR HIGHER *CHILLS OR SWEATING *NAUSEA AND VOMITING THAT IS NOT CONTROLLED WITH YOUR NAUSEA MEDICATION *UNUSUAL SHORTNESS OF BREATH *UNUSUAL BRUISING OR  BLEEDING *URINARY PROBLEMS (pain or burning when urinating, or frequent urination) *BOWEL PROBLEMS (unusual diarrhea, constipation, pain near the anus) TENDERNESS IN MOUTH AND THROAT WITH OR WITHOUT PRESENCE OF ULCERS (sore throat, sores in mouth, or a toothache) UNUSUAL RASH, SWELLING OR PAIN  UNUSUAL VAGINAL DISCHARGE OR ITCHING   Items with * indicate a potential emergency and should be followed up as soon as possible or go to the Emergency Department if any problems should occur.  Please show the  CHEMOTHERAPY ALERT CARD or IMMUNOTHERAPY ALERT CARD at check-in to the Emergency Department and triage nurse.  Should you have questions after your visit or need to cancel or reschedule your appointment, please contact [facility] [phone]  and follow the prompts.  Office hours are 8:00 a.m. to 4:30 p.m. Monday - Friday. Please note that voicemails left after 4:00 p.m. may not be returned until the following business day.  We are closed weekends and major holidays. You have access to a nurse at all times for urgent questions. Please call the main number to the clinic [phone] and follow the prompts.  For any non-urgent questions, you may also contact your provider using MyChart. We now offer e-Visits for anyone 53 and older to request care online for non-urgent symptoms. For details visit [email or web address].   Also download the MyChart app! Go to the app store, search "MyChart", open the app, select [facility], and log in with your MyChart username and password.

## 2022-06-14 NOTE — Progress Notes (Signed)
Patient presents today for D5 Vidaza infusion.  Patient is in satisfactory condition with no new complaints voiced.  Vital signs are stable. We will proceed with treatment per MD orders.    D5 Vidaza given today per MD orders. Tolerated infusion without adverse affects. Vital signs stable. No complaints at this time. Discharged from clinic ambulatory in stable condition. Alert and oriented x 3. F/U with [facility] as scheduled.

## 2022-06-17 ENCOUNTER — Other Ambulatory Visit (HOSPITAL_COMMUNITY): Payer: Self-pay

## 2022-06-17 ENCOUNTER — Inpatient Hospital Stay: Payer: Medicare HMO

## 2022-06-17 ENCOUNTER — Ambulatory Visit: Payer: Medicare HMO | Admitting: Hematology

## 2022-06-17 ENCOUNTER — Ambulatory Visit: Payer: Medicare HMO

## 2022-06-17 ENCOUNTER — Other Ambulatory Visit: Payer: Medicare HMO

## 2022-06-18 ENCOUNTER — Ambulatory Visit: Payer: Medicare HMO

## 2022-06-18 ENCOUNTER — Inpatient Hospital Stay: Payer: Medicare HMO

## 2022-06-18 ENCOUNTER — Other Ambulatory Visit: Payer: Self-pay

## 2022-06-19 ENCOUNTER — Ambulatory Visit: Payer: Medicare HMO

## 2022-06-20 ENCOUNTER — Ambulatory Visit: Payer: Medicare HMO

## 2022-06-21 ENCOUNTER — Ambulatory Visit: Payer: Medicare HMO

## 2022-06-24 ENCOUNTER — Other Ambulatory Visit: Payer: Medicare HMO

## 2022-06-24 ENCOUNTER — Ambulatory Visit: Payer: Medicare HMO

## 2022-06-25 ENCOUNTER — Ambulatory Visit: Payer: Medicare HMO

## 2022-07-11 ENCOUNTER — Other Ambulatory Visit (HOSPITAL_COMMUNITY): Payer: Self-pay

## 2022-07-15 ENCOUNTER — Other Ambulatory Visit: Payer: Self-pay | Admitting: Hematology

## 2022-07-15 ENCOUNTER — Other Ambulatory Visit (HOSPITAL_COMMUNITY): Payer: Self-pay

## 2022-07-15 DIAGNOSIS — D469 Myelodysplastic syndrome, unspecified: Secondary | ICD-10-CM

## 2022-07-15 MED ORDER — VENETOCLAX 100 MG PO TABS
200.0000 mg | ORAL_TABLET | Freq: Every day | ORAL | 0 refills | Status: DC
  Filled 2022-07-15: qty 28, 28d supply, fill #0

## 2022-07-16 ENCOUNTER — Other Ambulatory Visit: Payer: Self-pay

## 2022-07-17 ENCOUNTER — Other Ambulatory Visit (HOSPITAL_COMMUNITY): Payer: Self-pay

## 2022-07-21 NOTE — Progress Notes (Signed)
Chad Lamb [address], [place] [address]    Clinic Day:  07/22/2022  Referring physician: Aliene Beams, MD  Patient Care Team: Aliene Beams, MD as PCP - General (Family Medicine) Aliene Beams, MD (Family Medicine) [place] Bali, MD (Inactive) as Consulting Physician (Gastroenterology) Doreatha Massed, MD as Medical Oncologist (Hematology)   ASSESSMENT & PLAN:   Assessment: 1. MDS with EB 2: - Seen at the request of Dr. Tracie Harrier for cytopenias - BMBX on 06/04/2021: Hypercellular marrow with dyspoietic changes involving the granulocytic cell line and megakaryocytes.  Myeloblasts 8% on aspirate smears and 10% by flow.  Cytogenetics 53, XY (20).  MDS FISH panel normal. 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 - IPSS-M score of 0.12, moderate high risk.  Leukemia free survival is 2.3 years.  Overall survival 2.8 years. - Cycle 1 of azacitidine on 07/30/2021.  BMBX (01/04/2022): Overall improvement in blast count and cellularity.  Chromosome analysis and FISH panel normal.  Venetoclax 200 mg day 1 through 14 added with cycle 8 on 02/18/2022. - BMBX (06/05/2022): Slightly hypercellular for age with very mild dyspoietic changes at best associated with increased number of eosinophilic cells.  No increase in blast cells identified. - Azacitidine 5 days and venetoclax 200 mg days 1-14 every 6 weeks on 07/22/2022   2. Social/family history: - Chad Lamb lives at home with his wife.  Chad Lamb swims about 40 minutes daily. 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  Non-smoker. - Paternal aunt had breast cancer and mother had pancreatic cancer.  3.  Unprovoked left subclavian DVT: - Chad Lamb had unprovoked left subclavian DVT on 06/06/2015. - Chad Lamb underwent thrombolytic therapy at [facility]. - Chad Lamb will followed up with vascular at [facility] and has been on Xarelto since then.    Plan: 1. MDS with EB 2: - Chad Lamb has tolerated last  cycle of azacitidine x 5 days and venetoclax 200 mg days 1-14 reasonably well. - Chad Lamb had a dental infection for which she completed course of antibiotic. - Reviewed labs today: Normal LFTs except a bilirubin of 1.3.  CBC was grossly normal. - Recommend proceeding with treatment today with cycle 12 for 5 days and venetoclax for 14 days at 200 mg daily dose.  RTC 6 weeks for follow-up.  I will change his treatment plan to every 6 weeks.   2. Unprovoked left subclavian DVT: - Xarelto was discontinued due to hemorrhoidal bleeding.  No evidence of recurrence.   3. Constipation: - Continue Colace and MiraLAX.    Orders Placed This Encounter  Procedures   Magnesium    Standing Status:   Future    Standing Expiration Date:   09/02/2023   Comprehensive metabolic panel    Standing Status:   Future    Standing Expiration Date:   09/02/2023   CBC with Differential    Standing Status:   Future    Standing Expiration Date:   09/02/2023   Magnesium    Standing Status:   Future    Standing Expiration Date:   10/14/2023   Comprehensive metabolic panel    Standing Status:   Future    Standing Expiration Date:   10/14/2023   CBC with Differential    Standing Status:   Future    Standing Expiration Date:   10/14/2023   Magnesium    Standing Status:   Future    Standing Expiration  Date:   11/25/2023   Comprehensive metabolic panel    Standing Status:   Future    Standing Expiration Date:   11/25/2023   CBC with Differential    Standing Status:   Future    Standing Expiration Date:   11/25/2023   Magnesium    Standing Status:   Future    Standing Expiration Date:   01/06/2024   Comprehensive metabolic panel    Standing Status:   Future    Standing Expiration Date:   01/06/2024   CBC with Differential    Standing Status:   Future    Standing Expiration Date:   01/06/2024      I,Katie Daubenspeck,acting as a scribe for Doreatha Massed, MD.,have documented all relevant documentation on the behalf  of Doreatha Massed, MD,as directed by  Doreatha Massed, MD while in the presence of Doreatha Massed, MD.   I, Doreatha Massed MD, have reviewed the above documentation for accuracy and completeness, and I agree with the above.   Doreatha Massed, MD   5/6/20246:50 PM  CHIEF COMPLAINT:   Diagnosis: MDS with EB 2    Cancer Staging  No matching staging information was found for the patient.   Prior Therapy: none  Current Therapy:  Azacitidine IV D1-7 q28d    HISTORY OF PRESENT ILLNESS:   Oncology History  Myelodysplastic syndrome (HCC)  06/17/2021 Initial Diagnosis   Myelodysplastic syndrome (HCC)   07/30/2021 - 10/30/2021 Chemotherapy   Patient is on Treatment Plan : MYELODYSPLASIA  Azacitidine IV D1-7 q28d     07/30/2021 -  Chemotherapy   Patient is on Treatment Plan : MYELODYSPLASIA  Azacitidine IV D1-7 q28d        INTERVAL HISTORY:   Chad Lamb is a 77 y.o. male presenting to clinic today for follow up of MDS with EB 2. Chad Lamb was last seen by me on 06/10/22.  Today, Chad Lamb states that Chad Lamb is doing well overall. His appetite level is at 100%. His energy level is at 90%.  PAST MEDICAL HISTORY:   Past Medical History: Past Medical History:  Diagnosis Date   Allergy    Anxiety    Arthritis    Asthma    Cataract    Clotting disorder (HCC)    DVT (deep venous thrombosis) (HCC)    unknown etiology. [facility]   GERD (gastroesophageal reflux disease)    History of dental surgery    feb 2023   Hyperlipidemia    Hypertension    MDS (myelodysplastic syndrome), high grade (HCC) 06/17/2021   Pneumonia    Port-A-Cath in place 07/30/2021    Surgical History: Past Surgical History:  Procedure Laterality Date   CATARACT EXTRACTION W/PHACO Right 11/24/2017   Procedure: CATARACT EXTRACTION PHACO AND INTRAOCULAR LENS PLACEMENT RIGHT EYE;  Surgeon: Gemma Payor, MD;  Location: [facility];  Service: Ophthalmology;  Laterality: Right;  CDE: 8.61   CATARACT EXTRACTION W/PHACO  Left 12/29/2017   Procedure: CATARACT EXTRACTION PHACO AND INTRAOCULAR LENS PLACEMENT (IOC);  Surgeon: Gemma Payor, MD;  Location: [facility];  Service: Ophthalmology;  Laterality: Left;  CDE: 7.21   GANGLION CYST EXCISION     HERNIA REPAIR     bilateral   IR BONE MARROW BIOPSY & ASPIRATION  06/05/2022   PARS PLANA VITRECTOMY Left 05/17/2021   Procedure: PARS PLANA VITRECTOMY 25 GAUGE WITH ANTIBIOTIC INJECTION  AND ENDOLASER LEFT EYE;  Surgeon: Carmela Rima, MD;  Location: [facility];  Service: Ophthalmology;  Laterality: Left;   PORTACATH PLACEMENT Right  07/09/2021   Procedure: INSERTION PORT-A-CATH;  Surgeon: Franky Macho, MD;  Location: [facility];  Service: General;  Laterality: Right;   TONSILLECTOMY     VASCULAR SURGERY      Social History: Social History   Socioeconomic History   Marital status: Married    Spouse name: Harriett Sine   Number of children: 0   Years of education: 13   Highest education level: High school graduate  Occupational History   Not on file  Tobacco Use   Smoking status: Former    Types: Pipe    Quit date: 03/21/1966    Years since quitting: 56.3   Smokeless tobacco: Never  Vaping Use   Vaping Use: Never used  Substance and Sexual Activity   Alcohol use: Yes    Alcohol/week: 1.0 standard drink of alcohol    Types: 1 Cans of beer per week    Comment: 1 can of beer a week   Drug use: Never   Sexual activity: Not Currently  Other Topics Concern   Not on file  Social History Narrative   ** Merged History Encounter **       Grew up in [place], [place] and [place].  Retired.  Worked in [employer]. 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    Social Determinants of Health   Financial Resource Strain: Low Risk  (01/23/2017)   Overall Financial Resource Strain (CARDIA)    Difficulty of Paying Living Expenses: Not hard at all  Food Insecurity: No Food Insecurity (01/23/2017)   Hunger Vital Sign    Worried About Running Out of  Food in the Last Year: Never true    Ran Out of Food in the Last Year: Never true  Transportation Needs: No Transportation Needs (01/23/2017)   PRAPARE - Administrator, Civil Service (Medical): No    Lack of Transportation (Non-Medical): No  Physical Activity: Unknown (01/23/2017)   Exercise Vital Sign    Days of Exercise per Week: 3 days    Minutes of Exercise per Session: Not on file  Stress: Stress Concern Present (01/23/2017)   [employer] of Occupational Health - Occupational Stress Questionnaire    Feeling of Stress : To some extent  Social Connections: Somewhat Isolated (01/23/2017)   Social Connection and Isolation Panel [NHANES]    Frequency of Communication with Friends and Family: More than three times a week    Frequency of Social Gatherings with Friends and Family: More than three times a week    Attends Religious Services: Never    Database administrator or Organizations: No    Attends Banker Meetings: Never    Marital Status: Married  Catering manager Violence: Not At Risk (01/23/2017)   Humiliation, Afraid, Rape, and Kick questionnaire    Fear of Current or Ex-Partner: No    Emotionally Abused: No    Physically Abused: No    Sexually Abused: No    Family History: Family History  Problem Relation Age of Onset   Cancer Mother        pancreatic   Arthritis Father    Early death Father 77       Lung infiltrate   Colon cancer Neg Hx    Colon polyps Neg Hx     Current Medications:  Current Outpatient Medications:    albuterol (PROVENTIL HFA;VENTOLIN HFA) 108 (90 Base) MCG/ACT inhaler, Inhale 1-2 puffs into the lungs every 6 (six) hours as needed  for wheezing or shortness of breath., Disp: , Rfl:    ALPRAZolam (XANAX) 0.5 MG tablet, Take 0.5 mg by mouth 2 (two) times daily as needed for anxiety., Disp: , Rfl:    amLODipine (NORVASC) 2.5 MG tablet, Take 2.5 mg by mouth daily., Disp: , Rfl:    azaCITIDine 5 mg/2 mLs in lactated ringers  infusion, Inject into the vein daily. Days 1-7 every 28 days, Disp: , Rfl:    DENTA 5000 PLUS 1.1 % CREA dental cream, Take by mouth., Disp: , Rfl:    fluticasone furoate-vilanterol (BREO ELLIPTA) 200-25 MCG/ACT AEPB, Inhale 1 puff into the lungs daily., Disp: , Rfl:    Lactulose 20 GM/30ML SOLN, Take 30 mLs (20 g total) by mouth at bedtime., Disp: 473 mL, Rfl: 3   Lidocaine-Hydrocort, Perianal, 3-0.5 % CREA, Apply topically 2 (two) times daily as needed., Disp: , Rfl:    Lidocaine-Hydrocortisone Ace 3-0.5 % CREA, Apply 1 application topically as directed. (Patient taking differently: Apply 1 application  topically 2 (two) times daily as needed (hemorrhoids).), Disp: 30 Tube, Rfl: 11   lidocaine-prilocaine (EMLA) cream, Apply a SMALL AMOUNT TO port a cath site (DO not RUB in) AND cover with PLASTIC WRAP ONE hour prior TO infusion appointment, Disp: 30 g, Rfl: 3   loratadine (CLARITIN) 10 MG tablet, Take 10 mg by mouth daily., Disp: , Rfl:    Lutein 40 MG CAPS, Take 40 mg by mouth daily., Disp: , Rfl:    Multiple Vitamins-Minerals (CENTRUM ADULTS PO), Take 1 tablet by mouth daily., Disp: , Rfl:    nystatin cream (MYCOSTATIN), Apply 1 application. topically 2 (two) times daily as needed for dry skin., Disp: , Rfl:    ofloxacin (OCUFLOX) 0.3 % ophthalmic solution, Place 1 drop into the left eye See admin instructions. 1 drop to left eye 4 x daily for 7 days after monthly procedure., Disp: , Rfl:    omeprazole (PRILOSEC OTC) 20 MG tablet, Take 5-10 mg by mouth daily., Disp: , Rfl:    sildenafil (VIAGRA) 50 MG tablet, Take 50 mg by mouth daily as needed for erectile dysfunction., Disp: , Rfl:    traMADol (ULTRAM) 50 MG tablet, Take 1 tablet (50 mg total) by mouth every 6 (six) hours as needed., Disp: 15 tablet, Rfl: 0   venetoclax (VENCLEXTA) 100 MG tablet, Take 2 tablets (200 mg total) by mouth daily. Take for 14 days, then hold for 14 days. Repeat every 28 days. Take with a meal and a full glass of  water., Disp: 28 tablet, Rfl: 0 No current facility-administered medications for this visit.  Facility-Administered Medications Ordered in Other Visits:    sodium chloride flush (NS) 0.9 % injection 10 mL, 10 mL, Intracatheter, PRN, Doreatha Massed, MD, 10 mL at 06/12/22 1606   Allergies: Allergies  Allergen Reactions   Cephalosporins Itching   Amoxapine And Related Itching and Rash    Has patient had a PCN reaction causing immediate rash, facial/tongue/throat swelling, SOB or lightheadedness with hypotension: No Has patient had a PCN reaction causing severe rash involving mucus membranes or skin necrosis: No Has patient had a PCN reaction that required hospitalization: No Has patient had a PCN reaction occurring within the last 10 years: No If all of the above answers are "NO", then may proceed with Cephalosporin use.     Amoxicillin Rash   Cephalexin Rash   Clindamycin/Lincomycin Itching and Rash   Sulfamethoxazole Itching and Rash   Trimethoprim Rash    REVIEW OF  SYSTEMS:   Review of Systems  Constitutional:  Negative for chills, fatigue and fever.  HENT:   Negative for lump/mass, mouth sores, nosebleeds, sore throat and trouble swallowing.   Eyes:  Negative for eye problems.  Respiratory:  Positive for shortness of breath. Negative for cough.   Cardiovascular:  Negative for chest pain, leg swelling and palpitations.  Gastrointestinal:  Negative for abdominal pain, constipation, diarrhea, nausea and vomiting.  Genitourinary:  Negative for bladder incontinence, difficulty urinating, dysuria, frequency, hematuria and nocturia.   Musculoskeletal:  Negative for arthralgias, back pain, flank pain, myalgias and neck pain.  Skin:  Negative for itching and rash.  Neurological:  Negative for dizziness, headaches and numbness.  Hematological:  Does not bruise/bleed easily.  Psychiatric/Behavioral:  Positive for depression. Negative for sleep disturbance and suicidal ideas. The  patient is nervous/anxious.   All other systems reviewed and are negative.    VITALS:   Blood pressure 129/74, pulse 66, temperature 98.4 F (36.9 C), resp. rate 18, weight 157 lb 9.6 oz (71.5 kg), SpO2 97 %.  Wt Readings from Last 3 Encounters:  07/22/22 157 lb 9.6 oz (71.5 kg)  06/10/22 156 lb 4.8 oz (70.9 kg)  06/05/22 153 lb (69.4 kg)    Body mass index is 22.61 kg/m.  Performance status (ECOG): 1 - Symptomatic but completely ambulatory  PHYSICAL EXAM:   Physical Exam Vitals and nursing note reviewed. Exam conducted with a chaperone present.  Constitutional:      Appearance: Normal appearance.  Cardiovascular:     Rate and Rhythm: Normal rate and regular rhythm.     Pulses: Normal pulses.     Heart sounds: Normal heart sounds.  Pulmonary:     Effort: Pulmonary effort is normal.     Breath sounds: Normal breath sounds.  Abdominal:     Palpations: Abdomen is soft. There is no hepatomegaly, splenomegaly or mass.     Tenderness: There is no abdominal tenderness.  Musculoskeletal:     Right lower leg: No edema.     Left lower leg: No edema.  Lymphadenopathy:     Cervical: No cervical adenopathy.     Right cervical: No superficial, deep or posterior cervical adenopathy.    Left cervical: No superficial, deep or posterior cervical adenopathy.     Upper Body:     Right upper body: No supraclavicular or axillary adenopathy.     Left upper body: No supraclavicular or axillary adenopathy.  Neurological:     General: No focal deficit present.     Mental Status: Chad Lamb is alert and oriented to person, place, and time.  Psychiatric:        Mood and Affect: Mood normal.        Behavior: Behavior normal.     LABS:      Latest Ref Rng & Units 07/22/2022    8:27 AM 06/10/2022   12:14 PM 06/05/2022    7:15 AM  CBC  WBC 4.0 - 10.5 K/uL 5.5  4.5  4.6   Hemoglobin 13.0 - 17.0 g/dL 60.4  54.0  98.1   Hematocrit 39.0 - 52.0 % 34.4  35.0  35.4   Platelets 150 - 400 K/uL 211  228   228       Latest Ref Rng & Units 07/22/2022    8:27 AM 06/10/2022   12:14 PM 05/27/2022   11:01 AM  CMP  Glucose 70 - 99 mg/dL 191  478  295   BUN 8 - 23 mg/dL  15  15  14    Creatinine 0.61 - 1.24 mg/dL 0.98  1.19  1.47   Sodium 135 - 145 mmol/L 138  138  136   Potassium 3.5 - 5.1 mmol/L 3.6  3.6  3.7   Chloride 98 - 111 mmol/L 106  105  104   CO2 22 - 32 mmol/L 25  25  26    Calcium 8.9 - 10.3 mg/dL 9.0  9.3  8.9   Total Protein 6.5 - 8.1 g/dL 6.0  5.9  5.9   Total Bilirubin 0.3 - 1.2 mg/dL 1.3  0.9  1.5   Alkaline Phos 38 - 126 U/L 61  66  65   AST 15 - 41 U/L 22  21  20    ALT 0 - 44 U/L 16  15  14       No results found for: "CEA1", "CEA" / No results found for: "CEA1", "CEA" No results found for: "PSA1" No results found for: "WGN562" No results found for: "CAN125"  Lab Results  Component Value Date   TOTALPROTELP 6.3 01/05/2021   ALBUMINELP 3.3 01/05/2021   A1GS 0.3 01/05/2021   A2GS 0.8 01/05/2021   BETS 1.0 01/05/2021   GAMS 1.0 01/05/2021   MSPIKE Not Observed 01/05/2021   SPEI Comment 01/05/2021   No results found for: "TIBC", "FERRITIN", "IRONPCTSAT" Lab Results  Component Value Date   LDH 147 11/20/2021   LDH 143 10/22/2021   LDH 144 10/16/2021     STUDIES:   No results found.

## 2022-07-22 ENCOUNTER — Inpatient Hospital Stay: Payer: Medicare HMO | Attending: Hematology

## 2022-07-22 ENCOUNTER — Inpatient Hospital Stay (HOSPITAL_BASED_OUTPATIENT_CLINIC_OR_DEPARTMENT_OTHER): Payer: Medicare HMO | Admitting: Hematology

## 2022-07-22 ENCOUNTER — Inpatient Hospital Stay: Payer: Medicare HMO

## 2022-07-22 VITALS — BP 125/81 | HR 56 | Temp 97.5°F | Resp 16

## 2022-07-22 VITALS — BP 129/74 | HR 66 | Temp 98.4°F | Resp 18 | Wt 157.6 lb

## 2022-07-22 DIAGNOSIS — D469 Myelodysplastic syndrome, unspecified: Secondary | ICD-10-CM

## 2022-07-22 DIAGNOSIS — Z95828 Presence of other vascular implants and grafts: Secondary | ICD-10-CM

## 2022-07-22 DIAGNOSIS — Z5111 Encounter for antineoplastic chemotherapy: Secondary | ICD-10-CM | POA: Insufficient documentation

## 2022-07-22 DIAGNOSIS — D4622 Refractory anemia with excess of blasts 2: Secondary | ICD-10-CM | POA: Insufficient documentation

## 2022-07-22 LAB — MAGNESIUM: Magnesium: 2 mg/dL (ref 1.7–2.4)

## 2022-07-22 LAB — CBC WITH DIFFERENTIAL/PLATELET
Abs Immature Granulocytes: 0.01 10*3/uL (ref 0.00–0.07)
Basophils Absolute: 0 10*3/uL (ref 0.0–0.1)
Basophils Relative: 1 %
Eosinophils Absolute: 0.8 10*3/uL — ABNORMAL HIGH (ref 0.0–0.5)
Eosinophils Relative: 14 %
HCT: 34.4 % — ABNORMAL LOW (ref 39.0–52.0)
Hemoglobin: 11 g/dL — ABNORMAL LOW (ref 13.0–17.0)
Immature Granulocytes: 0 %
Lymphocytes Relative: 22 %
Lymphs Abs: 1.2 10*3/uL (ref 0.7–4.0)
MCH: 26.1 pg (ref 26.0–34.0)
MCHC: 32 g/dL (ref 30.0–36.0)
MCV: 81.7 fL (ref 80.0–100.0)
Monocytes Absolute: 0.7 10*3/uL (ref 0.1–1.0)
Monocytes Relative: 12 %
Neutro Abs: 2.8 10*3/uL (ref 1.7–7.7)
Neutrophils Relative %: 51 %
Platelets: 211 10*3/uL (ref 150–400)
RBC: 4.21 MIL/uL — ABNORMAL LOW (ref 4.22–5.81)
RDW: 17.1 % — ABNORMAL HIGH (ref 11.5–15.5)
WBC: 5.5 10*3/uL (ref 4.0–10.5)
nRBC: 0 % (ref 0.0–0.2)

## 2022-07-22 LAB — COMPREHENSIVE METABOLIC PANEL
ALT: 16 U/L (ref 0–44)
AST: 22 U/L (ref 15–41)
Albumin: 3.7 g/dL (ref 3.5–5.0)
Alkaline Phosphatase: 61 U/L (ref 38–126)
Anion gap: 7 (ref 5–15)
BUN: 15 mg/dL (ref 8–23)
CO2: 25 mmol/L (ref 22–32)
Calcium: 9 mg/dL (ref 8.9–10.3)
Chloride: 106 mmol/L (ref 98–111)
Creatinine, Ser: 0.96 mg/dL (ref 0.61–1.24)
GFR, Estimated: >60 mL/min (ref >60)
Glucose, Bld: 128 mg/dL — ABNORMAL HIGH (ref 70–99)
Potassium: 3.6 mmol/L (ref 3.5–5.1)
Sodium: 138 mmol/L (ref 135–145)
Total Bilirubin: 1.3 mg/dL — ABNORMAL HIGH (ref 0.3–1.2)
Total Protein: 6 g/dL — ABNORMAL LOW (ref 6.5–8.1)

## 2022-07-22 MED ORDER — SODIUM CHLORIDE 0.9 % IV SOLN
75.0000 mg/m2 | Freq: Once | INTRAVENOUS | Status: AC
  Administered 2022-07-22: 140 mg via INTRAVENOUS
  Filled 2022-07-22: qty 14

## 2022-07-22 MED ORDER — SODIUM CHLORIDE 0.9 % IV SOLN
10.0000 mg | Freq: Once | INTRAVENOUS | Status: AC
  Administered 2022-07-22: 10 mg via INTRAVENOUS
  Filled 2022-07-22: qty 10

## 2022-07-22 MED ORDER — HEPARIN SOD (PORK) LOCK FLUSH 100 UNIT/ML IV SOLN
500.0000 [IU] | Freq: Once | INTRAVENOUS | Status: AC | PRN
  Administered 2022-07-22: 500 [IU]

## 2022-07-22 MED ORDER — SODIUM CHLORIDE 0.9% FLUSH
10.0000 mL | INTRAVENOUS | Status: DC | PRN
  Administered 2022-07-22: 10 mL

## 2022-07-22 MED ORDER — PALONOSETRON HCL INJECTION 0.25 MG/5ML
0.2500 mg | Freq: Once | INTRAVENOUS | Status: AC
  Administered 2022-07-22: 0.25 mg via INTRAVENOUS
  Filled 2022-07-22: qty 5

## 2022-07-22 MED ORDER — SODIUM CHLORIDE 0.9% FLUSH
10.0000 mL | Freq: Once | INTRAVENOUS | Status: AC
  Administered 2022-07-22: 10 mL via INTRAVENOUS

## 2022-07-22 MED ORDER — SODIUM CHLORIDE 0.9 % IV SOLN: Freq: Once | INTRAVENOUS | Status: AC

## 2022-07-22 NOTE — Progress Notes (Signed)
Patient presents today for chemotherapy infusion. Patient is in satisfactory condition with no new complaints voiced.  Vital signs are stable.  Labs reviewed by Dr. Katragadda during the office visit and all labs are within treatment parameters.  We will proceed with treatment per MD orders.   Patient tolerated treatment well with no complaints voiced.  Patient left ambulatory in stable condition.  Vital signs stable at discharge.  Follow up as scheduled.       

## 2022-07-22 NOTE — Patient Instructions (Signed)
[facility] at [facility] Discharge Instructions   You were seen and examined today by Dr. Katragadda.  He reviewed the results of your lab work which are normal/stable.   We will proceed with your treatment today.  Return as scheduled.    Thank you for choosing [facility] at [facility] to provide your oncology and hematology care.  To afford each patient quality time with our provider, please arrive at least 15 minutes before your scheduled appointment time.   If you have a lab appointment with the Cancer Center please come in thru the Main Entrance and check in at the main information desk.  You need to re-schedule your appointment should you arrive 10 or more minutes late.  We strive to give you quality time with our providers, and arriving late affects you and other patients whose appointments are after yours.  Also, if you no show three or more times for appointments you may be dismissed from the clinic at the providers discretion.     Again, thank you for choosing [facility].  Our hope is that these requests will decrease the amount of time that you wait before being seen by our physicians.       _____________________________________________________________  Should you have questions after your visit to [facility], please contact our office at [phone] and follow the prompts.  Our office hours are 8:00 a.m. and 4:30 p.m. Monday - Friday.  Please note that voicemails left after 4:00 p.m. may not be returned until the following business day.  We are closed weekends and major holidays.  You do have access to a nurse 24-7, just call the main number to the clinic [phone] and do not press any options, hold on the line and a nurse will answer the phone.    For prescription refill requests, have your pharmacy contact our office and allow 72 hours.    Due to Covid, you will need to wear a mask upon entering  the hospital. If you do not have a mask, a mask will be given to you at the Main Entrance upon arrival. For doctor visits, patients may have 1 support person age 18 or older with them. For treatment visits, patients can not have anyone with them due to social distancing guidelines and our immunocompromised population.      

## 2022-07-22 NOTE — Progress Notes (Signed)
Patient has been examined by Dr. Katragadda. Vital signs and labs have been reviewed by MD - ANC, Creatinine, LFTs, hemoglobin, and platelets are within treatment parameters per M.D. - pt may proceed with treatment.  Primary RN and pharmacy notified.  

## 2022-07-22 NOTE — Patient Instructions (Signed)
[facility]  Discharge Instructions: Thank you for choosing [facility] to provide your oncology and hematology care.  If you have a lab appointment with the Cancer Center - please note that after April 8th, 2024, all labs will be drawn in the cancer center.  You do not have to check in or register with the main entrance as you have in the past but will complete your check-in in the cancer center.  Wear comfortable clothing and clothing appropriate for easy access to any Portacath or PICC line.   We strive to give you quality time with your provider. You may need to reschedule your appointment if you arrive late (15 or more minutes).  Arriving late affects you and other patients whose appointments are after yours.  Also, if you miss three or more appointments without notifying the office, you may be dismissed from the clinic at the provider's discretion.      For prescription refill requests, have your pharmacy contact our office and allow 72 hours for refills to be completed.    Today you received the following chemotherapy and/or immunotherapy agents Vidaza.  Azacitidine Injection What is this medication? AZACITIDINE (ay za SITE i deen) treats blood and bone marrow cancers. It works by slowing down the growth of cancer cells. This medicine may be used for other purposes; ask your health care provider or pharmacist if you have questions. COMMON BRAND NAME(S): Vidaza What should I tell my care team before I take this medication? They need to know if you have any of these conditions: Kidney disease Liver disease Low blood cell levels, such as low white cells, platelets, or red blood cells Low levels of albumin in the blood Low levels of bicarbonate in the blood An unusual or allergic reaction to azacitidine, mannitol, other medications, foods, dyes, or preservatives If you or your partner are pregnant or trying to get pregnant Breast-feeding How should I  use this medication? This medication is injected into a vein or under the skin. It is given by your care team in a hospital or clinic setting. Talk to your care team about the use of this medication in children. While it may be prescribed for children as young as 1 month for selected conditions, precautions do apply. Overdosage: If you think you have taken too much of this medicine contact a poison control center or emergency room at once. NOTE: This medicine is only for you. Do not share this medicine with others. What if I miss a dose? Keep appointments for follow-up doses. It is important not to miss your dose. Call your care team if you are unable to keep an appointment. What may interact with this medication? Interactions are not expected. This list may not describe all possible interactions. Give your health care provider a list of all the medicines, herbs, non-prescription drugs, or dietary supplements you use. Also tell them if you smoke, drink alcohol, or use illegal drugs. Some items may interact with your medicine. What should I watch for while using this medication? Your condition will be monitored carefully while you are receiving this medication. You may need blood work while taking this medication. This medication may make you feel generally unwell. This is not uncommon as chemotherapy can affect healthy cells as well as cancer cells. Report any side effects. Continue your course of treatment even though you feel ill unless your care team tells you to stop. Other product types may be available that contain the   medication azacitidine. The injection and oral products should not be used in place of one another. Talk to your care team if you have questions. This medication can cause serious side effects. To reduce the risk, your care team may give you other medications to take before receiving this one. Be sure to follow the directions from your care team. This medication may increase your  risk of getting an infection. Call your care team for advice if you get a fever, chills, sore throat, or other symptoms of a cold or flu. Do not treat yourself. Try to avoid being around people who are sick. Avoid taking medications that contain aspirin, acetaminophen, ibuprofen, naproxen, or ketoprofen unless instructed by your care team. These medications may hide a fever. This medication may increase your risk to bruise or bleed. Call your care team if you notice any unusual bleeding. Be careful brushing or flossing your teeth or using a toothpick because you may get an infection or bleed more easily. If you have any dental work done, tell your dentist you are receiving this medication. Talk to your care team if you or your partner may be pregnant. Serious birth defects can occur if you take this medication during pregnancy and for 6 months after the last dose. You will need a negative pregnancy test before starting this medication. Contraception is recommended while taking his medication and for 6 months after the last dose. Your care team can help you find the option that works for you. If your partner can get pregnant, use a condom during sex while taking this medication and for 3 months after the last dose. Do not breastfeed while taking this medication and for 1 week after the last dose. This medication may cause infertility. Talk to your care team if you are concerned about your fertility. What side effects may I notice from receiving this medication? Side effects that you should report to your care team as soon as possible: Allergic reactions--skin rash, itching, hives, swelling of the face, lips, tongue, or throat Infection--fever, chills, cough, sore throat, wounds that don't heal, pain or trouble when passing urine, general feeling of discomfort or being unwell Kidney injury--decrease in the amount of urine, swelling of the ankles, hands, or feet Liver injury--right upper belly pain, loss  of appetite, nausea, light-colored stool, dark yellow or Chad [name], yellowing skin or eyes, unusual weakness or fatigue Low red blood cell level--unusual weakness or fatigue, dizziness, headache, trouble breathing Tumor lysis syndrome (TLS)--nausea, vomiting, diarrhea, decrease in the amount of urine, dark urine, unusual weakness or fatigue, confusion, muscle pain or cramps, fast or irregular heartbeat, joint pain Unusual bruising or bleeding Side effects that usually do not require medical attention (report to your care team if they continue or are bothersome): Constipation Diarrhea Nausea Pain, redness, or irritation at injection site Vomiting This list may not describe all possible side effects. Call your doctor for medical advice about side effects. You may report side effects to FDA at 1-800-FDA-1088. Where should I keep my medication? This medication is given in a hospital or clinic. It will not be stored at home. NOTE: This sheet is a summary. It may not cover all possible information. If you have questions about this medicine, talk to your doctor, pharmacist, or health care provider.  2023 Elsevier/Gold Standard (2021-07-19 00:00:00)        To help prevent nausea and vomiting after your treatment, we encourage you to take your nausea medication as directed.  BELOW ARE SYMPTOMS   THAT SHOULD BE REPORTED IMMEDIATELY: *FEVER GREATER THAN 100.4 F (38 C) OR HIGHER *CHILLS OR SWEATING *NAUSEA AND VOMITING THAT IS NOT CONTROLLED WITH YOUR NAUSEA MEDICATION *UNUSUAL SHORTNESS OF BREATH *UNUSUAL BRUISING OR BLEEDING *URINARY PROBLEMS (pain or burning when urinating, or frequent urination) *BOWEL PROBLEMS (unusual diarrhea, constipation, pain near the anus) TENDERNESS IN MOUTH AND THROAT WITH OR WITHOUT PRESENCE OF ULCERS (sore throat, sores in mouth, or a toothache) UNUSUAL RASH, SWELLING OR PAIN  UNUSUAL VAGINAL DISCHARGE OR ITCHING   Items with * indicate a potential emergency and  should be followed up as soon as possible or go to the Emergency Department if any problems should occur.  Please show the CHEMOTHERAPY ALERT CARD or IMMUNOTHERAPY ALERT CARD at check-in to the Emergency Department and triage nurse.  Should you have questions after your visit or need to cancel or reschedule your appointment, please contact [facility] [phone]  and follow the prompts.  Office hours are 8:00 a.m. to 4:30 p.m. Monday - Friday. Please note that voicemails left after 4:00 p.m. may not be returned until the following business day.  We are closed weekends and major holidays. You have access to a nurse at all times for urgent questions. Please call the main number to the clinic [phone] and follow the prompts.  For any non-urgent questions, you may also contact your provider using MyChart. We now offer e-Visits for anyone 18 and older to request care online for non-urgent symptoms. For details visit [email or web address].   Also download the MyChart app! Go to the app store, search "MyChart", open the app, select [facility], and log in with your MyChart username and password.   

## 2022-07-23 ENCOUNTER — Inpatient Hospital Stay: Payer: Medicare HMO

## 2022-07-23 ENCOUNTER — Other Ambulatory Visit: Payer: Self-pay

## 2022-07-23 VITALS — BP 128/81 | HR 59 | Temp 97.8°F | Resp 16

## 2022-07-23 DIAGNOSIS — D469 Myelodysplastic syndrome, unspecified: Secondary | ICD-10-CM

## 2022-07-23 DIAGNOSIS — Z95828 Presence of other vascular implants and grafts: Secondary | ICD-10-CM

## 2022-07-23 DIAGNOSIS — Z5111 Encounter for antineoplastic chemotherapy: Secondary | ICD-10-CM | POA: Diagnosis not present

## 2022-07-23 MED ORDER — SODIUM CHLORIDE 0.9% FLUSH
10.0000 mL | INTRAVENOUS | Status: DC | PRN
  Administered 2022-07-23: 10 mL

## 2022-07-23 MED ORDER — SODIUM CHLORIDE 0.9 % IV SOLN
75.0000 mg/m2 | Freq: Once | INTRAVENOUS | Status: AC
  Administered 2022-07-23: 140 mg via INTRAVENOUS
  Filled 2022-07-23: qty 14

## 2022-07-23 MED ORDER — HEPARIN SOD (PORK) LOCK FLUSH 100 UNIT/ML IV SOLN
500.0000 [IU] | Freq: Once | INTRAVENOUS | Status: AC | PRN
  Administered 2022-07-23: 500 [IU]

## 2022-07-23 MED ORDER — SODIUM CHLORIDE 0.9 % IV SOLN
10.0000 mg | Freq: Once | INTRAVENOUS | Status: AC
  Administered 2022-07-23: 10 mg via INTRAVENOUS
  Filled 2022-07-23: qty 1

## 2022-07-23 MED ORDER — SODIUM CHLORIDE 0.9 % IV SOLN: Freq: Once | INTRAVENOUS | Status: AC

## 2022-07-23 NOTE — Patient Instructions (Signed)
[facility]  Discharge Instructions: Thank you for choosing [facility] to provide your oncology and hematology care.  If you have a lab appointment with the Cancer Center - please note that after April 8th, 2024, all labs will be drawn in the cancer center.  You do not have to check in or register with the main entrance as you have in the past but will complete your check-in in the cancer center.  Wear comfortable clothing and clothing appropriate for easy access to any Portacath or PICC line.   We strive to give you quality time with your provider. You may need to reschedule your appointment if you arrive late (15 or more minutes).  Arriving late affects you and other patients whose appointments are after yours.  Also, if you miss three or more appointments without notifying the office, you may be dismissed from the clinic at the provider's discretion.      For prescription refill requests, have your pharmacy contact our office and allow 72 hours for refills to be completed.    Today you received D2 Vidaza   To help prevent nausea and vomiting after your treatment, we encourage you to take your nausea medication as directed.  Azacitidine Injection What is this medication? AZACITIDINE (ay za SITE i deen) treats blood and bone marrow cancers. It works by slowing down the growth of cancer cells. This medicine may be used for other purposes; ask your health care provider or pharmacist if you have questions. COMMON BRAND NAME(S): Vidaza What should I tell my care team before I take this medication? They need to know if you have any of these conditions: Kidney disease Liver disease Low blood cell levels, such as low white cells, platelets, or red blood cells Low levels of albumin in the blood Low levels of bicarbonate in the blood An unusual or allergic reaction to azacitidine, mannitol, other medications, foods, dyes, or preservatives If you or your partner  are pregnant or trying to get pregnant Breast-feeding How should I use this medication? This medication is injected into a vein or under the skin. It is given by your care team in a hospital or clinic setting. Talk to your care team about the use of this medication in children. While it may be prescribed for children as young as 1 month for selected conditions, precautions do apply. Overdosage: If you think you have taken too much of this medicine contact a poison control center or emergency room at once. NOTE: This medicine is only for you. Do not share this medicine with others. What if I miss a dose? Keep appointments for follow-up doses. It is important not to miss your dose. Call your care team if you are unable to keep an appointment. What may interact with this medication? Interactions are not expected. This list may not describe all possible interactions. Give your health care provider a list of all the medicines, herbs, non-prescription drugs, or dietary supplements you use. Also tell them if you smoke, drink alcohol, or use illegal drugs. Some items may interact with your medicine. What should I watch for while using this medication? Your condition will be monitored carefully while you are receiving this medication. You may need blood work while taking this medication. This medication may make you feel generally unwell. This is not uncommon as chemotherapy can affect healthy cells as well as cancer cells. Report any side effects. Continue your course of treatment even though you feel ill unless  your care team tells you to stop. Other product types may be available that contain the medication azacitidine. The injection and oral products should not be used in place of one another. Talk to your care team if you have questions. This medication can cause serious side effects. To reduce the risk, your care team may give you other medications to take before receiving this one. Be sure to follow  the directions from your care team. This medication may increase your risk of getting an infection. Call your care team for advice if you get a fever, chills, sore throat, or other symptoms of a cold or flu. Do not treat yourself. Try to avoid being around people who are sick. Avoid taking medications that contain aspirin, acetaminophen, ibuprofen, naproxen, or ketoprofen unless instructed by your care team. These medications may hide a fever. This medication may increase your risk to bruise or bleed. Call your care team if you notice any unusual bleeding. Be careful brushing or flossing your teeth or using a toothpick because you may get an infection or bleed more easily. If you have any dental work done, tell your dentist you are receiving this medication. Talk to your care team if you or your partner may be pregnant. Serious birth defects can occur if you take this medication during pregnancy and for 6 months after the last dose. You will need a negative pregnancy test before starting this medication. Contraception is recommended while taking his medication and for 6 months after the last dose. Your care team can help you find the option that works for you. If your partner can get pregnant, use a condom during sex while taking this medication and for 3 months after the last dose. Do not breastfeed while taking this medication and for 1 week after the last dose. This medication may cause infertility. Talk to your care team if you are concerned about your fertility. What side effects may I notice from receiving this medication? Side effects that you should report to your care team as soon as possible: Allergic reactions--skin rash, itching, hives, swelling of the face, lips, tongue, or throat Infection--fever, chills, cough, sore throat, wounds that don't heal, pain or trouble when passing urine, general feeling of discomfort or being unwell Kidney injury--decrease in the amount of urine, swelling of  the ankles, hands, or feet Liver injury--right upper belly pain, loss of appetite, nausea, light-colored stool, dark yellow or brown urine, yellowing skin or eyes, unusual weakness or fatigue Low red blood cell level--unusual weakness or fatigue, dizziness, headache, trouble breathing Tumor lysis syndrome (TLS)--nausea, vomiting, diarrhea, decrease in the amount of urine, dark urine, unusual weakness or fatigue, confusion, muscle pain or cramps, fast or irregular heartbeat, joint pain Unusual bruising or bleeding Side effects that usually do not require medical attention (report to your care team if they continue or are bothersome): Constipation Diarrhea Nausea Pain, redness, or irritation at injection site Vomiting This list may not describe all possible side effects. Call your doctor for medical advice about side effects. You may report side effects to FDA at 1-800-FDA-1088. Where should I keep my medication? This medication is given in a hospital or clinic. It will not be stored at home. NOTE: This sheet is a summary. It may not cover all possible information. If you have questions about this medicine, talk to your doctor, pharmacist, or health care provider.  2023 Elsevier/Gold Standard (2021-07-19 00:00:00)   BELOW ARE SYMPTOMS THAT SHOULD BE REPORTED IMMEDIATELY: *FEVER GREATER THAN 100.4  F (38 C) OR HIGHER *CHILLS OR SWEATING *NAUSEA AND VOMITING THAT IS NOT CONTROLLED WITH YOUR NAUSEA MEDICATION *UNUSUAL SHORTNESS OF BREATH *UNUSUAL BRUISING OR BLEEDING *URINARY PROBLEMS (pain or burning when urinating, or frequent urination) *BOWEL PROBLEMS (unusual diarrhea, constipation, pain near the anus) TENDERNESS IN MOUTH AND THROAT WITH OR WITHOUT PRESENCE OF ULCERS (sore throat, sores in mouth, or a toothache) UNUSUAL RASH, SWELLING OR PAIN  UNUSUAL VAGINAL DISCHARGE OR ITCHING   Items with * indicate a potential emergency and should be followed up as soon as possible or go to the  Emergency Department if any problems should occur.  Please show the CHEMOTHERAPY ALERT CARD or IMMUNOTHERAPY ALERT CARD at check-in to the Emergency Department and triage nurse.  Should you have questions after your visit or need to cancel or reschedule your appointment, please contact [facility] [phone]  and follow the prompts.  Office hours are 8:00 a.m. to 4:30 p.m. Monday - Friday. Please note that voicemails left after 4:00 p.m. may not be returned until the following business day.  We are closed weekends and major holidays. You have access to a nurse at all times for urgent questions. Please call the main number to the clinic [phone] and follow the prompts.  For any non-urgent questions, you may also contact your provider using MyChart. We now offer e-Visits for anyone 45 and older to request care online for non-urgent symptoms. For details visit [email or web address].   Also download the MyChart app! Go to the app store, search "MyChart", open the app, select [facility], and log in with your MyChart username and password.

## 2022-07-23 NOTE — Progress Notes (Signed)
Pt presents today for D2 Vidaza per provider's order. Vital signs stable and pt voiced no new complaints at this time.  D2 Vidaza given today per MD orders. Tolerated infusion without adverse affects. Vital signs stable. No complaints at this time. Discharged from clinic ambulatory in stable condition. Alert and oriented x 3. F/U with [facility] as scheduled.   

## 2022-07-24 ENCOUNTER — Inpatient Hospital Stay: Payer: Medicare HMO

## 2022-07-24 VITALS — BP 135/82 | HR 72 | Temp 98.0°F | Resp 18 | Wt 158.9 lb

## 2022-07-24 DIAGNOSIS — D469 Myelodysplastic syndrome, unspecified: Secondary | ICD-10-CM

## 2022-07-24 DIAGNOSIS — Z95828 Presence of other vascular implants and grafts: Secondary | ICD-10-CM

## 2022-07-24 DIAGNOSIS — Z5111 Encounter for antineoplastic chemotherapy: Secondary | ICD-10-CM | POA: Diagnosis not present

## 2022-07-24 MED ORDER — SODIUM CHLORIDE 0.9 % IV SOLN: Freq: Once | INTRAVENOUS | Status: AC

## 2022-07-24 MED ORDER — PALONOSETRON HCL INJECTION 0.25 MG/5ML
0.2500 mg | Freq: Once | INTRAVENOUS | Status: AC
  Administered 2022-07-24: 0.25 mg via INTRAVENOUS
  Filled 2022-07-24: qty 5

## 2022-07-24 MED ORDER — ALTEPLASE 2 MG IJ SOLR
2.0000 mg | Freq: Once | INTRAMUSCULAR | Status: DC
  Filled 2022-07-24: qty 2

## 2022-07-24 MED ORDER — SODIUM CHLORIDE 0.9% FLUSH
10.0000 mL | INTRAVENOUS | Status: DC | PRN
  Administered 2022-07-24: 10 mL

## 2022-07-24 MED ORDER — HEPARIN SOD (PORK) LOCK FLUSH 100 UNIT/ML IV SOLN
500.0000 [IU] | Freq: Once | INTRAVENOUS | Status: AC | PRN
  Administered 2022-07-24: 500 [IU]

## 2022-07-24 MED ORDER — SODIUM CHLORIDE 0.9 % IV SOLN
10.0000 mg | Freq: Once | INTRAVENOUS | Status: AC
  Administered 2022-07-24: 10 mg via INTRAVENOUS
  Filled 2022-07-24: qty 10

## 2022-07-24 MED ORDER — SODIUM CHLORIDE 0.9 % IV SOLN
75.0000 mg/m2 | Freq: Once | INTRAVENOUS | Status: AC
  Administered 2022-07-24: 140 mg via INTRAVENOUS
  Filled 2022-07-24: qty 14

## 2022-07-24 NOTE — Patient Instructions (Signed)
[facility] [place]  Discharge Instructions: Thank you for choosing [facility] to provide your oncology and hematology care.  If you have a lab appointment with the Cancer Center - please note that after April 8th, 2024, all labs will be drawn in the cancer center.  You do not have to check in or register with the main entrance as you have in the past but will complete your check-in in the cancer center.  Wear comfortable clothing and clothing appropriate for easy access to any Portacath or PICC line.   We strive to give you quality time with your provider. You may need to reschedule your appointment if you arrive late (15 or more minutes).  Arriving late affects you and other patients whose appointments are after yours.  Also, if you miss three or more appointments without notifying the office, you may be dismissed from the clinic at the provider's discretion.      For prescription refill requests, have your pharmacy contact our office and allow 72 hours for refills to be completed.    Today you received the following chemotherapy and/or immunotherapy agents Vidaza   To help prevent nausea and vomiting after your treatment, we encourage you to take your nausea medication as directed.  BELOW ARE SYMPTOMS THAT SHOULD BE REPORTED IMMEDIATELY: *FEVER GREATER THAN 100.4 F (38 C) OR HIGHER *CHILLS OR SWEATING *NAUSEA AND VOMITING THAT IS NOT CONTROLLED WITH YOUR NAUSEA MEDICATION *UNUSUAL SHORTNESS OF BREATH *UNUSUAL BRUISING OR BLEEDING *URINARY PROBLEMS (pain or burning when urinating, or frequent urination) *BOWEL PROBLEMS (unusual diarrhea, constipation, pain near the anus) TENDERNESS IN MOUTH AND THROAT WITH OR WITHOUT PRESENCE OF ULCERS (sore throat, sores in mouth, or a toothache) UNUSUAL RASH, SWELLING OR PAIN  UNUSUAL VAGINAL DISCHARGE OR ITCHING   Items with * indicate a potential emergency and should be followed up as soon as possible or go to the  Emergency Department if any problems should occur.  Please show the CHEMOTHERAPY ALERT CARD or IMMUNOTHERAPY ALERT CARD at check-in to the Emergency Department and triage nurse.  Should you have questions after your visit or need to cancel or reschedule your appointment, please contact [facility] [phone]  and follow the prompts.  Office hours are 8:00 a.m. to 4:30 p.m. Monday - Friday. Please note that voicemails left after 4:00 p.m. may not be returned until the following business day.  We are closed weekends and major holidays. You have access to a nurse at all times for urgent questions. Please call the main number to the clinic [phone] and follow the prompts.  For any non-urgent questions, you may also contact your provider using MyChart. We now offer e-Visits for anyone 18 and older to request care online for non-urgent symptoms. For details visit [email or web address].   Also download the MyChart app! Go to the app store, search "MyChart", open the app, select [facility], and log in with your MyChart username and password.   

## 2022-07-24 NOTE — Progress Notes (Signed)
Patient presents today for Vidaza infusion per providers order.  Vital signs within parameters for treatment.  Patient has no new complaints at this time.  Treatment given today per MD orders.  Stable during infusion without adverse affects.  Vital signs stable.  No complaints at this time.  Discharge from clinic ambulatory in stable condition.  Alert and oriented X 3.  Follow up with [facility] as scheduled.  

## 2022-07-25 ENCOUNTER — Inpatient Hospital Stay: Payer: Medicare HMO

## 2022-07-25 VITALS — BP 145/92 | HR 64 | Temp 97.6°F | Resp 18

## 2022-07-25 DIAGNOSIS — Z5111 Encounter for antineoplastic chemotherapy: Secondary | ICD-10-CM | POA: Diagnosis not present

## 2022-07-25 DIAGNOSIS — Z95828 Presence of other vascular implants and grafts: Secondary | ICD-10-CM

## 2022-07-25 DIAGNOSIS — D469 Myelodysplastic syndrome, unspecified: Secondary | ICD-10-CM

## 2022-07-25 MED ORDER — SODIUM CHLORIDE 0.9 % IV SOLN
10.0000 mg | Freq: Once | INTRAVENOUS | Status: AC
  Administered 2022-07-25: 10 mg via INTRAVENOUS
  Filled 2022-07-25: qty 10

## 2022-07-25 MED ORDER — SODIUM CHLORIDE 0.9% FLUSH
10.0000 mL | INTRAVENOUS | Status: DC | PRN
  Administered 2022-07-25: 10 mL

## 2022-07-25 MED ORDER — SODIUM CHLORIDE 0.9 % IV SOLN: Freq: Once | INTRAVENOUS | Status: AC

## 2022-07-25 MED ORDER — SODIUM CHLORIDE 0.9 % IV SOLN
75.0000 mg/m2 | Freq: Once | INTRAVENOUS | Status: AC
  Administered 2022-07-25: 140 mg via INTRAVENOUS
  Filled 2022-07-25: qty 14

## 2022-07-25 MED ORDER — HEPARIN SOD (PORK) LOCK FLUSH 100 UNIT/ML IV SOLN
500.0000 [IU] | Freq: Once | INTRAVENOUS | Status: AC | PRN
  Administered 2022-07-25: 500 [IU]

## 2022-07-25 NOTE — Progress Notes (Signed)
Vidaza given today per MD orders. Tolerated infusion without adverse affects. Vital signs stable. No complaints at this time. Discharged from clinic ambulatory in stable condition. Alert and oriented x 3. F/U with [facility] as scheduled.   

## 2022-07-25 NOTE — Patient Instructions (Signed)
[facility]   Discharge Instructions: Thank you for choosing [facility] to provide your oncology and hematology care.  If you have a lab appointment with the Cancer Center - please note that after April 8th, 2024, all labs will be drawn in the cancer center.  You do not have to check in or register with the main entrance as you have in the past but will complete your check-in in the cancer center.  Wear comfortable clothing and clothing appropriate for easy access to any Portacath or PICC line.   We strive to give you quality time with your provider. You may need to reschedule your appointment if you arrive late (15 or more minutes).  Arriving late affects you and other patients whose appointments are after yours.  Also, if you miss three or more appointments without notifying the office, you may be dismissed from the clinic at the provider's discretion.      For prescription refill requests, have your pharmacy contact our office and allow 72 hours for refills to be completed.    Today you received the following chemotherapy and/or immunotherapy agents Vidaza.  Azacitidine Injection What is this medication? AZACITIDINE (ay za SITE i deen) treats blood and bone marrow cancers. It works by slowing down the growth of cancer cells. This medicine may be used for other purposes; ask your health care provider or pharmacist if you have questions. COMMON BRAND NAME(S): Vidaza What should I tell my care team before I take this medication? They need to know if you have any of these conditions: Kidney disease Liver disease Low blood cell levels, such as low white cells, platelets, or red blood cells Low levels of albumin in the blood Low levels of bicarbonate in the blood An unusual or allergic reaction to azacitidine, mannitol, other medications, foods, dyes, or preservatives If you or your partner are pregnant or trying to get pregnant Breast-feeding How should I  use this medication? This medication is injected into a vein or under the skin. It is given by your care team in a hospital or clinic setting. Talk to your care team about the use of this medication in children. While it may be prescribed for children as young as 1 month for selected conditions, precautions do apply. Overdosage: If you think you have taken too much of this medicine contact a poison control center or emergency room at once. NOTE: This medicine is only for you. Do not share this medicine with others. What if I miss a dose? Keep appointments for follow-up doses. It is important not to miss your dose. Call your care team if you are unable to keep an appointment. What may interact with this medication? Interactions are not expected. This list may not describe all possible interactions. Give your health care provider a list of all the medicines, herbs, non-prescription drugs, or dietary supplements you use. Also tell them if you smoke, drink alcohol, or use illegal drugs. Some items may interact with your medicine. What should I watch for while using this medication? Your condition will be monitored carefully while you are receiving this medication. You may need blood work while taking this medication. This medication may make you feel generally unwell. This is not uncommon as chemotherapy can affect healthy cells as well as cancer cells. Report any side effects. Continue your course of treatment even though you feel ill unless your care team tells you to stop. Other product types may be available that contain the   medication azacitidine. The injection and oral products should not be used in place of one another. Talk to your care team if you have questions. This medication can cause serious side effects. To reduce the risk, your care team may give you other medications to take before receiving this one. Be sure to follow the directions from your care team. This medication may increase your  risk of getting an infection. Call your care team for advice if you get a fever, chills, sore throat, or other symptoms of a cold or flu. Do not treat yourself. Try to avoid being around people who are sick. Avoid taking medications that contain aspirin, acetaminophen, ibuprofen, naproxen, or ketoprofen unless instructed by your care team. These medications may hide a fever. This medication may increase your risk to bruise or bleed. Call your care team if you notice any unusual bleeding. Be careful brushing or flossing your teeth or using a toothpick because you may get an infection or bleed more easily. If you have any dental work done, tell your dentist you are receiving this medication. Talk to your care team if you or your partner may be pregnant. Serious birth defects can occur if you take this medication during pregnancy and for 6 months after the last dose. You will need a negative pregnancy test before starting this medication. Contraception is recommended while taking his medication and for 6 months after the last dose. Your care team can help you find the option that works for you. If your partner can get pregnant, use a condom during sex while taking this medication and for 3 months after the last dose. Do not breastfeed while taking this medication and for 1 week after the last dose. This medication may cause infertility. Talk to your care team if you are concerned about your fertility. What side effects may I notice from receiving this medication? Side effects that you should report to your care team as soon as possible: Allergic reactions--skin rash, itching, hives, swelling of the face, lips, tongue, or throat Infection--fever, chills, cough, sore throat, wounds that don't heal, pain or trouble when passing urine, general feeling of discomfort or being unwell Kidney injury--decrease in the amount of urine, swelling of the ankles, hands, or feet Liver injury--right upper belly pain, loss  of appetite, nausea, light-colored stool, dark yellow or brown urine, yellowing skin or eyes, unusual weakness or fatigue Low red blood cell level--unusual weakness or fatigue, dizziness, headache, trouble breathing Tumor lysis syndrome (TLS)--nausea, vomiting, diarrhea, decrease in the amount of urine, dark urine, unusual weakness or fatigue, confusion, muscle pain or cramps, fast or irregular heartbeat, joint pain Unusual bruising or bleeding Side effects that usually do not require medical attention (report to your care team if they continue or are bothersome): Constipation Diarrhea Nausea Pain, redness, or irritation at injection site Vomiting This list may not describe all possible side effects. Call your doctor for medical advice about side effects. You may report side effects to FDA at 1-800-FDA-1088. Where should I keep my medication? This medication is given in a hospital or clinic. It will not be stored at home. NOTE: This sheet is a summary. It may not cover all possible information. If you have questions about this medicine, talk to your doctor, pharmacist, or health care provider.  2023 Elsevier/Gold Standard (2021-07-19 00:00:00)        To help prevent nausea and vomiting after your treatment, we encourage you to take your nausea medication as directed.  BELOW ARE SYMPTOMS   THAT SHOULD BE REPORTED IMMEDIATELY: *FEVER GREATER THAN 100.4 F (38 C) OR HIGHER *CHILLS OR SWEATING *NAUSEA AND VOMITING THAT IS NOT CONTROLLED WITH YOUR NAUSEA MEDICATION *UNUSUAL SHORTNESS OF BREATH *UNUSUAL BRUISING OR BLEEDING *URINARY PROBLEMS (pain or burning when urinating, or frequent urination) *BOWEL PROBLEMS (unusual diarrhea, constipation, pain near the anus) TENDERNESS IN MOUTH AND THROAT WITH OR WITHOUT PRESENCE OF ULCERS (sore throat, sores in mouth, or a toothache) UNUSUAL RASH, SWELLING OR PAIN  UNUSUAL VAGINAL DISCHARGE OR ITCHING   Items with * indicate a potential emergency and  should be followed up as soon as possible or go to the Emergency Department if any problems should occur.  Please show the CHEMOTHERAPY ALERT CARD or IMMUNOTHERAPY ALERT CARD at check-in to the Emergency Department and triage nurse.  Should you have questions after your visit or need to cancel or reschedule your appointment, please contact [facility] [place] [phone]  and follow the prompts.  Office hours are 8:00 a.m. to 4:30 p.m. Monday - Friday. Please note that voicemails left after 4:00 p.m. may not be returned until the following business day.  We are closed weekends and major holidays. You have access to a nurse at all times for urgent questions. Please call the main number to the clinic [phone] and follow the prompts.  For any non-urgent questions, you may also contact your provider using MyChart. We now offer e-Visits for anyone 18 and older to request care online for non-urgent symptoms. For details visit [email or web address].   Also download the MyChart app! Go to the app store, search "MyChart", open the app, select [place], and log in with your MyChart username and password.   

## 2022-07-26 ENCOUNTER — Inpatient Hospital Stay: Payer: Medicare HMO

## 2022-07-26 VITALS — BP 131/90 | HR 55 | Temp 97.5°F | Resp 18

## 2022-07-26 DIAGNOSIS — D469 Myelodysplastic syndrome, unspecified: Secondary | ICD-10-CM

## 2022-07-26 DIAGNOSIS — Z95828 Presence of other vascular implants and grafts: Secondary | ICD-10-CM

## 2022-07-26 DIAGNOSIS — Z5111 Encounter for antineoplastic chemotherapy: Secondary | ICD-10-CM | POA: Diagnosis not present

## 2022-07-26 MED ORDER — PALONOSETRON HCL INJECTION 0.25 MG/5ML
0.2500 mg | Freq: Once | INTRAVENOUS | Status: AC
  Administered 2022-07-26: 0.25 mg via INTRAVENOUS
  Filled 2022-07-26: qty 5

## 2022-07-26 MED ORDER — SODIUM CHLORIDE 0.9 % IV SOLN: Freq: Once | INTRAVENOUS | Status: AC

## 2022-07-26 MED ORDER — SODIUM CHLORIDE 0.9 % IV SOLN
75.0000 mg/m2 | Freq: Once | INTRAVENOUS | Status: AC
  Administered 2022-07-26: 140 mg via INTRAVENOUS
  Filled 2022-07-26: qty 14

## 2022-07-26 MED ORDER — HEPARIN SOD (PORK) LOCK FLUSH 100 UNIT/ML IV SOLN
500.0000 [IU] | Freq: Once | INTRAVENOUS | Status: AC | PRN
  Administered 2022-07-26: 500 [IU]

## 2022-07-26 MED ORDER — SODIUM CHLORIDE 0.9% FLUSH
10.0000 mL | INTRAVENOUS | Status: DC | PRN
  Administered 2022-07-26: 10 mL

## 2022-07-26 MED ORDER — SODIUM CHLORIDE 0.9 % IV SOLN
10.0000 mg | Freq: Once | INTRAVENOUS | Status: AC
  Administered 2022-07-26: 10 mg via INTRAVENOUS
  Filled 2022-07-26: qty 10

## 2022-07-26 NOTE — Progress Notes (Signed)
Patient presents today for Vidaza infusion per providers order.  Vital signs within parameters for treatment.  Patient has no new complaints at this time.  Treatment given today per MD orders.  Stable during infusion without adverse affects.  Vital signs stable.  No complaints at this time.  Discharge from clinic ambulatory in stable condition.  Alert and oriented X 3.  Follow up with [facility] as scheduled.  

## 2022-07-26 NOTE — Patient Instructions (Signed)
[facility] [place]  Discharge Instructions: Thank you for choosing [facility] to provide your oncology and hematology care.  If you have a lab appointment with the Cancer Center - please note that after April 8th, 2024, all labs will be drawn in the cancer center.  You do not have to check in or register with the main entrance as you have in the past but will complete your check-in in the cancer center.  Wear comfortable clothing and clothing appropriate for easy access to any Portacath or PICC line.   We strive to give you quality time with your provider. You may need to reschedule your appointment if you arrive late (15 or more minutes).  Arriving late affects you and other patients whose appointments are after yours.  Also, if you miss three or more appointments without notifying the office, you may be dismissed from the clinic at the provider's discretion.      For prescription refill requests, have your pharmacy contact our office and allow 72 hours for refills to be completed.    Today you received the following chemotherapy and/or immunotherapy agents Vidaza   To help prevent nausea and vomiting after your treatment, we encourage you to take your nausea medication as directed.  BELOW ARE SYMPTOMS THAT SHOULD BE REPORTED IMMEDIATELY: *FEVER GREATER THAN 100.4 F (38 C) OR HIGHER *CHILLS OR SWEATING *NAUSEA AND VOMITING THAT IS NOT CONTROLLED WITH YOUR NAUSEA MEDICATION *UNUSUAL SHORTNESS OF BREATH *UNUSUAL BRUISING OR BLEEDING *URINARY PROBLEMS (pain or burning when urinating, or frequent urination) *BOWEL PROBLEMS (unusual diarrhea, constipation, pain near the anus) TENDERNESS IN MOUTH AND THROAT WITH OR WITHOUT PRESENCE OF ULCERS (sore throat, sores in mouth, or a toothache) UNUSUAL RASH, SWELLING OR PAIN  UNUSUAL VAGINAL DISCHARGE OR ITCHING   Items with * indicate a potential emergency and should be followed up as soon as possible or go to the  Emergency Department if any problems should occur.  Please show the CHEMOTHERAPY ALERT CARD or IMMUNOTHERAPY ALERT CARD at check-in to the Emergency Department and triage nurse.  Should you have questions after your visit or need to cancel or reschedule your appointment, please contact [facility] [place] [phone]  and follow the prompts.  Office hours are 8:00 a.m. to 4:30 p.m. Monday - Friday. Please note that voicemails left after 4:00 p.m. may not be returned until the following business day.  We are closed weekends and major holidays. You have access to a nurse at all times for urgent questions. Please call the main number to the clinic [phone] and follow the prompts.  For any non-urgent questions, you may also contact your provider using MyChart. We now offer e-Visits for anyone 18 and older to request care online for non-urgent symptoms. For details visit [email or web address].   Also download the MyChart app! Go to the app store, search "MyChart", open the app, select [place], and log in with your MyChart username and password.   

## 2022-08-06 ENCOUNTER — Other Ambulatory Visit: Payer: Self-pay

## 2022-08-09 ENCOUNTER — Other Ambulatory Visit: Payer: Self-pay

## 2022-08-09 ENCOUNTER — Other Ambulatory Visit (HOSPITAL_COMMUNITY): Payer: Self-pay

## 2022-08-09 ENCOUNTER — Other Ambulatory Visit: Payer: Self-pay | Admitting: Hematology

## 2022-08-09 DIAGNOSIS — D469 Myelodysplastic syndrome, unspecified: Secondary | ICD-10-CM

## 2022-08-09 MED ORDER — VENETOCLAX 100 MG PO TABS
200.0000 mg | ORAL_TABLET | Freq: Every day | ORAL | 0 refills | Status: DC
Start: 2022-08-09 — End: 2022-09-17
  Filled 2022-08-13: qty 28, 28d supply, fill #0

## 2022-08-13 ENCOUNTER — Other Ambulatory Visit: Payer: Self-pay

## 2022-08-13 ENCOUNTER — Other Ambulatory Visit (HOSPITAL_COMMUNITY): Payer: Self-pay

## 2022-08-16 ENCOUNTER — Other Ambulatory Visit: Payer: Self-pay

## 2022-08-23 ENCOUNTER — Other Ambulatory Visit (HOSPITAL_COMMUNITY): Payer: Self-pay

## 2022-09-01 NOTE — Progress Notes (Signed)
[facility] [address], [place] [address]    Clinic Day:  09/02/2022  Referring physician: Aliene Beams, MD  Patient Care Team: Aliene Beams, MD as PCP - General (Family Medicine) Aliene Beams, MD (Family Medicine) [place] Bali, MD (Inactive) as Consulting Physician (Gastroenterology) Doreatha Massed, MD as Medical Oncologist (Hematology)   ASSESSMENT & PLAN:   Assessment: 1. MDS with EB 2: - Seen at the request of Dr. Tracie Harrier for cytopenias - BMBX on 06/04/2021: Hypercellular marrow with dyspoietic changes involving the granulocytic cell line and megakaryocytes.  Myeloblasts 8% on aspirate smears and 10% by flow.  Cytogenetics 73, XY (20).  MDS FISH panel normal. 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 - IPSS-M score of 0.12, moderate high risk.  Leukemia free survival is 2.3 years.  Overall survival 2.8 years. - Cycle 1 of azacitidine on 07/30/2021.  BMBX (01/04/2022): Overall improvement in blast count and cellularity.  Chromosome analysis and FISH panel normal.  Venetoclax 200 mg day 1 through 14 added with cycle 8 on 02/18/2022. - BMBX (06/05/2022): Slightly hypercellular for age with very mild dyspoietic changes at best associated with increased number of eosinophilic cells.  No increase in blast cells identified. - Azacitidine 5 days and venetoclax 200 mg days 1-14 every 6 weeks on 07/22/2022   2. Social/family history: - He lives at home with his wife.  He swims about 40 minutes daily. 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  He also worked as a Geologist, engineering.  Non-smoker. - Paternal aunt had breast cancer and mother had pancreatic cancer.  3.  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 - He underwent thrombolytic therapy at [facility]. - He will followed up with vascular at [facility] and has been on Xarelto since then.    Plan: 1. MDS with EB 2: - He has tolerated  azacitidine and venetoclax very well.  No infections noted. - He had a dental crown placed, which was uneventful. - Reviewed labs today: CBC grossly normal with mild anemia stable.  LFTs with slightly elevated total bilirubin and normal creatinine. - Proceed with next cycle of azacitidine 75 mg/m x 5 days and he will start venetoclax today at 200 mg daily for days 1-14. - RTC 6 weeks for follow-up.   2. Unprovoked left subclavian DVT: - Xarelto discontinued due to hemorrhoidal bleeding.  No evidence of recurrence.   3. Constipation: - Continue Colace and MiraLAX.  He is not requiring lactulose.    No orders of the defined types were placed in this encounter.     I,Katie Daubenspeck,acting as a Neurosurgeon for Doreatha Massed, MD.,have documented all relevant documentation on the behalf of Doreatha Massed, MD,as directed by  Doreatha Massed, MD while in the presence of Doreatha Massed, MD.   I, Doreatha Massed MD, have reviewed the above documentation for accuracy and completeness, and I agree with the above.   Doreatha Massed, MD   6/17/202412:46 PM  CHIEF COMPLAINT:   Diagnosis: MDS with EB 2    Cancer Staging  No matching staging information was found for the patient.   Prior Therapy: none  Current Therapy:  Azacitidine IV D1-7 q28d     HISTORY OF PRESENT ILLNESS:   Oncology History  Myelodysplastic syndrome (HCC)  06/17/2021 Initial Diagnosis   Myelodysplastic syndrome (HCC)   07/30/2021 - 10/30/2021 Chemotherapy   Patient is on Treatment Plan : MYELODYSPLASIA  Azacitidine IV D1-7 q28d     07/30/2021 -  Chemotherapy   Patient is on Treatment Plan : MYELODYSPLASIA  Azacitidine IV D1-7 q28d        INTERVAL HISTORY:   Chad Lamb is a 77 y.o. male presenting to clinic today for follow up of MDS with EB 2. He was last seen by me on 07/22/22.  Today, he states that he is doing well overall. His appetite level is at 100%. His energy level is at  75%.  PAST MEDICAL HISTORY:   Past Medical History: Past Medical History:  Diagnosis Date   Allergy    Anxiety    Arthritis    Asthma    Cataract    Clotting disorder (HCC)    DVT (deep venous thrombosis) (HCC)    unknown etiology. [facility]   GERD (gastroesophageal reflux disease)    History of dental surgery    feb 2023   Hyperlipidemia    Hypertension    MDS (myelodysplastic syndrome), high grade (HCC) 06/17/2021   Pneumonia    Port-A-Cath in place 07/30/2021    Surgical History: Past Surgical History:  Procedure Laterality Date   CATARACT EXTRACTION W/PHACO Right 11/24/2017   Procedure: CATARACT EXTRACTION PHACO AND INTRAOCULAR LENS PLACEMENT RIGHT EYE;  Surgeon: Gemma Payor, MD;  Location: [facility];  Service: Ophthalmology;  Laterality: Right;  CDE: 8.61   CATARACT EXTRACTION W/PHACO Left 12/29/2017   Procedure: CATARACT EXTRACTION PHACO AND INTRAOCULAR LENS PLACEMENT (IOC);  Surgeon: Gemma Payor, MD;  Location: [facility];  Service: Ophthalmology;  Laterality: Left;  CDE: 7.21   GANGLION CYST EXCISION     HERNIA REPAIR     bilateral   IR BONE MARROW BIOPSY & ASPIRATION  06/05/2022   PARS PLANA VITRECTOMY Left 05/17/2021   Procedure: PARS PLANA VITRECTOMY 25 GAUGE WITH ANTIBIOTIC INJECTION  AND ENDOLASER LEFT EYE;  Surgeon: Carmela Rima, MD;  Location: [facility] OR;  Service: Ophthalmology;  Laterality: Left;   PORTACATH PLACEMENT Right 07/09/2021   Procedure: INSERTION PORT-A-CATH;  Surgeon: Franky Macho, MD;  Location: [facility];  Service: General;  Laterality: Right;   TONSILLECTOMY     VASCULAR SURGERY      Social History: Social History   Socioeconomic History   Marital status: Married    Spouse name: Harriett Sine   Number of children: 0   Years of education: 13   Highest education level: High school graduate  Occupational History   Not on file  Tobacco Use   Smoking status: Former    Types: Pipe    Quit date: 03/21/1966    Years since quitting: 56.4   Smokeless tobacco:  Never  Vaping Use   Vaping Use: Never used  Substance and Sexual Activity   Alcohol use: Yes    Alcohol/week: 1.0 standard drink of alcohol    Types: 1 Cans of beer per week    Comment: 1 can of beer a week   Drug use: Never   Sexual activity: Not Currently  Other Topics Concern   Not on file  Social History Narrative   ** Merged History Encounter **       Grew up in [place], [place] and [place].  Retired.  Worked in [place]. 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    Social Determinants of Health   Financial Resource Strain: Low Risk  (01/23/2017)   Overall Financial Resource Strain (CARDIA)    Difficulty of Paying Living Expenses: Not hard at  all  Food Insecurity: No Food Insecurity (01/23/2017)   Hunger Vital Sign    Worried About Running Out of Food in the Last Year: Never true    Ran Out of Food in the Last Year: Never true  Transportation Needs: No Transportation Needs (01/23/2017)   PRAPARE - Administrator, Civil Service (Medical): No    Lack of Transportation (Non-Medical): No  Physical Activity: Unknown (01/23/2017)   Exercise Vital Sign    Days of Exercise per Week: 3 days    Minutes of Exercise per Session: Not on file  Stress: Stress Concern Present (01/23/2017)   [employer] of Occupational Health - Occupational Stress Questionnaire    Feeling of Stress : To some extent  Social Connections: Somewhat Isolated (01/23/2017)   Social Connection and Isolation Panel [NHANES]    Frequency of Communication with Friends and Family: More than three times a week    Frequency of Social Gatherings with Friends and Family: More than three times a week    Attends Religious Services: Never    Database administrator or Organizations: No    Attends Banker Meetings: Never    Marital Status: Married  Catering manager Violence: Not At Risk (01/23/2017)   Humiliation, Afraid, Rape, and Kick questionnaire     Fear of Current or Ex-Partner: No    Emotionally Abused: No    Physically Abused: No    Sexually Abused: No    Family History: Family History  Problem Relation Age of Onset   Cancer Mother        pancreatic   Arthritis Father    Early death Father 31       Lung infiltrate   Colon cancer Neg Hx    Colon polyps Neg Hx     Current Medications:  Current Outpatient Medications:    albuterol (PROVENTIL HFA;VENTOLIN HFA) 108 (90 Base) MCG/ACT inhaler, Inhale 1-2 puffs into the lungs every 6 (six) hours as needed for wheezing or shortness of breath., Disp: , Rfl:    ALPRAZolam (XANAX) 0.5 MG tablet, Take 0.5 mg by mouth 2 (two) times daily as needed for anxiety., Disp: , Rfl:    amLODipine (NORVASC) 2.5 MG tablet, Take 2.5 mg by mouth daily., Disp: , Rfl:    azaCITIDine 5 mg/2 mLs in lactated ringers infusion, Inject into the vein daily. Days 1-7 every 28 days, Disp: , Rfl:    DENTA 5000 PLUS 1.1 % CREA dental cream, Take by mouth., Disp: , Rfl:    fluticasone furoate-vilanterol (BREO ELLIPTA) 200-25 MCG/ACT AEPB, Inhale 1 puff into the lungs daily., Disp: , Rfl:    Lactulose 20 GM/30ML SOLN, Take 30 mLs (20 g total) by mouth at bedtime., Disp: 473 mL, Rfl: 3   Lidocaine-Hydrocort, Perianal, 3-0.5 % CREA, Apply topically 2 (two) times daily as needed., Disp: , Rfl:    Lidocaine-Hydrocortisone Ace 3-0.5 % CREA, Apply 1 application topically as directed. (Patient taking differently: Apply 1 application  topically 2 (two) times daily as needed (hemorrhoids).), Disp: 30 Tube, Rfl: 11   lidocaine-prilocaine (EMLA) cream, Apply a SMALL AMOUNT TO port a cath site (DO not RUB in) AND cover with PLASTIC WRAP ONE hour prior TO infusion appointment, Disp: 30 g, Rfl: 3   loratadine (CLARITIN) 10 MG tablet, Take 10 mg by mouth daily., Disp: , Rfl:    Lutein 40 MG CAPS, Take 40 mg by mouth daily., Disp: , Rfl:    Multiple Vitamins-Minerals (CENTRUM ADULTS PO),  Take 1 tablet by mouth daily., Disp: , Rfl:     nystatin cream (MYCOSTATIN), Apply 1 application. topically 2 (two) times daily as needed for dry skin., Disp: , Rfl:    ofloxacin (OCUFLOX) 0.3 % ophthalmic solution, Place 1 drop into the left eye See admin instructions. 1 drop to left eye 4 x daily for 7 days after monthly procedure., Disp: , Rfl:    omeprazole (PRILOSEC OTC) 20 MG tablet, Take 5-10 mg by mouth daily., Disp: , Rfl:    sildenafil (VIAGRA) 50 MG tablet, Take 50 mg by mouth daily as needed for erectile dysfunction., Disp: , Rfl:    traMADol (ULTRAM) 50 MG tablet, Take 1 tablet (50 mg total) by mouth every 6 (six) hours as needed., Disp: 15 tablet, Rfl: 0   venetoclax (VENCLEXTA) 100 MG tablet, Take 2 tablets (200 mg total) by mouth daily. Take for 14 days, then hold for 14 days. Repeat every 28 days. Take with a meal and a full glass of water., Disp: 28 tablet, Rfl: 0 No current facility-administered medications for this visit.  Facility-Administered Medications Ordered in Other Visits:    azaCITIDine (VIDAZA) 140 mg in sodium chloride 0.9 % 50 mL chemo infusion, 75 mg/m2 (Treatment Plan Recorded), Intravenous, Once, Doreatha Massed, MD   dexamethasone (DECADRON) 10 mg in sodium chloride 0.9 % 50 mL IVPB, 10 mg, Intravenous, Once, Doreatha Massed, MD, Last Rate: 204 mL/hr at 09/02/22 1245, 10 mg at 09/02/22 1245   heparin lock flush 100 unit/mL, 500 Units, Intracatheter, Once PRN, Doreatha Massed, MD   sodium chloride flush (NS) 0.9 % injection 10 mL, 10 mL, Intracatheter, PRN, Doreatha Massed, MD, 10 mL at 06/12/22 1606   sodium chloride flush (NS) 0.9 % injection 10 mL, 10 mL, Intracatheter, PRN, Doreatha Massed, MD   Allergies: Allergies  Allergen Reactions   Cephalosporins Itching   Amoxapine And Related Itching and Rash    Has patient had a PCN reaction causing immediate rash, facial/tongue/throat swelling, SOB or lightheadedness with hypotension: No Has patient had a PCN reaction causing severe  rash involving mucus membranes or skin necrosis: No Has patient had a PCN reaction that required hospitalization: No Has patient had a PCN reaction occurring within the last 10 years: No If all of the above answers are "NO", then may proceed with Cephalosporin use.     Amoxicillin Rash   Cephalexin Rash   Clindamycin/Lincomycin Itching and Rash   Sulfamethoxazole Itching and Rash   Trimethoprim Rash    REVIEW OF SYSTEMS:   Review of Systems  Constitutional:  Negative for chills, fatigue and fever.  HENT:   Negative for lump/mass, mouth sores, nosebleeds, sore throat and trouble swallowing.   Eyes:  Negative for eye problems.  Respiratory:  Negative for cough and shortness of breath.   Cardiovascular:  Negative for chest pain, leg swelling and palpitations.  Gastrointestinal:  Negative for abdominal pain, constipation, diarrhea, nausea and vomiting.  Genitourinary:  Negative for bladder incontinence, difficulty urinating, dysuria, frequency, hematuria and nocturia.   Musculoskeletal:  Negative for arthralgias, back pain, flank pain, myalgias and neck pain.  Skin:  Negative for itching and rash.  Neurological:  Negative for dizziness, headaches and numbness.  Hematological:  Does not bruise/bleed easily.  Psychiatric/Behavioral:  Negative for depression, sleep disturbance and suicidal ideas. The patient is not nervous/anxious.   All other systems reviewed and are negative.    VITALS:   There were no vitals taken for this visit.  Wt Readings from  Last 3 Encounters:  09/02/22 155 lb 12.8 oz (70.7 kg)  07/24/22 158 lb 14.4 oz (72.1 kg)  07/22/22 157 lb 9.6 oz (71.5 kg)    There is no height or weight on file to calculate BMI.  Performance status (ECOG): 1 - Symptomatic but completely ambulatory  PHYSICAL EXAM:   Physical Exam Vitals and nursing note reviewed. Exam conducted with a chaperone present.  Constitutional:      Appearance: Normal appearance.  Cardiovascular:      Rate and Rhythm: Normal rate and regular rhythm.     Pulses: Normal pulses.     Heart sounds: Normal heart sounds.  Pulmonary:     Effort: Pulmonary effort is normal.     Breath sounds: Normal breath sounds.  Abdominal:     Palpations: Abdomen is soft. There is no hepatomegaly, splenomegaly or mass.     Tenderness: There is no abdominal tenderness.  Musculoskeletal:     Right lower leg: No edema.     Left lower leg: No edema.  Lymphadenopathy:     Cervical: No cervical adenopathy.     Right cervical: No superficial, deep or posterior cervical adenopathy.    Left cervical: No superficial, deep or posterior cervical adenopathy.     Upper Body:     Right upper body: No supraclavicular or axillary adenopathy.     Left upper body: No supraclavicular or axillary adenopathy.  Neurological:     General: No focal deficit present.     Mental Status: He is alert and oriented to person, place, and time.  Psychiatric:        Mood and Affect: Mood normal.        Behavior: Behavior normal.     LABS:      Latest Ref Rng & Units 09/02/2022   11:41 AM 07/22/2022    8:27 AM 06/10/2022   12:14 PM  CBC  WBC 4.0 - 10.5 K/uL 5.3  5.5  4.5   Hemoglobin 13.0 - 17.0 g/dL 16.1  09.6  04.5   Hematocrit 39.0 - 52.0 % 35.0  34.4  35.0   Platelets 150 - 400 K/uL 207  211  228       Latest Ref Rng & Units 09/02/2022   11:41 AM 07/22/2022    8:27 AM 06/10/2022   12:14 PM  CMP  Glucose 70 - 99 mg/dL 409  811  914   BUN 8 - 23 mg/dL 12  15  15    Creatinine 0.61 - 1.24 mg/dL 7.82  9.56  2.13   Sodium 135 - 145 mmol/L 139  138  138   Potassium 3.5 - 5.1 mmol/L 3.6  3.6  3.6   Chloride 98 - 111 mmol/L 106  106  105   CO2 22 - 32 mmol/L 24  25  25    Calcium 8.9 - 10.3 mg/dL 9.3  9.0  9.3   Total Protein 6.5 - 8.1 g/dL 5.8  6.0  5.9   Total Bilirubin 0.3 - 1.2 mg/dL 1.3  1.3  0.9   Alkaline Phos 38 - 126 U/L 56  61  66   AST 15 - 41 U/L 22  22  21    ALT 0 - 44 U/L 16  16  15       No results found for:  "CEA1", "CEA" / No results found for: "CEA1", "CEA" No results found for: "PSA1" No results found for: "YQM578" No results found for: "ION629"  Lab Results  Component Value  Date   TOTALPROTELP 6.3 01/05/2021   ALBUMINELP 3.3 01/05/2021   A1GS 0.3 01/05/2021   A2GS 0.8 01/05/2021   BETS 1.0 01/05/2021   GAMS 1.0 01/05/2021   MSPIKE Not Observed 01/05/2021   SPEI Comment 01/05/2021   No results found for: "TIBC", "FERRITIN", "IRONPCTSAT" Lab Results  Component Value Date   LDH 147 11/20/2021   LDH 143 10/22/2021   LDH 144 10/16/2021     STUDIES:   No results found.

## 2022-09-02 ENCOUNTER — Inpatient Hospital Stay: Payer: Medicare HMO

## 2022-09-02 ENCOUNTER — Inpatient Hospital Stay: Payer: Medicare HMO | Attending: Hematology

## 2022-09-02 ENCOUNTER — Inpatient Hospital Stay (HOSPITAL_BASED_OUTPATIENT_CLINIC_OR_DEPARTMENT_OTHER): Payer: Medicare HMO | Admitting: Hematology

## 2022-09-02 VITALS — BP 137/81 | HR 62 | Temp 97.9°F | Resp 18

## 2022-09-02 DIAGNOSIS — D469 Myelodysplastic syndrome, unspecified: Secondary | ICD-10-CM | POA: Diagnosis not present

## 2022-09-02 DIAGNOSIS — D4622 Refractory anemia with excess of blasts 2: Secondary | ICD-10-CM | POA: Diagnosis not present

## 2022-09-02 DIAGNOSIS — Z5111 Encounter for antineoplastic chemotherapy: Secondary | ICD-10-CM | POA: Diagnosis present

## 2022-09-02 DIAGNOSIS — Z95828 Presence of other vascular implants and grafts: Secondary | ICD-10-CM

## 2022-09-02 LAB — CBC WITH DIFFERENTIAL/PLATELET
Abs Immature Granulocytes: 0.01 10*3/uL (ref 0.00–0.07)
Basophils Absolute: 0 10*3/uL (ref 0.0–0.1)
Basophils Relative: 1 %
Eosinophils Absolute: 0.5 10*3/uL (ref 0.0–0.5)
Eosinophils Relative: 9 %
HCT: 35 % — ABNORMAL LOW (ref 39.0–52.0)
Hemoglobin: 11.1 g/dL — ABNORMAL LOW (ref 13.0–17.0)
Immature Granulocytes: 0 %
Lymphocytes Relative: 21 %
Lymphs Abs: 1.1 10*3/uL (ref 0.7–4.0)
MCH: 25.5 pg — ABNORMAL LOW (ref 26.0–34.0)
MCHC: 31.7 g/dL (ref 30.0–36.0)
MCV: 80.5 fL (ref 80.0–100.0)
Monocytes Absolute: 0.6 10*3/uL (ref 0.1–1.0)
Monocytes Relative: 12 %
Neutro Abs: 3.1 10*3/uL (ref 1.7–7.7)
Neutrophils Relative %: 57 %
Platelets: 207 10*3/uL (ref 150–400)
RBC: 4.35 MIL/uL (ref 4.22–5.81)
RDW: 18.2 % — ABNORMAL HIGH (ref 11.5–15.5)
WBC: 5.3 10*3/uL (ref 4.0–10.5)
nRBC: 0 % (ref 0.0–0.2)

## 2022-09-02 LAB — COMPREHENSIVE METABOLIC PANEL
ALT: 16 U/L (ref 0–44)
AST: 22 U/L (ref 15–41)
Albumin: 3.8 g/dL (ref 3.5–5.0)
Alkaline Phosphatase: 56 U/L (ref 38–126)
Anion gap: 9 (ref 5–15)
BUN: 12 mg/dL (ref 8–23)
CO2: 24 mmol/L (ref 22–32)
Calcium: 9.3 mg/dL (ref 8.9–10.3)
Chloride: 106 mmol/L (ref 98–111)
Creatinine, Ser: 0.93 mg/dL (ref 0.61–1.24)
GFR, Estimated: >60 mL/min (ref >60)
Glucose, Bld: 120 mg/dL — ABNORMAL HIGH (ref 70–99)
Potassium: 3.6 mmol/L (ref 3.5–5.1)
Sodium: 139 mmol/L (ref 135–145)
Total Bilirubin: 1.3 mg/dL — ABNORMAL HIGH (ref 0.3–1.2)
Total Protein: 5.8 g/dL — ABNORMAL LOW (ref 6.5–8.1)

## 2022-09-02 LAB — MAGNESIUM: Magnesium: 2.1 mg/dL (ref 1.7–2.4)

## 2022-09-02 MED ORDER — SODIUM CHLORIDE 0.9% FLUSH
10.0000 mL | INTRAVENOUS | Status: DC | PRN
  Administered 2022-09-02: 10 mL

## 2022-09-02 MED ORDER — HEPARIN SOD (PORK) LOCK FLUSH 100 UNIT/ML IV SOLN
500.0000 [IU] | Freq: Once | INTRAVENOUS | Status: AC | PRN
  Administered 2022-09-02: 500 [IU]

## 2022-09-02 MED ORDER — SODIUM CHLORIDE 0.9 % IV SOLN
75.0000 mg/m2 | Freq: Once | INTRAVENOUS | Status: AC
  Administered 2022-09-02: 140 mg via INTRAVENOUS
  Filled 2022-09-02: qty 14

## 2022-09-02 MED ORDER — SODIUM CHLORIDE 0.9 % IV SOLN: Freq: Once | INTRAVENOUS | Status: AC

## 2022-09-02 MED ORDER — SODIUM CHLORIDE 0.9% FLUSH
10.0000 mL | INTRAVENOUS | Status: DC | PRN
  Administered 2022-09-02: 10 mL via INTRAVENOUS

## 2022-09-02 MED ORDER — SODIUM CHLORIDE 0.9 % IV SOLN
10.0000 mg | Freq: Once | INTRAVENOUS | Status: AC
  Administered 2022-09-02: 10 mg via INTRAVENOUS
  Filled 2022-09-02: qty 10

## 2022-09-02 MED ORDER — PALONOSETRON HCL INJECTION 0.25 MG/5ML
0.2500 mg | Freq: Once | INTRAVENOUS | Status: AC
  Administered 2022-09-02: 0.25 mg via INTRAVENOUS
  Filled 2022-09-02: qty 5

## 2022-09-02 NOTE — Progress Notes (Signed)
Patient has been assessed, vital signs and labs have been reviewed by Dr. Katragadda. ANC, Creatinine, LFTs, and Platelets are within treatment parameters per Dr. Katragadda. The patient is good to proceed with treatment at this time. Primary RN and pharmacy aware.  

## 2022-09-02 NOTE — Patient Instructions (Signed)
[facility]  Discharge Instructions  You were seen and examined today by Dr. Ellin Saba.  Proceed with treatment as planned.  Follow-up as scheduled.  Thank you for choosing [facility] to provide your oncology and hematology care.   To afford each patient quality time with our provider, please arrive at least 15 minutes before your scheduled appointment time. You may need to reschedule your appointment if you arrive late (10 or more minutes). Arriving late affects you and other patients whose appointments are after yours.  Also, if you miss three or more appointments without notifying the office, you may be dismissed from the clinic at the provider's discretion.    Again, thank you for choosing [facility].  Our hope is that these requests will decrease the amount of time that you wait before being seen by our physicians.   If you have a lab appointment with the Cancer Center - please note that after April 8th, all labs will be drawn in the cancer center.  You do not have to check in or register with the main entrance as you have in the past but will complete your check-in at the cancer center.            _____________________________________________________________  Should you have questions after your visit to [facility], please contact our office at [phone] and follow the prompts.  Our office hours are 8:00 a.m. to 4:30 p.m. Monday - Thursday and 8:00 a.m. to 2:30 p.m. Friday.  Please note that voicemails left after 4:00 p.m. may not be returned until the following business day.  We are closed weekends and all major holidays.  You do have access to a nurse 24-7, just call the main number to the clinic [phone] and do not press any options, hold on the line and a nurse will answer the phone.    For prescription refill requests, have your pharmacy contact our office and allow 72 hours.    Masks are  no longer required in the cancer centers. If you would like for your care team to wear a mask while they are taking care of you, please let them know. You may have one support person who is at least 78 years old accompany you for your appointments.

## 2022-09-02 NOTE — Patient Instructions (Signed)
[facility] [place]  Discharge Instructions: Thank you for choosing [facility] to provide your oncology and hematology care.  If you have a lab appointment with the Cancer Center - please note that after April 8th, 2024, all labs will be drawn in the cancer center.  You do not have to check in or register with the main entrance as you have in the past but will complete your check-in in the cancer center.  Wear comfortable clothing and clothing appropriate for easy access to any Portacath or PICC line.   We strive to give you quality time with your provider. You may need to reschedule your appointment if you arrive late (15 or more minutes).  Arriving late affects you and other patients whose appointments are after yours.  Also, if you miss three or more appointments without notifying the office, you may be dismissed from the clinic at the provider's discretion.      For prescription refill requests, have your pharmacy contact our office and allow 72 hours for refills to be completed.    Today you received the following chemotherapy and/or immunotherapy agents D1 Vidaza   To help prevent nausea and vomiting after your treatment, we encourage you to take your nausea medication as directed.  Azacitidine Injection What is this medication? AZACITIDINE (ay za SITE i deen) treats blood and bone marrow cancers. It works by slowing down the growth of cancer cells. This medicine may be used for other purposes; ask your health care provider or pharmacist if you have questions. COMMON BRAND NAME(S): Vidaza What should I tell my care team before I take this medication? They need to know if you have any of these conditions: Kidney disease Liver disease Low blood cell levels, such as low white cells, platelets, or red blood cells Low levels of albumin in the blood Low levels of bicarbonate in the blood An unusual or allergic reaction to azacitidine, mannitol, other  medications, foods, dyes, or preservatives If you or your partner are pregnant or trying to get pregnant Breast-feeding How should I use this medication? This medication is injected into a vein or under the skin. It is given by your care team in a hospital or clinic setting. Talk to your care team about the use of this medication in children. While it may be prescribed for children as young as 1 month for selected conditions, precautions do apply. Overdosage: If you think you have taken too much of this medicine contact a poison control center or emergency room at once. NOTE: This medicine is only for you. Do not share this medicine with others. What if I miss a dose? Keep appointments for follow-up doses. It is important not to miss your dose. Call your care team if you are unable to keep an appointment. What may interact with this medication? Interactions are not expected. This list may not describe all possible interactions. Give your health care provider a list of all the medicines, herbs, non-prescription drugs, or dietary supplements you use. Also tell them if you smoke, drink alcohol, or use illegal drugs. Some items may interact with your medicine. What should I watch for while using this medication? Your condition will be monitored carefully while you are receiving this medication. You may need blood work while taking this medication. This medication may make you feel generally unwell. This is not uncommon as chemotherapy can affect healthy cells as well as cancer cells. Report any side effects. Continue your course of treatment   even though you feel ill unless your care team tells you to stop. Other product types may be available that contain the medication azacitidine. The injection and oral products should not be used in place of one another. Talk to your care team if you have questions. This medication can cause serious side effects. To reduce the risk, your care team may give you other  medications to take before receiving this one. Be sure to follow the directions from your care team. This medication may increase your risk of getting an infection. Call your care team for advice if you get a fever, chills, sore throat, or other symptoms of a cold or flu. Do not treat yourself. Try to avoid being around people who are sick. Avoid taking medications that contain aspirin, acetaminophen, ibuprofen, naproxen, or ketoprofen unless instructed by your care team. These medications may hide a fever. This medication may increase your risk to bruise or bleed. Call your care team if you notice any unusual bleeding. Be careful brushing or flossing your teeth or using a toothpick because you may get an infection or bleed more easily. If you have any dental work done, tell your dentist you are receiving this medication. Talk to your care team if you or your partner may be pregnant. Serious birth defects can occur if you take this medication during pregnancy and for 6 months after the last dose. You will need a negative pregnancy test before starting this medication. Contraception is recommended while taking his medication and for 6 months after the last dose. Your care team can help you find the option that works for you. If your partner can get pregnant, use a condom during sex while taking this medication and for 3 months after the last dose. Do not breastfeed while taking this medication and for 1 week after the last dose. This medication may cause infertility. Talk to your care team if you are concerned about your fertility. What side effects may I notice from receiving this medication? Side effects that you should report to your care team as soon as possible: Allergic reactions--skin rash, itching, hives, swelling of the face, lips, tongue, or throat Infection--fever, chills, cough, sore throat, wounds that don't heal, pain or trouble when passing urine, general feeling of discomfort or being  unwell Kidney injury--decrease in the amount of urine, swelling of the ankles, hands, or feet Liver injury--right upper belly pain, loss of appetite, nausea, light-colored stool, dark yellow or brown urine, yellowing skin or eyes, unusual weakness or fatigue Low red blood cell level--unusual weakness or fatigue, dizziness, headache, trouble breathing Tumor lysis syndrome (TLS)--nausea, vomiting, diarrhea, decrease in the amount of urine, dark urine, unusual weakness or fatigue, confusion, muscle pain or cramps, fast or irregular heartbeat, joint pain Unusual bruising or bleeding Side effects that usually do not require medical attention (report to your care team if they continue or are bothersome): Constipation Diarrhea Nausea Pain, redness, or irritation at injection site Vomiting This list may not describe all possible side effects. Call your doctor for medical advice about side effects. You may report side effects to FDA at 1-800-FDA-1088. Where should I keep my medication? This medication is given in a hospital or clinic. It will not be stored at home. NOTE: This sheet is a summary. It may not cover all possible information. If you have questions about this medicine, talk to your doctor, pharmacist, or health care provider.  2024 Elsevier/Gold Standard (2021-07-19 00:00:00)   BELOW ARE SYMPTOMS THAT SHOULD BE   REPORTED IMMEDIATELY: *FEVER GREATER THAN 100.4 F (38 C) OR HIGHER *CHILLS OR SWEATING *NAUSEA AND VOMITING THAT IS NOT CONTROLLED WITH YOUR NAUSEA MEDICATION *UNUSUAL SHORTNESS OF BREATH *UNUSUAL BRUISING OR BLEEDING *URINARY PROBLEMS (pain or burning when urinating, or frequent urination) *BOWEL PROBLEMS (unusual diarrhea, constipation, pain near the anus) TENDERNESS IN MOUTH AND THROAT WITH OR WITHOUT PRESENCE OF ULCERS (sore throat, sores in mouth, or a toothache) UNUSUAL RASH, SWELLING OR PAIN  UNUSUAL VAGINAL DISCHARGE OR ITCHING   Items with * indicate a potential  emergency and should be followed up as soon as possible or go to the Emergency Department if any problems should occur.  Please show the CHEMOTHERAPY ALERT CARD or IMMUNOTHERAPY ALERT CARD at check-in to the Emergency Department and triage nurse.  Should you have questions after your visit or need to cancel or reschedule your appointment, please contact [facility] [phone]  and follow the prompts.  Office hours are 8:00 a.m. to 4:30 p.m. Monday - Friday. Please note that voicemails left after 4:00 p.m. may not be returned until the following business day.  We are closed weekends and major holidays. You have access to a nurse at all times for urgent questions. Please call the main number to the clinic [phone] and follow the prompts.  For any non-urgent questions, you may also contact your provider using MyChart. We now offer e-Visits for anyone 66 and older to request care online for non-urgent symptoms. For details visit [email or web address].   Also download the MyChart app! Go to the app store, search "MyChart", open the app, select [place], and log in with your MyChart username and password.

## 2022-09-02 NOTE — Progress Notes (Signed)
Patient presents today for D1 Vidaza infusion. Patient is in satisfactory condition with no new complaints voiced.  Vital signs are stable.  Labs reviewed by Dr. Ellin Saba during the office visit and all labs are within treatment parameters.  We will proceed with treatment per MD orders.   D1 Vidaza given today per MD orders. Tolerated infusion without adverse affects. Vital signs stable. No complaints at this time. Discharged from clinic ambulatory in stable condition. Alert and oriented x 3. F/U with [facility] as scheduled.

## 2022-09-02 NOTE — Progress Notes (Signed)
Patients port flushed without difficulty.  Good blood return noted with no bruising or swelling noted at site.  VSS. Patient remains accessed for treatment.  

## 2022-09-03 ENCOUNTER — Inpatient Hospital Stay: Payer: Medicare HMO

## 2022-09-03 VITALS — BP 147/84 | HR 60 | Temp 97.7°F | Resp 18

## 2022-09-03 DIAGNOSIS — Z95828 Presence of other vascular implants and grafts: Secondary | ICD-10-CM

## 2022-09-03 DIAGNOSIS — D469 Myelodysplastic syndrome, unspecified: Secondary | ICD-10-CM

## 2022-09-03 DIAGNOSIS — Z5111 Encounter for antineoplastic chemotherapy: Secondary | ICD-10-CM | POA: Diagnosis not present

## 2022-09-03 MED ORDER — SODIUM CHLORIDE 0.9% FLUSH
10.0000 mL | INTRAVENOUS | Status: DC | PRN
  Administered 2022-09-03: 10 mL

## 2022-09-03 MED ORDER — SODIUM CHLORIDE 0.9 % IV SOLN
75.0000 mg/m2 | Freq: Once | INTRAVENOUS | Status: AC
  Administered 2022-09-03: 140 mg via INTRAVENOUS
  Filled 2022-09-03: qty 14

## 2022-09-03 MED ORDER — SODIUM CHLORIDE 0.9 % IV SOLN: Freq: Once | INTRAVENOUS | Status: AC

## 2022-09-03 MED ORDER — SODIUM CHLORIDE 0.9 % IV SOLN
10.0000 mg | Freq: Once | INTRAVENOUS | Status: AC
  Administered 2022-09-03: 10 mg via INTRAVENOUS
  Filled 2022-09-03: qty 10

## 2022-09-03 MED ORDER — HEPARIN SOD (PORK) LOCK FLUSH 100 UNIT/ML IV SOLN
500.0000 [IU] | Freq: Once | INTRAVENOUS | Status: AC | PRN
  Administered 2022-09-03: 500 [IU]

## 2022-09-03 NOTE — Progress Notes (Signed)
Patient presents today for chemotherapy infusion of Vidaza.  Patient is in satisfactory condition with no new complaints voiced.  Vital signs are stable.  Labs reviewed and all labs are within treatment parameters.  We will proceed with treatment per MD orders.    Patient tolerated treatment well with no complaints voiced.  Patient left ambulatory in stable condition.  Vital signs stable at discharge.  Follow up as scheduled.    

## 2022-09-03 NOTE — Patient Instructions (Signed)
[facility] [place]  Discharge Instructions: Thank you for choosing [facility] to provide your oncology and hematology care.  If you have a lab appointment with the Cancer Center - please note that after April 8th, 2024, all labs will be drawn in the cancer center.  You do not have to check in or register with the main entrance as you have in the past but will complete your check-in in the cancer center.  Wear comfortable clothing and clothing appropriate for easy access to any Portacath or PICC line.   We strive to give you quality time with your provider. You may need to reschedule your appointment if you arrive late (15 or more minutes).  Arriving late affects you and other patients whose appointments are after yours.  Also, if you miss three or more appointments without notifying the office, you may be dismissed from the clinic at the provider's discretion.      For prescription refill requests, have your pharmacy contact our office and allow 72 hours for refills to be completed.    Today you received the following chemotherapy and/or immunotherapy agents Vidaza.  Azacitidine Injection What is this medication? AZACITIDINE (ay za SITE i deen) treats blood and bone marrow cancers. It works by slowing down the growth of cancer cells. This medicine may be used for other purposes; ask your health care provider or pharmacist if you have questions. COMMON BRAND NAME(S): Vidaza What should I tell my care team before I take this medication? They need to know if you have any of these conditions: Kidney disease Liver disease Low blood cell levels, such as low white cells, platelets, or red blood cells Low levels of albumin in the blood Low levels of bicarbonate in the blood An unusual or allergic reaction to azacitidine, mannitol, other medications, foods, dyes, or preservatives If you or your partner are pregnant or trying to get pregnant Breast-feeding How should I  use this medication? This medication is injected into a vein or under the skin. It is given by your care team in a hospital or clinic setting. Talk to your care team about the use of this medication in children. While it may be prescribed for children as young as 1 month for selected conditions, precautions do apply. Overdosage: If you think you have taken too much of this medicine contact a poison control center or emergency room at once. NOTE: This medicine is only for you. Do not share this medicine with others. What if I miss a dose? Keep appointments for follow-up doses. It is important not to miss your dose. Call your care team if you are unable to keep an appointment. What may interact with this medication? Interactions are not expected. This list may not describe all possible interactions. Give your health care provider a list of all the medicines, herbs, non-prescription drugs, or dietary supplements you use. Also tell them if you smoke, drink alcohol, or use illegal drugs. Some items may interact with your medicine. What should I watch for while using this medication? Your condition will be monitored carefully while you are receiving this medication. You may need blood work while taking this medication. This medication may make you feel generally unwell. This is not uncommon as chemotherapy can affect healthy cells as well as cancer cells. Report any side effects. Continue your course of treatment even though you feel ill unless your care team tells you to stop. Other product types may be available that contain the   medication azacitidine. The injection and oral products should not be used in place of one another. Talk to your care team if you have questions. This medication can cause serious side effects. To reduce the risk, your care team may give you other medications to take before receiving this one. Be sure to follow the directions from your care team. This medication may increase your  risk of getting an infection. Call your care team for advice if you get a fever, chills, sore throat, or other symptoms of a cold or flu. Do not treat yourself. Try to avoid being around people who are sick. Avoid taking medications that contain aspirin, acetaminophen, ibuprofen, naproxen, or ketoprofen unless instructed by your care team. These medications may hide a fever. This medication may increase your risk to bruise or bleed. Call your care team if you notice any unusual bleeding. Be careful brushing or flossing your teeth or using a toothpick because you may get an infection or bleed more easily. If you have any dental work done, tell your dentist you are receiving this medication. Talk to your care team if you or your partner may be pregnant. Serious birth defects can occur if you take this medication during pregnancy and for 6 months after the last dose. You will need a negative pregnancy test before starting this medication. Contraception is recommended while taking his medication and for 6 months after the last dose. Your care team can help you find the option that works for you. If your partner can get pregnant, use a condom during sex while taking this medication and for 3 months after the last dose. Do not breastfeed while taking this medication and for 1 week after the last dose. This medication may cause infertility. Talk to your care team if you are concerned about your fertility. What side effects may I notice from receiving this medication? Side effects that you should report to your care team as soon as possible: Allergic reactions--skin rash, itching, hives, swelling of the face, lips, tongue, or throat Infection--fever, chills, cough, sore throat, wounds that don't heal, pain or trouble when passing urine, general feeling of discomfort or being unwell Kidney injury--decrease in the amount of urine, swelling of the ankles, hands, or feet Liver injury--right upper belly pain, loss  of appetite, nausea, light-colored stool, dark yellow or brown urine, yellowing skin or eyes, unusual weakness or fatigue Low red blood cell level--unusual weakness or fatigue, dizziness, headache, trouble breathing Tumor lysis syndrome (TLS)--nausea, vomiting, diarrhea, decrease in the amount of urine, dark urine, unusual weakness or fatigue, confusion, muscle pain or cramps, fast or irregular heartbeat, joint pain Unusual bruising or bleeding Side effects that usually do not require medical attention (report to your care team if they continue or are bothersome): Constipation Diarrhea Nausea Pain, redness, or irritation at injection site Vomiting This list may not describe all possible side effects. Call your doctor for medical advice about side effects. You may report side effects to FDA at 1-800-FDA-1088. Where should I keep my medication? This medication is given in a hospital or clinic. It will not be stored at home. NOTE: This sheet is a summary. It may not cover all possible information. If you have questions about this medicine, talk to your doctor, pharmacist, or health care provider.  2024 Elsevier/Gold Standard (2021-07-19 00:00:00)        To help prevent nausea and vomiting after your treatment, we encourage you to take your nausea medication as directed.  BELOW ARE SYMPTOMS   THAT SHOULD BE REPORTED IMMEDIATELY: *FEVER GREATER THAN 100.4 F (38 C) OR HIGHER *CHILLS OR SWEATING *NAUSEA AND VOMITING THAT IS NOT CONTROLLED WITH YOUR NAUSEA MEDICATION *UNUSUAL SHORTNESS OF BREATH *UNUSUAL BRUISING OR BLEEDING *URINARY PROBLEMS (pain or burning when urinating, or frequent urination) *BOWEL PROBLEMS (unusual diarrhea, constipation, pain near the anus) TENDERNESS IN MOUTH AND THROAT WITH OR WITHOUT PRESENCE OF ULCERS (sore throat, sores in mouth, or a toothache) UNUSUAL RASH, SWELLING OR PAIN  UNUSUAL VAGINAL DISCHARGE OR ITCHING   Items with * indicate a potential emergency and  should be followed up as soon as possible or go to the Emergency Department if any problems should occur.  Please show the CHEMOTHERAPY ALERT CARD or IMMUNOTHERAPY ALERT CARD at check-in to the Emergency Department and triage nurse.  Should you have questions after your visit or need to cancel or reschedule your appointment, please contact [facility] [place] [phone]  and follow the prompts.  Office hours are 8:00 a.m. to 4:30 p.m. Monday - Friday. Please note that voicemails left after 4:00 p.m. may not be returned until the following business day.  We are closed weekends and major holidays. You have access to a nurse at all times for urgent questions. Please call the main number to the clinic [phone] and follow the prompts.  For any non-urgent questions, you may also contact your provider using MyChart. We now offer e-Visits for anyone 18 and older to request care online for non-urgent symptoms. For details visit [email or web address].   Also download the MyChart app! Go to the app store, search "MyChart", open the app, select [place], and log in with your MyChart username and password.   

## 2022-09-04 ENCOUNTER — Inpatient Hospital Stay: Payer: Medicare HMO

## 2022-09-04 VITALS — BP 143/89 | HR 60 | Temp 97.6°F | Resp 18

## 2022-09-04 DIAGNOSIS — Z5111 Encounter for antineoplastic chemotherapy: Secondary | ICD-10-CM | POA: Diagnosis not present

## 2022-09-04 DIAGNOSIS — Z95828 Presence of other vascular implants and grafts: Secondary | ICD-10-CM

## 2022-09-04 DIAGNOSIS — D469 Myelodysplastic syndrome, unspecified: Secondary | ICD-10-CM

## 2022-09-04 MED ORDER — HEPARIN SOD (PORK) LOCK FLUSH 100 UNIT/ML IV SOLN
500.0000 [IU] | Freq: Once | INTRAVENOUS | Status: AC | PRN
  Administered 2022-09-04: 500 [IU]

## 2022-09-04 MED ORDER — PALONOSETRON HCL INJECTION 0.25 MG/5ML
0.2500 mg | Freq: Once | INTRAVENOUS | Status: AC
  Administered 2022-09-04: 0.25 mg via INTRAVENOUS
  Filled 2022-09-04: qty 5

## 2022-09-04 MED ORDER — SODIUM CHLORIDE 0.9 % IV SOLN
10.0000 mg | Freq: Once | INTRAVENOUS | Status: AC
  Administered 2022-09-04: 10 mg via INTRAVENOUS
  Filled 2022-09-04: qty 10

## 2022-09-04 MED ORDER — SODIUM CHLORIDE 0.9 % IV SOLN
75.0000 mg/m2 | Freq: Once | INTRAVENOUS | Status: AC
  Administered 2022-09-04: 140 mg via INTRAVENOUS
  Filled 2022-09-04: qty 14

## 2022-09-04 MED ORDER — SODIUM CHLORIDE 0.9% FLUSH
10.0000 mL | INTRAVENOUS | Status: DC | PRN
  Administered 2022-09-04: 10 mL

## 2022-09-04 MED ORDER — SODIUM CHLORIDE 0.9 % IV SOLN: Freq: Once | INTRAVENOUS | Status: AC

## 2022-09-04 NOTE — Patient Instructions (Signed)
[facility]  Discharge Instructions: Thank you for choosing  Cancer Center to provide your oncology and hematology care.  If you have a lab appointment with the Cancer Center - please note that after April 8th, 2024, all labs will be drawn in the cancer center.  You do not have to check in or register with the main entrance as you have in the past but will complete your check-in in the cancer center.  Wear comfortable clothing and clothing appropriate for easy access to any Portacath or PICC line.   We strive to give you quality time with your provider. You may need to reschedule your appointment if you arrive late (15 or more minutes).  Arriving late affects you and other patients whose appointments are after yours.  Also, if you miss three or more appointments without notifying the office, you may be dismissed from the clinic at the provider's discretion.      For prescription refill requests, have your pharmacy contact our office and allow 72 hours for refills to be completed.    Today you received the following chemotherapy and/or immunotherapy agents Vidaza.  Azacitidine Injection What is this medication? AZACITIDINE (ay za SITE i deen) treats blood and bone marrow cancers. It works by slowing down the growth of cancer cells. This medicine may be used for other purposes; ask your health care provider or pharmacist if you have questions. COMMON BRAND NAME(S): Vidaza What should I tell my care team before I take this medication? They need to know if you have any of these conditions: Kidney disease Liver disease Low blood cell levels, such as low white cells, platelets, or red blood cells Low levels of albumin in the blood Low levels of bicarbonate in the blood An unusual or allergic reaction to azacitidine, mannitol, other medications, foods, dyes, or preservatives If you or your partner are pregnant or trying to get pregnant Breast-feeding How should I  use this medication? This medication is injected into a vein or under the skin. It is given by your care team in a hospital or clinic setting. Talk to your care team about the use of this medication in children. While it may be prescribed for children as young as 1 month for selected conditions, precautions do apply. Overdosage: If you think you have taken too much of this medicine contact a poison control center or emergency room at once. NOTE: This medicine is only for you. Do not share this medicine with others. What if I miss a dose? Keep appointments for follow-up doses. It is important not to miss your dose. Call your care team if you are unable to keep an appointment. What may interact with this medication? Interactions are not expected. This list may not describe all possible interactions. Give your health care provider a list of all the medicines, herbs, non-prescription drugs, or dietary supplements you use. Also tell them if you smoke, drink alcohol, or use illegal drugs. Some items may interact with your medicine. What should I watch for while using this medication? Your condition will be monitored carefully while you are receiving this medication. You may need blood work while taking this medication. This medication may make you feel generally unwell. This is not uncommon as chemotherapy can affect healthy cells as well as cancer cells. Report any side effects. Continue your course of treatment even though you feel ill unless your care team tells you to stop. Other product types may be available that contain the   medication azacitidine. The injection and oral products should not be used in place of one another. Talk to your care team if you have questions. This medication can cause serious side effects. To reduce the risk, your care team may give you other medications to take before receiving this one. Be sure to follow the directions from your care team. This medication may increase your  risk of getting an infection. Call your care team for advice if you get a fever, chills, sore throat, or other symptoms of a cold or flu. Do not treat yourself. Try to avoid being around people who are sick. Avoid taking medications that contain aspirin, acetaminophen, ibuprofen, naproxen, or ketoprofen unless instructed by your care team. These medications may hide a fever. This medication may increase your risk to bruise or bleed. Call your care team if you notice any unusual bleeding. Be careful brushing or flossing your teeth or using a toothpick because you may get an infection or bleed more easily. If you have any dental work done, tell your dentist you are receiving this medication. Talk to your care team if you or your partner may be pregnant. Serious birth defects can occur if you take this medication during pregnancy and for 6 months after the last dose. You will need a negative pregnancy test before starting this medication. Contraception is recommended while taking his medication and for 6 months after the last dose. Your care team can help you find the option that works for you. If your partner can get pregnant, use a condom during sex while taking this medication and for 3 months after the last dose. Do not breastfeed while taking this medication and for 1 week after the last dose. This medication may cause infertility. Talk to your care team if you are concerned about your fertility. What side effects may I notice from receiving this medication? Side effects that you should report to your care team as soon as possible: Allergic reactions--skin rash, itching, hives, swelling of the face, lips, tongue, or throat Infection--fever, chills, cough, sore throat, wounds that don't heal, pain or trouble when passing urine, general feeling of discomfort or being unwell Kidney injury--decrease in the amount of urine, swelling of the ankles, hands, or feet Liver injury--right upper belly pain, loss  of appetite, nausea, light-colored stool, dark yellow or [place] urine, yellowing skin or eyes, unusual weakness or fatigue Low red blood cell level--unusual weakness or fatigue, dizziness, headache, trouble breathing Tumor lysis syndrome (TLS)--nausea, vomiting, diarrhea, decrease in the amount of urine, dark urine, unusual weakness or fatigue, confusion, muscle pain or cramps, fast or irregular heartbeat, joint pain Unusual bruising or bleeding Side effects that usually do not require medical attention (report to your care team if they continue or are bothersome): Constipation Diarrhea Nausea Pain, redness, or irritation at injection site Vomiting This list may not describe all possible side effects. Call your doctor for medical advice about side effects. You may report side effects to FDA at 1-800-FDA-1088. Where should I keep my medication? This medication is given in a hospital or clinic. It will not be stored at home. NOTE: This sheet is a summary. It may not cover all possible information. If you have questions about this medicine, talk to your doctor, pharmacist, or health care provider.  2024 Elsevier/Gold Standard (2021-07-19 00:00:00)        To help prevent nausea and vomiting after your treatment, we encourage you to take your nausea medication as directed.  BELOW ARE SYMPTOMS   THAT SHOULD BE REPORTED IMMEDIATELY: *FEVER GREATER THAN 100.4 F (38 C) OR HIGHER *CHILLS OR SWEATING *NAUSEA AND VOMITING THAT IS NOT CONTROLLED WITH YOUR NAUSEA MEDICATION *UNUSUAL SHORTNESS OF BREATH *UNUSUAL BRUISING OR BLEEDING *URINARY PROBLEMS (pain or burning when urinating, or frequent urination) *BOWEL PROBLEMS (unusual diarrhea, constipation, pain near the anus) TENDERNESS IN MOUTH AND THROAT WITH OR WITHOUT PRESENCE OF ULCERS (sore throat, sores in mouth, or a toothache) UNUSUAL RASH, SWELLING OR PAIN  UNUSUAL VAGINAL DISCHARGE OR ITCHING   Items with * indicate a potential emergency and  should be followed up as soon as possible or go to the Emergency Department if any problems should occur.  Please show the CHEMOTHERAPY ALERT CARD or IMMUNOTHERAPY ALERT CARD at check-in to the Emergency Department and triage nurse.  Should you have questions after your visit or need to cancel or reschedule your appointment, please contact [facility] [phone]  and follow the prompts.  Office hours are 8:00 a.m. to 4:30 p.m. Monday - Friday. Please note that voicemails left after 4:00 p.m. may not be returned until the following business day.  We are closed weekends and major holidays. You have access to a nurse at all times for urgent questions. Please call the main number to the clinic [phone] and follow the prompts.  For any non-urgent questions, you may also contact your provider using MyChart. We now offer e-Visits for anyone 18 and older to request care online for non-urgent symptoms. For details visit [email or web address].   Also download the MyChart app! Go to the app store, search "MyChart", open the app, select , and log in with your MyChart username and password.   

## 2022-09-04 NOTE — Progress Notes (Signed)
Patient presents today for chemotherapy infusion.  Patient is in satisfactory condition with no new complaints voiced.  Vital signs are stable.  Labs from 09/02/22 reviewed.  We will proceed with treatment per MD orders.    Patient tolerated treatment well with no complaints voiced.  Patient left ambulatory in stable condition.  Vital signs stable at discharge.  Follow up as scheduled.

## 2022-09-05 ENCOUNTER — Inpatient Hospital Stay: Payer: Medicare HMO

## 2022-09-05 VITALS — BP 140/88 | HR 58 | Temp 97.5°F | Resp 16

## 2022-09-05 DIAGNOSIS — D469 Myelodysplastic syndrome, unspecified: Secondary | ICD-10-CM

## 2022-09-05 DIAGNOSIS — Z5111 Encounter for antineoplastic chemotherapy: Secondary | ICD-10-CM | POA: Diagnosis not present

## 2022-09-05 DIAGNOSIS — Z95828 Presence of other vascular implants and grafts: Secondary | ICD-10-CM

## 2022-09-05 MED ORDER — SODIUM CHLORIDE 0.9 % IV SOLN
75.0000 mg/m2 | Freq: Once | INTRAVENOUS | Status: AC
  Administered 2022-09-05: 140 mg via INTRAVENOUS
  Filled 2022-09-05: qty 14

## 2022-09-05 MED ORDER — SODIUM CHLORIDE 0.9 % IV SOLN
10.0000 mg | Freq: Once | INTRAVENOUS | Status: AC
  Administered 2022-09-05: 10 mg via INTRAVENOUS
  Filled 2022-09-05: qty 1

## 2022-09-05 MED ORDER — SODIUM CHLORIDE 0.9% FLUSH
10.0000 mL | INTRAVENOUS | Status: DC | PRN
  Administered 2022-09-05: 10 mL

## 2022-09-05 MED ORDER — SODIUM CHLORIDE 0.9 % IV SOLN: Freq: Once | INTRAVENOUS | Status: AC

## 2022-09-05 MED ORDER — HEPARIN SOD (PORK) LOCK FLUSH 100 UNIT/ML IV SOLN
500.0000 [IU] | Freq: Once | INTRAVENOUS | Status: AC | PRN
  Administered 2022-09-05: 500 [IU]

## 2022-09-05 NOTE — Patient Instructions (Signed)
[facility]  Discharge Instructions: Thank you for choosing [facility] to provide your oncology and hematology care.  If you have a lab appointment with the Cancer Center - please note that after April 8th, 2024, all labs will be drawn in the cancer center.  You do not have to check in or register with the main entrance as you have in the past but will complete your check-in in the cancer center.  Wear comfortable clothing and clothing appropriate for easy access to any Portacath or PICC line.   We strive to give you quality time with your provider. You may need to reschedule your appointment if you arrive late (15 or more minutes).  Arriving late affects you and other patients whose appointments are after yours.  Also, if you miss three or more appointments without notifying the office, you may be dismissed from the clinic at the provider's discretion.      For prescription refill requests, have your pharmacy contact our office and allow 72 hours for refills to be completed.    Today you received the following chemotherapy and/or immunotherapy agents Vidaza   To help prevent nausea and vomiting after your treatment, we encourage you to take your nausea medication as directed.  BELOW ARE SYMPTOMS THAT SHOULD BE REPORTED IMMEDIATELY: *FEVER GREATER THAN 100.4 F (38 C) OR HIGHER *CHILLS OR SWEATING *NAUSEA AND VOMITING THAT IS NOT CONTROLLED WITH YOUR NAUSEA MEDICATION *UNUSUAL SHORTNESS OF BREATH *UNUSUAL BRUISING OR BLEEDING *URINARY PROBLEMS (pain or burning when urinating, or frequent urination) *BOWEL PROBLEMS (unusual diarrhea, constipation, pain near the anus) TENDERNESS IN MOUTH AND THROAT WITH OR WITHOUT PRESENCE OF ULCERS (sore throat, sores in mouth, or a toothache) UNUSUAL RASH, SWELLING OR PAIN  UNUSUAL VAGINAL DISCHARGE OR ITCHING   Items with * indicate a potential emergency and should be followed up as soon as possible or go to the  Emergency Department if any problems should occur.  Please show the CHEMOTHERAPY ALERT CARD or IMMUNOTHERAPY ALERT CARD at check-in to the Emergency Department and triage nurse.  Should you have questions after your visit or need to cancel or reschedule your appointment, please contact [facility] [phone]  and follow the prompts.  Office hours are 8:00 a.m. to 4:30 p.m. Monday - Friday. Please note that voicemails left after 4:00 p.m. may not be returned until the following business day.  We are closed weekends and major holidays. You have access to a nurse at all times for urgent questions. Please call the main number to the clinic [phone] and follow the prompts.  For any non-urgent questions, you may also contact your provider using MyChart. We now offer e-Visits for anyone 18 and older to request care online for non-urgent symptoms. For details visit [email or web address].   Also download the MyChart app! Go to the app store, search "MyChart", open the app, select [facility], and log in with your MyChart username and password.   

## 2022-09-05 NOTE — Progress Notes (Signed)
Treatment given per orders. Patient tolerated it well without problems. Vitals stable and discharged home from clinic ambulatory. Follow up as scheduled.  

## 2022-09-06 ENCOUNTER — Inpatient Hospital Stay: Payer: Medicare HMO

## 2022-09-06 VITALS — BP 143/82 | HR 58 | Temp 98.6°F | Resp 18

## 2022-09-06 DIAGNOSIS — D469 Myelodysplastic syndrome, unspecified: Secondary | ICD-10-CM

## 2022-09-06 DIAGNOSIS — Z95828 Presence of other vascular implants and grafts: Secondary | ICD-10-CM

## 2022-09-06 DIAGNOSIS — Z5111 Encounter for antineoplastic chemotherapy: Secondary | ICD-10-CM | POA: Diagnosis not present

## 2022-09-06 MED ORDER — SODIUM CHLORIDE 0.9 % IV SOLN
10.0000 mg | Freq: Once | INTRAVENOUS | Status: AC
  Administered 2022-09-06: 10 mg via INTRAVENOUS
  Filled 2022-09-06: qty 10

## 2022-09-06 MED ORDER — HEPARIN SOD (PORK) LOCK FLUSH 100 UNIT/ML IV SOLN
500.0000 [IU] | Freq: Once | INTRAVENOUS | Status: AC | PRN
  Administered 2022-09-06: 500 [IU]

## 2022-09-06 MED ORDER — SODIUM CHLORIDE 0.9 % IV SOLN
75.0000 mg/m2 | Freq: Once | INTRAVENOUS | Status: AC
  Administered 2022-09-06: 140 mg via INTRAVENOUS
  Filled 2022-09-06: qty 14

## 2022-09-06 MED ORDER — SODIUM CHLORIDE 0.9% FLUSH
10.0000 mL | INTRAVENOUS | Status: DC | PRN
  Administered 2022-09-06: 10 mL

## 2022-09-06 MED ORDER — SODIUM CHLORIDE 0.9 % IV SOLN: Freq: Once | INTRAVENOUS | Status: AC

## 2022-09-06 MED ORDER — PALONOSETRON HCL INJECTION 0.25 MG/5ML
0.2500 mg | Freq: Once | INTRAVENOUS | Status: AC
  Administered 2022-09-06: 0.25 mg via INTRAVENOUS
  Filled 2022-09-06: qty 5

## 2022-09-06 NOTE — Patient Instructions (Signed)
[facility]  Discharge Instructions: Thank you for choosing [facility] to provide your oncology and hematology care.  If you have a lab appointment with the Cancer Center - please note that after April 8th, 2024, all labs will be drawn in the cancer center.  You do not have to check in or register with the main entrance as you have in the past but will complete your check-in in the cancer center.  Wear comfortable clothing and clothing appropriate for easy access to any Portacath or PICC line.   We strive to give you quality time with your provider. You may need to reschedule your appointment if you arrive late (15 or more minutes).  Arriving late affects you and other patients whose appointments are after yours.  Also, if you miss three or more appointments without notifying the office, you may be dismissed from the clinic at the provider's discretion.      For prescription refill requests, have your pharmacy contact our office and allow 72 hours for refills to be completed.    Today you received the following chemotherapy and/or immunotherapy agents Vidaza.  Azacitidine Injection What is this medication? AZACITIDINE (ay za SITE i deen) treats blood and bone marrow cancers. It works by slowing down the growth of cancer cells. This medicine may be used for other purposes; ask your health care provider or pharmacist if you have questions. COMMON BRAND NAME(S): Vidaza What should I tell my care team before I take this medication? They need to know if you have any of these conditions: Kidney disease Liver disease Low blood cell levels, such as low white cells, platelets, or red blood cells Low levels of albumin in the blood Low levels of bicarbonate in the blood An unusual or allergic reaction to azacitidine, mannitol, other medications, foods, dyes, or preservatives If you or your partner are pregnant or trying to get pregnant Breast-feeding How should I  use this medication? This medication is injected into a vein or under the skin. It is given by your care team in a hospital or clinic setting. Talk to your care team about the use of this medication in children. While it may be prescribed for children as young as 1 month for selected conditions, precautions do apply. Overdosage: If you think you have taken too much of this medicine contact a poison control center or emergency room at once. NOTE: This medicine is only for you. Do not share this medicine with others. What if I miss a dose? Keep appointments for follow-up doses. It is important not to miss your dose. Call your care team if you are unable to keep an appointment. What may interact with this medication? Interactions are not expected. This list may not describe all possible interactions. Give your health care provider a list of all the medicines, herbs, non-prescription drugs, or dietary supplements you use. Also tell them if you smoke, drink alcohol, or use illegal drugs. Some items may interact with your medicine. What should I watch for while using this medication? Your condition will be monitored carefully while you are receiving this medication. You may need blood work while taking this medication. This medication may make you feel generally unwell. This is not uncommon as chemotherapy can affect healthy cells as well as cancer cells. Report any side effects. Continue your course of treatment even though you feel ill unless your care team tells you to stop. Other product types may be available that contain the   medication azacitidine. The injection and oral products should not be used in place of one another. Talk to your care team if you have questions. This medication can cause serious side effects. To reduce the risk, your care team may give you other medications to take before receiving this one. Be sure to follow the directions from your care team. This medication may increase your  risk of getting an infection. Call your care team for advice if you get a fever, chills, sore throat, or other symptoms of a cold or flu. Do not treat yourself. Try to avoid being around people who are sick. Avoid taking medications that contain aspirin, acetaminophen, ibuprofen, naproxen, or ketoprofen unless instructed by your care team. These medications may hide a fever. This medication may increase your risk to bruise or bleed. Call your care team if you notice any unusual bleeding. Be careful brushing or flossing your teeth or using a toothpick because you may get an infection or bleed more easily. If you have any dental work done, tell your dentist you are receiving this medication. Talk to your care team if you or your partner may be pregnant. Serious birth defects can occur if you take this medication during pregnancy and for 6 months after the last dose. You will need a negative pregnancy test before starting this medication. Contraception is recommended while taking his medication and for 6 months after the last dose. Your care team can help you find the option that works for you. If your partner can get pregnant, use a condom during sex while taking this medication and for 3 months after the last dose. Do not breastfeed while taking this medication and for 1 week after the last dose. This medication may cause infertility. Talk to your care team if you are concerned about your fertility. What side effects may I notice from receiving this medication? Side effects that you should report to your care team as soon as possible: Allergic reactions--skin rash, itching, hives, swelling of the face, lips, tongue, or throat Infection--fever, chills, cough, sore throat, wounds that don't heal, pain or trouble when passing urine, general feeling of discomfort or being unwell Kidney injury--decrease in the amount of urine, swelling of the ankles, hands, or feet Liver injury--right upper belly pain, loss  of appetite, nausea, light-colored stool, dark yellow or brown urine, yellowing skin or eyes, unusual weakness or fatigue Low red blood cell level--unusual weakness or fatigue, dizziness, headache, trouble breathing Tumor lysis syndrome (TLS)--nausea, vomiting, diarrhea, decrease in the amount of urine, dark urine, unusual weakness or fatigue, confusion, muscle pain or cramps, fast or irregular heartbeat, joint pain Unusual bruising or bleeding Side effects that usually do not require medical attention (report to your care team if they continue or are bothersome): Constipation Diarrhea Nausea Pain, redness, or irritation at injection site Vomiting This list may not describe all possible side effects. Call your doctor for medical advice about side effects. You may report side effects to FDA at 1-800-FDA-1088. Where should I keep my medication? This medication is given in a hospital or clinic. It will not be stored at home. NOTE: This sheet is a summary. It may not cover all possible information. If you have questions about this medicine, talk to your doctor, pharmacist, or health care provider.  2024 Elsevier/Gold Standard (2021-07-19 00:00:00)        To help prevent nausea and vomiting after your treatment, we encourage you to take your nausea medication as directed.  BELOW ARE SYMPTOMS   THAT SHOULD BE REPORTED IMMEDIATELY: *FEVER GREATER THAN 100.4 F (38 C) OR HIGHER *CHILLS OR SWEATING *NAUSEA AND VOMITING THAT IS NOT CONTROLLED WITH YOUR NAUSEA MEDICATION *UNUSUAL SHORTNESS OF BREATH *UNUSUAL BRUISING OR BLEEDING *URINARY PROBLEMS (pain or burning when urinating, or frequent urination) *BOWEL PROBLEMS (unusual diarrhea, constipation, pain near the anus) TENDERNESS IN MOUTH AND THROAT WITH OR WITHOUT PRESENCE OF ULCERS (sore throat, sores in mouth, or a toothache) UNUSUAL RASH, SWELLING OR PAIN  UNUSUAL VAGINAL DISCHARGE OR ITCHING   Items with * indicate a potential emergency and  should be followed up as soon as possible or go to the Emergency Department if any problems should occur.  Please show the CHEMOTHERAPY ALERT CARD or IMMUNOTHERAPY ALERT CARD at check-in to the Emergency Department and triage nurse.  Should you have questions after your visit or need to cancel or reschedule your appointment, please contact [facility]  [phone]  and follow the prompts.  Office hours are 8:00 a.m. to 4:30 p.m. Monday - Friday. Please note that voicemails left after 4:00 p.m. may not be returned until the following business day.  We are closed weekends and major holidays. You have access to a nurse at all times for urgent questions. Please call the main number to the clinic [phone] and follow the prompts.  For any non-urgent questions, you may also contact your provider using MyChart. We now offer e-Visits for anyone 18 and older to request care online for non-urgent symptoms. For details visit [email or web address].   Also download the MyChart app! Go to the app store, search "MyChart", open the app, select [employer], and log in with your MyChart username and password.   

## 2022-09-06 NOTE — Progress Notes (Signed)
Patient presents today for day 5 Vidaza chemotherapy infusion.  Patient is in satisfactory condition with no new complaints voiced.  Vital signs are stable.  Labs reviewed and all labs are within treatment parameters.  We will proceed with treatment per MD orders.    Patient tolerated treatment well with no complaints voiced.  Patient left ambulatory in stable condition.  Vital signs stable at discharge.  Follow up as scheduled.

## 2022-09-17 ENCOUNTER — Other Ambulatory Visit: Payer: Self-pay | Admitting: *Deleted

## 2022-09-17 ENCOUNTER — Other Ambulatory Visit: Payer: Self-pay | Admitting: Hematology

## 2022-09-17 ENCOUNTER — Other Ambulatory Visit: Payer: Self-pay

## 2022-09-17 DIAGNOSIS — D469 Myelodysplastic syndrome, unspecified: Secondary | ICD-10-CM

## 2022-09-17 MED ORDER — VENETOCLAX 100 MG PO TABS
200.0000 mg | ORAL_TABLET | Freq: Every day | ORAL | 0 refills | Status: DC
Start: 2022-09-17 — End: 2022-11-05
  Filled 2022-09-17: qty 28, 14d supply, fill #0

## 2022-09-17 NOTE — Telephone Encounter (Signed)
Venclexta refill approved.  Patient is tolerating and is to continue therapy.

## 2022-10-04 ENCOUNTER — Other Ambulatory Visit (HOSPITAL_COMMUNITY): Payer: Self-pay

## 2022-10-09 ENCOUNTER — Other Ambulatory Visit (HOSPITAL_COMMUNITY): Payer: Self-pay

## 2022-10-13 ENCOUNTER — Other Ambulatory Visit: Payer: Self-pay

## 2022-10-13 NOTE — Progress Notes (Signed)
[facility] [address] [place] [address]    Clinic Day:  10/13/2022  Referring physician: Aliene Beams, MD  Patient Care Team: Aliene Beams, MD as PCP - General (Family Medicine) Aliene Beams, MD (Family Medicine) [place] Bali, MD (Inactive) as Consulting Physician (Gastroenterology) Doreatha Massed, MD as Medical Oncologist (Hematology)   ASSESSMENT & PLAN:   Assessment: 1. MDS with EB 2: - Seen at the request of Dr. Tracie Harrier for cytopenias - BMBX on 06/04/2021: Hypercellular marrow with dyspoietic changes involving the granulocytic cell line and megakaryocytes.  Myeloblasts 8% on aspirate smears and 10% by flow.  Cytogenetics 78, XY (20).  MDS FISH panel normal. 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 - IPSS-M score of 0.12, moderate high risk.  Leukemia free survival is 2.3 years.  Overall survival 2.8 years. - Cycle 1 of azacitidine on 07/30/2021.  BMBX (01/04/2022): Overall improvement in blast count and cellularity.  Chromosome analysis and FISH panel normal.  Venetoclax 200 mg day 1 through 14 added with cycle 8 on 02/18/2022. - BMBX (06/05/2022): Slightly hypercellular for age with very mild dyspoietic changes at best associated with increased number of eosinophilic cells.  No increase in blast cells identified. - Azacitidine 5 days and venetoclax 200 mg days 1-14 every 6 weeks on 07/22/2022   2. Social/family history: - He lives at home with his wife.  He swims about 40 minutes daily. 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  He also worked as a Geologist, engineering.  Non-smoker. - Paternal aunt had breast cancer and mother had pancreatic cancer.  3.  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 - He underwent thrombolytic therapy at [facility]. - He will followed up with vascular at [facility] and has been on Xarelto since then.    Plan: 1. MDS with EB 2: - He has tolerated  azacitidine and venetoclax very well.  No infections noted. - He had a dental crown placed, which was uneventful. - Reviewed labs today: CBC grossly normal with mild anemia stable.  LFTs with slightly elevated total bilirubin and normal creatinine. - Proceed with next cycle of azacitidine 75 mg/m x 5 days and he will start venetoclax today at 200 mg daily for days 1-14. - RTC 6 weeks for follow-up.   2. Unprovoked left subclavian DVT: - Xarelto discontinued due to hemorrhoidal bleeding.  No evidence of recurrence.   3. Constipation: - Continue Colace and MiraLAX.  He is not requiring lactulose.    No orders of the defined types were placed in this encounter.    Alben Deeds Teague,acting as a Neurosurgeon for Doreatha Massed, MD.,have documented all relevant documentation on the behalf of Doreatha Massed, MD,as directed by  Doreatha Massed, MD while in the presence of Doreatha Massed, MD.  ***   [place] R Teague   7/28/202411:16 PM  CHIEF COMPLAINT:   Diagnosis: MDS with EB 2    Cancer Staging  No matching staging information was found for the patient.    Prior Therapy: none  Current Therapy:  Azacitidine IV D1-7 q28d     HISTORY OF PRESENT ILLNESS:   Oncology History  Myelodysplastic syndrome (HCC)  06/17/2021 Initial Diagnosis   Myelodysplastic syndrome (HCC)   07/30/2021 - 10/30/2021 Chemotherapy   Patient is on Treatment Plan : MYELODYSPLASIA  Azacitidine IV D1-7 q28d     07/30/2021 -  Chemotherapy   Patient is on  Treatment Plan : MYELODYSPLASIA  Azacitidine IV D1-7 q28d        INTERVAL HISTORY:   Chad Lamb is a 77 y.o. male presenting to clinic today for follow up of MDS with EB 2. He was last seen by me on 09/02/22.  Today, he states that he is doing well overall. His appetite level is at ***%. His energy level is at ***%.  PAST MEDICAL HISTORY:   Past Medical History: Past Medical History:  Diagnosis Date   Allergy    Anxiety    Arthritis     Asthma    Cataract    Clotting disorder (HCC)    DVT (deep venous thrombosis) (HCC)    unknown etiology. [facility]   GERD (gastroesophageal reflux disease)    History of dental surgery    feb 2023   Hyperlipidemia    Hypertension    MDS (myelodysplastic syndrome), high grade (HCC) 06/17/2021   Pneumonia    Port-A-Cath in place 07/30/2021    Surgical History: Past Surgical History:  Procedure Laterality Date   CATARACT EXTRACTION W/PHACO Right 11/24/2017   Procedure: CATARACT EXTRACTION PHACO AND INTRAOCULAR LENS PLACEMENT RIGHT EYE;  Surgeon: Gemma Payor, MD;  Location: [facility];  Service: Ophthalmology;  Laterality: Right;  CDE: 8.61   CATARACT EXTRACTION W/PHACO Left 12/29/2017   Procedure: CATARACT EXTRACTION PHACO AND INTRAOCULAR LENS PLACEMENT (IOC);  Surgeon: Gemma Payor, MD;  Location: [facility];  Service: Ophthalmology;  Laterality: Left;  CDE: 7.21   GANGLION CYST EXCISION     HERNIA REPAIR     bilateral   IR BONE MARROW BIOPSY & ASPIRATION  06/05/2022   PARS PLANA VITRECTOMY Left 05/17/2021   Procedure: PARS PLANA VITRECTOMY 25 GAUGE WITH ANTIBIOTIC INJECTION  AND ENDOLASER LEFT EYE;  Surgeon: Carmela Rima, MD;  Location: [facility] OR;  Service: Ophthalmology;  Laterality: Left;   PORTACATH PLACEMENT Right 07/09/2021   Procedure: INSERTION PORT-A-CATH;  Surgeon: Franky Macho, MD;  Location: [facility];  Service: General;  Laterality: Right;   TONSILLECTOMY     VASCULAR SURGERY      Social History: Social History   Socioeconomic History   Marital status: Married    Spouse name: Harriett Sine   Number of children: 0   Years of education: 13   Highest education level: High school graduate  Occupational History   Not on file  Tobacco Use   Smoking status: Former    Types: Pipe    Quit date: 03/21/1966    Years since quitting: 56.6   Smokeless tobacco: Never  Vaping Use   Vaping status: Never Used  Substance and Sexual Activity   Alcohol use: Yes    Alcohol/week: 1.0 standard drink of  alcohol    Types: 1 Cans of beer per week    Comment: 1 can of beer a week   Drug use: Never   Sexual activity: Not Currently  Other Topics Concern   Not on file  Social History Narrative   ** Merged History Encounter **       Grew up in [place], [place] and [place].  Retired.  Worked in [place]. 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    Social Determinants of Health   Financial Resource Strain: Low Risk  (01/23/2017)   Overall Financial Resource Strain (CARDIA)    Difficulty of Paying Living Expenses: Not hard at all  Food Insecurity: No Food Insecurity (01/23/2017)   Hunger Vital Sign    Worried  About Running Out of Food in the Last Year: Never true    Ran Out of Food in the Last Year: Never true  Transportation Needs: No Transportation Needs (01/23/2017)   PRAPARE - Administrator, Civil Service (Medical): No    Lack of Transportation (Non-Medical): No  Physical Activity: Unknown (01/23/2017)   Exercise Vital Sign    Days of Exercise per Week: 3 days    Minutes of Exercise per Session: Not on file  Stress: Stress Concern Present (01/23/2017)   [employer] of Occupational Health - Occupational Stress Questionnaire    Feeling of Stress : To some extent  Social Connections: Somewhat Isolated (01/23/2017)   Social Connection and Isolation Panel [NHANES]    Frequency of Communication with Friends and Family: More than three times a week    Frequency of Social Gatherings with Friends and Family: More than three times a week    Attends Religious Services: Never    Database administrator or Organizations: No    Attends Banker Meetings: Never    Marital Status: Married  Catering manager Violence: Not At Risk (01/23/2017)   Humiliation, Afraid, Rape, and Kick questionnaire    Fear of Current or Ex-Partner: No    Emotionally Abused: No    Physically Abused: No    Sexually Abused: No    Family  History: Family History  Problem Relation Age of Onset   Cancer Mother        pancreatic   Arthritis Father    Early death Father 62       Lung infiltrate   Colon cancer Neg Hx    Colon polyps Neg Hx     Current Medications:  Current Outpatient Medications:    albuterol (PROVENTIL HFA;VENTOLIN HFA) 108 (90 Base) MCG/ACT inhaler, Inhale 1-2 puffs into the lungs every 6 (six) hours as needed for wheezing or shortness of breath., Disp: , Rfl:    ALPRAZolam (XANAX) 0.5 MG tablet, Take 0.5 mg by mouth 2 (two) times daily as needed for anxiety., Disp: , Rfl:    amLODipine (NORVASC) 2.5 MG tablet, Take 2.5 mg by mouth daily., Disp: , Rfl:    azaCITIDine 5 mg/2 mLs in lactated ringers infusion, Inject into the vein daily. Days 1-7 every 28 days, Disp: , Rfl:    DENTA 5000 PLUS 1.1 % CREA dental cream, Take by mouth., Disp: , Rfl:    fluticasone furoate-vilanterol (BREO ELLIPTA) 200-25 MCG/ACT AEPB, Inhale 1 puff into the lungs daily., Disp: , Rfl:    Lactulose 20 GM/30ML SOLN, Take 30 mLs (20 g total) by mouth at bedtime., Disp: 473 mL, Rfl: 3   Lidocaine-Hydrocort, Perianal, 3-0.5 % CREA, Apply topically 2 (two) times daily as needed., Disp: , Rfl:    Lidocaine-Hydrocortisone Ace 3-0.5 % CREA, Apply 1 application topically as directed. (Patient taking differently: Apply 1 application  topically 2 (two) times daily as needed (hemorrhoids).), Disp: 30 Tube, Rfl: 11   lidocaine-prilocaine (EMLA) cream, Apply a SMALL AMOUNT TO port a cath site (DO not RUB in) AND cover with PLASTIC WRAP ONE hour prior TO infusion appointment, Disp: 30 g, Rfl: 3   loratadine (CLARITIN) 10 MG tablet, Take 10 mg by mouth daily., Disp: , Rfl:    Lutein 40 MG CAPS, Take 40 mg by mouth daily., Disp: , Rfl:    Multiple Vitamins-Minerals (CENTRUM ADULTS PO), Take 1 tablet by mouth daily., Disp: , Rfl:    nystatin cream (MYCOSTATIN), Apply 1  application. topically 2 (two) times daily as needed for dry skin., Disp: , Rfl:     ofloxacin (OCUFLOX) 0.3 % ophthalmic solution, Place 1 drop into the left eye See admin instructions. 1 drop to left eye 4 x daily for 7 days after monthly procedure., Disp: , Rfl:    omeprazole (PRILOSEC OTC) 20 MG tablet, Take 5-10 mg by mouth daily., Disp: , Rfl:    sildenafil (VIAGRA) 50 MG tablet, Take 50 mg by mouth daily as needed for erectile dysfunction., Disp: , Rfl:    traMADol (ULTRAM) 50 MG tablet, Take 1 tablet (50 mg total) by mouth every 6 (six) hours as needed., Disp: 15 tablet, Rfl: 0   venetoclax (VENCLEXTA) 100 MG tablet, Take 2 tablets (200 mg total) by mouth daily. Take for 14 days, then hold for 14 days. Repeat every 28 days. Take with a meal and a full glass of water., Disp: 28 tablet, Rfl: 0 No current facility-administered medications for this visit.  Facility-Administered Medications Ordered in Other Visits:    sodium chloride flush (NS) 0.9 % injection 10 mL, 10 mL, Intracatheter, PRN, Doreatha Massed, MD, 10 mL at 06/12/22 1606   Allergies: Allergies  Allergen Reactions   Cephalosporins Itching   Amoxapine And Related Itching and Rash    Has patient had a PCN reaction causing immediate rash, facial/tongue/throat swelling, SOB or lightheadedness with hypotension: No Has patient had a PCN reaction causing severe rash involving mucus membranes or skin necrosis: No Has patient had a PCN reaction that required hospitalization: No Has patient had a PCN reaction occurring within the last 10 years: No If all of the above answers are "NO", then may proceed with Cephalosporin use.     Amoxicillin Rash   Cephalexin Rash   Clindamycin/Lincomycin Itching and Rash   Sulfamethoxazole Itching and Rash   Trimethoprim Rash    REVIEW OF SYSTEMS:   Review of Systems  Constitutional:  Negative for chills, fatigue and fever.  HENT:   Negative for lump/mass, mouth sores, nosebleeds, sore throat and trouble swallowing.   Eyes:  Negative for eye problems.  Respiratory:   Negative for cough and shortness of breath.   Cardiovascular:  Negative for chest pain, leg swelling and palpitations.  Gastrointestinal:  Negative for abdominal pain, constipation, diarrhea, nausea and vomiting.  Genitourinary:  Negative for bladder incontinence, difficulty urinating, dysuria, frequency, hematuria and nocturia.   Musculoskeletal:  Negative for arthralgias, back pain, flank pain, myalgias and neck pain.  Skin:  Negative for itching and rash.  Neurological:  Negative for dizziness, headaches and numbness.  Hematological:  Does not bruise/bleed easily.  Psychiatric/Behavioral:  Negative for depression, sleep disturbance and suicidal ideas. The patient is not nervous/anxious.   All other systems reviewed and are negative.    VITALS:   There were no vitals taken for this visit.  Wt Readings from Last 3 Encounters:  09/02/22 155 lb 12.8 oz (70.7 kg)  07/24/22 158 lb 14.4 oz (72.1 kg)  07/22/22 157 lb 9.6 oz (71.5 kg)    There is no height or weight on file to calculate BMI.  Performance status (ECOG): 1 - Symptomatic but completely ambulatory  PHYSICAL EXAM:   Physical Exam Vitals and nursing note reviewed. Exam conducted with a chaperone present.  Constitutional:      Appearance: Normal appearance.  Cardiovascular:     Rate and Rhythm: Normal rate and regular rhythm.     Pulses: Normal pulses.     Heart sounds: Normal heart  sounds.  Pulmonary:     Effort: Pulmonary effort is normal.     Breath sounds: Normal breath sounds.  Abdominal:     Palpations: Abdomen is soft. There is no hepatomegaly, splenomegaly or mass.     Tenderness: There is no abdominal tenderness.  Musculoskeletal:     Right lower leg: No edema.     Left lower leg: No edema.  Lymphadenopathy:     Cervical: No cervical adenopathy.     Right cervical: No superficial, deep or posterior cervical adenopathy.    Left cervical: No superficial, deep or posterior cervical adenopathy.     Upper Body:      Right upper body: No supraclavicular or axillary adenopathy.     Left upper body: No supraclavicular or axillary adenopathy.  Neurological:     General: No focal deficit present.     Mental Status: He is alert and oriented to person, place, and time.  Psychiatric:        Mood and Affect: Mood normal.        Behavior: Behavior normal.     LABS:      Latest Ref Rng & Units 09/02/2022   11:41 AM 07/22/2022    8:27 AM 06/10/2022   12:14 PM  CBC  WBC 4.0 - 10.5 K/uL 5.3  5.5  4.5   Hemoglobin 13.0 - 17.0 g/dL 78.2  95.6  21.3   Hematocrit 39.0 - 52.0 % 35.0  34.4  35.0   Platelets 150 - 400 K/uL 207  211  228       Latest Ref Rng & Units 09/02/2022   11:41 AM 07/22/2022    8:27 AM 06/10/2022   12:14 PM  CMP  Glucose 70 - 99 mg/dL 086  578  469   BUN 8 - 23 mg/dL 12  15  15    Creatinine 0.61 - 1.24 mg/dL 6.29  5.28  4.13   Sodium 135 - 145 mmol/L 139  138  138   Potassium 3.5 - 5.1 mmol/L 3.6  3.6  3.6   Chloride 98 - 111 mmol/L 106  106  105   CO2 22 - 32 mmol/L 24  25  25    Calcium 8.9 - 10.3 mg/dL 9.3  9.0  9.3   Total Protein 6.5 - 8.1 g/dL 5.8  6.0  5.9   Total Bilirubin 0.3 - 1.2 mg/dL 1.3  1.3  0.9   Alkaline Phos 38 - 126 U/L 56  61  66   AST 15 - 41 U/L 22  22  21    ALT 0 - 44 U/L 16  16  15       No results found for: "CEA1", "CEA" / No results found for: "CEA1", "CEA" No results found for: "PSA1" No results found for: "CAN199" No results found for: "CAN125"  Lab Results  Component Value Date   TOTALPROTELP 6.3 01/05/2021   ALBUMINELP 3.3 01/05/2021   A1GS 0.3 01/05/2021   A2GS 0.8 01/05/2021   BETS 1.0 01/05/2021   GAMS 1.0 01/05/2021   MSPIKE Not Observed 01/05/2021   SPEI Comment 01/05/2021   No results found for: "TIBC", "FERRITIN", "IRONPCTSAT" Lab Results  Component Value Date   LDH 147 11/20/2021   LDH 143 10/22/2021   LDH 144 10/16/2021     STUDIES:   No results found.

## 2022-10-14 ENCOUNTER — Inpatient Hospital Stay (HOSPITAL_BASED_OUTPATIENT_CLINIC_OR_DEPARTMENT_OTHER): Payer: Medicare HMO | Admitting: Hematology

## 2022-10-14 ENCOUNTER — Inpatient Hospital Stay: Payer: Medicare HMO | Attending: Hematology

## 2022-10-14 ENCOUNTER — Inpatient Hospital Stay: Payer: Medicare HMO

## 2022-10-14 VITALS — BP 134/84 | HR 59 | Temp 97.5°F | Resp 16

## 2022-10-14 DIAGNOSIS — Z95828 Presence of other vascular implants and grafts: Secondary | ICD-10-CM | POA: Diagnosis not present

## 2022-10-14 DIAGNOSIS — Z5111 Encounter for antineoplastic chemotherapy: Secondary | ICD-10-CM | POA: Insufficient documentation

## 2022-10-14 DIAGNOSIS — D469 Myelodysplastic syndrome, unspecified: Secondary | ICD-10-CM

## 2022-10-14 DIAGNOSIS — Z87891 Personal history of nicotine dependence: Secondary | ICD-10-CM | POA: Insufficient documentation

## 2022-10-14 DIAGNOSIS — D4622 Refractory anemia with excess of blasts 2: Secondary | ICD-10-CM | POA: Diagnosis not present

## 2022-10-14 LAB — CBC WITH DIFFERENTIAL/PLATELET
Abs Immature Granulocytes: 0.02 10*3/uL (ref 0.00–0.07)
Basophils Absolute: 0 10*3/uL (ref 0.0–0.1)
Basophils Relative: 1 %
Eosinophils Absolute: 0.5 10*3/uL (ref 0.0–0.5)
Eosinophils Relative: 9 %
HCT: 36.7 % — ABNORMAL LOW (ref 39.0–52.0)
Hemoglobin: 11.7 g/dL — ABNORMAL LOW (ref 13.0–17.0)
Immature Granulocytes: 0 %
Lymphocytes Relative: 21 %
Lymphs Abs: 1.2 10*3/uL (ref 0.7–4.0)
MCH: 25.8 pg — ABNORMAL LOW (ref 26.0–34.0)
MCHC: 31.9 g/dL (ref 30.0–36.0)
MCV: 81 fL (ref 80.0–100.0)
Monocytes Absolute: 0.7 10*3/uL (ref 0.1–1.0)
Monocytes Relative: 12 %
Neutro Abs: 3.3 10*3/uL (ref 1.7–7.7)
Neutrophils Relative %: 57 %
Platelets: 209 10*3/uL (ref 150–400)
RBC: 4.53 MIL/uL (ref 4.22–5.81)
RDW: 18.2 % — ABNORMAL HIGH (ref 11.5–15.5)
WBC: 5.8 10*3/uL (ref 4.0–10.5)
nRBC: 0 % (ref 0.0–0.2)

## 2022-10-14 LAB — COMPREHENSIVE METABOLIC PANEL
ALT: 17 U/L (ref 0–44)
AST: 19 U/L (ref 15–41)
Albumin: 3.8 g/dL (ref 3.5–5.0)
Alkaline Phosphatase: 64 U/L (ref 38–126)
Anion gap: 5 (ref 5–15)
BUN: 14 mg/dL (ref 8–23)
CO2: 26 mmol/L (ref 22–32)
Calcium: 9.3 mg/dL (ref 8.9–10.3)
Chloride: 108 mmol/L (ref 98–111)
Creatinine, Ser: 0.9 mg/dL (ref 0.61–1.24)
GFR, Estimated: >60 mL/min (ref >60)
Glucose, Bld: 125 mg/dL — ABNORMAL HIGH (ref 70–99)
Potassium: 3.6 mmol/L (ref 3.5–5.1)
Sodium: 139 mmol/L (ref 135–145)
Total Bilirubin: 1.5 mg/dL — ABNORMAL HIGH (ref 0.3–1.2)
Total Protein: 6.1 g/dL — ABNORMAL LOW (ref 6.5–8.1)

## 2022-10-14 LAB — MAGNESIUM: Magnesium: 2.2 mg/dL (ref 1.7–2.4)

## 2022-10-14 MED ORDER — SODIUM CHLORIDE 0.9 % IV SOLN
10.0000 mg | Freq: Once | INTRAVENOUS | Status: AC
  Administered 2022-10-14: 10 mg via INTRAVENOUS
  Filled 2022-10-14: qty 10

## 2022-10-14 MED ORDER — PALONOSETRON HCL INJECTION 0.25 MG/5ML
0.2500 mg | Freq: Once | INTRAVENOUS | Status: AC
  Administered 2022-10-14: 0.25 mg via INTRAVENOUS
  Filled 2022-10-14: qty 5

## 2022-10-14 MED ORDER — HEPARIN SOD (PORK) LOCK FLUSH 100 UNIT/ML IV SOLN
500.0000 [IU] | Freq: Once | INTRAVENOUS | Status: AC | PRN
  Administered 2022-10-14: 500 [IU]

## 2022-10-14 MED ORDER — SODIUM CHLORIDE 0.9 % IV SOLN
75.0000 mg/m2 | Freq: Once | INTRAVENOUS | Status: AC
  Administered 2022-10-14: 140 mg via INTRAVENOUS
  Filled 2022-10-14: qty 14

## 2022-10-14 MED ORDER — SODIUM CHLORIDE 0.9 % IV SOLN: Freq: Once | INTRAVENOUS | Status: AC

## 2022-10-14 MED ORDER — SODIUM CHLORIDE 0.9% FLUSH
10.0000 mL | INTRAVENOUS | Status: DC | PRN
  Administered 2022-10-14: 10 mL

## 2022-10-14 NOTE — Progress Notes (Signed)
Labs reviewed with MD , ok to treat.  Treatment given per orders. Patient tolerated it well without problems. Vitals stable and discharged home from clinic ambulatory. Follow up as scheduled.

## 2022-10-14 NOTE — Patient Instructions (Signed)

## 2022-10-14 NOTE — Patient Instructions (Addendum)
[facility]  Discharge Instructions: Thank you for choosing [facility] to provide your oncology and hematology care.  If you have a lab appointment with the Cancer Center - please note that after April 8th, 2024, all labs will be drawn in the cancer center.  You do not have to check in or register with the main entrance as you have in the past but will complete your check-in in the cancer center.  Wear comfortable clothing and clothing appropriate for easy access to any Portacath or PICC line.   We strive to give you quality time with your provider. You may need to reschedule your appointment if you arrive late (15 or more minutes).  Arriving late affects you and other patients whose appointments are after yours.  Also, if you miss three or more appointments without notifying the office, you may be dismissed from the clinic at the provider's discretion.      For prescription refill requests, have your pharmacy contact our office and allow 72 hours for refills to be completed.    Today you received the following chemotherapy and/or immunotherapy agents Azacitadine   To help prevent nausea and vomiting after your treatment, we encourage you to take your nausea medication as directed.  BELOW ARE SYMPTOMS THAT SHOULD BE REPORTED IMMEDIATELY: *FEVER GREATER THAN 100.4 F (38 C) OR HIGHER *CHILLS OR SWEATING *NAUSEA AND VOMITING THAT IS NOT CONTROLLED WITH YOUR NAUSEA MEDICATION *UNUSUAL SHORTNESS OF BREATH *UNUSUAL BRUISING OR BLEEDING *URINARY PROBLEMS (pain or burning when urinating, or frequent urination) *BOWEL PROBLEMS (unusual diarrhea, constipation, pain near the anus) TENDERNESS IN MOUTH AND THROAT WITH OR WITHOUT PRESENCE OF ULCERS (sore throat, sores in mouth, or a toothache) UNUSUAL RASH, SWELLING OR PAIN  UNUSUAL VAGINAL DISCHARGE OR ITCHING   Items with * indicate a potential emergency and should be followed up as soon as possible or go to the  Emergency Department if any problems should occur.  Please show the CHEMOTHERAPY ALERT CARD or IMMUNOTHERAPY ALERT CARD at check-in to the Emergency Department and triage nurse.  Should you have questions after your visit or need to cancel or reschedule your appointment, please contact [facility] [phone]  and follow the prompts.  Office hours are 8:00 a.m. to 4:30 p.m. Monday - Friday. Please note that voicemails left after 4:00 p.m. may not be returned until the following business day.  We are closed weekends and major holidays. You have access to a nurse at all times for urgent questions. Please call the main number to the clinic [phone] and follow the prompts.  For any non-urgent questions, you may also contact your provider using MyChart. We now offer e-Visits for anyone 59 and older to request care online for non-urgent symptoms. For details visit [email or web address].   Also download the MyChart app! Go to the app store, search "MyChart", open the app, select [place], and log in with your MyChart username and password.

## 2022-10-14 NOTE — Progress Notes (Signed)
Patient is taking Venetoclax as prescribed.  He has not missed any doses and reports no side effects at this time.    Patient has been examined by Dr. Ellin Saba. Vital signs and labs have been reviewed by MD - ANC, Creatinine, LFTs, hemoglobin, and platelets are within treatment parameters per M.D. - pt may proceed with treatment.  Primary RN and pharmacy notified.

## 2022-10-15 ENCOUNTER — Inpatient Hospital Stay: Payer: Medicare HMO

## 2022-10-15 VITALS — BP 123/78 | HR 61 | Temp 97.8°F | Resp 18

## 2022-10-15 DIAGNOSIS — Z95828 Presence of other vascular implants and grafts: Secondary | ICD-10-CM

## 2022-10-15 DIAGNOSIS — D469 Myelodysplastic syndrome, unspecified: Secondary | ICD-10-CM

## 2022-10-15 DIAGNOSIS — Z5111 Encounter for antineoplastic chemotherapy: Secondary | ICD-10-CM | POA: Diagnosis not present

## 2022-10-15 MED ORDER — SODIUM CHLORIDE 0.9% FLUSH
10.0000 mL | INTRAVENOUS | Status: DC | PRN
  Administered 2022-10-15: 10 mL

## 2022-10-15 MED ORDER — SODIUM CHLORIDE 0.9 % IV SOLN
75.0000 mg/m2 | Freq: Once | INTRAVENOUS | Status: AC
  Administered 2022-10-15: 140 mg via INTRAVENOUS
  Filled 2022-10-15: qty 14

## 2022-10-15 MED ORDER — SODIUM CHLORIDE 0.9 % IV SOLN: Freq: Once | INTRAVENOUS | Status: AC

## 2022-10-15 MED ORDER — SODIUM CHLORIDE 0.9 % IV SOLN
10.0000 mg | Freq: Once | INTRAVENOUS | Status: AC
  Administered 2022-10-15: 10 mg via INTRAVENOUS
  Filled 2022-10-15: qty 10

## 2022-10-15 MED ORDER — HEPARIN SOD (PORK) LOCK FLUSH 100 UNIT/ML IV SOLN
500.0000 [IU] | Freq: Once | INTRAVENOUS | Status: AC | PRN
  Administered 2022-10-15: 500 [IU]

## 2022-10-15 NOTE — Patient Instructions (Signed)
[facility]  Discharge Instructions: Thank you for choosing [facility] to provide your oncology and hematology care.  If you have a lab appointment with the Cancer Center - please note that after April 8th, 2024, all labs will be drawn in the cancer center.  You do not have to check in or register with the main entrance as you have in the past but will complete your check-in in the cancer center.  Wear comfortable clothing and clothing appropriate for easy access to any Portacath or PICC line.   We strive to give you quality time with your provider. You may need to reschedule your appointment if you arrive late (15 or more minutes).  Arriving late affects you and other patients whose appointments are after yours.  Also, if you miss three or more appointments without notifying the office, you may be dismissed from the clinic at the provider's discretion.      For prescription refill requests, have your pharmacy contact our office and allow 72 hours for refills to be completed.    Today you received the following chemotherapy and/or immunotherapy agents Vidaza   To help prevent nausea and vomiting after your treatment, we encourage you to take your nausea medication as directed.  BELOW ARE SYMPTOMS THAT SHOULD BE REPORTED IMMEDIATELY: *FEVER GREATER THAN 100.4 F (38 C) OR HIGHER *CHILLS OR SWEATING *NAUSEA AND VOMITING THAT IS NOT CONTROLLED WITH YOUR NAUSEA MEDICATION *UNUSUAL SHORTNESS OF BREATH *UNUSUAL BRUISING OR BLEEDING *URINARY PROBLEMS (pain or burning when urinating, or frequent urination) *BOWEL PROBLEMS (unusual diarrhea, constipation, pain near the anus) TENDERNESS IN MOUTH AND THROAT WITH OR WITHOUT PRESENCE OF ULCERS (sore throat, sores in mouth, or a toothache) UNUSUAL RASH, SWELLING OR PAIN  UNUSUAL VAGINAL DISCHARGE OR ITCHING   Items with * indicate a potential emergency and should be followed up as soon as possible or go to the  Emergency Department if any problems should occur.  Please show the CHEMOTHERAPY ALERT CARD or IMMUNOTHERAPY ALERT CARD at check-in to the Emergency Department and triage nurse.  Should you have questions after your visit or need to cancel or reschedule your appointment, please contact [facility] [phone]  and follow the prompts.  Office hours are 8:00 a.m. to 4:30 p.m. Monday - Friday. Please note that voicemails left after 4:00 p.m. may not be returned until the following business day.  We are closed weekends and major holidays. You have access to a nurse at all times for urgent questions. Please call the main number to the clinic [phone] and follow the prompts.  For any non-urgent questions, you may also contact your provider using MyChart. We now offer e-Visits for anyone 18 and older to request care online for non-urgent symptoms. For details visit [email or web address].   Also download the MyChart app! Go to the app store, search "MyChart", open the app, select [facility], and log in with your MyChart username and password.   

## 2022-10-15 NOTE — Progress Notes (Signed)
Patient presents today for Vidaza per providers order.  Patient has no new complaints at this time.  Treatment given today per MD orders.  Stable during infusion without adverse affects.  Vital signs stable.  No complaints at this time.  Discharge from clinic ambulatory in stable condition.  Alert and oriented X 3.  Follow up with [facility] as scheduled.

## 2022-10-16 ENCOUNTER — Inpatient Hospital Stay: Payer: Medicare HMO

## 2022-10-16 VITALS — BP 129/83 | HR 57 | Temp 96.6°F | Resp 18

## 2022-10-16 DIAGNOSIS — Z5111 Encounter for antineoplastic chemotherapy: Secondary | ICD-10-CM | POA: Diagnosis not present

## 2022-10-16 DIAGNOSIS — Z95828 Presence of other vascular implants and grafts: Secondary | ICD-10-CM

## 2022-10-16 DIAGNOSIS — D469 Myelodysplastic syndrome, unspecified: Secondary | ICD-10-CM

## 2022-10-16 MED ORDER — SODIUM CHLORIDE 0.9 % IV SOLN: Freq: Once | INTRAVENOUS | Status: AC

## 2022-10-16 MED ORDER — SODIUM CHLORIDE 0.9 % IV SOLN
10.0000 mg | Freq: Once | INTRAVENOUS | Status: AC
  Administered 2022-10-16: 10 mg via INTRAVENOUS
  Filled 2022-10-16: qty 1

## 2022-10-16 MED ORDER — SODIUM CHLORIDE 0.9% FLUSH
10.0000 mL | INTRAVENOUS | Status: DC | PRN
  Administered 2022-10-16: 10 mL

## 2022-10-16 MED ORDER — SODIUM CHLORIDE 0.9 % IV SOLN
75.0000 mg/m2 | Freq: Once | INTRAVENOUS | Status: AC
  Administered 2022-10-16: 140 mg via INTRAVENOUS
  Filled 2022-10-16: qty 14

## 2022-10-16 MED ORDER — HEPARIN SOD (PORK) LOCK FLUSH 100 UNIT/ML IV SOLN
500.0000 [IU] | Freq: Once | INTRAVENOUS | Status: AC | PRN
  Administered 2022-10-16: 500 [IU]

## 2022-10-16 MED ORDER — PALONOSETRON HCL INJECTION 0.25 MG/5ML
0.2500 mg | Freq: Once | INTRAVENOUS | Status: AC
  Administered 2022-10-16: 0.25 mg via INTRAVENOUS
  Filled 2022-10-16: qty 5

## 2022-10-16 NOTE — Patient Instructions (Signed)
[facility] [place]  Discharge Instructions: Thank you for choosing [facility] to provide your oncology and hematology care.  If you have a lab appointment with the Cancer Center - please note that after April 8th, 2024, all labs will be drawn in the cancer center.  You do not have to check in or register with the main entrance as you have in the past but will complete your check-in in the cancer center.  Wear comfortable clothing and clothing appropriate for easy access to any Portacath or PICC line.   We strive to give you quality time with your provider. You may need to reschedule your appointment if you arrive late (15 or more minutes).  Arriving late affects you and other patients whose appointments are after yours.  Also, if you miss three or more appointments without notifying the office, you may be dismissed from the clinic at the provider's discretion.      For prescription refill requests, have your pharmacy contact our office and allow 72 hours for refills to be completed.    Today you received the following chemotherapy and/or immunotherapy agents Vidaza.  Azacitidine Injection What is this medication? AZACITIDINE (ay za SITE i deen) treats blood and bone marrow cancers. It works by slowing down the growth of cancer cells. This medicine may be used for other purposes; ask your health care provider or pharmacist if you have questions. COMMON BRAND NAME(S): Vidaza What should I tell my care team before I take this medication? They need to know if you have any of these conditions: Kidney disease Liver disease Low blood cell levels, such as low white cells, platelets, or red blood cells Low levels of albumin in the blood Low levels of bicarbonate in the blood An unusual or allergic reaction to azacitidine, mannitol, other medications, foods, dyes, or preservatives If you or your partner are pregnant or trying to get pregnant Breast-feeding How should I  use this medication? This medication is injected into a vein or under the skin. It is given by your care team in a hospital or clinic setting. Talk to your care team about the use of this medication in children. While it may be prescribed for children as young as 1 month for selected conditions, precautions do apply. Overdosage: If you think you have taken too much of this medicine contact a poison control center or emergency room at once. NOTE: This medicine is only for you. Do not share this medicine with others. What if I miss a dose? Keep appointments for follow-up doses. It is important not to miss your dose. Call your care team if you are unable to keep an appointment. What may interact with this medication? Interactions are not expected. This list may not describe all possible interactions. Give your health care provider a list of all the medicines, herbs, non-prescription drugs, or dietary supplements you use. Also tell them if you smoke, drink alcohol, or use illegal drugs. Some items may interact with your medicine. What should I watch for while using this medication? Your condition will be monitored carefully while you are receiving this medication. You may need blood work while taking this medication. This medication may make you feel generally unwell. This is not uncommon as chemotherapy can affect healthy cells as well as cancer cells. Report any side effects. Continue your course of treatment even though you feel ill unless your care team tells you to stop. Other product types may be available that contain the   medication azacitidine. The injection and oral products should not be used in place of one another. Talk to your care team if you have questions. This medication can cause serious side effects. To reduce the risk, your care team may give you other medications to take before receiving this one. Be sure to follow the directions from your care team. This medication may increase your  risk of getting an infection. Call your care team for advice if you get a fever, chills, sore throat, or other symptoms of a cold or flu. Do not treat yourself. Try to avoid being around people who are sick. Avoid taking medications that contain aspirin, acetaminophen, ibuprofen, naproxen, or ketoprofen unless instructed by your care team. These medications may hide a fever. This medication may increase your risk to bruise or bleed. Call your care team if you notice any unusual bleeding. Be careful brushing or flossing your teeth or using a toothpick because you may get an infection or bleed more easily. If you have any dental work done, tell your dentist you are receiving this medication. Talk to your care team if you or your partner may be pregnant. Serious birth defects can occur if you take this medication during pregnancy and for 6 months after the last dose. You will need a negative pregnancy test before starting this medication. Contraception is recommended while taking his medication and for 6 months after the last dose. Your care team can help you find the option that works for you. If your partner can get pregnant, use a condom during sex while taking this medication and for 3 months after the last dose. Do not breastfeed while taking this medication and for 1 week after the last dose. This medication may cause infertility. Talk to your care team if you are concerned about your fertility. What side effects may I notice from receiving this medication? Side effects that you should report to your care team as soon as possible: Allergic reactions--skin rash, itching, hives, swelling of the face, lips, tongue, or throat Infection--fever, chills, cough, sore throat, wounds that don't heal, pain or trouble when passing urine, general feeling of discomfort or being unwell Kidney injury--decrease in the amount of urine, swelling of the ankles, hands, or feet Liver injury--right upper belly pain, loss  of appetite, nausea, light-colored stool, dark yellow or brown urine, yellowing skin or eyes, unusual weakness or fatigue Low red blood cell level--unusual weakness or fatigue, dizziness, headache, trouble breathing Tumor lysis syndrome (TLS)--nausea, vomiting, diarrhea, decrease in the amount of urine, dark urine, unusual weakness or fatigue, confusion, muscle pain or cramps, fast or irregular heartbeat, joint pain Unusual bruising or bleeding Side effects that usually do not require medical attention (report to your care team if they continue or are bothersome): Constipation Diarrhea Nausea Pain, redness, or irritation at injection site Vomiting This list may not describe all possible side effects. Call your doctor for medical advice about side effects. You may report side effects to FDA at 1-800-FDA-1088. Where should I keep my medication? This medication is given in a hospital or clinic. It will not be stored at home. NOTE: This sheet is a summary. It may not cover all possible information. If you have questions about this medicine, talk to your doctor, pharmacist, or health care provider.  2024 Elsevier/Gold Standard (2021-07-19 00:00:00)        To help prevent nausea and vomiting after your treatment, we encourage you to take your nausea medication as directed.  BELOW ARE SYMPTOMS   THAT SHOULD BE REPORTED IMMEDIATELY: *FEVER GREATER THAN 100.4 F (38 C) OR HIGHER *CHILLS OR SWEATING *NAUSEA AND VOMITING THAT IS NOT CONTROLLED WITH YOUR NAUSEA MEDICATION *UNUSUAL SHORTNESS OF BREATH *UNUSUAL BRUISING OR BLEEDING *URINARY PROBLEMS (pain or burning when urinating, or frequent urination) *BOWEL PROBLEMS (unusual diarrhea, constipation, pain near the anus) TENDERNESS IN MOUTH AND THROAT WITH OR WITHOUT PRESENCE OF ULCERS (sore throat, sores in mouth, or a toothache) UNUSUAL RASH, SWELLING OR PAIN  UNUSUAL VAGINAL DISCHARGE OR ITCHING   Items with * indicate a potential emergency and  should be followed up as soon as possible or go to the Emergency Department if any problems should occur.  Please show the CHEMOTHERAPY ALERT CARD or IMMUNOTHERAPY ALERT CARD at check-in to the Emergency Department and triage nurse.  Should you have questions after your visit or need to cancel or reschedule your appointment, please contact [facility] [phone]  and follow the prompts.  Office hours are 8:00 a.m. to 4:30 p.m. Monday - Friday. Please note that voicemails left after 4:00 p.m. may not be returned until the following business day.  We are closed weekends and major holidays. You have access to a nurse at all times for urgent questions. Please call the main number to the clinic [phone] and follow the prompts.  For any non-urgent questions, you may also contact your provider using MyChart. We now offer e-Visits for anyone 18 and older to request care online for non-urgent symptoms. For details visit [email or web address].   Also download the MyChart app! Go to the app store, search "MyChart", open the app, select [facility], and log in with your MyChart username and password.   

## 2022-10-16 NOTE — Progress Notes (Signed)
Patient presents today for Vidaza infusion.  Patient is in satisfactory condition with no new complaints voiced.  Vital signs are stable.  Labs from 10/14/2022 reviewed and all labs are within treatment parameters.  We will proceed with treatment per MD orders.    Patient tolerated treatment well with no complaints voiced.  Patient left ambulatory in stable condition.  Vital signs stable at discharge.  Follow up as scheduled.

## 2022-10-17 ENCOUNTER — Other Ambulatory Visit (HOSPITAL_COMMUNITY): Payer: Self-pay

## 2022-10-17 ENCOUNTER — Inpatient Hospital Stay: Payer: Medicare HMO | Attending: Hematology

## 2022-10-17 VITALS — BP 127/77 | HR 59 | Temp 97.5°F | Resp 18

## 2022-10-17 DIAGNOSIS — D4622 Refractory anemia with excess of blasts 2: Secondary | ICD-10-CM | POA: Insufficient documentation

## 2022-10-17 DIAGNOSIS — Z5111 Encounter for antineoplastic chemotherapy: Secondary | ICD-10-CM | POA: Insufficient documentation

## 2022-10-17 DIAGNOSIS — D469 Myelodysplastic syndrome, unspecified: Secondary | ICD-10-CM

## 2022-10-17 DIAGNOSIS — Z95828 Presence of other vascular implants and grafts: Secondary | ICD-10-CM

## 2022-10-17 MED ORDER — HEPARIN SOD (PORK) LOCK FLUSH 100 UNIT/ML IV SOLN
500.0000 [IU] | Freq: Once | INTRAVENOUS | Status: AC | PRN
  Administered 2022-10-17: 500 [IU]

## 2022-10-17 MED ORDER — SODIUM CHLORIDE 0.9% FLUSH
10.0000 mL | INTRAVENOUS | Status: DC | PRN
  Administered 2022-10-17: 10 mL

## 2022-10-17 MED ORDER — SODIUM CHLORIDE 0.9 % IV SOLN
75.0000 mg/m2 | Freq: Once | INTRAVENOUS | Status: AC
  Administered 2022-10-17: 140 mg via INTRAVENOUS
  Filled 2022-10-17: qty 14

## 2022-10-17 MED ORDER — SODIUM CHLORIDE 0.9 % IV SOLN
10.0000 mg | Freq: Once | INTRAVENOUS | Status: AC
  Administered 2022-10-17: 10 mg via INTRAVENOUS
  Filled 2022-10-17: qty 10

## 2022-10-17 MED ORDER — SODIUM CHLORIDE 0.9 % IV SOLN: Freq: Once | INTRAVENOUS | Status: AC

## 2022-10-17 MED FILL — Dexamethasone Sodium Phosphate Inj 100 MG/10ML: INTRAMUSCULAR | Qty: 1 | Status: AC

## 2022-10-17 NOTE — Patient Instructions (Signed)
[facility] [place]  Discharge Instructions: Thank you for choosing [facility] to provide your oncology and hematology care.  If you have a lab appointment with the Cancer Center - please note that after April 8th, 2024, all labs will be drawn in the cancer center.  You do not have to check in or register with the main entrance as you have in the past but will complete your check-in in the cancer center.  Wear comfortable clothing and clothing appropriate for easy access to any Portacath or PICC line.   We strive to give you quality time with your provider. You may need to reschedule your appointment if you arrive late (15 or more minutes).  Arriving late affects you and other patients whose appointments are after yours.  Also, if you miss three or more appointments without notifying the office, you may be dismissed from the clinic at the provider's discretion.      For prescription refill requests, have your pharmacy contact our office and allow 72 hours for refills to be completed.    Today you received the following chemotherapy and/or immunotherapy agents: azacitadine      To help prevent nausea and vomiting after your treatment, we encourage you to take your nausea medication as directed.  BELOW ARE SYMPTOMS THAT SHOULD BE REPORTED IMMEDIATELY: *FEVER GREATER THAN 100.4 F (38 C) OR HIGHER *CHILLS OR SWEATING *NAUSEA AND VOMITING THAT IS NOT CONTROLLED WITH YOUR NAUSEA MEDICATION *UNUSUAL SHORTNESS OF BREATH *UNUSUAL BRUISING OR BLEEDING *URINARY PROBLEMS (pain or burning when urinating, or frequent urination) *BOWEL PROBLEMS (unusual diarrhea, constipation, pain near the anus) TENDERNESS IN MOUTH AND THROAT WITH OR WITHOUT PRESENCE OF ULCERS (sore throat, sores in mouth, or a toothache) UNUSUAL RASH, SWELLING OR PAIN  UNUSUAL VAGINAL DISCHARGE OR ITCHING   Items with * indicate a potential emergency and should be followed up as soon as possible or go to  the Emergency Department if any problems should occur.  Please show the CHEMOTHERAPY ALERT CARD or IMMUNOTHERAPY ALERT CARD at check-in to the Emergency Department and triage nurse.  Should you have questions after your visit or need to cancel or reschedule your appointment, please contact [facility] [phone]  and follow the prompts.  Office hours are 8:00 a.m. to 4:30 p.m. Monday - Friday. Please note that voicemails left after 4:00 p.m. may not be returned until the following business day.  We are closed weekends and major holidays. You have access to a nurse at all times for urgent questions. Please call the main number to the clinic [phone] and follow the prompts.  For any non-urgent questions, you may also contact your provider using MyChart. We now offer e-Visits for anyone 18 and older to request care online for non-urgent symptoms. For details visit [email or web address].   Also download the MyChart app! Go to the app store, search "MyChart", open the app, select [facility], and log in with your MyChart username and password.   

## 2022-10-17 NOTE — Progress Notes (Signed)
Treatment given per orders. Patient tolerated it well without problems. Vitals stable and discharged home from clinic ambulatory. Follow up as scheduled.  

## 2022-10-18 ENCOUNTER — Inpatient Hospital Stay: Payer: Medicare HMO

## 2022-10-18 VITALS — BP 147/86 | HR 60 | Temp 97.2°F | Resp 18

## 2022-10-18 DIAGNOSIS — Z5111 Encounter for antineoplastic chemotherapy: Secondary | ICD-10-CM | POA: Diagnosis not present

## 2022-10-18 DIAGNOSIS — Z95828 Presence of other vascular implants and grafts: Secondary | ICD-10-CM

## 2022-10-18 DIAGNOSIS — D469 Myelodysplastic syndrome, unspecified: Secondary | ICD-10-CM

## 2022-10-18 MED ORDER — SODIUM CHLORIDE 0.9 % IV SOLN
10.0000 mg | Freq: Once | INTRAVENOUS | Status: AC
  Administered 2022-10-18: 10 mg via INTRAVENOUS
  Filled 2022-10-18: qty 10

## 2022-10-18 MED ORDER — PALONOSETRON HCL INJECTION 0.25 MG/5ML
0.2500 mg | Freq: Once | INTRAVENOUS | Status: AC
  Administered 2022-10-18: 0.25 mg via INTRAVENOUS
  Filled 2022-10-18: qty 5

## 2022-10-18 MED ORDER — SODIUM CHLORIDE 0.9% FLUSH
10.0000 mL | INTRAVENOUS | Status: DC | PRN
  Administered 2022-10-18: 10 mL

## 2022-10-18 MED ORDER — SODIUM CHLORIDE 0.9 % IV SOLN: Freq: Once | INTRAVENOUS | Status: AC

## 2022-10-18 MED ORDER — SODIUM CHLORIDE 0.9 % IV SOLN
75.0000 mg/m2 | Freq: Once | INTRAVENOUS | Status: AC
  Administered 2022-10-18: 140 mg via INTRAVENOUS
  Filled 2022-10-18: qty 14

## 2022-10-18 MED ORDER — HEPARIN SOD (PORK) LOCK FLUSH 100 UNIT/ML IV SOLN
500.0000 [IU] | Freq: Once | INTRAVENOUS | Status: AC | PRN
  Administered 2022-10-18: 500 [IU]

## 2022-10-18 NOTE — Patient Instructions (Signed)
[facility]  Discharge Instructions: Thank you for choosing [facility] to provide your oncology and hematology care.  If you have a lab appointment with the Cancer Center - please note that after April 8th, 2024, all labs will be drawn in the cancer center.  You do not have to check in or register with the main entrance as you have in the past but will complete your check-in in the cancer center.  Wear comfortable clothing and clothing appropriate for easy access to any Portacath or PICC line.   We strive to give you quality time with your provider. You may need to reschedule your appointment if you arrive late (15 or more minutes).  Arriving late affects you and other patients whose appointments are after yours.  Also, if you miss three or more appointments without notifying the office, you may be dismissed from the clinic at the provider's discretion.      For prescription refill requests, havAzacitidine Injection What is this medication? AZACITIDINE (ay za SITE i deen) treats blood and bone marrow cancers. It works by slowing down the growth of cancer cells. This medicine may be used for other purposes; ask your health care provider or pharmacist if you have questions. COMMON BRAND NAME(S): Vidaza What should I tell my care team before I take this medication? They need to know if you have any of these conditions: Kidney disease Liver disease Low blood cell levels, such as low white cells, platelets, or red blood cells Low levels of albumin in the blood Low levels of bicarbonate in the blood An unusual or allergic reaction to azacitidine, mannitol, other medications, foods, dyes, or preservatives If you or your partner are pregnant or trying to get pregnant Breast-feeding How should I use this medication? This medication is injected into a vein or under the skin. It is given by your care team in a hospital or clinic setting. Talk to your care team about  the use of this medication in children. While it may be prescribed for children as young as 1 month for selected conditions, precautions do apply. Overdosage: If you think you have taken too much of this medicine contact a poison control center or emergency room at once. NOTE: This medicine is only for you. Do not share this medicine with others. What if I miss a dose? Keep appointments for follow-up doses. It is important not to miss your dose. Call your care team if you are unable to keep an appointment. What may interact with this medication? Interactions are not expected. This list may not describe all possible interactions. Give your health care provider a list of all the medicines, herbs, non-prescription drugs, or dietary supplements you use. Also tell them if you smoke, drink alcohol, or use illegal drugs. Some items may interact with your medicine. What should I watch for while using this medication? Your condition will be monitored carefully while you are receiving this medication. You may need blood work while taking this medication. This medication may make you feel generally unwell. This is not uncommon as chemotherapy can affect healthy cells as well as cancer cells. Report any side effects. Continue your course of treatment even though you feel ill unless your care team tells you to stop. Other product types may be available that contain the medication azacitidine. The injection and oral products should not be used in place of one another. Talk to your care team if you have questions. This medication can cause  serious side effects. To reduce the risk, your care team may give you other medications to take before receiving this one. Be sure to follow the directions from your care team. This medication may increase your risk of getting an infection. Call your care team for advice if you get a fever, chills, sore throat, or other symptoms of a cold or flu. Do not treat yourself. Try to avoid  being around people who are sick. Avoid taking medications that contain aspirin, acetaminophen, ibuprofen, naproxen, or ketoprofen unless instructed by your care team. These medications may hide a fever. This medication may increase your risk to bruise or bleed. Call your care team if you notice any unusual bleeding. Be careful brushing or flossing your teeth or using a toothpick because you may get an infection or bleed more easily. If you have any dental work done, tell your dentist you are receiving this medication. Talk to your care team if you or your partner may be pregnant. Serious birth defects can occur if you take this medication during pregnancy and for 6 months after the last dose. You will need a negative pregnancy test before starting this medication. Contraception is recommended while taking his medication and for 6 months after the last dose. Your care team can help you find the option that works for you. If your partner can get pregnant, use a condom during sex while taking this medication and for 3 months after the last dose. Do not breastfeed while taking this medication and for 1 week after the last dose. This medication may cause infertility. Talk to your care team if you are concerned about your fertility. What side effects may I notice from receiving this medication? Side effects that you should report to your care team as soon as possible: Allergic reactions--skin rash, itching, hives, swelling of the face, lips, tongue, or throat Infection--fever, chills, cough, sore throat, wounds that don't heal, pain or trouble when passing urine, general feeling of discomfort or being unwell Kidney injury--decrease in the amount of urine, swelling of the ankles, hands, or feet Liver injury--right upper belly pain, loss of appetite, nausea, light-colored stool, dark yellow or brown urine, yellowing skin or eyes, unusual weakness or fatigue Low red blood cell level--unusual weakness or  fatigue, dizziness, headache, trouble breathing Tumor lysis syndrome (TLS)--nausea, vomiting, diarrhea, decrease in the amount of urine, dark urine, unusual weakness or fatigue, confusion, muscle pain or cramps, fast or irregular heartbeat, joint pain Unusual bruising or bleeding Side effects that usually do not require medical attention (report to your care team if they continue or are bothersome): Constipation Diarrhea Nausea Pain, redness, or irritation at injection site Vomiting This list may not describe all possible side effects. Call your doctor for medical advice about side effects. You may report side effects to FDA at 1-800-FDA-1088. Where should I keep my medication? This medication is given in a hospital or clinic. It will not be stored at home. NOTE: This sheet is a summary. It may not cover all possible information. If you have questions about this medicine, talk to your doctor, pharmacist, or health care provider.  2024 Elsevier/Gold Standard (2021-07-19 00:00:00) e your pharmacy contact our office and allow 72 hours for refills to be completed.    Today you received the following chemotherapy and/or immunotherapy agents D5 Vidaza   To help prevent nausea and vomiting after your treatment, we encourage you to take your nausea medication as directed.    BELOW ARE SYMPTOMS THAT SHOULD BE  REPORTED IMMEDIATELY: *FEVER GREATER THAN 100.4 F (38 C) OR HIGHER *CHILLS OR SWEATING *NAUSEA AND VOMITING THAT IS NOT CONTROLLED WITH YOUR NAUSEA MEDICATION *UNUSUAL SHORTNESS OF BREATH *UNUSUAL BRUISING OR BLEEDING *URINARY PROBLEMS (pain or burning when urinating, or frequent urination) *BOWEL PROBLEMS (unusual diarrhea, constipation, pain near the anus) TENDERNESS IN MOUTH AND THROAT WITH OR WITHOUT PRESENCE OF ULCERS (sore throat, sores in mouth, or a toothache) UNUSUAL RASH, SWELLING OR PAIN  UNUSUAL VAGINAL DISCHARGE OR ITCHING   Items with * indicate a potential emergency and  should be followed up as soon as possible or go to the Emergency Department if any problems should occur.  Please show the CHEMOTHERAPY ALERT CARD or IMMUNOTHERAPY ALERT CARD at check-in to the Emergency Department and triage nurse.  Should you have questions after your visit or need to cancel or reschedule your appointment, please contact [facility] [phone]  and follow the prompts.  Office hours are 8:00 a.m. to 4:30 p.m. Monday - Friday. Please note that voicemails left after 4:00 p.m. may not be returned until the following business day.  We are closed weekends and major holidays. You have access to a nurse at all times for urgent questions. Please call the main number to the clinic [phone] and follow the prompts.  For any non-urgent questions, you may also contact your provider using MyChart. We now offer e-Visits for anyone 19 and older to request care online for non-urgent symptoms. For details visit [email or web address].   Also download the MyChart app! Go to the app store, search "MyChart", open the app, select [facility], and log in with your MyChart username and password.

## 2022-10-18 NOTE — Progress Notes (Signed)
Pt presents today for D5 Vidaza per provider's order. Vital signs stable and pt voiced no new complaints at this time.  Treatment given today per MD orders. Tolerated infusion without adverse affects. Vital signs stable. No complaints at this time. Discharged from clinic ambulatory in stable condition. Alert and oriented x 3. F/U with [facility] as scheduled.

## 2022-11-05 ENCOUNTER — Other Ambulatory Visit (HOSPITAL_COMMUNITY): Payer: Self-pay

## 2022-11-05 ENCOUNTER — Other Ambulatory Visit: Payer: Self-pay

## 2022-11-05 ENCOUNTER — Other Ambulatory Visit: Payer: Self-pay | Admitting: *Deleted

## 2022-11-05 ENCOUNTER — Other Ambulatory Visit: Payer: Self-pay | Admitting: Hematology

## 2022-11-05 DIAGNOSIS — D469 Myelodysplastic syndrome, unspecified: Secondary | ICD-10-CM

## 2022-11-05 MED ORDER — VENETOCLAX 100 MG PO TABS
200.0000 mg | ORAL_TABLET | Freq: Every day | ORAL | 0 refills | Status: DC
  Filled 2022-11-05 (×2): qty 28, 28d supply, fill #0

## 2022-11-05 NOTE — Telephone Encounter (Signed)
Venclexta refill approved.  Patient is tolerating and is to continue therapy.

## 2022-11-15 ENCOUNTER — Other Ambulatory Visit (HOSPITAL_COMMUNITY): Payer: Self-pay

## 2022-11-23 ENCOUNTER — Other Ambulatory Visit: Payer: Self-pay

## 2022-11-24 NOTE — Progress Notes (Signed)
[facility] [address], [place] [address]    Clinic Day:  11/25/2022  Referring physician: Aliene Beams, MD  Patient Care Team: Aliene Beams, MD as PCP - General (Family Medicine) Aliene Beams, MD (Family Medicine) [place] Bali, MD (Inactive) as Consulting Physician (Gastroenterology) Doreatha Massed, MD as Medical Oncologist (Hematology)   ASSESSMENT & PLAN:   Assessment: 1. MDS with EB 2: - Seen at the request of Dr. Tracie Harrier for cytopenias - BMBX on 06/04/2021: Hypercellular marrow with dyspoietic changes involving the granulocytic cell line and megakaryocytes.  Myeloblasts 8% on aspirate smears and 10% by flow.  Cytogenetics 89, XY (20).  MDS FISH panel normal. 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 - IPSS-M score of 0.12, moderate high risk.  Leukemia free survival is 2.3 years.  Overall survival 2.8 years. - Cycle 1 of azacitidine on 07/30/2021.  BMBX (01/04/2022): Overall improvement in blast count and cellularity.  Chromosome analysis and FISH panel normal.  Venetoclax 200 mg day 1 through 14 added with cycle 8 on 02/18/2022. - BMBX (06/05/2022): Slightly hypercellular for age with very mild dyspoietic changes at best associated with increased number of eosinophilic cells.  No increase in blast cells identified. - Azacitidine 5 days and venetoclax 200 mg days 1-14 every 6 weeks on 07/22/2022   2. Social/family history: - He lives at home with his wife.  He swims about 40 minutes daily. 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  He also worked as a Geologist, engineering.  Non-smoker. - Paternal aunt had breast cancer and mother had pancreatic cancer.  3.  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 - He underwent thrombolytic therapy at [facility]. - He will followed up with vascular at [facility] and has been on Xarelto since then.    Plan: 1. MDS with EB 2: - He has tolerated last  cycle of azacitidine and venetoclax (cycle 14) on 10/14/2022 very well. - He did not have any major side effects or infections. - Reviewed labs today which showed normal LFTs.  CBC was normal. - Proceed with cycle 15 of azacitidine 75 mg/m days 1-5 and venetoclax 200 mg daily, days 1-14. - RTC 6 weeks for follow-up.   2. Unprovoked left subclavian DVT: - Xarelto discontinued due to hemorrhoidal bleeding.  No evidence of recurrence.   3. Constipation: - Continue Colace and MiraLAX.  This is well-controlled.    Orders Placed This Encounter  Procedures   Magnesium    Standing Status:   Future    Standing Expiration Date:   02/03/2024   Comprehensive metabolic panel    Standing Status:   Future    Standing Expiration Date:   02/03/2024   CBC with Differential    Standing Status:   Future    Standing Expiration Date:   02/03/2024   Magnesium    Standing Status:   Future    Standing Expiration Date:   03/02/2024   Comprehensive metabolic panel    Standing Status:   Future    Standing Expiration Date:   03/02/2024   CBC with Differential    Standing Status:   Future    Standing Expiration Date:   03/02/2024      I,Katie Daubenspeck,acting as a scribe for Doreatha Massed, MD.,have documented all relevant documentation on the behalf of Doreatha Massed, MD,as directed by  Doreatha Massed, MD while in the presence of [facility]  Ellin Saba, MD.   I, Doreatha Massed MD, have reviewed the above documentation for accuracy and completeness, and I agree with the above.   Doreatha Massed, MD   9/9/202412:27 PM  CHIEF COMPLAINT:   Diagnosis: MDS with EB 2    Cancer Staging  No matching staging information was found for the patient.    Prior Therapy: none  Current Therapy:  Azacitidine and venetoclax    HISTORY OF PRESENT ILLNESS:   Oncology History  Myelodysplastic syndrome (HCC)  06/17/2021 Initial Diagnosis   Myelodysplastic syndrome (HCC)   07/30/2021  - 10/30/2021 Chemotherapy   Patient is on Treatment Plan : MYELODYSPLASIA  Azacitidine IV D1-7 q28d     07/30/2021 -  Chemotherapy   Patient is on Treatment Plan : MYELODYSPLASIA  Azacitidine IV D1-5 q42d        INTERVAL HISTORY:   Chad Lamb is a 77 y.o. male presenting to clinic today for follow up of MDS with EB 2. He was last seen by me on 10/14/22.  Today, he states that he is doing well overall. His appetite level is at 100%. His energy level is at 100%.  PAST MEDICAL HISTORY:   Past Medical History: Past Medical History:  Diagnosis Date   Allergy    Anxiety    Arthritis    Asthma    Cataract    Clotting disorder (HCC)    DVT (deep venous thrombosis) (HCC)    unknown etiology. [facility]   GERD (gastroesophageal reflux disease)    History of dental surgery    feb 2023   Hyperlipidemia    Hypertension    MDS (myelodysplastic syndrome), high grade (HCC) 06/17/2021   Pneumonia    Port-A-Cath in place 07/30/2021    Surgical History: Past Surgical History:  Procedure Laterality Date   CATARACT EXTRACTION W/PHACO Right 11/24/2017   Procedure: CATARACT EXTRACTION PHACO AND INTRAOCULAR LENS PLACEMENT RIGHT EYE;  Surgeon: Gemma Payor, MD;  Location: [facility];  Service: Ophthalmology;  Laterality: Right;  CDE: 8.61   CATARACT EXTRACTION W/PHACO Left 12/29/2017   Procedure: CATARACT EXTRACTION PHACO AND INTRAOCULAR LENS PLACEMENT (IOC);  Surgeon: Gemma Payor, MD;  Location: [facility];  Service: Ophthalmology;  Laterality: Left;  CDE: 7.21   GANGLION CYST EXCISION     HERNIA REPAIR     bilateral   IR BONE MARROW BIOPSY & ASPIRATION  06/05/2022   PARS PLANA VITRECTOMY Left 05/17/2021   Procedure: PARS PLANA VITRECTOMY 25 GAUGE WITH ANTIBIOTIC INJECTION  AND ENDOLASER LEFT EYE;  Surgeon: Carmela Rima, MD;  Location: [facility];  Service: Ophthalmology;  Laterality: Left;   PORTACATH PLACEMENT Right 07/09/2021   Procedure: INSERTION PORT-A-CATH;  Surgeon: Franky Macho, MD;  Location: [facility];   Service: General;  Laterality: Right;   TONSILLECTOMY     VASCULAR SURGERY      Social History: Social History   Socioeconomic History   Marital status: Married    Spouse name: Harriett Sine   Number of children: 0   Years of education: 13   Highest education level: High school graduate  Occupational History   Not on file  Tobacco Use   Smoking status: Former    Types: Pipe    Quit date: 03/21/1966    Years since quitting: 56.7   Smokeless tobacco: Never  Vaping Use   Vaping status: Never Used  Substance and Sexual Activity   Alcohol use: Yes    Alcohol/week: 1.0 standard drink of alcohol    Types: 1 Cans of beer  per week    Comment: 1 can of beer a week   Drug use: Never   Sexual activity: Not Currently  Other Topics Concern   Not on file  Social History Narrative   ** Merged History Encounter **       Grew up in [place], [place] and [place].  Retired.  Worked in [employer]. 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    Social Determinants of Health   Financial Resource Strain: Low Risk  (01/23/2017)   Overall Financial Resource Strain (CARDIA)    Difficulty of Paying Living Expenses: Not hard at all  Food Insecurity: No Food Insecurity (01/23/2017)   Hunger Vital Sign    Worried About Running Out of Food in the Last Year: Never true    Ran Out of Food in the Last Year: Never true  Transportation Needs: No Transportation Needs (01/23/2017)   PRAPARE - Administrator, Civil Service (Medical): No    Lack of Transportation (Non-Medical): No  Physical Activity: Unknown (01/23/2017)   Exercise Vital Sign    Days of Exercise per Week: 3 days    Minutes of Exercise per Session: Not on file  Stress: Stress Concern Present (01/23/2017)   [employer] of Occupational Health - Occupational Stress Questionnaire    Feeling of Stress : To some extent  Social Connections: Somewhat Isolated (01/23/2017)   Social Connection and  Isolation Panel [NHANES]    Frequency of Communication with Friends and Family: More than three times a week    Frequency of Social Gatherings with Friends and Family: More than three times a week    Attends Religious Services: Never    Database administrator or Organizations: No    Attends Banker Meetings: Never    Marital Status: Married  Catering manager Violence: Not At Risk (01/23/2017)   Humiliation, Afraid, Rape, and Kick questionnaire    Fear of Current or Ex-Partner: No    Emotionally Abused: No    Physically Abused: No    Sexually Abused: No    Family History: Family History  Problem Relation Age of Onset   Cancer Mother        pancreatic   Arthritis Father    Early death Father 24       Lung infiltrate   Colon cancer Neg Hx    Colon polyps Neg Hx     Current Medications:  Current Outpatient Medications:    albuterol (PROVENTIL HFA;VENTOLIN HFA) 108 (90 Base) MCG/ACT inhaler, Inhale 1-2 puffs into the lungs every 6 (six) hours as needed for wheezing or shortness of breath., Disp: , Rfl:    ALPRAZolam (XANAX) 0.5 MG tablet, Take 0.5 mg by mouth 2 (two) times daily as needed for anxiety., Disp: , Rfl:    amLODipine (NORVASC) 2.5 MG tablet, Take 2.5 mg by mouth daily., Disp: , Rfl:    azaCITIDine 5 mg/2 mLs in lactated ringers infusion, Inject into the vein daily. Days 1-7 every 28 days, Disp: , Rfl:    DENTA 5000 PLUS 1.1 % CREA dental cream, Take by mouth., Disp: , Rfl:    fluticasone furoate-vilanterol (BREO ELLIPTA) 200-25 MCG/ACT AEPB, Inhale 1 puff into the lungs daily., Disp: , Rfl:    Lactulose 20 GM/30ML SOLN, Take 30 mLs (20 g total) by mouth at bedtime., Disp: 473 mL, Rfl: 3   Lidocaine-Hydrocort, Perianal, 3-0.5 % CREA, Apply topically 2 (two) times daily as needed., Disp: ,  Rfl:    Lidocaine-Hydrocortisone Ace 3-0.5 % CREA, Apply 1 application topically as directed. (Patient taking differently: Apply 1 application  topically 2 (two) times daily  as needed (hemorrhoids).), Disp: 30 Tube, Rfl: 11   loratadine (CLARITIN) 10 MG tablet, Take 10 mg by mouth daily., Disp: , Rfl:    Lutein 40 MG CAPS, Take 40 mg by mouth daily., Disp: , Rfl:    Multiple Vitamins-Minerals (CENTRUM ADULTS PO), Take 1 tablet by mouth daily., Disp: , Rfl:    nystatin cream (MYCOSTATIN), Apply 1 application. topically 2 (two) times daily as needed for dry skin., Disp: , Rfl:    ofloxacin (OCUFLOX) 0.3 % ophthalmic solution, Place 1 drop into the left eye See admin instructions. 1 drop to left eye 4 x daily for 7 days after monthly procedure., Disp: , Rfl:    omeprazole (PRILOSEC OTC) 20 MG tablet, Take 5-10 mg by mouth daily., Disp: , Rfl:    sildenafil (VIAGRA) 50 MG tablet, Take 50 mg by mouth daily as needed for erectile dysfunction., Disp: , Rfl:    traMADol (ULTRAM) 50 MG tablet, Take 1 tablet (50 mg total) by mouth every 6 (six) hours as needed., Disp: 15 tablet, Rfl: 0   venetoclax (VENCLEXTA) 100 MG tablet, Take 2 tablets (200 mg total) by mouth daily. Take for 14 days, then hold for 14 days. Repeat every 28 days. Take with a meal and a full glass of water., Disp: 28 tablet, Rfl: 0   lidocaine-prilocaine (EMLA) cream, Apply a SMALL AMOUNT TO port a cath site (DO not RUB in) AND cover with PLASTIC WRAP ONE hour prior TO infusion appointment (Patient not taking: Reported on 11/25/2022), Disp: 30 g, Rfl: 3 No current facility-administered medications for this visit.  Facility-Administered Medications Ordered in Other Visits:    azaCITIDine (VIDAZA) 140 mg in sodium chloride 0.9 % 50 mL chemo infusion, 75 mg/m2 (Treatment Plan Recorded), Intravenous, Once, Doreatha Massed, MD, Last Rate: 256 mL/hr at 11/25/22 1216, 140 mg at 11/25/22 1216   heparin lock flush 100 unit/mL, 500 Units, Intracatheter, Once PRN, Doreatha Massed, MD   sodium chloride flush (NS) 0.9 % injection 10 mL, 10 mL, Intracatheter, PRN, Doreatha Massed, MD, 10 mL at 06/12/22 1606    sodium chloride flush (NS) 0.9 % injection 10 mL, 10 mL, Intracatheter, PRN, Doreatha Massed, MD   Allergies: Allergies  Allergen Reactions   Cephalosporins Itching   Amoxapine And Related Itching and Rash    Has patient had a PCN reaction causing immediate rash, facial/tongue/throat swelling, SOB or lightheadedness with hypotension: No Has patient had a PCN reaction causing severe rash involving mucus membranes or skin necrosis: No Has patient had a PCN reaction that required hospitalization: No Has patient had a PCN reaction occurring within the last 10 years: No If all of the above answers are "NO", then may proceed with Cephalosporin use.     Amoxicillin Rash   Cephalexin Rash   Clindamycin/Lincomycin Itching and Rash   Sulfamethoxazole Itching and Rash   Trimethoprim Rash    REVIEW OF SYSTEMS:   Review of Systems  Constitutional:  Negative for chills, fatigue and fever.  HENT:   Negative for lump/mass, mouth sores, nosebleeds, sore throat and trouble swallowing.   Eyes:  Negative for eye problems.  Respiratory:  Negative for cough and shortness of breath.   Cardiovascular:  Negative for chest pain, leg swelling and palpitations.  Gastrointestinal:  Positive for constipation. Negative for abdominal pain, diarrhea, nausea and vomiting.  Genitourinary:  Negative for bladder incontinence, difficulty urinating, dysuria, frequency, hematuria and nocturia.   Musculoskeletal:  Negative for arthralgias, back pain, flank pain, myalgias and neck pain.  Skin:  Negative for itching and rash.  Neurological:  Negative for dizziness, headaches and numbness.  Hematological:  Does not bruise/bleed easily.  Psychiatric/Behavioral:  Positive for depression. Negative for sleep disturbance and suicidal ideas. The patient is not nervous/anxious.   All other systems reviewed and are negative.    VITALS:   There were no vitals taken for this visit.  Wt Readings from Last 3 Encounters:   11/25/22 155 lb 6.4 oz (70.5 kg)  10/14/22 154 lb 1.6 oz (69.9 kg)  09/02/22 155 lb 12.8 oz (70.7 kg)    There is no height or weight on file to calculate BMI.  Performance status (ECOG): 1 - Symptomatic but completely ambulatory  PHYSICAL EXAM:   Physical Exam Vitals and nursing note reviewed. Exam conducted with a chaperone present.  Constitutional:      Appearance: Normal appearance.  Cardiovascular:     Rate and Rhythm: Normal rate and regular rhythm.     Pulses: Normal pulses.     Heart sounds: Normal heart sounds.  Pulmonary:     Effort: Pulmonary effort is normal.     Breath sounds: Normal breath sounds.  Abdominal:     Palpations: Abdomen is soft. There is no hepatomegaly, splenomegaly or mass.     Tenderness: There is no abdominal tenderness.  Musculoskeletal:     Right lower leg: No edema.     Left lower leg: No edema.  Lymphadenopathy:     Cervical: No cervical adenopathy.     Right cervical: No superficial, deep or posterior cervical adenopathy.    Left cervical: No superficial, deep or posterior cervical adenopathy.     Upper Body:     Right upper body: No supraclavicular or axillary adenopathy.     Left upper body: No supraclavicular or axillary adenopathy.  Neurological:     General: No focal deficit present.     Mental Status: He is alert and oriented to person, place, and time.  Psychiatric:        Mood and Affect: Mood normal.        Behavior: Behavior normal.     LABS:      Latest Ref Rng & Units 11/25/2022    9:59 AM 10/14/2022    1:08 PM 09/02/2022   11:41 AM  CBC  WBC 4.0 - 10.5 K/uL 6.4  5.8  5.3   Hemoglobin 13.0 - 17.0 g/dL 40.9  81.1  91.4   Hematocrit 39.0 - 52.0 % 37.4  36.7  35.0   Platelets 150 - 400 K/uL 218  209  207       Latest Ref Rng & Units 11/25/2022    9:59 AM 10/14/2022    1:08 PM 09/02/2022   11:41 AM  CMP  Glucose 70 - 99 mg/dL 782  956  213   BUN 8 - 23 mg/dL 12  14  12    Creatinine 0.61 - 1.24 mg/dL 0.86  5.78  4.69    Sodium 135 - 145 mmol/L 136  139  139   Potassium 3.5 - 5.1 mmol/L 3.6  3.6  3.6   Chloride 98 - 111 mmol/L 104  108  106   CO2 22 - 32 mmol/L 25  26  24    Calcium 8.9 - 10.3 mg/dL 9.1  9.3  9.3   Total  Protein 6.5 - 8.1 g/dL 6.0  6.1  5.8   Total Bilirubin 0.3 - 1.2 mg/dL 1.2  1.5  1.3   Alkaline Phos 38 - 126 U/L 65  64  56   AST 15 - 41 U/L 21  19  22    ALT 0 - 44 U/L 16  17  16       No results found for: "CEA1", "CEA" / No results found for: "CEA1", "CEA" No results found for: "PSA1" No results found for: "ZOX096" No results found for: "CAN125"  Lab Results  Component Value Date   TOTALPROTELP 6.3 01/05/2021   ALBUMINELP 3.3 01/05/2021   A1GS 0.3 01/05/2021   A2GS 0.8 01/05/2021   BETS 1.0 01/05/2021   GAMS 1.0 01/05/2021   MSPIKE Not Observed 01/05/2021   SPEI Comment 01/05/2021   No results found for: "TIBC", "FERRITIN", "IRONPCTSAT" Lab Results  Component Value Date   LDH 147 11/20/2021   LDH 143 10/22/2021   LDH 144 10/16/2021     STUDIES:   No results found.

## 2022-11-25 ENCOUNTER — Inpatient Hospital Stay (HOSPITAL_BASED_OUTPATIENT_CLINIC_OR_DEPARTMENT_OTHER): Payer: Medicare HMO | Admitting: Hematology

## 2022-11-25 ENCOUNTER — Inpatient Hospital Stay: Payer: Medicare HMO

## 2022-11-25 ENCOUNTER — Inpatient Hospital Stay: Payer: Medicare HMO | Attending: Hematology

## 2022-11-25 VITALS — BP 130/79 | HR 54 | Temp 96.6°F | Resp 16

## 2022-11-25 DIAGNOSIS — D469 Myelodysplastic syndrome, unspecified: Secondary | ICD-10-CM

## 2022-11-25 DIAGNOSIS — D4622 Refractory anemia with excess of blasts 2: Secondary | ICD-10-CM | POA: Insufficient documentation

## 2022-11-25 DIAGNOSIS — Z95828 Presence of other vascular implants and grafts: Secondary | ICD-10-CM

## 2022-11-25 DIAGNOSIS — Z5111 Encounter for antineoplastic chemotherapy: Secondary | ICD-10-CM | POA: Diagnosis present

## 2022-11-25 LAB — CBC WITH DIFFERENTIAL/PLATELET
Abs Immature Granulocytes: 0.02 K/uL (ref 0.00–0.07)
Basophils Absolute: 0.1 K/uL (ref 0.0–0.1)
Basophils Relative: 1 %
Eosinophils Absolute: 1 K/uL — ABNORMAL HIGH (ref 0.0–0.5)
Eosinophils Relative: 15 %
HCT: 37.4 % — ABNORMAL LOW (ref 39.0–52.0)
Hemoglobin: 11.9 g/dL — ABNORMAL LOW (ref 13.0–17.0)
Immature Granulocytes: 0 %
Lymphocytes Relative: 20 %
Lymphs Abs: 1.3 K/uL (ref 0.7–4.0)
MCH: 26 pg (ref 26.0–34.0)
MCHC: 31.8 g/dL (ref 30.0–36.0)
MCV: 81.7 fL (ref 80.0–100.0)
Monocytes Absolute: 0.7 K/uL (ref 0.1–1.0)
Monocytes Relative: 11 %
Neutro Abs: 3.4 K/uL (ref 1.7–7.7)
Neutrophils Relative %: 53 %
Platelets: 218 K/uL (ref 150–400)
RBC: 4.58 MIL/uL (ref 4.22–5.81)
RDW: 18.1 % — ABNORMAL HIGH (ref 11.5–15.5)
WBC: 6.4 K/uL (ref 4.0–10.5)
nRBC: 0 % (ref 0.0–0.2)

## 2022-11-25 LAB — COMPREHENSIVE METABOLIC PANEL WITH GFR
ALT: 16 U/L (ref 0–44)
AST: 21 U/L (ref 15–41)
Albumin: 3.8 g/dL (ref 3.5–5.0)
Alkaline Phosphatase: 65 U/L (ref 38–126)
Anion gap: 7 (ref 5–15)
BUN: 12 mg/dL (ref 8–23)
CO2: 25 mmol/L (ref 22–32)
Calcium: 9.1 mg/dL (ref 8.9–10.3)
Chloride: 104 mmol/L (ref 98–111)
Creatinine, Ser: 0.94 mg/dL (ref 0.61–1.24)
GFR, Estimated: >60 mL/min (ref >60)
Glucose, Bld: 127 mg/dL — ABNORMAL HIGH (ref 70–99)
Potassium: 3.6 mmol/L (ref 3.5–5.1)
Sodium: 136 mmol/L (ref 135–145)
Total Bilirubin: 1.2 mg/dL (ref 0.3–1.2)
Total Protein: 6 g/dL — ABNORMAL LOW (ref 6.5–8.1)

## 2022-11-25 LAB — MAGNESIUM: Magnesium: 2.1 mg/dL (ref 1.7–2.4)

## 2022-11-25 MED ORDER — SODIUM CHLORIDE 0.9% FLUSH
10.0000 mL | INTRAVENOUS | Status: DC | PRN
  Administered 2022-11-25: 10 mL

## 2022-11-25 MED ORDER — HEPARIN SOD (PORK) LOCK FLUSH 100 UNIT/ML IV SOLN
500.0000 [IU] | Freq: Once | INTRAVENOUS | Status: AC | PRN
  Administered 2022-11-25: 500 [IU]

## 2022-11-25 MED ORDER — SODIUM CHLORIDE 0.9 % IV SOLN: Freq: Once | INTRAVENOUS | Status: AC

## 2022-11-25 MED ORDER — SODIUM CHLORIDE 0.9 % IV SOLN
75.0000 mg/m2 | Freq: Once | INTRAVENOUS | Status: AC
  Administered 2022-11-25: 140 mg via INTRAVENOUS
  Filled 2022-11-25: qty 14

## 2022-11-25 MED ORDER — SODIUM CHLORIDE 0.9 % IV SOLN
10.0000 mg | Freq: Once | INTRAVENOUS | Status: AC
  Administered 2022-11-25: 10 mg via INTRAVENOUS
  Filled 2022-11-25: qty 1

## 2022-11-25 MED ORDER — PALONOSETRON HCL INJECTION 0.25 MG/5ML
0.2500 mg | Freq: Once | INTRAVENOUS | Status: AC
  Administered 2022-11-25: 0.25 mg via INTRAVENOUS
  Filled 2022-11-25: qty 5

## 2022-11-25 NOTE — Patient Instructions (Signed)
[facility]  Discharge Instructions: Thank you for choosing [facility] to provide your oncology and hematology care.  If you have a lab appointment with the Cancer Center - please note that after April 8th, 2024, all labs will be drawn in the cancer center.  You do not have to check in or register with the main entrance as you have in the past but will complete your check-in in the cancer center.  Wear comfortable clothing and clothing appropriate for easy access to any Portacath or PICC line.   We strive to give you quality time with your provider. You may need to reschedule your appointment if you arrive late (15 or more minutes).  Arriving late affects you and other patients whose appointments are after yours.  Also, if you miss three or more appointments without notifying the office, you may be dismissed from the clinic at the provider's discretion.      For prescription refill requests, have your pharmacy contact our office and allow 72 hours for refills to be completed.    Today you received the following chemotherapy and/or immunotherapy agents Azacitadine   To help prevent nausea and vomiting after your treatment, we encourage you to take your nausea medication as directed.  BELOW ARE SYMPTOMS THAT SHOULD BE REPORTED IMMEDIATELY: *FEVER GREATER THAN 100.4 F (38 C) OR HIGHER *CHILLS OR SWEATING *NAUSEA AND VOMITING THAT IS NOT CONTROLLED WITH YOUR NAUSEA MEDICATION *UNUSUAL SHORTNESS OF BREATH *UNUSUAL BRUISING OR BLEEDING *URINARY PROBLEMS (pain or burning when urinating, or frequent urination) *BOWEL PROBLEMS (unusual diarrhea, constipation, pain near the anus) TENDERNESS IN MOUTH AND THROAT WITH OR WITHOUT PRESENCE OF ULCERS (sore throat, sores in mouth, or a toothache) UNUSUAL RASH, SWELLING OR PAIN  UNUSUAL VAGINAL DISCHARGE OR ITCHING   Items with * indicate a potential emergency and should be followed up as soon as possible or go to the  Emergency Department if any problems should occur.  Please show the CHEMOTHERAPY ALERT CARD or IMMUNOTHERAPY ALERT CARD at check-in to the Emergency Department and triage nurse.  Should you have questions after your visit or need to cancel or reschedule your appointment, please contact [facility] [phone]  and follow the prompts.  Office hours are 8:00 a.m. to 4:30 p.m. Monday - Friday. Please note that voicemails left after 4:00 p.m. may not be returned until the following business day.  We are closed weekends and major holidays. You have access to a nurse at all times for urgent questions. Please call the main number to the clinic [phone] and follow the prompts.  For any non-urgent questions, you may also contact your provider using MyChart. We now offer e-Visits for anyone 59 and older to request care online for non-urgent symptoms. For details visit [email or web address].   Also download the MyChart app! Go to the app store, search "MyChart", open the app, select [place], and log in with your MyChart username and password.

## 2022-11-25 NOTE — Patient Instructions (Signed)

## 2022-11-25 NOTE — Progress Notes (Signed)
Labs reviewed with MD today at office visit. Ok to treat per MD.   Treatment given per orders. Patient tolerated it well without problems. Vitals stable and discharged home from clinic ambulatory. Follow up as scheduled.  

## 2022-11-25 NOTE — Progress Notes (Signed)
Patient has been examined by Dr. Katragadda. Vital signs and labs have been reviewed by MD - ANC, Creatinine, LFTs, hemoglobin, and platelets are within treatment parameters per M.D. - pt may proceed with treatment.  Primary RN and pharmacy notified.  

## 2022-11-26 ENCOUNTER — Inpatient Hospital Stay: Payer: Medicare HMO

## 2022-11-26 VITALS — BP 125/77 | HR 63 | Temp 97.0°F | Resp 18

## 2022-11-26 DIAGNOSIS — Z95828 Presence of other vascular implants and grafts: Secondary | ICD-10-CM

## 2022-11-26 DIAGNOSIS — Z5111 Encounter for antineoplastic chemotherapy: Secondary | ICD-10-CM | POA: Diagnosis not present

## 2022-11-26 DIAGNOSIS — D469 Myelodysplastic syndrome, unspecified: Secondary | ICD-10-CM

## 2022-11-26 MED ORDER — SODIUM CHLORIDE 0.9% FLUSH
10.0000 mL | INTRAVENOUS | Status: DC | PRN
  Administered 2022-11-26: 10 mL

## 2022-11-26 MED ORDER — SODIUM CHLORIDE 0.9 % IV SOLN
75.0000 mg/m2 | Freq: Once | INTRAVENOUS | Status: AC
  Administered 2022-11-26: 140 mg via INTRAVENOUS
  Filled 2022-11-26: qty 14

## 2022-11-26 MED ORDER — HEPARIN SOD (PORK) LOCK FLUSH 100 UNIT/ML IV SOLN
500.0000 [IU] | Freq: Once | INTRAVENOUS | Status: AC | PRN
  Administered 2022-11-26: 500 [IU]

## 2022-11-26 MED ORDER — SODIUM CHLORIDE 0.9 % IV SOLN
10.0000 mg | Freq: Once | INTRAVENOUS | Status: AC
  Administered 2022-11-26: 10 mg via INTRAVENOUS
  Filled 2022-11-26: qty 10

## 2022-11-26 MED ORDER — SODIUM CHLORIDE 0.9 % IV SOLN: Freq: Once | INTRAVENOUS | Status: AC

## 2022-11-26 NOTE — Patient Instructions (Signed)
[facility] [place]  Discharge Instructions: Thank you for choosing [facility] to provide your oncology and hematology care.  If you have a lab appointment with the Cancer Center - please note that after April 8th, 2024, all labs will be drawn in the cancer center.  You do not have to check in or register with the main entrance as you have in the past but will complete your check-in in the cancer center.  Wear comfortable clothing and clothing appropriate for easy access to any Portacath or PICC line.   We strive to give you quality time with your provider. You may need to reschedule your appointment if you arrive late (15 or more minutes).  Arriving late affects you and other patients whose appointments are after yours.  Also, if you miss three or more appointments without notifying the office, you may be dismissed from the clinic at the provider's discretion.      For prescription refill requests, have your pharmacy contact our office and allow 72 hours for refills to be completed.    Today you received the following chemotherapy and/or immunotherapy agents Vidaza.  Azacitidine Injection What is this medication? AZACITIDINE (ay za SITE i deen) treats blood and bone marrow cancers. It works by slowing down the growth of cancer cells. This medicine may be used for other purposes; ask your health care provider or pharmacist if you have questions. COMMON BRAND NAME(S): Vidaza What should I tell my care team before I take this medication? They need to know if you have any of these conditions: Kidney disease Liver disease Low blood cell levels, such as low white cells, platelets, or red blood cells Low levels of albumin in the blood Low levels of bicarbonate in the blood An unusual or allergic reaction to azacitidine, mannitol, other medications, foods, dyes, or preservatives If you or your partner are pregnant or trying to get pregnant Breast-feeding How should I  use this medication? This medication is injected into a vein or under the skin. It is given by your care team in a hospital or clinic setting. Talk to your care team about the use of this medication in children. While it may be prescribed for children as young as 1 month for selected conditions, precautions do apply. Overdosage: If you think you have taken too much of this medicine contact a poison control center or emergency room at once. NOTE: This medicine is only for you. Do not share this medicine with others. What if I miss a dose? Keep appointments for follow-up doses. It is important not to miss your dose. Call your care team if you are unable to keep an appointment. What may interact with this medication? Interactions are not expected. This list may not describe all possible interactions. Give your health care provider a list of all the medicines, herbs, non-prescription drugs, or dietary supplements you use. Also tell them if you smoke, drink alcohol, or use illegal drugs. Some items may interact with your medicine. What should I watch for while using this medication? Your condition will be monitored carefully while you are receiving this medication. You may need blood work while taking this medication. This medication may make you feel generally unwell. This is not uncommon as chemotherapy can affect healthy cells as well as cancer cells. Report any side effects. Continue your course of treatment even though you feel ill unless your care team tells you to stop. Other product types may be available that contain the   medication azacitidine. The injection and oral products should not be used in place of one another. Talk to your care team if you have questions. This medication can cause serious side effects. To reduce the risk, your care team may give you other medications to take before receiving this one. Be sure to follow the directions from your care team. This medication may increase your  risk of getting an infection. Call your care team for advice if you get a fever, chills, sore throat, or other symptoms of a cold or flu. Do not treat yourself. Try to avoid being around people who are sick. Avoid taking medications that contain aspirin, acetaminophen, ibuprofen, naproxen, or ketoprofen unless instructed by your care team. These medications may hide a fever. This medication may increase your risk to bruise or bleed. Call your care team if you notice any unusual bleeding. Be careful brushing or flossing your teeth or using a toothpick because you may get an infection or bleed more easily. If you have any dental work done, tell your dentist you are receiving this medication. Talk to your care team if you or your partner may be pregnant. Serious birth defects can occur if you take this medication during pregnancy and for 6 months after the last dose. You will need a negative pregnancy test before starting this medication. Contraception is recommended while taking his medication and for 6 months after the last dose. Your care team can help you find the option that works for you. If your partner can get pregnant, use a condom during sex while taking this medication and for 3 months after the last dose. Do not breastfeed while taking this medication and for 1 week after the last dose. This medication may cause infertility. Talk to your care team if you are concerned about your fertility. What side effects may I notice from receiving this medication? Side effects that you should report to your care team as soon as possible: Allergic reactions--skin rash, itching, hives, swelling of the face, lips, tongue, or throat Infection--fever, chills, cough, sore throat, wounds that don't heal, pain or trouble when passing urine, general feeling of discomfort or being unwell Kidney injury--decrease in the amount of urine, swelling of the ankles, hands, or feet Liver injury--right upper belly pain, loss  of appetite, nausea, light-colored stool, dark yellow or brown urine, yellowing skin or eyes, unusual weakness or fatigue Low red blood cell level--unusual weakness or fatigue, dizziness, headache, trouble breathing Tumor lysis syndrome (TLS)--nausea, vomiting, diarrhea, decrease in the amount of urine, dark urine, unusual weakness or fatigue, confusion, muscle pain or cramps, fast or irregular heartbeat, joint pain Unusual bruising or bleeding Side effects that usually do not require medical attention (report to your care team if they continue or are bothersome): Constipation Diarrhea Nausea Pain, redness, or irritation at injection site Vomiting This list may not describe all possible side effects. Call your doctor for medical advice about side effects. You may report side effects to FDA at 1-800-FDA-1088. Where should I keep my medication? This medication is given in a hospital or clinic. It will not be stored at home. NOTE: This sheet is a summary. It may not cover all possible information. If you have questions about this medicine, talk to your doctor, pharmacist, or health care provider.  2024 Elsevier/Gold Standard (2021-07-19 00:00:00)        To help prevent nausea and vomiting after your treatment, we encourage you to take your nausea medication as directed.  BELOW ARE SYMPTOMS   THAT SHOULD BE REPORTED IMMEDIATELY: *FEVER GREATER THAN 100.4 F (38 C) OR HIGHER *CHILLS OR SWEATING *NAUSEA AND VOMITING THAT IS NOT CONTROLLED WITH YOUR NAUSEA MEDICATION *UNUSUAL SHORTNESS OF BREATH *UNUSUAL BRUISING OR BLEEDING *URINARY PROBLEMS (pain or burning when urinating, or frequent urination) *BOWEL PROBLEMS (unusual diarrhea, constipation, pain near the anus) TENDERNESS IN MOUTH AND THROAT WITH OR WITHOUT PRESENCE OF ULCERS (sore throat, sores in mouth, or a toothache) UNUSUAL RASH, SWELLING OR PAIN  UNUSUAL VAGINAL DISCHARGE OR ITCHING   Items with * indicate a potential emergency and  should be followed up as soon as possible or go to the Emergency Department if any problems should occur.  Please show the CHEMOTHERAPY ALERT CARD or IMMUNOTHERAPY ALERT CARD at check-in to the Emergency Department and triage nurse.  Should you have questions after your visit or need to cancel or reschedule your appointment, please contact [facility] [phone]  and follow the prompts.  Office hours are 8:00 a.m. to 4:30 p.m. Monday - Friday. Please note that voicemails left after 4:00 p.m. may not be returned until the following business day.  We are closed weekends and major holidays. You have access to a nurse at all times for urgent questions. Please call the main number to the clinic [phone] and follow the prompts.  For any non-urgent questions, you may also contact your provider using MyChart. We now offer e-Visits for anyone 18 and older to request care online for non-urgent symptoms. For details visit [email or web address].   Also download the MyChart app! Go to the app store, search "MyChart", open the app, select [facility], and log in with your MyChart username and password.   

## 2022-11-26 NOTE — Progress Notes (Signed)
Patient presents today for chemotherapy infusion.  Patient is in satisfactory condition with no new complaints voiced.  Vital signs are stable.  Labs from 11/25/2022 reviewed by MD at [facility].  We will proceed with treatment per MD orders.    Patient tolerated treatment well with no complaints voiced.  Patient left ambulatory in stable condition.  Vital signs stable at discharge.  Follow up as scheduled.

## 2022-11-27 ENCOUNTER — Inpatient Hospital Stay: Payer: Medicare HMO

## 2022-11-27 VITALS — BP 131/84 | HR 56 | Temp 96.3°F | Resp 18

## 2022-11-27 DIAGNOSIS — Z95828 Presence of other vascular implants and grafts: Secondary | ICD-10-CM

## 2022-11-27 DIAGNOSIS — Z5111 Encounter for antineoplastic chemotherapy: Secondary | ICD-10-CM | POA: Diagnosis not present

## 2022-11-27 DIAGNOSIS — D469 Myelodysplastic syndrome, unspecified: Secondary | ICD-10-CM

## 2022-11-27 MED ORDER — SODIUM CHLORIDE 0.9% FLUSH
10.0000 mL | INTRAVENOUS | Status: DC | PRN
  Administered 2022-11-27: 10 mL

## 2022-11-27 MED ORDER — PALONOSETRON HCL INJECTION 0.25 MG/5ML
0.2500 mg | Freq: Once | INTRAVENOUS | Status: AC
  Administered 2022-11-27: 0.25 mg via INTRAVENOUS
  Filled 2022-11-27: qty 5

## 2022-11-27 MED ORDER — SODIUM CHLORIDE 0.9 % IV SOLN
75.0000 mg/m2 | Freq: Once | INTRAVENOUS | Status: AC
  Administered 2022-11-27: 140 mg via INTRAVENOUS
  Filled 2022-11-27: qty 14

## 2022-11-27 MED ORDER — SODIUM CHLORIDE 0.9 % IV SOLN: Freq: Once | INTRAVENOUS | Status: AC

## 2022-11-27 MED ORDER — HEPARIN SOD (PORK) LOCK FLUSH 100 UNIT/ML IV SOLN
500.0000 [IU] | Freq: Once | INTRAVENOUS | Status: AC | PRN
  Administered 2022-11-27: 500 [IU]

## 2022-11-27 MED ORDER — SODIUM CHLORIDE 0.9 % IV SOLN
10.0000 mg | Freq: Once | INTRAVENOUS | Status: AC
  Administered 2022-11-27: 10 mg via INTRAVENOUS
  Filled 2022-11-27: qty 10

## 2022-11-27 NOTE — Patient Instructions (Signed)
[facility]  Discharge Instructions: Thank you for choosing [facility] to provide your oncology and hematology care.  If you have a lab appointment with the Cancer Center - please note that after April 8th, 2024, all labs will be drawn in the cancer center.  You do not have to check in or register with the main entrance as you have in the past but will complete your check-in in the cancer center.  Wear comfortable clothing and clothing appropriate for easy access to any Portacath or PICC line.   We strive to give you quality time with your provider. You may need to reschedule your appointment if you arrive late (15 or more minutes).  Arriving late affects you and other patients whose appointments are after yours.  Also, if you miss three or more appointments without notifying the office, you may be dismissed from the clinic at the provider's discretion.      For prescription refill requests, have your pharmacy contact our office and allow 72 hours for refills to be completed.    Today you received the following chemotherapy and/or immunotherapy agents Vidaza   To help prevent nausea and vomiting after your treatment, we encourage you to take your nausea medication as directed.  BELOW ARE SYMPTOMS THAT SHOULD BE REPORTED IMMEDIATELY: *FEVER GREATER THAN 100.4 F (38 C) OR HIGHER *CHILLS OR SWEATING *NAUSEA AND VOMITING THAT IS NOT CONTROLLED WITH YOUR NAUSEA MEDICATION *UNUSUAL SHORTNESS OF BREATH *UNUSUAL BRUISING OR BLEEDING *URINARY PROBLEMS (pain or burning when urinating, or frequent urination) *BOWEL PROBLEMS (unusual diarrhea, constipation, pain near the anus) TENDERNESS IN MOUTH AND THROAT WITH OR WITHOUT PRESENCE OF ULCERS (sore throat, sores in mouth, or a toothache) UNUSUAL RASH, SWELLING OR PAIN  UNUSUAL VAGINAL DISCHARGE OR ITCHING   Items with * indicate a potential emergency and should be followed up as soon as possible or go to the  Emergency Department if any problems should occur.  Please show the CHEMOTHERAPY ALERT CARD or IMMUNOTHERAPY ALERT CARD at check-in to the Emergency Department and triage nurse.  Should you have questions after your visit or need to cancel or reschedule your appointment, please contact [facility] [phone]  and follow the prompts.  Office hours are 8:00 a.m. to 4:30 p.m. Monday - Friday. Please note that voicemails left after 4:00 p.m. may not be returned until the following business day.  We are closed weekends and major holidays. You have access to a nurse at all times for urgent questions. Please call the main number to the clinic [phone] and follow the prompts.  For any non-urgent questions, you may also contact your provider using MyChart. We now offer e-Visits for anyone 18 and older to request care online for non-urgent symptoms. For details visit [email or web address].   Also download the MyChart app! Go to the app store, search "MyChart", open the app, select [facility], and log in with your MyChart username and password.   

## 2022-11-27 NOTE — Progress Notes (Signed)
Treatment given per orders. Patient tolerated it well without problems. Vitals stable and discharged home from clinic ambulatory. Follow up as scheduled.  

## 2022-11-28 ENCOUNTER — Inpatient Hospital Stay: Payer: Medicare HMO

## 2022-11-28 VITALS — BP 151/85 | HR 65 | Temp 97.5°F | Resp 18

## 2022-11-28 DIAGNOSIS — Z5111 Encounter for antineoplastic chemotherapy: Secondary | ICD-10-CM | POA: Diagnosis not present

## 2022-11-28 DIAGNOSIS — D469 Myelodysplastic syndrome, unspecified: Secondary | ICD-10-CM

## 2022-11-28 DIAGNOSIS — Z95828 Presence of other vascular implants and grafts: Secondary | ICD-10-CM

## 2022-11-28 MED ORDER — SODIUM CHLORIDE 0.9% FLUSH
10.0000 mL | INTRAVENOUS | Status: DC | PRN
  Administered 2022-11-28: 10 mL

## 2022-11-28 MED ORDER — SODIUM CHLORIDE 0.9 % IV SOLN
10.0000 mg | Freq: Once | INTRAVENOUS | Status: AC
  Administered 2022-11-28: 10 mg via INTRAVENOUS
  Filled 2022-11-28: qty 10

## 2022-11-28 MED ORDER — HEPARIN SOD (PORK) LOCK FLUSH 100 UNIT/ML IV SOLN
500.0000 [IU] | Freq: Once | INTRAVENOUS | Status: AC | PRN
  Administered 2022-11-28: 500 [IU]

## 2022-11-28 MED ORDER — SODIUM CHLORIDE 0.9 % IV SOLN
75.0000 mg/m2 | Freq: Once | INTRAVENOUS | Status: AC
  Administered 2022-11-28: 140 mg via INTRAVENOUS
  Filled 2022-11-28: qty 14

## 2022-11-28 MED ORDER — SODIUM CHLORIDE 0.9 % IV SOLN: Freq: Once | INTRAVENOUS | Status: AC

## 2022-11-28 NOTE — Progress Notes (Signed)
Patient presents today for chemotherapy infusion.  Patient is in satisfactory condition with no new complaints voiced.  Vital signs are stable.  Labs from 11/25/2022 reviewed and all labs are within treatment parameters.  We will proceed with treatment per MD orders.    Patient tolerated treatment well with no complaints voiced.  Patient left ambulatory in stable condition.  Vital signs stable at discharge.  Follow up as scheduled.

## 2022-11-28 NOTE — Patient Instructions (Signed)
[facility] [place]  Discharge Instructions: Thank you for choosing [facility] to provide your oncology and hematology care.  If you have a lab appointment with the Cancer Center - please note that after April 8th, 2024, all labs will be drawn in the cancer center.  You do not have to check in or register with the main entrance as you have in the past but will complete your check-in in the cancer center.  Wear comfortable clothing and clothing appropriate for easy access to any Portacath or PICC line.   We strive to give you quality time with your provider. You may need to reschedule your appointment if you arrive late (15 or more minutes).  Arriving late affects you and other patients whose appointments are after yours.  Also, if you miss three or more appointments without notifying the office, you may be dismissed from the clinic at the provider's discretion.      For prescription refill requests, have your pharmacy contact our office and allow 72 hours for refills to be completed.    Today you received the following chemotherapy and/or immunotherapy agents Vidaza.  Azacitidine Injection What is this medication? AZACITIDINE (ay za SITE i deen) treats blood and bone marrow cancers. It works by slowing down the growth of cancer cells. This medicine may be used for other purposes; ask your health care provider or pharmacist if you have questions. COMMON BRAND NAME(S): Vidaza What should I tell my care team before I take this medication? They need to know if you have any of these conditions: Kidney disease Liver disease Low blood cell levels, such as low white cells, platelets, or red blood cells Low levels of albumin in the blood Low levels of bicarbonate in the blood An unusual or allergic reaction to azacitidine, mannitol, other medications, foods, dyes, or preservatives If you or your partner are pregnant or trying to get pregnant Breast-feeding How should I  use this medication? This medication is injected into a vein or under the skin. It is given by your care team in a hospital or clinic setting. Talk to your care team about the use of this medication in children. While it may be prescribed for children as young as 1 month for selected conditions, precautions do apply. Overdosage: If you think you have taken too much of this medicine contact a poison control center or emergency room at once. NOTE: This medicine is only for you. Do not share this medicine with others. What if I miss a dose? Keep appointments for follow-up doses. It is important not to miss your dose. Call your care team if you are unable to keep an appointment. What may interact with this medication? Interactions are not expected. This list may not describe all possible interactions. Give your health care provider a list of all the medicines, herbs, non-prescription drugs, or dietary supplements you use. Also tell them if you smoke, drink alcohol, or use illegal drugs. Some items may interact with your medicine. What should I watch for while using this medication? Your condition will be monitored carefully while you are receiving this medication. You may need blood work while taking this medication. This medication may make you feel generally unwell. This is not uncommon as chemotherapy can affect healthy cells as well as cancer cells. Report any side effects. Continue your course of treatment even though you feel ill unless your care team tells you to stop. Other product types may be available that contain the   medication azacitidine. The injection and oral products should not be used in place of one another. Talk to your care team if you have questions. This medication can cause serious side effects. To reduce the risk, your care team may give you other medications to take before receiving this one. Be sure to follow the directions from your care team. This medication may increase your  risk of getting an infection. Call your care team for advice if you get a fever, chills, sore throat, or other symptoms of a cold or flu. Do not treat yourself. Try to avoid being around people who are sick. Avoid taking medications that contain aspirin, acetaminophen, ibuprofen, naproxen, or ketoprofen unless instructed by your care team. These medications may hide a fever. This medication may increase your risk to bruise or bleed. Call your care team if you notice any unusual bleeding. Be careful brushing or flossing your teeth or using a toothpick because you may get an infection or bleed more easily. If you have any dental work done, tell your dentist you are receiving this medication. Talk to your care team if you or your partner may be pregnant. Serious birth defects can occur if you take this medication during pregnancy and for 6 months after the last dose. You will need a negative pregnancy test before starting this medication. Contraception is recommended while taking his medication and for 6 months after the last dose. Your care team can help you find the option that works for you. If your partner can get pregnant, use a condom during sex while taking this medication and for 3 months after the last dose. Do not breastfeed while taking this medication and for 1 week after the last dose. This medication may cause infertility. Talk to your care team if you are concerned about your fertility. What side effects may I notice from receiving this medication? Side effects that you should report to your care team as soon as possible: Allergic reactions--skin rash, itching, hives, swelling of the face, lips, tongue, or throat Infection--fever, chills, cough, sore throat, wounds that don't heal, pain or trouble when passing urine, general feeling of discomfort or being unwell Kidney injury--decrease in the amount of urine, swelling of the ankles, hands, or feet Liver injury--right upper belly pain, loss  of appetite, nausea, light-colored stool, dark yellow or brown urine, yellowing skin or eyes, unusual weakness or fatigue Low red blood cell level--unusual weakness or fatigue, dizziness, headache, trouble breathing Tumor lysis syndrome (TLS)--nausea, vomiting, diarrhea, decrease in the amount of urine, dark urine, unusual weakness or fatigue, confusion, muscle pain or cramps, fast or irregular heartbeat, joint pain Unusual bruising or bleeding Side effects that usually do not require medical attention (report to your care team if they continue or are bothersome): Constipation Diarrhea Nausea Pain, redness, or irritation at injection site Vomiting This list may not describe all possible side effects. Call your doctor for medical advice about side effects. You may report side effects to FDA at 1-800-FDA-1088. Where should I keep my medication? This medication is given in a hospital or clinic. It will not be stored at home. NOTE: This sheet is a summary. It may not cover all possible information. If you have questions about this medicine, talk to your doctor, pharmacist, or health care provider.  2024 Elsevier/Gold Standard (2021-07-19 00:00:00)        To help prevent nausea and vomiting after your treatment, we encourage you to take your nausea medication as directed.  BELOW ARE SYMPTOMS   THAT SHOULD BE REPORTED IMMEDIATELY: *FEVER GREATER THAN 100.4 F (38 C) OR HIGHER *CHILLS OR SWEATING *NAUSEA AND VOMITING THAT IS NOT CONTROLLED WITH YOUR NAUSEA MEDICATION *UNUSUAL SHORTNESS OF BREATH *UNUSUAL BRUISING OR BLEEDING *URINARY PROBLEMS (pain or burning when urinating, or frequent urination) *BOWEL PROBLEMS (unusual diarrhea, constipation, pain near the anus) TENDERNESS IN MOUTH AND THROAT WITH OR WITHOUT PRESENCE OF ULCERS (sore throat, sores in mouth, or a toothache) UNUSUAL RASH, SWELLING OR PAIN  UNUSUAL VAGINAL DISCHARGE OR ITCHING   Items with * indicate a potential emergency and  should be followed up as soon as possible or go to the Emergency Department if any problems should occur.  Please show the CHEMOTHERAPY ALERT CARD or IMMUNOTHERAPY ALERT CARD at check-in to the Emergency Department and triage nurse.  Should you have questions after your visit or need to cancel or reschedule your appointment, please contact [facility] [phone]  and follow the prompts.  Office hours are 8:00 a.m. to 4:30 p.m. Monday - Friday. Please note that voicemails left after 4:00 p.m. may not be returned until the following business day.  We are closed weekends and major holidays. You have access to a nurse at all times for urgent questions. Please call the main number to the clinic [phone] and follow the prompts.  For any non-urgent questions, you may also contact your provider using MyChart. We now offer e-Visits for anyone 18 and older to request care online for non-urgent symptoms. For details visit [email or web address].   Also download the MyChart app! Go to the app store, search "MyChart", open the app, select [facility], and log in with your MyChart username and password.   

## 2022-11-29 ENCOUNTER — Inpatient Hospital Stay: Payer: Medicare HMO

## 2022-11-29 VITALS — BP 144/90 | HR 63 | Temp 97.7°F | Resp 18

## 2022-11-29 DIAGNOSIS — Z95828 Presence of other vascular implants and grafts: Secondary | ICD-10-CM

## 2022-11-29 DIAGNOSIS — D469 Myelodysplastic syndrome, unspecified: Secondary | ICD-10-CM

## 2022-11-29 DIAGNOSIS — Z5111 Encounter for antineoplastic chemotherapy: Secondary | ICD-10-CM | POA: Diagnosis not present

## 2022-11-29 MED ORDER — PALONOSETRON HCL INJECTION 0.25 MG/5ML
0.2500 mg | Freq: Once | INTRAVENOUS | Status: AC
  Administered 2022-11-29: 0.25 mg via INTRAVENOUS
  Filled 2022-11-29: qty 5

## 2022-11-29 MED ORDER — SODIUM CHLORIDE 0.9% FLUSH
10.0000 mL | INTRAVENOUS | Status: DC | PRN
  Administered 2022-11-29: 10 mL

## 2022-11-29 MED ORDER — SODIUM CHLORIDE 0.9 % IV SOLN
75.0000 mg/m2 | Freq: Once | INTRAVENOUS | Status: AC
  Administered 2022-11-29: 140 mg via INTRAVENOUS
  Filled 2022-11-29: qty 14

## 2022-11-29 MED ORDER — SODIUM CHLORIDE 0.9 % IV SOLN
10.0000 mg | Freq: Once | INTRAVENOUS | Status: AC
  Administered 2022-11-29: 10 mg via INTRAVENOUS
  Filled 2022-11-29: qty 10

## 2022-11-29 MED ORDER — SODIUM CHLORIDE 0.9 % IV SOLN: Freq: Once | INTRAVENOUS | Status: AC

## 2022-11-29 MED ORDER — HEPARIN SOD (PORK) LOCK FLUSH 100 UNIT/ML IV SOLN
500.0000 [IU] | Freq: Once | INTRAVENOUS | Status: AC | PRN
  Administered 2022-11-29: 500 [IU]

## 2022-11-29 NOTE — Patient Instructions (Signed)
[facility]  Discharge Instructions: Thank you for choosing [facility] to provide your oncology and hematology care.  If you have a lab appointment with the Cancer Center - please note that after April 8th, 2024, all labs will be drawn in the cancer center.  You do not have to check in or register with the main entrance as you have in the past but will complete your check-in in the cancer center.  Wear comfortable clothing and clothing appropriate for easy access to any Portacath or PICC line.  Azacitidine Injection What is this medication? AZACITIDINE (ay za SITE i deen) treats blood and bone marrow cancers. It works by slowing down the growth of cancer cells. This medicine may be used for other purposes; ask your health care provider or pharmacist if you have questions. COMMON BRAND NAME(S): Vidaza What should I tell my care team before I take this medication? They need to know if you have any of these conditions: Kidney disease Liver disease Low blood cell levels, such as low white cells, platelets, or red blood cells Low levels of albumin in the blood Low levels of bicarbonate in the blood An unusual or allergic reaction to azacitidine, mannitol, other medications, foods, dyes, or preservatives If you or your partner are pregnant or trying to get pregnant Breast-feeding How should I use this medication? This medication is injected into a vein or under the skin. It is given by your care team in a hospital or clinic setting. Talk to your care team about the use of this medication in children. While it may be prescribed for children as young as 1 month for selected conditions, precautions do apply. Overdosage: If you think you have taken too much of this medicine contact a poison control center or emergency room at once. NOTE: This medicine is only for you. Do not share this medicine with others. What if I miss a dose? Keep appointments for follow-up  doses. It is important not to miss your dose. Call your care team if you are unable to keep an appointment. What may interact with this medication? Interactions are not expected. This list may not describe all possible interactions. Give your health care provider a list of all the medicines, herbs, non-prescription drugs, or dietary supplements you use. Also tell them if you smoke, drink alcohol, or use illegal drugs. Some items may interact with your medicine. What should I watch for while using this medication? Your condition will be monitored carefully while you are receiving this medication. You may need blood work while taking this medication. This medication may make you feel generally unwell. This is not uncommon as chemotherapy can affect healthy cells as well as cancer cells. Report any side effects. Continue your course of treatment even though you feel ill unless your care team tells you to stop. Other product types may be available that contain the medication azacitidine. The injection and oral products should not be used in place of one another. Talk to your care team if you have questions. This medication can cause serious side effects. To reduce the risk, your care team may give you other medications to take before receiving this one. Be sure to follow the directions from your care team. This medication may increase your risk of getting an infection. Call your care team for advice if you get a fever, chills, sore throat, or other symptoms of a cold or flu. Do not treat yourself. Try to avoid being around  people who are sick. Avoid taking medications that contain aspirin, acetaminophen, ibuprofen, naproxen, or ketoprofen unless instructed by your care team. These medications may hide a fever. This medication may increase your risk to bruise or bleed. Call your care team if you notice any unusual bleeding. Be careful brushing or flossing your teeth or using a toothpick because you may get an  infection or bleed more easily. If you have any dental work done, tell your dentist you are receiving this medication. Talk to your care team if you or your partner may be pregnant. Serious birth defects can occur if you take this medication during pregnancy and for 6 months after the last dose. You will need a negative pregnancy test before starting this medication. Contraception is recommended while taking his medication and for 6 months after the last dose. Your care team can help you find the option that works for you. If your partner can get pregnant, use a condom during sex while taking this medication and for 3 months after the last dose. Do not breastfeed while taking this medication and for 1 week after the last dose. This medication may cause infertility. Talk to your care team if you are concerned about your fertility. What side effects may I notice from receiving this medication? Side effects that you should report to your care team as soon as possible: Allergic reactions--skin rash, itching, hives, swelling of the face, lips, tongue, or throat Infection--fever, chills, cough, sore throat, wounds that don't heal, pain or trouble when passing urine, general feeling of discomfort or being unwell Kidney injury--decrease in the amount of urine, swelling of the ankles, hands, or feet Liver injury--right upper belly pain, loss of appetite, nausea, light-colored stool, dark yellow or brown urine, yellowing skin or eyes, unusual weakness or fatigue Low red blood cell level--unusual weakness or fatigue, dizziness, headache, trouble breathing Tumor lysis syndrome (TLS)--nausea, vomiting, diarrhea, decrease in the amount of urine, dark urine, unusual weakness or fatigue, confusion, muscle pain or cramps, fast or irregular heartbeat, joint pain Unusual bruising or bleeding Side effects that usually do not require medical attention (report to your care team if they continue or are  bothersome): Constipation Diarrhea Nausea Pain, redness, or irritation at injection site Vomiting This list may not describe all possible side effects. Call your doctor for medical advice about side effects. You may report side effects to FDA at 1-800-FDA-1088. Where should I keep my medication? This medication is given in a hospital or clinic. It will not be stored at home. NOTE: This sheet is a summary. It may not cover all possible information. If you have questions about this medicine, talk to your doctor, pharmacist, or health care provider.  2024 Elsevier/Gold Standard (2021-07-19 00:00:00)  We strive to give you quality time with your provider. You may need to reschedule your appointment if you arrive late (15 or more minutes).  Arriving late affects you and other patients whose appointments are after yours.  Also, if you miss three or more appointments without notifying the office, you may be dismissed from the clinic at the provider's discretion.      For prescription refill requests, have your pharmacy contact our office and allow 72 hours for refills to be completed.    Today you received the following chemotherapy and/or immunotherapy agents D5 Vidaza   To help prevent nausea and vomiting after your treatment, we encourage you to take your nausea medication as directed.    BELOW ARE SYMPTOMS THAT SHOULD BE  REPORTED IMMEDIATELY: *FEVER GREATER THAN 100.4 F (38 C) OR HIGHER *CHILLS OR SWEATING *NAUSEA AND VOMITING THAT IS NOT CONTROLLED WITH YOUR NAUSEA MEDICATION *UNUSUAL SHORTNESS OF BREATH *UNUSUAL BRUISING OR BLEEDING *URINARY PROBLEMS (pain or burning when urinating, or frequent urination) *BOWEL PROBLEMS (unusual diarrhea, constipation, pain near the anus) TENDERNESS IN MOUTH AND THROAT WITH OR WITHOUT PRESENCE OF ULCERS (sore throat, sores in mouth, or a toothache) UNUSUAL RASH, SWELLING OR PAIN  UNUSUAL VAGINAL DISCHARGE OR ITCHING   Items with * indicate a  potential emergency and should be followed up as soon as possible or go to the Emergency Department if any problems should occur.  Please show the CHEMOTHERAPY ALERT CARD or IMMUNOTHERAPY ALERT CARD at check-in to the Emergency Department and triage nurse.  Should you have questions after your visit or need to cancel or reschedule your appointment, please contact [facility] [phone]  and follow the prompts.  Office hours are 8:00 a.m. to 4:30 p.m. Monday - Friday. Please note that voicemails left after 4:00 p.m. may not be returned until the following business day.  We are closed weekends and major holidays. You have access to a nurse at all times for urgent questions. Please call the main number to the clinic [phone] and follow the prompts.  For any non-urgent questions, you may also contact your provider using MyChart. We now offer e-Visits for anyone 75 and older to request care online for non-urgent symptoms. For details visit [email or web address].   Also download the MyChart app! Go to the app store, search "MyChart", open the app, select [facility], and log in with your MyChart username and password.

## 2022-11-29 NOTE — Progress Notes (Signed)
Patient presents today for D5 Vidaza per provider's order. Vital signs stable and pt voiced no new complaints at this time.  Treatment given today per MD orders. Tolerated infusion without adverse affects. Vital signs stable. No complaints at this time. Discharged from clinic ambulatory in stable condition. Alert and oriented x 3. F/U with [facility] as scheduled.

## 2022-12-03 ENCOUNTER — Other Ambulatory Visit (HOSPITAL_COMMUNITY): Payer: Self-pay

## 2022-12-21 ENCOUNTER — Other Ambulatory Visit: Payer: Self-pay

## 2022-12-24 ENCOUNTER — Other Ambulatory Visit: Payer: Self-pay | Admitting: *Deleted

## 2022-12-24 ENCOUNTER — Other Ambulatory Visit: Payer: Self-pay | Admitting: Hematology

## 2022-12-24 ENCOUNTER — Other Ambulatory Visit: Payer: Self-pay

## 2022-12-24 DIAGNOSIS — D469 Myelodysplastic syndrome, unspecified: Secondary | ICD-10-CM

## 2022-12-24 MED ORDER — VENETOCLAX 100 MG PO TABS
200.0000 mg | ORAL_TABLET | Freq: Every day | ORAL | 0 refills | Status: DC
  Filled 2022-12-24: qty 28, 28d supply, fill #0

## 2022-12-24 NOTE — Progress Notes (Signed)
Specialty Pharmacy Refill Coordination Note  Chad Lamb is a 77 y.o. male contacted today regarding refills of specialty medication(s) Venetoclax   Patient requested Delivery   Delivery date: 01/02/23   Verified address: [address] [place], [address]   Medication will be filled on 01/01/2023.

## 2022-12-24 NOTE — Telephone Encounter (Signed)
Venetoclax refill approved.  Patient is tolerating and is to continue therapy.

## 2022-12-25 ENCOUNTER — Other Ambulatory Visit: Payer: Self-pay

## 2022-12-25 NOTE — Progress Notes (Signed)
Refill received

## 2023-01-05 NOTE — Progress Notes (Signed)
[facility] [address] [place] [address]    Clinic Day:  01/06/2023  Referring physician: Aliene Beams, MD  Patient Care Team: Aliene Beams, MD as PCP - General (Family Medicine) Aliene Beams, MD (Family Medicine) [place] Bali, MD (Inactive) as Consulting Physician (Gastroenterology) Doreatha Massed, MD as Medical Oncologist (Hematology)   ASSESSMENT & PLAN:   Assessment: 1. MDS with EB 2: - Seen at the request of Dr. Tracie Harrier for cytopenias - BMBX on 06/04/2021: Hypercellular marrow with dyspoietic changes involving the granulocytic cell line and megakaryocytes.  Myeloblasts 8% on aspirate smears and 10% by flow.  Cytogenetics 75, XY (20).  MDS FISH panel normal. 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 - IPSS-M score of 0.12, moderate high risk.  Leukemia free survival is 2.3 years.  Overall survival 2.8 years. - Cycle 1 of azacitidine on 07/30/2021.  BMBX (01/04/2022): Overall improvement in blast count and cellularity.  Chromosome analysis and FISH panel normal.  Venetoclax 200 mg day 1 through 14 added with cycle 8 on 02/18/2022. - BMBX (06/05/2022): Slightly hypercellular for age with very mild dyspoietic changes at best associated with increased number of eosinophilic cells.  No increase in blast cells identified. - Azacitidine 5 days and venetoclax 200 mg days 1-14 every 6 weeks on 07/22/2022   2. Social/family history: - He lives at home with his wife.  He swims about 40 minutes daily. 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  He also worked as a Geologist, engineering.  Non-smoker. - Paternal aunt had breast cancer and mother had pancreatic cancer.  3.  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 - He underwent thrombolytic therapy at [facility]. - He will followed up with vascular at [facility] and has been on Xarelto since then.    Plan: 1. MDS with EB 2: - Cycle 15 of azacitidine  and venetoclax on 11/25/2022. - Denied any fevers or infections in the last 6 weeks. - He is able to swim about 50 feet without any problem. - Reviewed labs today: CBC was normal with hemoglobin 11.6.  LFTs and creatinine normal. - Proceed with cycle 16 today with azacitidine days 1-5 and venetoclax 200 mg on days 1-14.  RTC 6 weeks for follow-up.   2. Unprovoked left subclavian DVT: - Xarelto discontinued due to hemorrhoidal bleeding.  No evidence of recurrence.   3. Constipation: - Continue Colace and MiraLAX.  This is well-controlled.    No orders of the defined types were placed in this encounter.     I,Katie Daubenspeck,acting as a Neurosurgeon for Doreatha Massed, MD.,have documented all relevant documentation on the behalf of Doreatha Massed, MD,as directed by  Doreatha Massed, MD while in the presence of Doreatha Massed, MD.   I, Doreatha Massed MD, have reviewed the above documentation for accuracy and completeness, and I agree with the above.   Doreatha Massed, MD   10/21/20245:13 PM  CHIEF COMPLAINT:   Diagnosis: MDS with EB 2    Cancer Staging  No matching staging information was found for the patient.    Prior Therapy: none  Current Therapy:  Azacitidine and venetoclax    HISTORY OF PRESENT ILLNESS:   Oncology History  Myelodysplastic syndrome (HCC)  06/17/2021 Initial Diagnosis   Myelodysplastic syndrome (HCC)   07/30/2021 - 10/30/2021 Chemotherapy   Patient is on Treatment Plan : MYELODYSPLASIA  Azacitidine IV D1-7 q28d  07/30/2021 -  Chemotherapy   Patient is on Treatment Plan : MYELODYSPLASIA  Azacitidine IV D1-5 q42d        INTERVAL HISTORY:   Chad Lamb is a 77 y.o. male presenting to clinic today for follow up of MDS with EB 2. He was last seen by me on 11/25/22.  Today, he states that he is doing well overall. His appetite level is at 100%. His energy level is at 75%.  PAST MEDICAL HISTORY:   Past Medical History: Past  Medical History:  Diagnosis Date   Allergy    Anxiety    Arthritis    Asthma    Cataract    Clotting disorder (HCC)    DVT (deep venous thrombosis) (HCC)    unknown etiology. [facility]   GERD (gastroesophageal reflux disease)    History of dental surgery    feb 2023   Hyperlipidemia    Hypertension    MDS (myelodysplastic syndrome), high grade (HCC) 06/17/2021   Pneumonia    Port-A-Cath in place 07/30/2021    Surgical History: Past Surgical History:  Procedure Laterality Date   CATARACT EXTRACTION W/PHACO Right 11/24/2017   Procedure: CATARACT EXTRACTION PHACO AND INTRAOCULAR LENS PLACEMENT RIGHT EYE;  Surgeon: Gemma Payor, MD;  Location: [facility];  Service: Ophthalmology;  Laterality: Right;  CDE: 8.61   CATARACT EXTRACTION W/PHACO Left 12/29/2017   Procedure: CATARACT EXTRACTION PHACO AND INTRAOCULAR LENS PLACEMENT (IOC);  Surgeon: Gemma Payor, MD;  Location: [facility];  Service: Ophthalmology;  Laterality: Left;  CDE: 7.21   GANGLION CYST EXCISION     HERNIA REPAIR     bilateral   IR BONE MARROW BIOPSY & ASPIRATION  06/05/2022   PARS PLANA VITRECTOMY Left 05/17/2021   Procedure: PARS PLANA VITRECTOMY 25 GAUGE WITH ANTIBIOTIC INJECTION  AND ENDOLASER LEFT EYE;  Surgeon: Carmela Rima, MD;  [facility];  Service: Ophthalmology;  Laterality: Left;   PORTACATH PLACEMENT Right 07/09/2021   Procedure: INSERTION PORT-A-CATH;  Surgeon: Franky Macho, MD;  Location: [facility];  Service: General;  Laterality: Right;   TONSILLECTOMY     VASCULAR SURGERY      Social History: Social History   Socioeconomic History   Marital status: Married    Spouse name: Harriett Sine   Number of children: 0   Years of education: 13   Highest education level: High school graduate  Occupational History   Not on file  Tobacco Use   Smoking status: Former    Types: Pipe    Quit date: 03/21/1966    Years since quitting: 56.8   Smokeless tobacco: Never  Vaping Use   Vaping status: Never Used  Substance and  Sexual Activity   Alcohol use: Yes    Alcohol/week: 1.0 standard drink of alcohol    Types: 1 Cans of beer per week    Comment: 1 can of beer a week   Drug use: Never   Sexual activity: Not Currently  Other Topics Concern   Not on file  Social History Narrative   ** Merged History Encounter **       Grew up in [place], [place] and [place].  Retired.  Worked in [place]. 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    Social Determinants of Health   Financial Resource Strain: Low Risk  (01/23/2017)   Overall Financial Resource Strain (CARDIA)    Difficulty of Paying Living Expenses: Not hard at all  Food Insecurity: No Food Insecurity (01/23/2017)  Hunger Vital Sign    Worried About Running Out of Food in the Last Year: Never true    Ran Out of Food in the Last Year: Never true  Transportation Needs: No Transportation Needs (01/23/2017)   PRAPARE - Administrator, Civil Service (Medical): No    Lack of Transportation (Non-Medical): No  Physical Activity: Unknown (01/23/2017)   Exercise Vital Sign    Days of Exercise per Week: 3 days    Minutes of Exercise per Session: Not on file  Stress: Stress Concern Present (01/23/2017)   [employer] of Occupational Health - Occupational Stress Questionnaire    Feeling of Stress : To some extent  Social Connections: Somewhat Isolated (01/23/2017)   Social Connection and Isolation Panel [NHANES]    Frequency of Communication with Friends and Family: More than three times a week    Frequency of Social Gatherings with Friends and Family: More than three times a week    Attends Religious Services: Never    Database administrator or Organizations: No    Attends Banker Meetings: Never    Marital Status: Married  Catering manager Violence: Not At Risk (01/23/2017)   Humiliation, Afraid, Rape, and Kick questionnaire    Fear of Current or Ex-Partner: No    Emotionally Abused: No     Physically Abused: No    Sexually Abused: No    Family History: Family History  Problem Relation Age of Onset   Cancer Mother        pancreatic   Arthritis Father    Early death Father 22       Lung infiltrate   Colon cancer Neg Hx    Colon polyps Neg Hx     Current Medications:  Current Outpatient Medications:    albuterol (PROVENTIL HFA;VENTOLIN HFA) 108 (90 Base) MCG/ACT inhaler, Inhale 1-2 puffs into the lungs every 6 (six) hours as needed for wheezing or shortness of breath., Disp: , Rfl:    ALPRAZolam (XANAX) 0.5 MG tablet, Take 0.5 mg by mouth 2 (two) times daily as needed for anxiety., Disp: , Rfl:    amLODipine (NORVASC) 2.5 MG tablet, Take 2.5 mg by mouth daily., Disp: , Rfl:    azaCITIDine 5 mg/2 mLs in lactated ringers infusion, Inject into the vein daily. Days 1-7 every 28 days, Disp: , Rfl:    DENTA 5000 PLUS 1.1 % CREA dental cream, Take by mouth., Disp: , Rfl:    fluticasone furoate-vilanterol (BREO ELLIPTA) 200-25 MCG/ACT AEPB, Inhale 1 puff into the lungs daily., Disp: , Rfl:    Lactulose 20 GM/30ML SOLN, Take 30 mLs (20 g total) by mouth at bedtime., Disp: 473 mL, Rfl: 3   Lidocaine-Hydrocort, Perianal, 3-0.5 % CREA, Apply topically 2 (two) times daily as needed., Disp: , Rfl:    Lidocaine-Hydrocortisone Ace 3-0.5 % CREA, Apply 1 application topically as directed. (Patient taking differently: Apply 1 application  topically 2 (two) times daily as needed (hemorrhoids).), Disp: 30 Tube, Rfl: 11   lidocaine-prilocaine (EMLA) cream, Apply a SMALL AMOUNT TO port a cath site (DO not RUB in) AND cover with PLASTIC WRAP ONE hour prior TO infusion appointment, Disp: 30 g, Rfl: 3   loratadine (CLARITIN) 10 MG tablet, Take 10 mg by mouth daily., Disp: , Rfl:    Lutein 40 MG CAPS, Take 40 mg by mouth daily., Disp: , Rfl:    Multiple Vitamins-Minerals (CENTRUM ADULTS PO), Take 1 tablet by mouth daily., Disp: , Rfl:  nystatin cream (MYCOSTATIN), Apply 1 application.  topically 2 (two) times daily as needed for dry skin., Disp: , Rfl:    ofloxacin (OCUFLOX) 0.3 % ophthalmic solution, Place 1 drop into the left eye See admin instructions. 1 drop to left eye 4 x daily for 7 days after monthly procedure., Disp: , Rfl:    omeprazole (PRILOSEC OTC) 20 MG tablet, Take 5-10 mg by mouth daily., Disp: , Rfl:    sildenafil (VIAGRA) 50 MG tablet, Take 50 mg by mouth daily as needed for erectile dysfunction., Disp: , Rfl:    traMADol (ULTRAM) 50 MG tablet, Take 1 tablet (50 mg total) by mouth every 6 (six) hours as needed., Disp: 15 tablet, Rfl: 0   venetoclax (VENCLEXTA) 100 MG tablet, Take 2 tablets (200 mg total) by mouth daily. Take for 14 days, then hold for 14 days. Repeat every 28 days. Take with a meal and a full glass of water., Disp: 28 tablet, Rfl: 0 No current facility-administered medications for this visit.  Facility-Administered Medications Ordered in Other Visits:    sodium chloride flush (NS) 0.9 % injection 10 mL, 10 mL, Intracatheter, PRN, Doreatha Massed, MD, 10 mL at 06/12/22 1606   sodium chloride flush (NS) 0.9 % injection 10 mL, 10 mL, Intracatheter, PRN, Doreatha Massed, MD, 10 mL at 01/06/23 1310   Allergies: Allergies  Allergen Reactions   Cephalosporins Itching   Amoxapine And Related Itching and Rash    Has patient had a PCN reaction causing immediate rash, facial/tongue/throat swelling, SOB or lightheadedness with hypotension: No Has patient had a PCN reaction causing severe rash involving mucus membranes or skin necrosis: No Has patient had a PCN reaction that required hospitalization: No Has patient had a PCN reaction occurring within the last 10 years: No If all of the above answers are "NO", then may proceed with Cephalosporin use.     Amoxicillin Rash   Cephalexin Rash   Clindamycin/Lincomycin Itching and Rash   Sulfamethoxazole Itching and Rash   Trimethoprim Rash    REVIEW OF SYSTEMS:   Review of Systems   Constitutional:  Negative for chills, fatigue and fever.  HENT:   Negative for lump/mass, mouth sores, nosebleeds, sore throat and trouble swallowing.   Eyes:  Negative for eye problems.  Respiratory:  Positive for shortness of breath. Negative for cough.   Cardiovascular:  Negative for chest pain, leg swelling and palpitations.  Gastrointestinal:  Negative for abdominal pain, constipation, diarrhea, nausea and vomiting.  Genitourinary:  Negative for bladder incontinence, difficulty urinating, dysuria, frequency, hematuria and nocturia.   Musculoskeletal:  Negative for arthralgias, back pain, flank pain, myalgias and neck pain.  Skin:  Negative for itching and rash.  Neurological:  Negative for dizziness, headaches and numbness.  Hematological:  Does not bruise/bleed easily.  Psychiatric/Behavioral:  Positive for depression. Negative for sleep disturbance and suicidal ideas. The patient is nervous/anxious.   All other systems reviewed and are negative.    VITALS:   There were no vitals taken for this visit.  Wt Readings from Last 3 Encounters:  01/06/23 156 lb 3.2 oz (70.9 kg)  11/25/22 155 lb 6.4 oz (70.5 kg)  10/14/22 154 lb 1.6 oz (69.9 kg)    There is no height or weight on file to calculate BMI.  Performance status (ECOG): 1 - Symptomatic but completely ambulatory  PHYSICAL EXAM:   Physical Exam Vitals and nursing note reviewed. Exam conducted with a chaperone present.  Constitutional:      Appearance: Normal  appearance.  Cardiovascular:     Rate and Rhythm: Normal rate and regular rhythm.     Pulses: Normal pulses.     Heart sounds: Normal heart sounds.  Pulmonary:     Effort: Pulmonary effort is normal.     Breath sounds: Normal breath sounds.  Abdominal:     Palpations: Abdomen is soft. There is no hepatomegaly, splenomegaly or mass.     Tenderness: There is no abdominal tenderness.  Musculoskeletal:     Right lower leg: No edema.     Left lower leg: No edema.   Lymphadenopathy:     Cervical: No cervical adenopathy.     Right cervical: No superficial, deep or posterior cervical adenopathy.    Left cervical: No superficial, deep or posterior cervical adenopathy.     Upper Body:     Right upper body: No supraclavicular or axillary adenopathy.     Left upper body: No supraclavicular or axillary adenopathy.  Neurological:     General: No focal deficit present.     Mental Status: He is alert and oriented to person, place, and time.  Psychiatric:        Mood and Affect: Mood normal.        Behavior: Behavior normal.     LABS:      Latest Ref Rng & Units 01/06/2023    9:06 AM 11/25/2022    9:59 AM 10/14/2022    1:08 PM  CBC  WBC 4.0 - 10.5 K/uL 6.2  6.4  5.8   Hemoglobin 13.0 - 17.0 g/dL 84.6  96.2  95.2   Hematocrit 39.0 - 52.0 % 36.1  37.4  36.7   Platelets 150 - 400 K/uL 225  218  209       Latest Ref Rng & Units 01/06/2023    9:06 AM 11/25/2022    9:59 AM 10/14/2022    1:08 PM  CMP  Glucose 70 - 99 mg/dL 841  324  401   BUN 8 - 23 mg/dL 11  12  14    Creatinine 0.61 - 1.24 mg/dL 0.27  2.53  6.64   Sodium 135 - 145 mmol/L 138  136  139   Potassium 3.5 - 5.1 mmol/L 3.5  3.6  3.6   Chloride 98 - 111 mmol/L 105  104  108   CO2 22 - 32 mmol/L 26  25  26    Calcium 8.9 - 10.3 mg/dL 9.3  9.1  9.3   Total Protein 6.5 - 8.1 g/dL 6.1  6.0  6.1   Total Bilirubin 0.3 - 1.2 mg/dL 1.2  1.2  1.5   Alkaline Phos 38 - 126 U/L 61  65  64   AST 15 - 41 U/L 20  21  19    ALT 0 - 44 U/L 16  16  17       No results found for: "CEA1", "CEA" / No results found for: "CEA1", "CEA" No results found for: "PSA1" No results found for: "CAN199" No results found for: "CAN125"  Lab Results  Component Value Date   TOTALPROTELP 6.3 01/05/2021   ALBUMINELP 3.3 01/05/2021   A1GS 0.3 01/05/2021   A2GS 0.8 01/05/2021   BETS 1.0 01/05/2021   GAMS 1.0 01/05/2021   MSPIKE Not Observed 01/05/2021   SPEI Comment 01/05/2021   No results found for: "TIBC",  "FERRITIN", "IRONPCTSAT" Lab Results  Component Value Date   LDH 147 11/20/2021   LDH 143 10/22/2021   LDH 144 10/16/2021  STUDIES:   No results found.

## 2023-01-06 ENCOUNTER — Inpatient Hospital Stay: Payer: Medicare HMO

## 2023-01-06 ENCOUNTER — Inpatient Hospital Stay: Payer: Medicare HMO | Attending: Hematology | Admitting: Hematology

## 2023-01-06 VITALS — BP 133/85 | HR 63 | Temp 96.6°F | Resp 18

## 2023-01-06 DIAGNOSIS — D469 Myelodysplastic syndrome, unspecified: Secondary | ICD-10-CM

## 2023-01-06 DIAGNOSIS — Z95828 Presence of other vascular implants and grafts: Secondary | ICD-10-CM | POA: Diagnosis not present

## 2023-01-06 DIAGNOSIS — D4622 Refractory anemia with excess of blasts 2: Secondary | ICD-10-CM | POA: Insufficient documentation

## 2023-01-06 LAB — COMPREHENSIVE METABOLIC PANEL
ALT: 16 U/L (ref 0–44)
AST: 20 U/L (ref 15–41)
Albumin: 3.9 g/dL (ref 3.5–5.0)
Alkaline Phosphatase: 61 U/L (ref 38–126)
Anion gap: 7 (ref 5–15)
BUN: 11 mg/dL (ref 8–23)
CO2: 26 mmol/L (ref 22–32)
Calcium: 9.3 mg/dL (ref 8.9–10.3)
Chloride: 105 mmol/L (ref 98–111)
Creatinine, Ser: 0.93 mg/dL (ref 0.61–1.24)
GFR, Estimated: >60 mL/min (ref >60)
Glucose, Bld: 138 mg/dL — ABNORMAL HIGH (ref 70–99)
Potassium: 3.5 mmol/L (ref 3.5–5.1)
Sodium: 138 mmol/L (ref 135–145)
Total Bilirubin: 1.2 mg/dL (ref 0.3–1.2)
Total Protein: 6.1 g/dL — ABNORMAL LOW (ref 6.5–8.1)

## 2023-01-06 LAB — CBC WITH DIFFERENTIAL/PLATELET
Abs Immature Granulocytes: 0.01 10*3/uL (ref 0.00–0.07)
Basophils Absolute: 0 10*3/uL (ref 0.0–0.1)
Basophils Relative: 1 %
Eosinophils Absolute: 0.6 10*3/uL — ABNORMAL HIGH (ref 0.0–0.5)
Eosinophils Relative: 9 %
HCT: 36.1 % — ABNORMAL LOW (ref 39.0–52.0)
Hemoglobin: 11.6 g/dL — ABNORMAL LOW (ref 13.0–17.0)
Immature Granulocytes: 0 %
Lymphocytes Relative: 19 %
Lymphs Abs: 1.2 10*3/uL (ref 0.7–4.0)
MCH: 26.3 pg (ref 26.0–34.0)
MCHC: 32.1 g/dL (ref 30.0–36.0)
MCV: 81.9 fL (ref 80.0–100.0)
Monocytes Absolute: 0.7 10*3/uL (ref 0.1–1.0)
Monocytes Relative: 12 %
Neutro Abs: 3.7 10*3/uL (ref 1.7–7.7)
Neutrophils Relative %: 59 %
Platelets: 225 10*3/uL (ref 150–400)
RBC: 4.41 MIL/uL (ref 4.22–5.81)
RDW: 18.5 % — ABNORMAL HIGH (ref 11.5–15.5)
WBC: 6.2 10*3/uL (ref 4.0–10.5)
nRBC: 0 % (ref 0.0–0.2)

## 2023-01-06 LAB — MAGNESIUM: Magnesium: 2.1 mg/dL (ref 1.7–2.4)

## 2023-01-06 MED ORDER — PALONOSETRON HCL INJECTION 0.25 MG/5ML
0.2500 mg | Freq: Once | INTRAVENOUS | Status: AC
  Administered 2023-01-06: 0.25 mg via INTRAVENOUS
  Filled 2023-01-06: qty 5

## 2023-01-06 MED ORDER — SODIUM CHLORIDE 0.9% FLUSH
10.0000 mL | INTRAVENOUS | Status: DC | PRN
  Administered 2023-01-06: 10 mL

## 2023-01-06 MED ORDER — SODIUM CHLORIDE 0.9 % IV SOLN: Freq: Once | INTRAVENOUS | Status: AC

## 2023-01-06 MED ORDER — AZACITIDINE CHEMO INJECTION 100 MG
75.0000 mg/m2 | Freq: Once | INTRAMUSCULAR | Status: AC
  Administered 2023-01-06: 140 mg via INTRAVENOUS
  Filled 2023-01-06: qty 14

## 2023-01-06 MED ORDER — SODIUM CHLORIDE 0.9% FLUSH
10.0000 mL | INTRAVENOUS | Status: DC | PRN
  Administered 2023-01-06: 10 mL via INTRAVENOUS

## 2023-01-06 MED ORDER — HEPARIN SOD (PORK) LOCK FLUSH 100 UNIT/ML IV SOLN
500.0000 [IU] | Freq: Once | INTRAVENOUS | Status: AC | PRN
  Administered 2023-01-06: 500 [IU]

## 2023-01-06 MED ORDER — SODIUM CHLORIDE 0.9 % IV SOLN: 10.0000 mg | Freq: Once | INTRAVENOUS | Status: DC

## 2023-01-06 MED ORDER — DEXAMETHASONE SODIUM PHOSPHATE 10 MG/ML IJ SOLN
10.0000 mg | Freq: Once | INTRAMUSCULAR | Status: AC
  Administered 2023-01-06: 10 mg via INTRAVENOUS
  Filled 2023-01-06: qty 1

## 2023-01-06 NOTE — Progress Notes (Signed)
Patient is taking venetoclax as prescribed.  He has not missed any doses and reports no side effects at this time.    Patient has been examined by Dr. Delton Coombes. Vital signs and labs have been reviewed by MD - ANC, Creatinine, LFTs, hemoglobin, and platelets are within treatment parameters per M.D. - pt may proceed with treatment.  Primary RN and pharmacy notified.

## 2023-01-06 NOTE — Progress Notes (Signed)
Patient takes BP meds at 1500.  Provider made aware.    Patient tolerated chemotherapy with no complaints voiced.  Side effects with management reviewed with understanding verbalized.  Port site clean and dry with no bruising or swelling noted at site.  Good blood return noted before and after administration of chemotherapy.  Band aid applied.  Patient left in satisfactory condition with VSS and no s/s of distress noted.

## 2023-01-06 NOTE — Progress Notes (Signed)
Patients port flushed without difficulty.  Good blood return noted with no bruising or swelling noted at site.  Patient remains accessed for treatment.  

## 2023-01-06 NOTE — Patient Instructions (Signed)

## 2023-01-06 NOTE — Patient Instructions (Addendum)
[facility]  Discharge Instructions: Thank you for choosing [facility] to provide your oncology and hematology care.  If you have a lab appointment with the Cancer Center - please note that after April 8th, 2024, all labs will be drawn in the cancer center.  You do not have to check in or register with the main entrance as you have in the past but will complete your check-in in the cancer center.  Wear comfortable clothing and clothing appropriate for easy access to any Portacath or PICC line.   We strive to give you quality time with your provider. You may need to reschedule your appointment if you arrive late (15 or more minutes).  Arriving late affects you and other patients whose appointments are after yours.  Also, if you miss three or more appointments without notifying the office, you may be dismissed from the clinic at the provider's discretion.      For prescription refill requests, have your pharmacy contact our office and allow 72 hours for refills to be completed.    Today you received the following chemotherapy and/or immunotherapy agents vidaza   To help prevent nausea and vomiting after your treatment, we encourage you to take your nausea medication as directed.  BELOW ARE SYMPTOMS THAT SHOULD BE REPORTED IMMEDIATELY: *FEVER GREATER THAN 100.4 F (38 C) OR HIGHER *CHILLS OR SWEATING *NAUSEA AND VOMITING THAT IS NOT CONTROLLED WITH YOUR NAUSEA MEDICATION *UNUSUAL SHORTNESS OF BREATH *UNUSUAL BRUISING OR BLEEDING *URINARY PROBLEMS (pain or burning when urinating, or frequent urination) *BOWEL PROBLEMS (unusual diarrhea, constipation, pain near the anus) TENDERNESS IN MOUTH AND THROAT WITH OR WITHOUT PRESENCE OF ULCERS (sore throat, sores in mouth, or a toothache) UNUSUAL RASH, SWELLING OR PAIN  UNUSUAL VAGINAL DISCHARGE OR ITCHING   Items with * indicate a potential emergency and should be followed up as soon as possible or go to the  Emergency Department if any problems should occur.  Please show the CHEMOTHERAPY ALERT CARD or IMMUNOTHERAPY ALERT CARD at check-in to the Emergency Department and triage nurse.  Should you have questions after your visit or need to cancel or reschedule your appointment, please contact [facility] [phone]  and follow the prompts.  Office hours are 8:00 a.m. to 4:30 p.m. Monday - Friday. Please note that voicemails left after 4:00 p.m. may not be returned until the following business day.  We are closed weekends and major holidays. You have access to a nurse at all times for urgent questions. Please call the main number to the clinic [phone] and follow the prompts.  For any non-urgent questions, you may also contact your provider using MyChart. We now offer e-Visits for anyone 18 and older to request care online for non-urgent symptoms. For details visit [email or web address].   Also download the MyChart app! Go to the app store, search "MyChart", open the app, select , and log in with your MyChart username and password.   

## 2023-01-07 ENCOUNTER — Inpatient Hospital Stay: Payer: Medicare HMO

## 2023-01-07 ENCOUNTER — Other Ambulatory Visit: Payer: Self-pay

## 2023-01-07 VITALS — BP 131/80 | HR 60 | Temp 98.3°F | Resp 18

## 2023-01-07 DIAGNOSIS — Z95828 Presence of other vascular implants and grafts: Secondary | ICD-10-CM

## 2023-01-07 DIAGNOSIS — D469 Myelodysplastic syndrome, unspecified: Secondary | ICD-10-CM

## 2023-01-07 DIAGNOSIS — D4622 Refractory anemia with excess of blasts 2: Secondary | ICD-10-CM | POA: Diagnosis not present

## 2023-01-07 MED ORDER — SODIUM CHLORIDE 0.9 % IV SOLN: Freq: Once | INTRAVENOUS | Status: AC

## 2023-01-07 MED ORDER — HEPARIN SOD (PORK) LOCK FLUSH 100 UNIT/ML IV SOLN
500.0000 [IU] | Freq: Once | INTRAVENOUS | Status: AC | PRN
  Administered 2023-01-07: 500 [IU]

## 2023-01-07 MED ORDER — SODIUM CHLORIDE 0.9% FLUSH: 10.0000 mL | INTRAVENOUS | Status: DC | PRN

## 2023-01-07 MED ORDER — AZACITIDINE CHEMO INJECTION 100 MG
75.0000 mg/m2 | Freq: Once | INTRAMUSCULAR | Status: AC
  Administered 2023-01-07: 140 mg via INTRAVENOUS
  Filled 2023-01-07: qty 14

## 2023-01-07 MED ORDER — DEXAMETHASONE SODIUM PHOSPHATE 10 MG/ML IJ SOLN
10.0000 mg | Freq: Once | INTRAMUSCULAR | Status: AC
  Administered 2023-01-07: 10 mg via INTRAVENOUS
  Filled 2023-01-07: qty 1

## 2023-01-07 NOTE — Progress Notes (Signed)
Patient presents today for D2 Vidaza infusion. Patient is in satisfactory condition with no new complaints voiced.  Vital signs are stable.  Labs reviewed from 01/06/23 and all labs are within treatment parameters.  We will proceed with treatment per MD orders.    Treatment given today per MD orders. Tolerated infusion without adverse affects. Vital signs stable. No complaints at this time. Discharged from clinic ambulatory in stable condition. Alert and oriented x 3. F/U with [facility] as scheduled.

## 2023-01-07 NOTE — Patient Instructions (Signed)
[facility]  Discharge Instructions: Thank you for choosing [facility] to provide your oncology and hematology care.  If you have a lab appointment with the Cancer Center - please note that after April 8th, 2024, all labs will be drawn in the cancer center.  You do not have to check in or register with the main entrance as you have in the past but will complete your check-in in the cancer center.  Wear comfortable clothing and clothing appropriate for easy access to any Portacath or PICC line.   We strive to give you quality time with your provider. You may need to reschedule your appointment if you arrive late (15 or more minutes).  Arriving late affects you and other patients whose appointments are after yours.  Also, if you miss three or more appointments without notifying the office, you may be dismissed from the clinic at the provider's discretion.      For prescription refill requests, have your pharmacy contact our office and allow 72 hours for refills to be completed.    Today you received the following chemotherapy and/or immunotherapy agents D2 Vidaza   To help prevent nausea and vomiting after your treatment, we encourage you to take your nausea medication as directed.  Azacitidine Injection What is this medication? AZACITIDINE (ay za SITE i deen) treats blood and bone marrow cancers. It works by slowing down the growth of cancer cells. This medicine may be used for other purposes; ask your health care provider or pharmacist if you have questions. COMMON BRAND NAME(S): Vidaza What should I tell my care team before I take this medication? They need to know if you have any of these conditions: Kidney disease Liver disease Low blood cell levels, such as low white cells, platelets, or red blood cells Low levels of albumin in the blood Low levels of bicarbonate in the blood An unusual or allergic reaction to azacitidine, mannitol, other  medications, foods, dyes, or preservatives If you or your partner are pregnant or trying to get pregnant Breast-feeding How should I use this medication? This medication is injected into a vein or under the skin. It is given by your care team in a hospital or clinic setting. Talk to your care team about the use of this medication in children. While it may be prescribed for children as young as 1 month for selected conditions, precautions do apply. Overdosage: If you think you have taken too much of this medicine contact a poison control center or emergency room at once. NOTE: This medicine is only for you. Do not share this medicine with others. What if I miss a dose? Keep appointments for follow-up doses. It is important not to miss your dose. Call your care team if you are unable to keep an appointment. What may interact with this medication? Interactions are not expected. This list may not describe all possible interactions. Give your health care provider a list of all the medicines, herbs, non-prescription drugs, or dietary supplements you use. Also tell them if you smoke, drink alcohol, or use illegal drugs. Some items may interact with your medicine. What should I watch for while using this medication? Your condition will be monitored carefully while you are receiving this medication. You may need blood work while taking this medication. This medication may make you feel generally unwell. This is not uncommon as chemotherapy can affect healthy cells as well as cancer cells. Report any side effects. Continue your course of treatment   even though you feel ill unless your care team tells you to stop. Other product types may be available that contain the medication azacitidine. The injection and oral products should not be used in place of one another. Talk to your care team if you have questions. This medication can cause serious side effects. To reduce the risk, your care team may give you other  medications to take before receiving this one. Be sure to follow the directions from your care team. This medication may increase your risk of getting an infection. Call your care team for advice if you get a fever, chills, sore throat, or other symptoms of a cold or flu. Do not treat yourself. Try to avoid being around people who are sick. Avoid taking medications that contain aspirin, acetaminophen, ibuprofen, naproxen, or ketoprofen unless instructed by your care team. These medications may hide a fever. This medication may increase your risk to bruise or bleed. Call your care team if you notice any unusual bleeding. Be careful brushing or flossing your teeth or using a toothpick because you may get an infection or bleed more easily. If you have any dental work done, tell your dentist you are receiving this medication. Talk to your care team if you or your partner may be pregnant. Serious birth defects can occur if you take this medication during pregnancy and for 6 months after the last dose. You will need a negative pregnancy test before starting this medication. Contraception is recommended while taking his medication and for 6 months after the last dose. Your care team can help you find the option that works for you. If your partner can get pregnant, use a condom during sex while taking this medication and for 3 months after the last dose. Do not breastfeed while taking this medication and for 1 week after the last dose. This medication may cause infertility. Talk to your care team if you are concerned about your fertility. What side effects may I notice from receiving this medication? Side effects that you should report to your care team as soon as possible: Allergic reactions--skin rash, itching, hives, swelling of the face, lips, tongue, or throat Infection--fever, chills, cough, sore throat, wounds that don't heal, pain or trouble when passing urine, general feeling of discomfort or being  unwell Kidney injury--decrease in the amount of urine, swelling of the ankles, hands, or feet Liver injury--right upper belly pain, loss of appetite, nausea, light-colored stool, dark yellow or brown urine, yellowing skin or eyes, unusual weakness or fatigue Low red blood cell level--unusual weakness or fatigue, dizziness, headache, trouble breathing Tumor lysis syndrome (TLS)--nausea, vomiting, diarrhea, decrease in the amount of urine, dark urine, unusual weakness or fatigue, confusion, muscle pain or cramps, fast or irregular heartbeat, joint pain Unusual bruising or bleeding Side effects that usually do not require medical attention (report to your care team if they continue or are bothersome): Constipation Diarrhea Nausea Pain, redness, or irritation at injection site Vomiting This list may not describe all possible side effects. Call your doctor for medical advice about side effects. You may report side effects to FDA at 1-800-FDA-1088. Where should I keep my medication? This medication is given in a hospital or clinic. It will not be stored at home. NOTE: This sheet is a summary. It may not cover all possible information. If you have questions about this medicine, talk to your doctor, pharmacist, or health care provider.  2024 Elsevier/Gold Standard (2021-07-19 00:00:00)   BELOW ARE SYMPTOMS THAT SHOULD BE   REPORTED IMMEDIATELY: *FEVER GREATER THAN 100.4 F (38 C) OR HIGHER *CHILLS OR SWEATING *NAUSEA AND VOMITING THAT IS NOT CONTROLLED WITH YOUR NAUSEA MEDICATION *UNUSUAL SHORTNESS OF BREATH *UNUSUAL BRUISING OR BLEEDING *URINARY PROBLEMS (pain or burning when urinating, or frequent urination) *BOWEL PROBLEMS (unusual diarrhea, constipation, pain near the anus) TENDERNESS IN MOUTH AND THROAT WITH OR WITHOUT PRESENCE OF ULCERS (sore throat, sores in mouth, or a toothache) UNUSUAL RASH, SWELLING OR PAIN  UNUSUAL VAGINAL DISCHARGE OR ITCHING   Items with * indicate a potential  emergency and should be followed up as soon as possible or go to the Emergency Department if any problems should occur.  Please show the CHEMOTHERAPY ALERT CARD or IMMUNOTHERAPY ALERT CARD at check-in to the Emergency Department and triage nurse.  Should you have questions after your visit or need to cancel or reschedule your appointment, please contact [facility] [phone]  and follow the prompts.  Office hours are 8:00 a.m. to 4:30 p.m. Monday - Friday. Please note that voicemails left after 4:00 p.m. may not be returned until the following business day.  We are closed weekends and major holidays. You have access to a nurse at all times for urgent questions. Please call the main number to the clinic [phone] and follow the prompts.  For any non-urgent questions, you may also contact your provider using MyChart. We now offer e-Visits for anyone 18 and older to request care online for non-urgent symptoms. For details visit [email or web address].   Also download the MyChart app! Go to the app store, search "MyChart", open the app, select [facility], and log in with your MyChart username and password.   

## 2023-01-08 ENCOUNTER — Inpatient Hospital Stay: Payer: Medicare HMO

## 2023-01-08 VITALS — BP 123/76 | HR 56 | Temp 97.9°F | Resp 18

## 2023-01-08 DIAGNOSIS — D4622 Refractory anemia with excess of blasts 2: Secondary | ICD-10-CM | POA: Diagnosis not present

## 2023-01-08 DIAGNOSIS — D469 Myelodysplastic syndrome, unspecified: Secondary | ICD-10-CM

## 2023-01-08 DIAGNOSIS — Z95828 Presence of other vascular implants and grafts: Secondary | ICD-10-CM

## 2023-01-08 MED ORDER — PALONOSETRON HCL INJECTION 0.25 MG/5ML
0.2500 mg | Freq: Once | INTRAVENOUS | Status: AC
  Administered 2023-01-08: 0.25 mg via INTRAVENOUS
  Filled 2023-01-08: qty 5

## 2023-01-08 MED ORDER — SODIUM CHLORIDE 0.9 % IV SOLN: Freq: Once | INTRAVENOUS | Status: AC

## 2023-01-08 MED ORDER — AZACITIDINE CHEMO INJECTION 100 MG
75.0000 mg/m2 | Freq: Once | INTRAMUSCULAR | Status: AC
  Administered 2023-01-08: 140 mg via INTRAVENOUS
  Filled 2023-01-08: qty 14

## 2023-01-08 MED ORDER — HEPARIN SOD (PORK) LOCK FLUSH 100 UNIT/ML IV SOLN
500.0000 [IU] | Freq: Once | INTRAVENOUS | Status: DC | PRN
Start: 2023-01-08 — End: 2023-01-08

## 2023-01-08 MED ORDER — DEXAMETHASONE SODIUM PHOSPHATE 10 MG/ML IJ SOLN
10.0000 mg | Freq: Once | INTRAMUSCULAR | Status: AC
  Administered 2023-01-08: 10 mg via INTRAVENOUS
  Filled 2023-01-08: qty 1

## 2023-01-08 MED ORDER — SODIUM CHLORIDE 0.9% FLUSH: 10.0000 mL | INTRAVENOUS | Status: DC | PRN

## 2023-01-08 NOTE — Patient Instructions (Signed)
[facility]  Discharge Instructions: Thank you for choosing [facility] to provide your oncology and hematology care.  If you have a lab appointment with the Cancer Center - please note that after April 8th, 2024, all labs will be drawn in the cancer center.  You do not have to check in or register with the main entrance as you have in the past but will complete your check-in in the cancer center.  Wear comfortable clothing and clothing appropriate for easy access to any Portacath or PICC line.   We strive to give you quality time with your provider. You may need to reschedule your appointment if you arrive late (15 or more minutes).  Arriving late affects you and other patients whose appointments are after yours.  Also, if you miss three or more appointments without notifying the office, you may be dismissed from the clinic at the provider's discretion.      For prescription refill requests, have your pharmacy contact our office and allow 72 hours for refills to be completed.    Today you received the following chemotherapy and/or immunotherapy agents Vidaza.  Azacitidine Injection What is this medication? AZACITIDINE (ay za SITE i deen) treats blood and bone marrow cancers. It works by slowing down the growth of cancer cells. This medicine may be used for other purposes; ask your health care provider or pharmacist if you have questions. COMMON BRAND NAME(S): Vidaza What should I tell my care team before I take this medication? They need to know if you have any of these conditions: Kidney disease Liver disease Low blood cell levels, such as low white cells, platelets, or red blood cells Low levels of albumin in the blood Low levels of bicarbonate in the blood An unusual or allergic reaction to azacitidine, mannitol, other medications, foods, dyes, or preservatives If you or your partner are pregnant or trying to get pregnant Breast-feeding How should I  use this medication? This medication is injected into a vein or under the skin. It is given by your care team in a hospital or clinic setting. Talk to your care team about the use of this medication in children. While it may be prescribed for children as young as 1 month for selected conditions, precautions do apply. Overdosage: If you think you have taken too much of this medicine contact a poison control center or emergency room at once. NOTE: This medicine is only for you. Do not share this medicine with others. What if I miss a dose? Keep appointments for follow-up doses. It is important not to miss your dose. Call your care team if you are unable to keep an appointment. What may interact with this medication? Interactions are not expected. This list may not describe all possible interactions. Give your health care provider a list of all the medicines, herbs, non-prescription drugs, or dietary supplements you use. Also tell them if you smoke, drink alcohol, or use illegal drugs. Some items may interact with your medicine. What should I watch for while using this medication? Your condition will be monitored carefully while you are receiving this medication. You may need blood work while taking this medication. This medication may make you feel generally unwell. This is not uncommon as chemotherapy can affect healthy cells as well as cancer cells. Report any side effects. Continue your course of treatment even though you feel ill unless your care team tells you to stop. Other product types may be available that contain the   medication azacitidine. The injection and oral products should not be used in place of one another. Talk to your care team if you have questions. This medication can cause serious side effects. To reduce the risk, your care team may give you other medications to take before receiving this one. Be sure to follow the directions from your care team. This medication may increase your  risk of getting an infection. Call your care team for advice if you get a fever, chills, sore throat, or other symptoms of a cold or flu. Do not treat yourself. Try to avoid being around people who are sick. Avoid taking medications that contain aspirin, acetaminophen, ibuprofen, naproxen, or ketoprofen unless instructed by your care team. These medications may hide a fever. This medication may increase your risk to bruise or bleed. Call your care team if you notice any unusual bleeding. Be careful brushing or flossing your teeth or using a toothpick because you may get an infection or bleed more easily. If you have any dental work done, tell your dentist you are receiving this medication. Talk to your care team if you or your partner may be pregnant. Serious birth defects can occur if you take this medication during pregnancy and for 6 months after the last dose. You will need a negative pregnancy test before starting this medication. Contraception is recommended while taking his medication and for 6 months after the last dose. Your care team can help you find the option that works for you. If your partner can get pregnant, use a condom during sex while taking this medication and for 3 months after the last dose. Do not breastfeed while taking this medication and for 1 week after the last dose. This medication may cause infertility. Talk to your care team if you are concerned about your fertility. What side effects may I notice from receiving this medication? Side effects that you should report to your care team as soon as possible: Allergic reactions--skin rash, itching, hives, swelling of the face, lips, tongue, or throat Infection--fever, chills, cough, sore throat, wounds that don't heal, pain or trouble when passing urine, general feeling of discomfort or being unwell Kidney injury--decrease in the amount of urine, swelling of the ankles, hands, or feet Liver injury--right upper belly pain, loss  of appetite, nausea, light-colored stool, dark yellow or Romain Erion urine, yellowing skin or eyes, unusual weakness or fatigue Low red blood cell level--unusual weakness or fatigue, dizziness, headache, trouble breathing Tumor lysis syndrome (TLS)--nausea, vomiting, diarrhea, decrease in the amount of urine, dark urine, unusual weakness or fatigue, confusion, muscle pain or cramps, fast or irregular heartbeat, joint pain Unusual bruising or bleeding Side effects that usually do not require medical attention (report to your care team if they continue or are bothersome): Constipation Diarrhea Nausea Pain, redness, or irritation at injection site Vomiting This list may not describe all possible side effects. Call your doctor for medical advice about side effects. You may report side effects to FDA at 1-800-FDA-1088. Where should I keep my medication? This medication is given in a hospital or clinic. It will not be stored at home. NOTE: This sheet is a summary. It may not cover all possible information. If you have questions about this medicine, talk to your doctor, pharmacist, or health care provider.  2024 Elsevier/Gold Standard (2021-07-19 00:00:00)        To help prevent nausea and vomiting after your treatment, we encourage you to take your nausea medication as directed.  BELOW ARE SYMPTOMS   THAT SHOULD BE REPORTED IMMEDIATELY: *FEVER GREATER THAN 100.4 F (38 C) OR HIGHER *CHILLS OR SWEATING *NAUSEA AND VOMITING THAT IS NOT CONTROLLED WITH YOUR NAUSEA MEDICATION *UNUSUAL SHORTNESS OF BREATH *UNUSUAL BRUISING OR BLEEDING *URINARY PROBLEMS (pain or burning when urinating, or frequent urination) *BOWEL PROBLEMS (unusual diarrhea, constipation, pain near the anus) TENDERNESS IN MOUTH AND THROAT WITH OR WITHOUT PRESENCE OF ULCERS (sore throat, sores in mouth, or a toothache) UNUSUAL RASH, SWELLING OR PAIN  UNUSUAL VAGINAL DISCHARGE OR ITCHING   Items with * indicate a potential emergency and  should be followed up as soon as possible or go to the Emergency Department if any problems should occur.  Please show the CHEMOTHERAPY ALERT CARD or IMMUNOTHERAPY ALERT CARD at check-in to the Emergency Department and triage nurse.  Should you have questions after your visit or need to cancel or reschedule your appointment, please contact [facility] [phone]  and follow the prompts.  Office hours are 8:00 a.m. to 4:30 p.m. Monday - Friday. Please note that voicemails left after 4:00 p.m. may not be returned until the following business day.  We are closed weekends and major holidays. You have access to a nurse at all times for urgent questions. Please call the main number to the clinic [phone] and follow the prompts.  For any non-urgent questions, you may also contact your provider using MyChart. We now offer e-Visits for anyone 18 and older to request care online for non-urgent symptoms. For details visit [email or web address].   Also download the MyChart app! Go to the app store, search "MyChart", open the app, select [facility], and log in with your MyChart username and password.   

## 2023-01-08 NOTE — Progress Notes (Signed)
Patient presents today for chemotherapy infusion.  Patient is in satisfactory condition with no new complaints voiced.  Vital signs are stable.  Labs from 01/06/2023 reviewed.  We will proceed with treatment per MD orders.    Patient tolerated treatment well with no complaints voiced.  Patient left ambulatory in stable condition.  Vital signs stable at discharge.  Follow up as scheduled.

## 2023-01-09 ENCOUNTER — Inpatient Hospital Stay: Payer: Medicare HMO

## 2023-01-09 VITALS — BP 158/99 | HR 65 | Temp 98.0°F | Resp 18

## 2023-01-09 DIAGNOSIS — Z95828 Presence of other vascular implants and grafts: Secondary | ICD-10-CM

## 2023-01-09 DIAGNOSIS — D4622 Refractory anemia with excess of blasts 2: Secondary | ICD-10-CM | POA: Diagnosis not present

## 2023-01-09 DIAGNOSIS — D469 Myelodysplastic syndrome, unspecified: Secondary | ICD-10-CM

## 2023-01-09 MED ORDER — DEXAMETHASONE SODIUM PHOSPHATE 10 MG/ML IJ SOLN
10.0000 mg | Freq: Once | INTRAMUSCULAR | Status: AC
Start: 2023-01-09 — End: 2023-01-09
  Administered 2023-01-09: 10 mg via INTRAVENOUS
  Filled 2023-01-09: qty 1

## 2023-01-09 MED ORDER — HEPARIN SOD (PORK) LOCK FLUSH 100 UNIT/ML IV SOLN
500.0000 [IU] | Freq: Once | INTRAVENOUS | Status: AC | PRN
  Administered 2023-01-09: 500 [IU]

## 2023-01-09 MED ORDER — SODIUM CHLORIDE 0.9 % IV SOLN: Freq: Once | INTRAVENOUS | Status: AC

## 2023-01-09 MED ORDER — AZACITIDINE CHEMO INJECTION 100 MG
75.0000 mg/m2 | Freq: Once | INTRAMUSCULAR | Status: AC
  Administered 2023-01-09: 140 mg via INTRAVENOUS
  Filled 2023-01-09: qty 14

## 2023-01-09 MED ORDER — SODIUM CHLORIDE 0.9% FLUSH
10.0000 mL | INTRAVENOUS | Status: DC | PRN
  Administered 2023-01-09: 10 mL

## 2023-01-09 NOTE — Progress Notes (Signed)
Patient presents today for chemotherapy infusion.  Patient is in satisfactory condition with no new complaints voiced.  Vital signs are stable.  Labs from 01/06/2023 reviewed by Dr. Ellin Saba.  We will proceed with treatment per MD orders.    Patient tolerated treatment well with no complaints voiced.  Patient left ambulatory in stable condition.  Vital signs stable at discharge.  Follow up as scheduled.

## 2023-01-09 NOTE — Patient Instructions (Signed)
[facility]  Discharge Instructions: Thank you for choosing [facility] to provide your oncology and hematology care.  If you have a lab appointment with the Cancer Center - please note that after April 8th, 2024, all labs will be drawn in the cancer center.  You do not have to check in or register with the main entrance as you have in the past but will complete your check-in in the cancer center.  Wear comfortable clothing and clothing appropriate for easy access to any Portacath or PICC line.   We strive to give you quality time with your provider. You may need to reschedule your appointment if you arrive late (15 or more minutes).  Arriving late affects you and other patients whose appointments are after yours.  Also, if you miss three or more appointments without notifying the office, you may be dismissed from the clinic at the provider's discretion.      For prescription refill requests, have your pharmacy contact our office and allow 72 hours for refills to be completed.    Today you received the following chemotherapy and/or immunotherapy agents Vidaza.  Azacitidine Injection What is this medication? AZACITIDINE (ay za SITE i deen) treats blood and bone marrow cancers. It works by slowing down the growth of cancer cells. This medicine may be used for other purposes; ask your health care provider or pharmacist if you have questions. COMMON BRAND NAME(S): Vidaza What should I tell my care team before I take this medication? They need to know if you have any of these conditions: Kidney disease Liver disease Low blood cell levels, such as low white cells, platelets, or red blood cells Low levels of albumin in the blood Low levels of bicarbonate in the blood An unusual or allergic reaction to azacitidine, mannitol, other medications, foods, dyes, or preservatives If you or your partner are pregnant or trying to get pregnant Breast-feeding How should I  use this medication? This medication is injected into a vein or under the skin. It is given by your care team in a hospital or clinic setting. Talk to your care team about the use of this medication in children. While it may be prescribed for children as young as 1 month for selected conditions, precautions do apply. Overdosage: If you think you have taken too much of this medicine contact a poison control center or emergency room at once. NOTE: This medicine is only for you. Do not share this medicine with others. What if I miss a dose? Keep appointments for follow-up doses. It is important not to miss your dose. Call your care team if you are unable to keep an appointment. What may interact with this medication? Interactions are not expected. This list may not describe all possible interactions. Give your health care provider a list of all the medicines, herbs, non-prescription drugs, or dietary supplements you use. Also tell them if you smoke, drink alcohol, or use illegal drugs. Some items may interact with your medicine. What should I watch for while using this medication? Your condition will be monitored carefully while you are receiving this medication. You may need blood work while taking this medication. This medication may make you feel generally unwell. This is not uncommon as chemotherapy can affect healthy cells as well as cancer cells. Report any side effects. Continue your course of treatment even though you feel ill unless your care team tells you to stop. Other product types may be available that contain the  medication azacitidine. The injection and oral products should not be used in place of one another. Talk to your care team if you have questions. This medication can cause serious side effects. To reduce the risk, your care team may give you other medications to take before receiving this one. Be sure to follow the directions from your care team. This medication may increase your  risk of getting an infection. Call your care team for advice if you get a fever, chills, sore throat, or other symptoms of a cold or flu. Do not treat yourself. Try to avoid being around people who are sick. Avoid taking medications that contain aspirin, acetaminophen, ibuprofen, naproxen, or ketoprofen unless instructed by your care team. These medications may hide a fever. This medication may increase your risk to bruise or bleed. Call your care team if you notice any unusual bleeding. Be careful brushing or flossing your teeth or using a toothpick because you may get an infection or bleed more easily. If you have any dental work done, tell your dentist you are receiving this medication. Talk to your care team if you or your partner may be pregnant. Serious birth defects can occur if you take this medication during pregnancy and for 6 months after the last dose. You will need a negative pregnancy test before starting this medication. Contraception is recommended while taking his medication and for 6 months after the last dose. Your care team can help you find the option that works for you. If your partner can get pregnant, use a condom during sex while taking this medication and for 3 months after the last dose. Do not breastfeed while taking this medication and for 1 week after the last dose. This medication may cause infertility. Talk to your care team if you are concerned about your fertility. What side effects may I notice from receiving this medication? Side effects that you should report to your care team as soon as possible: Allergic reactions--skin rash, itching, hives, swelling of the face, lips, tongue, or throat Infection--fever, chills, cough, sore throat, wounds that don't heal, pain or trouble when passing urine, general feeling of discomfort or being unwell Kidney injury--decrease in the amount of urine, swelling of the ankles, hands, or feet Liver injury--right upper belly pain, loss  of appetite, nausea, light-colored stool, dark yellow or Kalen Ratajczak urine, yellowing skin or eyes, unusual weakness or fatigue Low red blood cell level--unusual weakness or fatigue, dizziness, headache, trouble breathing Tumor lysis syndrome (TLS)--nausea, vomiting, diarrhea, decrease in the amount of urine, dark urine, unusual weakness or fatigue, confusion, muscle pain or cramps, fast or irregular heartbeat, joint pain Unusual bruising or bleeding Side effects that usually do not require medical attention (report to your care team if they continue or are bothersome): Constipation Diarrhea Nausea Pain, redness, or irritation at injection site Vomiting This list may not describe all possible side effects. Call your doctor for medical advice about side effects. You may report side effects to FDA at 1-800-FDA-1088. Where should I keep my medication? This medication is given in a hospital or clinic. It will not be stored at home. NOTE: This sheet is a summary. It may not cover all possible information. If you have questions about this medicine, talk to your doctor, pharmacist, or health care provider.  2024 Elsevier/Gold Standard (2021-07-19 00:00:00)        To help prevent nausea and vomiting after your treatment, we encourage you to take your nausea medication as directed.  BELOW ARE SYMPTOMS  THAT SHOULD BE REPORTED IMMEDIATELY: *FEVER GREATER THAN 100.4 F (38 C) OR HIGHER *CHILLS OR SWEATING *NAUSEA AND VOMITING THAT IS NOT CONTROLLED WITH YOUR NAUSEA MEDICATION *UNUSUAL SHORTNESS OF BREATH *UNUSUAL BRUISING OR BLEEDING *URINARY PROBLEMS (pain or burning when urinating, or frequent urination) *BOWEL PROBLEMS (unusual diarrhea, constipation, pain near the anus) TENDERNESS IN MOUTH AND THROAT WITH OR WITHOUT PRESENCE OF ULCERS (sore throat, sores in mouth, or a toothache) UNUSUAL RASH, SWELLING OR PAIN  UNUSUAL VAGINAL DISCHARGE OR ITCHING   Items with * indicate a potential emergency and  should be followed up as soon as possible or go to the Emergency Department if any problems should occur.  Please show the CHEMOTHERAPY ALERT CARD or IMMUNOTHERAPY ALERT CARD at check-in to the Emergency Department and triage nurse.  Should you have questions after your visit or need to cancel or reschedule your appointment, please contact [facility] [phone]  and follow the prompts.  Office hours are 8:00 a.m. to 4:30 p.m. Monday - Friday. Please note that voicemails left after 4:00 p.m. may not be returned until the following business day.  We are closed weekends and major holidays. You have access to a nurse at all times for urgent questions. Please call the main number to the clinic [phone] and follow the prompts.  For any non-urgent questions, you may also contact your provider using MyChart. We now offer e-Visits for anyone 75 and older to request care online for non-urgent symptoms. For details visit [email or web address].   Also download the MyChart app! Go to the app store, search "MyChart", open the app, select [facility], and log in with your MyChart username and password.

## 2023-01-10 ENCOUNTER — Inpatient Hospital Stay: Payer: Medicare HMO

## 2023-01-10 VITALS — BP 139/93 | HR 60 | Temp 97.0°F | Resp 18

## 2023-01-10 DIAGNOSIS — D4622 Refractory anemia with excess of blasts 2: Secondary | ICD-10-CM | POA: Diagnosis not present

## 2023-01-10 DIAGNOSIS — D469 Myelodysplastic syndrome, unspecified: Secondary | ICD-10-CM

## 2023-01-10 DIAGNOSIS — Z95828 Presence of other vascular implants and grafts: Secondary | ICD-10-CM

## 2023-01-10 MED ORDER — AZACITIDINE CHEMO INJECTION 100 MG
75.0000 mg/m2 | Freq: Once | INTRAMUSCULAR | Status: AC
  Administered 2023-01-10: 140 mg via INTRAVENOUS
  Filled 2023-01-10: qty 14

## 2023-01-10 MED ORDER — DEXAMETHASONE SODIUM PHOSPHATE 10 MG/ML IJ SOLN
10.0000 mg | Freq: Once | INTRAMUSCULAR | Status: AC
Start: 2023-01-10 — End: 2023-01-10
  Administered 2023-01-10: 10 mg via INTRAVENOUS
  Filled 2023-01-10: qty 1

## 2023-01-10 MED ORDER — PALONOSETRON HCL INJECTION 0.25 MG/5ML
0.2500 mg | Freq: Once | INTRAVENOUS | Status: AC
  Administered 2023-01-10: 0.25 mg via INTRAVENOUS
  Filled 2023-01-10: qty 5

## 2023-01-10 MED ORDER — HEPARIN SOD (PORK) LOCK FLUSH 100 UNIT/ML IV SOLN
500.0000 [IU] | Freq: Once | INTRAVENOUS | Status: AC | PRN
  Administered 2023-01-10: 500 [IU]

## 2023-01-10 MED ORDER — SODIUM CHLORIDE 0.9 % IV SOLN: Freq: Once | INTRAVENOUS | Status: AC

## 2023-01-10 NOTE — Progress Notes (Signed)
Patient tolerated chemotherapy with no complaints voiced.  Side effects with management reviewed with understanding verbalized.  Port site clean and dry with no bruising or swelling noted at site.  A tinged of blood return noted before and after administration of chemotherapy. Per pt no pain, swelling, or discomfort when flushing port. Port flushes with ease.  Patient left in satisfactory condition with VSS and no s/s of distress noted. All follow ups as scheduled.  Chad Lamb [employer]

## 2023-01-10 NOTE — Patient Instructions (Signed)
[facility]  Discharge Instructions: Thank you for choosing [facility] to provide your oncology and hematology care.  If you have a lab appointment with the Cancer Center - please note that after April 8th, 2024, all labs will be drawn in the cancer center.  You do not have to check in or register with the main entrance as you have in the past but will complete your check-in in the cancer center.  Wear comfortable clothing and clothing appropriate for easy access to any Portacath or PICC line.   We strive to give you quality time with your provider. You may need to reschedule your appointment if you arrive late (15 or more minutes).  Arriving late affects you and other patients whose appointments are after yours.  Also, if you miss three or more appointments without notifying the office, you may be dismissed from the clinic at the provider's discretion.      For prescription refill requests, have your pharmacy contact our office and allow 72 hours for refills to be completed.    Today you received the following chemotherapy and/or immunotherapy agents Vidaza   To help prevent nausea and vomiting after your treatment, we encourage you to take your nausea medication as directed.  BELOW ARE SYMPTOMS THAT SHOULD BE REPORTED IMMEDIATELY: *FEVER GREATER THAN 100.4 F (38 C) OR HIGHER *CHILLS OR SWEATING *NAUSEA AND VOMITING THAT IS NOT CONTROLLED WITH YOUR NAUSEA MEDICATION *UNUSUAL SHORTNESS OF BREATH *UNUSUAL BRUISING OR BLEEDING *URINARY PROBLEMS (pain or burning when urinating, or frequent urination) *BOWEL PROBLEMS (unusual diarrhea, constipation, pain near the anus) TENDERNESS IN MOUTH AND THROAT WITH OR WITHOUT PRESENCE OF ULCERS (sore throat, sores in mouth, or a toothache) UNUSUAL RASH, SWELLING OR PAIN  UNUSUAL VAGINAL DISCHARGE OR ITCHING   Items with * indicate a potential emergency and should be followed up as soon as possible or go to the  Emergency Department if any problems should occur.  Please show the CHEMOTHERAPY ALERT CARD or IMMUNOTHERAPY ALERT CARD at check-in to the Emergency Department and triage nurse.  Should you have questions after your visit or need to cancel or reschedule your appointment, please contact [facility] [phone]  and follow the prompts.  Office hours are 8:00 a.m. to 4:30 p.m. Monday - Friday. Please note that voicemails left after 4:00 p.m. may not be returned until the following business day.  We are closed weekends and major holidays. You have access to a nurse at all times for urgent questions. Please call the main number to the clinic [phone] and follow the prompts.  For any non-urgent questions, you may also contact your provider using MyChart. We now offer e-Visits for anyone 18 and older to request care online for non-urgent symptoms. For details visit [email or web address].   Also download the MyChart app! Go to the app store, search "MyChart", open the app, select [facility], and log in with your MyChart username and password.   

## 2023-01-23 ENCOUNTER — Other Ambulatory Visit (HOSPITAL_COMMUNITY): Payer: Self-pay

## 2023-01-23 ENCOUNTER — Other Ambulatory Visit: Payer: Self-pay

## 2023-01-23 ENCOUNTER — Other Ambulatory Visit: Payer: Self-pay | Admitting: Hematology

## 2023-01-23 DIAGNOSIS — D469 Myelodysplastic syndrome, unspecified: Secondary | ICD-10-CM

## 2023-01-23 MED ORDER — VENETOCLAX 100 MG PO TABS
200.0000 mg | ORAL_TABLET | Freq: Every day | ORAL | 1 refills | Status: DC
  Filled 2023-01-23: qty 28, 28d supply, fill #0
  Filled 2023-03-05: qty 28, 28d supply, fill #1

## 2023-01-23 NOTE — Progress Notes (Signed)
Specialty Pharmacy Refill Coordination Note  Chad Lamb is a 77 y.o. male contacted today regarding refills of specialty medication(s) Venetoclax   Patient requested Delivery   Delivery date: 02/11/23   Verified address: [address], [place], [address]   Medication will be filled on 02/10/23.  Refill request pending.

## 2023-01-31 ENCOUNTER — Other Ambulatory Visit: Payer: Self-pay

## 2023-02-03 ENCOUNTER — Inpatient Hospital Stay: Payer: Medicare HMO | Admitting: Hematology

## 2023-02-03 ENCOUNTER — Inpatient Hospital Stay: Payer: Medicare HMO

## 2023-02-03 ENCOUNTER — Ambulatory Visit: Payer: Medicare HMO

## 2023-02-04 ENCOUNTER — Inpatient Hospital Stay: Payer: Medicare HMO

## 2023-02-05 ENCOUNTER — Inpatient Hospital Stay: Payer: Medicare HMO

## 2023-02-06 ENCOUNTER — Inpatient Hospital Stay: Payer: Medicare HMO

## 2023-02-07 ENCOUNTER — Inpatient Hospital Stay: Payer: Medicare HMO

## 2023-02-17 ENCOUNTER — Inpatient Hospital Stay: Payer: Medicare HMO

## 2023-02-17 ENCOUNTER — Inpatient Hospital Stay (HOSPITAL_BASED_OUTPATIENT_CLINIC_OR_DEPARTMENT_OTHER): Payer: Medicare HMO | Admitting: Hematology

## 2023-02-17 ENCOUNTER — Inpatient Hospital Stay: Payer: Medicare HMO | Attending: Hematology

## 2023-02-17 VITALS — BP 139/88 | HR 62 | Temp 97.8°F | Resp 18

## 2023-02-17 VITALS — BP 143/93 | HR 58 | Resp 17

## 2023-02-17 VITALS — Wt 152.2 lb

## 2023-02-17 DIAGNOSIS — D469 Myelodysplastic syndrome, unspecified: Secondary | ICD-10-CM

## 2023-02-17 DIAGNOSIS — Z95828 Presence of other vascular implants and grafts: Secondary | ICD-10-CM | POA: Diagnosis not present

## 2023-02-17 DIAGNOSIS — Z5111 Encounter for antineoplastic chemotherapy: Secondary | ICD-10-CM | POA: Insufficient documentation

## 2023-02-17 DIAGNOSIS — D4622 Refractory anemia with excess of blasts 2: Secondary | ICD-10-CM | POA: Insufficient documentation

## 2023-02-17 LAB — CBC WITH DIFFERENTIAL/PLATELET
Abs Immature Granulocytes: 0.01 10*3/uL (ref 0.00–0.07)
Basophils Absolute: 0.1 10*3/uL (ref 0.0–0.1)
Basophils Relative: 1 %
Eosinophils Absolute: 0.9 10*3/uL — ABNORMAL HIGH (ref 0.0–0.5)
Eosinophils Relative: 15 %
HCT: 37.6 % — ABNORMAL LOW (ref 39.0–52.0)
Hemoglobin: 11.6 g/dL — ABNORMAL LOW (ref 13.0–17.0)
Immature Granulocytes: 0 %
Lymphocytes Relative: 15 %
Lymphs Abs: 0.9 10*3/uL (ref 0.7–4.0)
MCH: 25.3 pg — ABNORMAL LOW (ref 26.0–34.0)
MCHC: 30.9 g/dL (ref 30.0–36.0)
MCV: 81.9 fL (ref 80.0–100.0)
Monocytes Absolute: 0.9 10*3/uL (ref 0.1–1.0)
Monocytes Relative: 14 %
Neutro Abs: 3.5 10*3/uL (ref 1.7–7.7)
Neutrophils Relative %: 55 %
Platelets: 231 10*3/uL (ref 150–400)
RBC: 4.59 MIL/uL (ref 4.22–5.81)
RDW: 17.6 % — ABNORMAL HIGH (ref 11.5–15.5)
WBC: 6.3 10*3/uL (ref 4.0–10.5)
nRBC: 0 % (ref 0.0–0.2)

## 2023-02-17 LAB — COMPREHENSIVE METABOLIC PANEL
ALT: 16 U/L (ref 0–44)
AST: 20 U/L (ref 15–41)
Albumin: 3.6 g/dL (ref 3.5–5.0)
Alkaline Phosphatase: 73 U/L (ref 38–126)
Anion gap: 8 (ref 5–15)
BUN: 14 mg/dL (ref 8–23)
CO2: 25 mmol/L (ref 22–32)
Calcium: 9.7 mg/dL (ref 8.9–10.3)
Chloride: 107 mmol/L (ref 98–111)
Creatinine, Ser: 0.89 mg/dL (ref 0.61–1.24)
GFR, Estimated: >60 mL/min (ref >60)
Glucose, Bld: 117 mg/dL — ABNORMAL HIGH (ref 70–99)
Potassium: 3.6 mmol/L (ref 3.5–5.1)
Sodium: 140 mmol/L (ref 135–145)
Total Bilirubin: 1.1 mg/dL (ref <1.2)
Total Protein: 6.3 g/dL — ABNORMAL LOW (ref 6.5–8.1)

## 2023-02-17 LAB — MAGNESIUM: Magnesium: 2.1 mg/dL (ref 1.7–2.4)

## 2023-02-17 MED ORDER — SODIUM CHLORIDE 0.9% FLUSH
10.0000 mL | INTRAVENOUS | Status: DC | PRN
  Administered 2023-02-17: 10 mL via INTRAVENOUS

## 2023-02-17 MED ORDER — SODIUM CHLORIDE 0.9 % IV SOLN
75.0000 mg/m2 | Freq: Once | INTRAVENOUS | Status: AC
  Administered 2023-02-17: 140 mg via INTRAVENOUS
  Filled 2023-02-17: qty 14

## 2023-02-17 MED ORDER — DEXAMETHASONE SODIUM PHOSPHATE 10 MG/ML IJ SOLN
10.0000 mg | Freq: Once | INTRAMUSCULAR | Status: AC
  Administered 2023-02-17: 10 mg via INTRAVENOUS
  Filled 2023-02-17: qty 1

## 2023-02-17 MED ORDER — PALONOSETRON HCL INJECTION 0.25 MG/5ML
0.2500 mg | Freq: Once | INTRAVENOUS | Status: AC
  Administered 2023-02-17: 0.25 mg via INTRAVENOUS
  Filled 2023-02-17: qty 5

## 2023-02-17 MED ORDER — HEPARIN SOD (PORK) LOCK FLUSH 100 UNIT/ML IV SOLN
500.0000 [IU] | Freq: Once | INTRAVENOUS | Status: AC | PRN
  Administered 2023-02-17: 500 [IU]

## 2023-02-17 MED ORDER — SODIUM CHLORIDE 0.9 % IV SOLN: 10.0000 mg | Freq: Once | INTRAVENOUS | Status: DC

## 2023-02-17 MED ORDER — SODIUM CHLORIDE 0.9 % IV SOLN: Freq: Once | INTRAVENOUS | Status: AC

## 2023-02-17 NOTE — Progress Notes (Signed)
[facility] [address] [place] [address]    Clinic Day:  02/17/2023  Referring physician: Aliene Beams, MD  Patient Care Team: Aliene Beams, MD as PCP - General (Family Medicine) Aliene Beams, MD (Family Medicine) [place] Bali, MD (Inactive) as Consulting Physician (Gastroenterology) Doreatha Massed, MD as Medical Oncologist (Hematology)   ASSESSMENT & PLAN:   Assessment: 1. MDS with EB 2: - Seen at the request of Dr. Tracie Harrier for cytopenias - BMBX on 06/04/2021: Hypercellular marrow with dyspoietic changes involving the granulocytic cell line and megakaryocytes.  Myeloblasts 8% on aspirate smears and 10% by flow.  Cytogenetics 71, XY (20).  MDS FISH panel normal. 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 - IPSS-M score of 0.12, moderate high risk.  Leukemia free survival is 2.3 years.  Overall survival 2.8 years. - Cycle 1 of azacitidine on 07/30/2021.  BMBX (01/04/2022): Overall improvement in blast count and cellularity.  Chromosome analysis and FISH panel normal.  Venetoclax 200 mg day 1 through 14 added with cycle 8 on 02/18/2022. - BMBX (06/05/2022): Slightly hypercellular for age with very mild dyspoietic changes at best associated with increased number of eosinophilic cells.  No increase in blast cells identified. - Azacitidine 5 days and venetoclax 200 mg days 1-14 every 6 weeks on 07/22/2022   2. Social/family history: - He lives at home with his wife.  He swims about 40 minutes daily. 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  He also worked as a Geologist, engineering.  Non-smoker. - Paternal aunt had breast cancer and mother had pancreatic cancer.  3.  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 - He underwent thrombolytic [facility]. - He will followed up with vascular at [facility] [place] and has been on Xarelto since then.    Plan: 1. MDS with EB 2: - He has completed 16  cycles of azacitidine and venetoclax. - Denies any fevers, infections in the last 6 weeks. - Reviewed labs today: Normal LFTs and creatinine.  CBC grossly normal with normal differential.  Hemoglobin 11.6 and stable. - He will proceed with cycle 17 today with azacitidine 75 mg/m x 5 days.  He will take venetoclax 200 mg daily on days 1-14.  We will reevaluate him in 6 weeks for follow-up with repeat labs.  Will consider bone marrow biopsy if there is any significant changes in the blood counts.   2. Unprovoked left subclavian DVT: - Xarelto discontinued due to hemorrhoidal bleeding.  No evidence of recurrence.   3. Constipation: - Continue Colace daily and MiraLAX as needed.  This is well-controlled.    Orders Placed This Encounter  Procedures   Magnesium    Standing Status:   Future    Standing Expiration Date:   05/11/2024   Comprehensive metabolic panel    Standing Status:   Future    Standing Expiration Date:   05/11/2024   CBC with Differential    Standing Status:   Future    Standing Expiration Date:   05/11/2024   Magnesium    Standing Status:   Future    Standing Expiration Date:   06/22/2024   Comprehensive metabolic panel    Standing Status:   Future    Standing Expiration Date:   06/22/2024   CBC with Differential    Standing Status:   Future    Standing Expiration Date:   06/22/2024  I,Katie Daubenspeck,acting as a Neurosurgeon for Doreatha Massed, MD.,have documented all relevant documentation on the behalf of Doreatha Massed, MD,as directed by  Doreatha Massed, MD while in the presence of Doreatha Massed, MD.   I, Doreatha Massed MD, have reviewed the above documentation for accuracy and completeness, and I agree with the above.   Doreatha Massed, MD   12/2/20242:57 PM  CHIEF COMPLAINT:   Diagnosis: MDS with EB 2    Cancer Staging  No matching staging information was found for the patient.    Prior Therapy: none  Current Therapy:   Azacitidine and venetoclax    HISTORY OF PRESENT ILLNESS:   Oncology History  Myelodysplastic syndrome (HCC)  06/17/2021 Initial Diagnosis   Myelodysplastic syndrome (HCC)   07/30/2021 - 10/30/2021 Chemotherapy   Patient is on Treatment Plan : MYELODYSPLASIA  Azacitidine IV D1-7 q28d     07/30/2021 -  Chemotherapy   Patient is on Treatment Plan : MYELODYSPLASIA  Azacitidine IV D1-5 q42d        INTERVAL HISTORY:   Chad Lamb is a 77 y.o. male presenting to clinic today for follow up of MDS with EB 2. He was last seen by me on 01/06/23.  Today, he states that he is doing well overall. His appetite level is at 100%. His energy level is at 50%.  PAST MEDICAL HISTORY:   Past Medical History: Past Medical History:  Diagnosis Date   Allergy    Anxiety    Arthritis    Asthma    Cataract    Clotting disorder (HCC)    DVT (deep venous thrombosis) (HCC)    unknown etiology. [facility]   GERD (gastroesophageal reflux disease)    History of dental surgery    feb 2023   Hyperlipidemia    Hypertension    MDS (myelodysplastic syndrome), high grade (HCC) 06/17/2021   Pneumonia    Port-A-Cath in place 07/30/2021    Surgical History: Past Surgical History:  Procedure Laterality Date   CATARACT EXTRACTION W/PHACO Right 11/24/2017   Procedure: CATARACT EXTRACTION PHACO AND INTRAOCULAR LENS PLACEMENT RIGHT EYE;  Surgeon: Gemma Payor, MD;  Location: [facility];  Service: Ophthalmology;  Laterality: Right;  CDE: 8.61   CATARACT EXTRACTION W/PHACO Left 12/29/2017   Procedure: CATARACT EXTRACTION PHACO AND INTRAOCULAR LENS PLACEMENT (IOC);  Surgeon: Gemma Payor, MD;  Location: [facility];  Service: Ophthalmology;  Laterality: Left;  CDE: 7.21   GANGLION CYST EXCISION     HERNIA REPAIR     bilateral   IR BONE MARROW BIOPSY & ASPIRATION  06/05/2022   PARS PLANA VITRECTOMY Left 05/17/2021   Procedure: PARS PLANA VITRECTOMY 25 GAUGE WITH ANTIBIOTIC INJECTION  AND ENDOLASER LEFT EYE;  Surgeon: Carmela Rima,  MD;  Location: [facility] OR;  Service: Ophthalmology;  Laterality: Left;   PORTACATH PLACEMENT Right 07/09/2021   Procedure: INSERTION PORT-A-CATH;  Surgeon: Franky Macho, MD;  Location: [facility];  Service: General;  Laterality: Right;   TONSILLECTOMY     VASCULAR SURGERY      Social History: Social History   Socioeconomic History   Marital status: Married    Spouse name: Harriett Sine   Number of children: 0   Years of education: 13   Highest education level: High school graduate  Occupational History   Not on file  Tobacco Use   Smoking status: Former    Types: Pipe    Quit date: 03/21/1966    Years since quitting: 56.9   Smokeless tobacco: Never  Vaping Use  Vaping status: Never Used  Substance and Sexual Activity   Alcohol use: Yes    Alcohol/week: 1.0 standard drink of alcohol    Types: 1 Cans of beer per week    Comment: 1 can of beer a week   Drug use: Never   Sexual activity: Not Currently  Other Topics Concern   Not on file  Social History Narrative   ** Merged History Encounter **       Grew up in [place], [place] and [place].  Retired.  Worked in [employer]. 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    Social Determinants of Health   Financial Resource Strain: Low Risk  (01/23/2017)   Overall Financial Resource Strain (CARDIA)    Difficulty of Paying Living Expenses: Not hard at all  Food Insecurity: No Food Insecurity (01/23/2017)   Hunger Vital Sign    Worried About Running Out of Food in the Last Year: Never true    Ran Out of Food in the Last Year: Never true  Transportation Needs: No Transportation Needs (01/23/2017)   PRAPARE - Administrator, Civil Service (Medical): No    Lack of Transportation (Non-Medical): No  Physical Activity: Unknown (01/23/2017)   Exercise Vital Sign    Days of Exercise per Week: 3 days    Minutes of Exercise per Session: Not on file  Stress: Stress Concern Present (01/23/2017)    [employer] of Occupational Health - Occupational Stress Questionnaire    Feeling of Stress : To some extent  Social Connections: Somewhat Isolated (01/23/2017)   Social Connection and Isolation Panel [NHANES]    Frequency of Communication with Friends and Family: More than three times a week    Frequency of Social Gatherings with Friends and Family: More than three times a week    Attends Religious Services: Never    Database administrator or Organizations: No    Attends Banker Meetings: Never    Marital Status: Married  Catering manager Violence: Not At Risk (01/23/2017)   Humiliation, Afraid, Rape, and Kick questionnaire    Fear of Current or Ex-Partner: No    Emotionally Abused: No    Physically Abused: No    Sexually Abused: No    Family History: Family History  Problem Relation Age of Onset   Cancer Mother        pancreatic   Arthritis Father    Early death Father 52       Lung infiltrate   Colon cancer Neg Hx    Colon polyps Neg Hx     Current Medications:  Current Outpatient Medications:    albuterol (PROVENTIL HFA;VENTOLIN HFA) 108 (90 Base) MCG/ACT inhaler, Inhale 1-2 puffs into the lungs every 6 (six) hours as needed for wheezing or shortness of breath., Disp: , Rfl:    ALPRAZolam (XANAX) 0.5 MG tablet, Take 0.5 mg by mouth 2 (two) times daily as needed for anxiety., Disp: , Rfl:    amLODipine (NORVASC) 2.5 MG tablet, Take 2.5 mg by mouth daily., Disp: , Rfl:    azaCITIDine 5 mg/2 mLs in lactated ringers infusion, Inject into the vein daily. Days 1-7 every 28 days, Disp: , Rfl:    DENTA 5000 PLUS 1.1 % CREA dental cream, Take by mouth., Disp: , Rfl:    fluticasone furoate-vilanterol (BREO ELLIPTA) 200-25 MCG/ACT AEPB, Inhale 1 puff into the lungs daily., Disp: , Rfl:    Lactulose 20 GM/30ML SOLN,  Take 30 mLs (20 g total) by mouth at bedtime., Disp: 473 mL, Rfl: 3   Lidocaine-Hydrocort, Perianal, 3-0.5 % CREA, Apply topically 2 (two) times daily as  needed., Disp: , Rfl:    Lidocaine-Hydrocortisone Ace 3-0.5 % CREA, Apply 1 application topically as directed. (Patient taking differently: Apply 1 application  topically 2 (two) times daily as needed (hemorrhoids).), Disp: 30 Tube, Rfl: 11   lidocaine-prilocaine (EMLA) cream, Apply a SMALL AMOUNT TO port a cath site (DO not RUB in) AND cover with PLASTIC WRAP ONE hour prior TO infusion appointment, Disp: 30 g, Rfl: 3   loratadine (CLARITIN) 10 MG tablet, Take 10 mg by mouth daily., Disp: , Rfl:    Lutein 40 MG CAPS, Take 40 mg by mouth daily., Disp: , Rfl:    Multiple Vitamins-Minerals (CENTRUM ADULTS PO), Take 1 tablet by mouth daily., Disp: , Rfl:    nystatin cream (MYCOSTATIN), Apply 1 application. topically 2 (two) times daily as needed for dry skin., Disp: , Rfl:    ofloxacin (OCUFLOX) 0.3 % ophthalmic solution, Place 1 drop into the left eye See admin instructions. 1 drop to left eye 4 x daily for 7 days after monthly procedure., Disp: , Rfl:    omeprazole (PRILOSEC OTC) 20 MG tablet, Take 5-10 mg by mouth daily., Disp: , Rfl:    sildenafil (VIAGRA) 50 MG tablet, Take 50 mg by mouth daily as needed for erectile dysfunction., Disp: , Rfl:    traMADol (ULTRAM) 50 MG tablet, Take 1 tablet (50 mg total) by mouth every 6 (six) hours as needed., Disp: 15 tablet, Rfl: 0   venetoclax (VENCLEXTA) 100 MG tablet, Take 2 tablets (200 mg total) by mouth daily. Take for 14 days, then hold for 14 days. Repeat every 28 days. Take with a meal and a full glass of water., Disp: 28 tablet, Rfl: 1 No current facility-administered medications for this visit.  Facility-Administered Medications Ordered in Other Visits:    azaCITIDine (VIDAZA) 140 mg in sodium chloride 0.9 % 50 mL chemo infusion, 75 mg/m2 (Treatment Plan Recorded), Intravenous, Once, Doreatha Massed, MD   heparin lock flush 100 unit/mL, 500 Units, Intracatheter, Once PRN, Doreatha Massed, MD   sodium chloride flush (NS) 0.9 % injection 10  mL, 10 mL, Intracatheter, PRN, Doreatha Massed, MD, 10 mL at 06/12/22 1606   Allergies: Allergies  Allergen Reactions   Cephalosporins Itching   Amoxapine And Related Itching and Rash    Has patient had a PCN reaction causing immediate rash, facial/tongue/throat swelling, SOB or lightheadedness with hypotension: No Has patient had a PCN reaction causing severe rash involving mucus membranes or skin necrosis: No Has patient had a PCN reaction that required hospitalization: No Has patient had a PCN reaction occurring within the last 10 years: No If all of the above answers are "NO", then may proceed with Cephalosporin use.     Amoxicillin Rash   Cephalexin Rash   Clindamycin/Lincomycin Itching and Rash   Sulfamethoxazole Itching and Rash   Trimethoprim Rash    REVIEW OF SYSTEMS:   Review of Systems  Constitutional:  Negative for chills, fatigue and fever.  HENT:   Negative for lump/mass, mouth sores, nosebleeds, sore throat and trouble swallowing.   Eyes:  Negative for eye problems.  Respiratory:  Positive for shortness of breath. Negative for cough.   Cardiovascular:  Negative for chest pain, leg swelling and palpitations.  Gastrointestinal:  Positive for constipation. Negative for abdominal pain, diarrhea, nausea and vomiting.  Genitourinary:  Negative for bladder incontinence, difficulty urinating, dysuria, frequency, hematuria and nocturia.   Musculoskeletal:  Negative for arthralgias, back pain, flank pain, myalgias and neck pain.  Skin:  Negative for itching and rash.  Neurological:  Negative for dizziness, headaches and numbness.  Hematological:  Does not bruise/bleed easily.  Psychiatric/Behavioral:  Negative for depression, sleep disturbance and suicidal ideas. The patient is not nervous/anxious.   All other systems reviewed and are negative.    VITALS:   Weight 152 lb 3.2 oz (69 kg).  Wt Readings from Last 3 Encounters:  02/17/23 152 lb 3.2 oz (69 kg)   01/06/23 156 lb 3.2 oz (70.9 kg)  11/25/22 155 lb 6.4 oz (70.5 kg)    Body mass index is 22.15 kg/m.  Performance status (ECOG): 1 - Symptomatic but completely ambulatory  PHYSICAL EXAM:   Physical Exam Vitals and nursing note reviewed. Exam conducted with a chaperone present.  Constitutional:      Appearance: Normal appearance.  Cardiovascular:     Rate and Rhythm: Normal rate and regular rhythm.     Pulses: Normal pulses.     Heart sounds: Normal heart sounds.  Pulmonary:     Effort: Pulmonary effort is normal.     Breath sounds: Normal breath sounds.  Abdominal:     Palpations: Abdomen is soft. There is no hepatomegaly, splenomegaly or mass.     Tenderness: There is no abdominal tenderness.  Musculoskeletal:     Right lower leg: No edema.     Left lower leg: No edema.  Lymphadenopathy:     Cervical: No cervical adenopathy.     Right cervical: No superficial, deep or posterior cervical adenopathy.    Left cervical: No superficial, deep or posterior cervical adenopathy.     Upper Body:     Right upper body: No supraclavicular or axillary adenopathy.     Left upper body: No supraclavicular or axillary adenopathy.  Neurological:     General: No focal deficit present.     Mental Status: He is alert and oriented to person, place, and time.  Psychiatric:        Mood and Affect: Mood normal.        Behavior: Behavior normal.     LABS:      Latest Ref Rng & Units 02/17/2023   12:41 PM 01/06/2023    9:06 AM 11/25/2022    9:59 AM  CBC  WBC 4.0 - 10.5 K/uL 6.3  6.2  6.4   Hemoglobin 13.0 - 17.0 g/dL 40.3  47.4  25.9   Hematocrit 39.0 - 52.0 % 37.6  36.1  37.4   Platelets 150 - 400 K/uL 231  225  218       Latest Ref Rng & Units 02/17/2023   12:41 PM 01/06/2023    9:06 AM 11/25/2022    9:59 AM  CMP  Glucose 70 - 99 mg/dL 563  875  643   BUN 8 - 23 mg/dL 14  11  12    Creatinine 0.61 - 1.24 mg/dL 3.29  5.18  8.41   Sodium 135 - 145 mmol/L 140  138  136   Potassium  3.5 - 5.1 mmol/L 3.6  3.5  3.6   Chloride 98 - 111 mmol/L 107  105  104   CO2 22 - 32 mmol/L 25  26  25    Calcium 8.9 - 10.3 mg/dL 9.7  9.3  9.1   Total Protein 6.5 - 8.1 g/dL 6.3  6.1  6.0  Total Bilirubin <1.2 mg/dL 1.1  1.2  1.2   Alkaline Phos 38 - 126 U/L 73  61  65   AST 15 - 41 U/L 20  20  21    ALT 0 - 44 U/L 16  16  16       No results found for: "CEA1", "CEA" / No results found for: "CEA1", "CEA" No results found for: "PSA1" No results found for: "WUJ811" No results found for: "CAN125"  Lab Results  Component Value Date   TOTALPROTELP 6.3 01/05/2021   ALBUMINELP 3.3 01/05/2021   A1GS 0.3 01/05/2021   A2GS 0.8 01/05/2021   BETS 1.0 01/05/2021   GAMS 1.0 01/05/2021   MSPIKE Not Observed 01/05/2021   SPEI Comment 01/05/2021   No results found for: "TIBC", "FERRITIN", "IRONPCTSAT" Lab Results  Component Value Date   LDH 147 11/20/2021   LDH 143 10/22/2021   LDH 144 10/16/2021     STUDIES:   No results found.

## 2023-02-17 NOTE — Patient Instructions (Signed)
[facility] [place] - A DEPT OF [facility]  Discharge Instructions: Thank you for choosing [facility] to provide your oncology and hematology care.  If you have a lab appointment with the Cancer Center - please note that after April 8th, 2024, all labs will be drawn in the cancer center.  You do not have to check in or register with the main entrance as you have in the past but will complete your check-in in the cancer center.  Wear comfortable clothing and clothing appropriate for easy access to any Portacath or PICC line.   We strive to give you quality time with your provider. You may need to reschedule your appointment if you arrive late (15 or more minutes).  Arriving late affects you and other patients whose appointments are after yours.  Also, if you miss three or more appointments without notifying the office, you may be dismissed from the clinic at the provider's discretion.      For prescription refill requests, have your pharmacy contact our office and allow 72 hours for refills to be completed.    Today you received the following chemotherapy and/or immunotherapy agents Vidaza      To help prevent nausea and vomiting after your treatment, we encourage you to take your nausea medication as directed.  BELOW ARE SYMPTOMS THAT SHOULD BE REPORTED IMMEDIATELY: *FEVER GREATER THAN 100.4 F (38 C) OR HIGHER *CHILLS OR SWEATING *NAUSEA AND VOMITING THAT IS NOT CONTROLLED WITH YOUR NAUSEA MEDICATION *UNUSUAL SHORTNESS OF BREATH *UNUSUAL BRUISING OR BLEEDING *URINARY PROBLEMS (pain or burning when urinating, or frequent urination) *BOWEL PROBLEMS (unusual diarrhea, constipation, pain near the anus) TENDERNESS IN MOUTH AND THROAT WITH OR WITHOUT PRESENCE OF ULCERS (sore throat, sores in mouth, or a toothache) UNUSUAL RASH, SWELLING OR PAIN  UNUSUAL VAGINAL DISCHARGE OR ITCHING   Items with * indicate a potential emergency and should be followed up as  soon as possible or go to the Emergency Department if any problems should occur.  Please show the CHEMOTHERAPY ALERT CARD or IMMUNOTHERAPY ALERT CARD at check-in to the Emergency Department and triage nurse.  Should you have questions after your visit or need to cancel or reschedule your appointment, please contact [facility] [place] - A DEPT OF Eligha Bridegroom [facility] [phone]  and follow the prompts.  Office hours are 8:00 a.m. to 4:30 p.m. Monday - Friday. Please note that voicemails left after 4:00 p.m. may not be returned until the following business day.  We are closed weekends and major holidays. You have access to a nurse at all times for urgent questions. Please call the main number to the clinic [phone] and follow the prompts.  For any non-urgent questions, you may also contact your provider using MyChart. We now offer e-Visits for anyone 72 and older to request care online for non-urgent symptoms. For details visit [email or web address].   Also download the MyChart app! Go to the app store, search "MyChart", open the app, select [place], and log in with your MyChart username and password.

## 2023-02-17 NOTE — Patient Instructions (Signed)

## 2023-02-17 NOTE — Progress Notes (Signed)
 Patient tolerated chemotherapy with no complaints voiced.  Side effects with management reviewed with understanding verbalized.  Port site clean and dry with no bruising or swelling noted at site.  Good blood return noted before and after administration of chemotherapy.   Patient left in satisfactory condition with VSS and no s/s of distress noted.   Cosette Prindle [employer]

## 2023-02-17 NOTE — Progress Notes (Signed)
Patients port flushed without difficulty.  Good blood return noted with no bruising or swelling noted at site.  Patient remains accessed for treatment.  

## 2023-02-17 NOTE — Progress Notes (Signed)
Patient is taking venetoclax as prescribed.  He has not missed any doses and reports no side effects at this time.    Patient has been examined by Dr. Delton Coombes. Vital signs and labs have been reviewed by MD - ANC, Creatinine, LFTs, hemoglobin, and platelets are within treatment parameters per M.D. - pt may proceed with treatment.  Primary RN and pharmacy notified.

## 2023-02-18 ENCOUNTER — Other Ambulatory Visit: Payer: Self-pay

## 2023-02-18 ENCOUNTER — Inpatient Hospital Stay: Payer: Medicare HMO

## 2023-02-18 VITALS — BP 138/88 | HR 69 | Temp 97.8°F | Resp 18

## 2023-02-18 DIAGNOSIS — D469 Myelodysplastic syndrome, unspecified: Secondary | ICD-10-CM

## 2023-02-18 DIAGNOSIS — Z95828 Presence of other vascular implants and grafts: Secondary | ICD-10-CM

## 2023-02-18 DIAGNOSIS — Z5111 Encounter for antineoplastic chemotherapy: Secondary | ICD-10-CM | POA: Diagnosis not present

## 2023-02-18 MED ORDER — SODIUM CHLORIDE 0.9 % IV SOLN
75.0000 mg/m2 | Freq: Once | INTRAVENOUS | Status: AC
  Administered 2023-02-18: 140 mg via INTRAVENOUS
  Filled 2023-02-18: qty 14

## 2023-02-18 MED ORDER — HEPARIN SOD (PORK) LOCK FLUSH 100 UNIT/ML IV SOLN
500.0000 [IU] | Freq: Once | INTRAVENOUS | Status: AC | PRN
  Administered 2023-02-18: 500 [IU]

## 2023-02-18 MED ORDER — DEXAMETHASONE SODIUM PHOSPHATE 10 MG/ML IJ SOLN
10.0000 mg | Freq: Once | INTRAMUSCULAR | Status: AC
  Administered 2023-02-18: 10 mg via INTRAVENOUS
  Filled 2023-02-18: qty 1

## 2023-02-18 MED ORDER — SODIUM CHLORIDE 0.9% FLUSH
10.0000 mL | INTRAVENOUS | Status: DC | PRN
  Administered 2023-02-18: 10 mL

## 2023-02-18 MED ORDER — SODIUM CHLORIDE 0.9 % IV SOLN
Freq: Once | INTRAVENOUS | Status: AC
Start: 2023-02-18 — End: 2023-02-18

## 2023-02-18 NOTE — Patient Instructions (Signed)
[facility] [place] [facility]  Discharge Instructions: Thank you for choosing [facility] to provide your oncology and hematology care.  If you have a lab appointment with the Cancer Center - please note that after April 8th, 2024, all labs will be drawn in the cancer center.  You do not have to check in or register with the main entrance as you have in the past but will complete your check-in in the cancer center.  Wear comfortable clothing and clothing appropriate for easy access to any Portacath or PICC line.   We strive to give you quality time with your provider. You may need to reschedule your appointment if you arrive late (15 or more minutes).  Arriving late affects you and other patients whose appointments are after yours.  Also, if you miss three or more appointments without notifying the office, you may be dismissed from the clinic at the provider's discretion.      For prescription refill requests, have your pharmacy contact our office and allow 72 hours for refills to be completed.    Today you received the following chemotherapy and/or immunotherapy agents Vidaza.  Azacitidine Injection What is this medication? AZACITIDINE (ay za SITE i deen) treats blood and bone marrow cancers. It works by slowing down the growth of cancer cells. This medicine may be used for other purposes; ask your health care provider or pharmacist if you have questions. COMMON BRAND NAME(S): Vidaza What should I tell my care team before I take this medication? They need to know if you have any of these conditions: Kidney disease Liver disease Low blood cell levels, such as low white cells, platelets, or red blood cells Low levels of albumin in the blood Low levels of bicarbonate in the blood An unusual or allergic reaction to azacitidine, mannitol, other medications, foods, dyes, or preservatives If you or your partner are pregnant or trying to get  pregnant Breast-feeding How should I use this medication? This medication is injected into a vein or under the skin. It is given by your care team in a hospital or clinic setting. Talk to your care team about the use of this medication in children. While it may be prescribed for children as young as 1 month for selected conditions, precautions do apply. Overdosage: If you think you have taken too much of this medicine contact a poison control center or emergency room at once. NOTE: This medicine is only for you. Do not share this medicine with others. What if I miss a dose? Keep appointments for follow-up doses. It is important not to miss your dose. Call your care team if you are unable to keep an appointment. What may interact with this medication? Interactions are not expected. This list may not describe all possible interactions. Give your health care provider a list of all the medicines, herbs, non-prescription drugs, or dietary supplements you use. Also tell them if you smoke, drink alcohol, or use illegal drugs. Some items may interact with your medicine. What should I watch for while using this medication? Your condition will be monitored carefully while you are receiving this medication. You may need blood work while taking this medication. This medication may make you feel generally unwell. This is not uncommon as chemotherapy can affect healthy cells as well as cancer cells. Report any side effects. Continue your course of treatment even though you feel ill unless your care team tells you to stop.  Other product types may be available that contain the medication azacitidine. The injection and oral products should not be used in place of one another. Talk to your care team if you have questions. This medication can cause serious side effects. To reduce the risk, your care team may give you other medications to take before receiving this one. Be sure to follow the directions from your care  team. This medication may increase your risk of getting an infection. Call your care team for advice if you get a fever, chills, sore throat, or other symptoms of a cold or flu. Do not treat yourself. Try to avoid being around people who are sick. Avoid taking medications that contain aspirin, acetaminophen, ibuprofen, naproxen, or ketoprofen unless instructed by your care team. These medications may hide a fever. This medication may increase your risk to bruise or bleed. Call your care team if you notice any unusual bleeding. Be careful brushing or flossing your teeth or using a toothpick because you may get an infection or bleed more easily. If you have any dental work done, tell your dentist you are receiving this medication. Talk to your care team if you or your partner may be pregnant. Serious birth defects can occur if you take this medication during pregnancy and for 6 months after the last dose. You will need a negative pregnancy test before starting this medication. Contraception is recommended while taking his medication and for 6 months after the last dose. Your care team can help you find the option that works for you. If your partner can get pregnant, use a condom during sex while taking this medication and for 3 months after the last dose. Do not breastfeed while taking this medication and for 1 week after the last dose. This medication may cause infertility. Talk to your care team if you are concerned about your fertility. What side effects may I notice from receiving this medication? Side effects that you should report to your care team as soon as possible: Allergic reactions--skin rash, itching, hives, swelling of the face, lips, tongue, or throat Infection--fever, chills, cough, sore throat, wounds that don't heal, pain or trouble when passing urine, general feeling of discomfort or being unwell Kidney injury--decrease in the amount of urine, swelling of the ankles, hands, or  feet Liver injury--right upper belly pain, loss of appetite, nausea, light-colored stool, dark yellow or Chad [name], yellowing skin or eyes, unusual weakness or fatigue Low red blood cell level--unusual weakness or fatigue, dizziness, headache, trouble breathing Tumor lysis syndrome (TLS)--nausea, vomiting, diarrhea, decrease in the amount of urine, dark urine, unusual weakness or fatigue, confusion, muscle pain or cramps, fast or irregular heartbeat, joint pain Unusual bruising or bleeding Side effects that usually do not require medical attention (report to your care team if they continue or are bothersome): Constipation Diarrhea Nausea Pain, redness, or irritation at injection site Vomiting This list may not describe all possible side effects. Call your doctor for medical advice about side effects. You may report side effects to FDA at 1-800-FDA-1088. Where should I keep my medication? This medication is given in a hospital or clinic. It will not be stored at home. NOTE: This sheet is a summary. It may not cover all possible information. If you have questions about this medicine, talk to your doctor, pharmacist, or health care provider.  2024 Elsevier/Gold Standard (2021-07-19 00:00:00)        To help prevent nausea and vomiting after your treatment, we encourage you to take  your nausea medication as directed.  BELOW ARE SYMPTOMS THAT SHOULD BE REPORTED IMMEDIATELY: *FEVER GREATER THAN 100.4 F (38 C) OR HIGHER *CHILLS OR SWEATING *NAUSEA AND VOMITING THAT IS NOT CONTROLLED WITH YOUR NAUSEA MEDICATION *UNUSUAL SHORTNESS OF BREATH *UNUSUAL BRUISING OR BLEEDING *URINARY PROBLEMS (pain or burning when urinating, or frequent urination) *BOWEL PROBLEMS (unusual diarrhea, constipation, pain near the anus) TENDERNESS IN MOUTH AND THROAT WITH OR WITHOUT PRESENCE OF ULCERS (sore throat, sores in mouth, or a toothache) UNUSUAL RASH, SWELLING OR PAIN  UNUSUAL VAGINAL DISCHARGE OR ITCHING    Items with * indicate a potential emergency and should be followed up as soon as possible or go to the Emergency Department if any problems should occur.  Please show the CHEMOTHERAPY ALERT CARD or IMMUNOTHERAPY ALERT CARD at check-in to the Emergency Department and triage nurse.  Should you have questions after your visit or need to cancel or reschedule your appointment, please contact [facility] [place] - A DEPT OF [facility] Brideg[facility] [phone]  and follow the prompts.  Office hours are 8:00 a.m. to 4:30 p.m. Monday - Friday. Please note that voicemails left after 4:00 p.m. may not be returned until the following business day.  We are closed weekends and major holidays. You have access to a nurse at all times for urgent questions. Please call the main number to the clinic [phone] and follow the prompts.  For any non-urgent questions, you may also contact your provider using MyChart. We now offer e-Visits for anyone 44 and older to request care online for non-urgent symptoms. For details visit [email or web address].   Also download the MyChart app! Go to the app store, search "MyChart", open the app, select [place], and log in with your MyChart username and password.

## 2023-02-18 NOTE — Progress Notes (Signed)
Patient presents today for chemotherapy infusion.  Patient is in satisfactory condition with no new complaints voiced.  Vital signs are stable.  Labs from 02/17/23 reviewed by MD.  We will proceed with treatment per MD orders.  Patient tolerated treatment well with no complaints voiced.  Patient left ambulatory in stable condition.  Vital signs stable at discharge.  Follow up as scheduled.

## 2023-02-19 ENCOUNTER — Inpatient Hospital Stay: Payer: Medicare HMO

## 2023-02-19 VITALS — BP 140/80 | HR 64 | Temp 97.8°F | Resp 19

## 2023-02-19 DIAGNOSIS — Z95828 Presence of other vascular implants and grafts: Secondary | ICD-10-CM

## 2023-02-19 DIAGNOSIS — D469 Myelodysplastic syndrome, unspecified: Secondary | ICD-10-CM

## 2023-02-19 DIAGNOSIS — Z5111 Encounter for antineoplastic chemotherapy: Secondary | ICD-10-CM | POA: Diagnosis not present

## 2023-02-19 MED ORDER — SODIUM CHLORIDE 0.9% FLUSH
10.0000 mL | INTRAVENOUS | Status: DC | PRN
  Administered 2023-02-19: 10 mL

## 2023-02-19 MED ORDER — DEXAMETHASONE SODIUM PHOSPHATE 10 MG/ML IJ SOLN
10.0000 mg | Freq: Once | INTRAMUSCULAR | Status: AC
Start: 2023-02-19 — End: 2023-02-19
  Administered 2023-02-19: 10 mg via INTRAVENOUS
  Filled 2023-02-19: qty 1

## 2023-02-19 MED ORDER — HEPARIN SOD (PORK) LOCK FLUSH 100 UNIT/ML IV SOLN
250.0000 [IU] | Freq: Once | INTRAVENOUS | Status: DC | PRN
Start: 2023-02-19 — End: 2023-02-19

## 2023-02-19 MED ORDER — SODIUM CHLORIDE 0.9 % IV SOLN: Freq: Once | INTRAVENOUS | Status: AC

## 2023-02-19 MED ORDER — HEPARIN SOD (PORK) LOCK FLUSH 100 UNIT/ML IV SOLN
500.0000 [IU] | Freq: Once | INTRAVENOUS | Status: AC | PRN
  Administered 2023-02-19: 500 [IU]

## 2023-02-19 MED ORDER — SODIUM CHLORIDE 0.9 % IV SOLN
75.0000 mg/m2 | Freq: Once | INTRAVENOUS | Status: AC
  Administered 2023-02-19: 140 mg via INTRAVENOUS
  Filled 2023-02-19: qty 14

## 2023-02-19 MED ORDER — PALONOSETRON HCL INJECTION 0.25 MG/5ML
0.2500 mg | Freq: Once | INTRAVENOUS | Status: AC
  Administered 2023-02-19: 0.25 mg via INTRAVENOUS
  Filled 2023-02-19: qty 5

## 2023-02-19 MED ORDER — SODIUM CHLORIDE 0.9% FLUSH: 3.0000 mL | INTRAVENOUS | Status: DC | PRN

## 2023-02-19 MED ORDER — ALTEPLASE 2 MG IJ SOLR
2.0000 mg | Freq: Once | INTRAMUSCULAR | Status: DC | PRN
Start: 2023-02-19 — End: 2023-02-19

## 2023-02-19 NOTE — Patient Instructions (Signed)
[facility] [place] [facility]  Discharge Instructions: Thank you for choosing [facility] to provide your oncology and hematology care.  If you have a lab appointment with the Cancer Center - please note that after April 8th, 2024, all labs will be drawn in the cancer center.  You do not have to check in or register with the main entrance as you have in the past but will complete your check-in in the cancer center.  Wear comfortable clothing and clothing appropriate for easy access to any Portacath or PICC line.   We strive to give you quality time with your provider. You may need to reschedule your appointment if you arrive late (15 or more minutes).  Arriving late affects you and other patients whose appointments are after yours.  Also, if you miss three or more appointments without notifying the office, you may be dismissed from the clinic at the provider's discretion.      For prescription refill requests, have your pharmacy contact our office and allow 72 hours for refills to be completed.    Today you received the following chemotherapy and/or immunotherapy agents Vidaza.  Azacitidine Injection What is this medication? AZACITIDINE (ay za SITE i deen) treats blood and bone marrow cancers. It works by slowing down the growth of cancer cells. This medicine may be used for other purposes; ask your health care provider or pharmacist if you have questions. COMMON BRAND NAME(S): Vidaza What should I tell my care team before I take this medication? They need to know if you have any of these conditions: Kidney disease Liver disease Low blood cell levels, such as low white cells, platelets, or red blood cells Low levels of albumin in the blood Low levels of bicarbonate in the blood An unusual or allergic reaction to azacitidine, mannitol, other medications, foods, dyes, or preservatives If you or your partner are pregnant or trying to get  pregnant Breast-feeding How should I use this medication? This medication is injected into a vein or under the skin. It is given by your care team in a hospital or clinic setting. Talk to your care team about the use of this medication in children. While it may be prescribed for children as young as 1 month for selected conditions, precautions do apply. Overdosage: If you think you have taken too much of this medicine contact a poison control center or emergency room at once. NOTE: This medicine is only for you. Do not share this medicine with others. What if I miss a dose? Keep appointments for follow-up doses. It is important not to miss your dose. Call your care team if you are unable to keep an appointment. What may interact with this medication? Interactions are not expected. This list may not describe all possible interactions. Give your health care provider a list of all the medicines, herbs, non-prescription drugs, or dietary supplements you use. Also tell them if you smoke, drink alcohol, or use illegal drugs. Some items may interact with your medicine. What should I watch for while using this medication? Your condition will be monitored carefully while you are receiving this medication. You may need blood work while taking this medication. This medication may make you feel generally unwell. This is not uncommon as chemotherapy can affect healthy cells as well as cancer cells. Report any side effects. Continue your course of treatment even though you feel ill unless your care team tells you to stop.  Other product types may be available that contain the medication azacitidine. The injection and oral products should not be used in place of one another. Talk to your care team if you have questions. This medication can cause serious side effects. To reduce the risk, your care team may give you other medications to take before receiving this one. Be sure to follow the directions from your care  team. This medication may increase your risk of getting an infection. Call your care team for advice if you get a fever, chills, sore throat, or other symptoms of a cold or flu. Do not treat yourself. Try to avoid being around people who are sick. Avoid taking medications that contain aspirin, acetaminophen, ibuprofen, naproxen, or ketoprofen unless instructed by your care team. These medications may hide a fever. This medication may increase your risk to bruise or bleed. Call your care team if you notice any unusual bleeding. Be careful brushing or flossing your teeth or using a toothpick because you may get an infection or bleed more easily. If you have any dental work done, tell your dentist you are receiving this medication. Talk to your care team if you or your partner may be pregnant. Serious birth defects can occur if you take this medication during pregnancy and for 6 months after the last dose. You will need a negative pregnancy test before starting this medication. Contraception is recommended while taking his medication and for 6 months after the last dose. Your care team can help you find the option that works for you. If your partner can get pregnant, use a condom during sex while taking this medication and for 3 months after the last dose. Do not breastfeed while taking this medication and for 1 week after the last dose. This medication may cause infertility. Talk to your care team if you are concerned about your fertility. What side effects may I notice from receiving this medication? Side effects that you should report to your care team as soon as possible: Allergic reactions--skin rash, itching, hives, swelling of the face, lips, tongue, or throat Infection--fever, chills, cough, sore throat, wounds that don't heal, pain or trouble when passing urine, general feeling of discomfort or being unwell Kidney injury--decrease in the amount of urine, swelling of the ankles, hands, or  feet Liver injury--right upper belly pain, loss of appetite, nausea, light-colored stool, dark yellow or Jairy Angulo urine, yellowing skin or eyes, unusual weakness or fatigue Low red blood cell level--unusual weakness or fatigue, dizziness, headache, trouble breathing Tumor lysis syndrome (TLS)--nausea, vomiting, diarrhea, decrease in the amount of urine, dark urine, unusual weakness or fatigue, confusion, muscle pain or cramps, fast or irregular heartbeat, joint pain Unusual bruising or bleeding Side effects that usually do not require medical attention (report to your care team if they continue or are bothersome): Constipation Diarrhea Nausea Pain, redness, or irritation at injection site Vomiting This list may not describe all possible side effects. Call your doctor for medical advice about side effects. You may report side effects to FDA at 1-800-FDA-1088. Where should I keep my medication? This medication is given in a hospital or clinic. It will not be stored at home. NOTE: This sheet is a summary. It may not cover all possible information. If you have questions about this medicine, talk to your doctor, pharmacist, or health care provider.  2024 Elsevier/Gold Standard (2021-07-19 00:00:00)        To help prevent nausea and vomiting after your treatment, we encourage you to take  your nausea medication as directed.  BELOW ARE SYMPTOMS THAT SHOULD BE REPORTED IMMEDIATELY: *FEVER GREATER THAN 100.4 F (38 C) OR HIGHER *CHILLS OR SWEATING *NAUSEA AND VOMITING THAT IS NOT CONTROLLED WITH YOUR NAUSEA MEDICATION *UNUSUAL SHORTNESS OF BREATH *UNUSUAL BRUISING OR BLEEDING *URINARY PROBLEMS (pain or burning when urinating, or frequent urination) *BOWEL PROBLEMS (unusual diarrhea, constipation, pain near the anus) TENDERNESS IN MOUTH AND THROAT WITH OR WITHOUT PRESENCE OF ULCERS (sore throat, sores in mouth, or a toothache) UNUSUAL RASH, SWELLING OR PAIN  UNUSUAL VAGINAL DISCHARGE OR ITCHING    Items with * indicate a potential emergency and should be followed up as soon as possible or go to the Emergency Department if any problems should occur.  Please show the CHEMOTHERAPY ALERT CARD or IMMUNOTHERAPY ALERT CARD at check-in to the Emergency Department and triage nurse.  Should you have questions after your visit or need to cancel or reschedule your appointment, please contact [facility] [place] - A DEPT OF [facility] [phone]  and follow the prompts.  Office hours are 8:00 a.m. to 4:30 p.m. Monday - Friday. Please note that voicemails left after 4:00 p.m. may not be returned until the following business day.  We are closed weekends and major holidays. You have access to a nurse at all times for urgent questions. Please call the main number to the clinic [phone] and follow the prompts.  For any non-urgent questions, you may also contact your provider using MyChart. We now offer e-Visits for anyone 44 and older to request care online for non-urgent symptoms. For details visit [email or web address].   Also download the MyChart app! Go to the app store, search "MyChart", open the app, select [place], and log in with your MyChart username and password.

## 2023-02-19 NOTE — Progress Notes (Signed)
Patient presents today for chemotherapy infusion.  Patient is in satisfactory condition with no new complaints voiced.  Vital signs are stable.  Labs from 02/17/23 reviewed by MD.  We will proceed with treatment per MD orders.  Patient tolerated treatment well with no complaints voiced.  Patient left ambulatory in stable condition.  Vital signs stable at discharge.  Follow up as scheduled.

## 2023-02-20 ENCOUNTER — Inpatient Hospital Stay: Payer: Medicare HMO

## 2023-02-20 VITALS — BP 139/87 | HR 71 | Temp 98.2°F | Resp 19 | Wt 154.4 lb

## 2023-02-20 DIAGNOSIS — Z5111 Encounter for antineoplastic chemotherapy: Secondary | ICD-10-CM | POA: Diagnosis not present

## 2023-02-20 DIAGNOSIS — D469 Myelodysplastic syndrome, unspecified: Secondary | ICD-10-CM

## 2023-02-20 DIAGNOSIS — Z95828 Presence of other vascular implants and grafts: Secondary | ICD-10-CM

## 2023-02-20 MED ORDER — SODIUM CHLORIDE 0.9 % IV SOLN: Freq: Once | INTRAVENOUS | Status: AC

## 2023-02-20 MED ORDER — DEXAMETHASONE SODIUM PHOSPHATE 10 MG/ML IJ SOLN
10.0000 mg | Freq: Once | INTRAMUSCULAR | Status: AC
  Administered 2023-02-20: 10 mg via INTRAVENOUS
  Filled 2023-02-20: qty 1

## 2023-02-20 MED ORDER — SODIUM CHLORIDE 0.9% FLUSH
10.0000 mL | INTRAVENOUS | Status: DC | PRN
  Administered 2023-02-20: 10 mL

## 2023-02-20 MED ORDER — HEPARIN SOD (PORK) LOCK FLUSH 100 UNIT/ML IV SOLN
500.0000 [IU] | Freq: Once | INTRAVENOUS | Status: AC | PRN
  Administered 2023-02-20: 500 [IU]

## 2023-02-20 MED ORDER — SODIUM CHLORIDE 0.9 % IV SOLN
75.0000 mg/m2 | Freq: Once | INTRAVENOUS | Status: AC
  Administered 2023-02-20: 140 mg via INTRAVENOUS
  Filled 2023-02-20: qty 14

## 2023-02-20 MED FILL — Azacitidine For Inj 100 MG: INTRAMUSCULAR | Qty: 14 | Status: AC

## 2023-02-20 NOTE — Progress Notes (Signed)
Patient presents today for chemotherapy infusion.  Patient is in satisfactory condition with no new complaints voiced.  Vital signs are stable.  Labs from 02/17/23 reviewed by MD.  We will proceed with treatment per MD orders.  Patient tolerated treatment well with no complaints voiced.  Patient left ambulatory in stable condition.  Vital signs stable at discharge.  Follow up as scheduled.

## 2023-02-20 NOTE — Patient Instructions (Signed)
[facility] [place] [facility]  Discharge Instructions: Thank you for choosing [facility] to provide your oncology and hematology care.  If you have a lab appointment with the Cancer Center - please note that after April 8th, 2024, all labs will be drawn in the cancer center.  You do not have to check in or register with the main entrance as you have in the past but will complete your check-in in the cancer center.  Wear comfortable clothing and clothing appropriate for easy access to any Portacath or PICC line.   We strive to give you quality time with your provider. You may need to reschedule your appointment if you arrive late (15 or more minutes).  Arriving late affects you and other patients whose appointments are after yours.  Also, if you miss three or more appointments without notifying the office, you may be dismissed from the clinic at the provider's discretion.      For prescription refill requests, have your pharmacy contact our office and allow 72 hours for refills to be completed.    Today you received the following chemotherapy and/or immunotherapy agents Vidaza.  Azacitidine Injection What is this medication? AZACITIDINE (ay za SITE i deen) treats blood and bone marrow cancers. It works by slowing down the growth of cancer cells. This medicine may be used for other purposes; ask your health care provider or pharmacist if you have questions. COMMON BRAND NAME(S): Vidaza What should I tell my care team before I take this medication? They need to know if you have any of these conditions: Kidney disease Liver disease Low blood cell levels, such as low white cells, platelets, or red blood cells Low levels of albumin in the blood Low levels of bicarbonate in the blood An unusual or allergic reaction to azacitidine, mannitol, other medications, foods, dyes, or preservatives If you or your partner are pregnant or trying to get  pregnant Breast-feeding How should I use this medication? This medication is injected into a vein or under the skin. It is given by your care team in a hospital or clinic setting. Talk to your care team about the use of this medication in children. While it may be prescribed for children as young as 1 month for selected conditions, precautions do apply. Overdosage: If you think you have taken too much of this medicine contact a poison control center or emergency room at once. NOTE: This medicine is only for you. Do not share this medicine with others. What if I miss a dose? Keep appointments for follow-up doses. It is important not to miss your dose. Call your care team if you are unable to keep an appointment. What may interact with this medication? Interactions are not expected. This list may not describe all possible interactions. Give your health care provider a list of all the medicines, herbs, non-prescription drugs, or dietary supplements you use. Also tell them if you smoke, drink alcohol, or use illegal drugs. Some items may interact with your medicine. What should I watch for while using this medication? Your condition will be monitored carefully while you are receiving this medication. You may need blood work while taking this medication. This medication may make you feel generally unwell. This is not uncommon as chemotherapy can affect healthy cells as well as cancer cells. Report any side effects. Continue your course of treatment even though you feel ill unless your care team tells you to stop.  Other product types may be available that contain the medication azacitidine. The injection and oral products should not be used in place of one another. Talk to your care team if you have questions. This medication can cause serious side effects. To reduce the risk, your care team may give you other medications to take before receiving this one. Be sure to follow the directions from your care  team. This medication may increase your risk of getting an infection. Call your care team for advice if you get a fever, chills, sore throat, or other symptoms of a cold or flu. Do not treat yourself. Try to avoid being around people who are sick. Avoid taking medications that contain aspirin, acetaminophen, ibuprofen, naproxen, or ketoprofen unless instructed by your care team. These medications may hide a fever. This medication may increase your risk to bruise or bleed. Call your care team if you notice any unusual bleeding. Be careful brushing or flossing your teeth or using a toothpick because you may get an infection or bleed more easily. If you have any dental work done, tell your dentist you are receiving this medication. Talk to your care team if you or your partner may be pregnant. Serious birth defects can occur if you take this medication during pregnancy and for 6 months after the last dose. You will need a negative pregnancy test before starting this medication. Contraception is recommended while taking his medication and for 6 months after the last dose. Your care team can help you find the option that works for you. If your partner can get pregnant, use a condom during sex while taking this medication and for 3 months after the last dose. Do not breastfeed while taking this medication and for 1 week after the last dose. This medication may cause infertility. Talk to your care team if you are concerned about your fertility. What side effects may I notice from receiving this medication? Side effects that you should report to your care team as soon as possible: Allergic reactions--skin rash, itching, hives, swelling of the face, lips, tongue, or throat Infection--fever, chills, cough, sore throat, wounds that don't heal, pain or trouble when passing urine, general feeling of discomfort or being unwell Kidney injury--decrease in the amount of urine, swelling of the ankles, hands, or  feet Liver injury--right upper belly pain, loss of appetite, nausea, light-colored stool, dark yellow or Jairy Angulo urine, yellowing skin or eyes, unusual weakness or fatigue Low red blood cell level--unusual weakness or fatigue, dizziness, headache, trouble breathing Tumor lysis syndrome (TLS)--nausea, vomiting, diarrhea, decrease in the amount of urine, dark urine, unusual weakness or fatigue, confusion, muscle pain or cramps, fast or irregular heartbeat, joint pain Unusual bruising or bleeding Side effects that usually do not require medical attention (report to your care team if they continue or are bothersome): Constipation Diarrhea Nausea Pain, redness, or irritation at injection site Vomiting This list may not describe all possible side effects. Call your doctor for medical advice about side effects. You may report side effects to FDA at 1-800-FDA-1088. Where should I keep my medication? This medication is given in a hospital or clinic. It will not be stored at home. NOTE: This sheet is a summary. It may not cover all possible information. If you have questions about this medicine, talk to your doctor, pharmacist, or health care provider.  2024 Elsevier/Gold Standard (2021-07-19 00:00:00)        To help prevent nausea and vomiting after your treatment, we encourage you to take  your nausea medication as directed.  BELOW ARE SYMPTOMS THAT SHOULD BE REPORTED IMMEDIATELY: *FEVER GREATER THAN 100.4 F (38 C) OR HIGHER *CHILLS OR SWEATING *NAUSEA AND VOMITING THAT IS NOT CONTROLLED WITH YOUR NAUSEA MEDICATION *UNUSUAL SHORTNESS OF BREATH *UNUSUAL BRUISING OR BLEEDING *URINARY PROBLEMS (pain or burning when urinating, or frequent urination) *BOWEL PROBLEMS (unusual diarrhea, constipation, pain near the anus) TENDERNESS IN MOUTH AND THROAT WITH OR WITHOUT PRESENCE OF ULCERS (sore throat, sores in mouth, or a toothache) UNUSUAL RASH, SWELLING OR PAIN  UNUSUAL VAGINAL DISCHARGE OR ITCHING    Items with * indicate a potential emergency and should be followed up as soon as possible or go to the Emergency Department if any problems should occur.  Please show the CHEMOTHERAPY ALERT CARD or IMMUNOTHERAPY ALERT CARD at check-in to the Emergency Department and triage nurse.  Should you have questions after your visit or need to cancel or reschedule your appointment, please contact [facility] [place] - A DEPT OF [facility] [phone]  and follow the prompts.  Office hours are 8:00 a.m. to 4:30 p.m. Monday - Friday. Please note that voicemails left after 4:00 p.m. may not be returned until the following business day.  We are closed weekends and major holidays. You have access to a nurse at all times for urgent questions. Please call the main number to the clinic [phone] and follow the prompts.  For any non-urgent questions, you may also contact your provider using MyChart. We now offer e-Visits for anyone 44 and older to request care online for non-urgent symptoms. For details visit [email or web address].   Also download the MyChart app! Go to the app store, search "MyChart", open the app, select [place], and log in with your MyChart username and password.

## 2023-02-21 ENCOUNTER — Inpatient Hospital Stay: Payer: Medicare HMO

## 2023-02-21 VITALS — BP 151/92 | HR 56 | Temp 97.4°F | Resp 19

## 2023-02-21 DIAGNOSIS — D469 Myelodysplastic syndrome, unspecified: Secondary | ICD-10-CM

## 2023-02-21 DIAGNOSIS — Z5111 Encounter for antineoplastic chemotherapy: Secondary | ICD-10-CM | POA: Diagnosis not present

## 2023-02-21 DIAGNOSIS — Z95828 Presence of other vascular implants and grafts: Secondary | ICD-10-CM

## 2023-02-21 MED ORDER — DEXAMETHASONE SODIUM PHOSPHATE 10 MG/ML IJ SOLN
10.0000 mg | Freq: Once | INTRAMUSCULAR | Status: AC
  Administered 2023-02-21: 10 mg via INTRAVENOUS
  Filled 2023-02-21: qty 1

## 2023-02-21 MED ORDER — PALONOSETRON HCL INJECTION 0.25 MG/5ML
0.2500 mg | Freq: Once | INTRAVENOUS | Status: AC
  Administered 2023-02-21: 0.25 mg via INTRAVENOUS
  Filled 2023-02-21: qty 5

## 2023-02-21 MED ORDER — SODIUM CHLORIDE 0.9 % IV SOLN: Freq: Once | INTRAVENOUS | Status: AC

## 2023-02-21 MED ORDER — SODIUM CHLORIDE 0.9 % IV SOLN
75.0000 mg/m2 | Freq: Once | INTRAVENOUS | Status: AC
  Administered 2023-02-21: 140 mg via INTRAVENOUS
  Filled 2023-02-21: qty 14

## 2023-02-21 MED ORDER — HEPARIN SOD (PORK) LOCK FLUSH 100 UNIT/ML IV SOLN
500.0000 [IU] | Freq: Once | INTRAVENOUS | Status: AC | PRN
  Administered 2023-02-21: 500 [IU]

## 2023-02-21 NOTE — Progress Notes (Signed)
Patient tolerated chemotherapy with no complaints voiced.  Side effects with management reviewed with understanding verbalized.  Port site clean and dry with no bruising or swelling noted at site.  Good blood return noted before and after administration of chemotherapy.  Band aid applied.  Patient left in satisfactory condition with VSS and no s/s of distress noted. All follow ups as scheduled.   Chad Lamb [employer]

## 2023-02-21 NOTE — Patient Instructions (Signed)
 [facility] [place] [facility]  Discharge Instructions: Thank you for choosing [facility] to provide your oncology and hematology care.  If you have a lab appointment with the Cancer Center - please note that after April 8th, 2024, all labs will be drawn in the cancer center.  You do not have to check in or register with the main entrance as you have in the past but will complete your check-in in the cancer center.  Wear comfortable clothing and clothing appropriate for easy access to any Portacath or PICC line.   We strive to give you quality time with your provider. You may need to reschedule your appointment if you arrive late (15 or more minutes).  Arriving late affects you and other patients whose appointments are after yours.  Also, if you miss three or more appointments without notifying the office, you may be dismissed from the clinic at the provider's discretion.      For prescription refill requests, have your pharmacy contact our office and allow 72 hours for refills to be completed.    Today you received the following chemotherapy and/or immunotherapy agents vidaza.       To help prevent nausea and vomiting after your treatment, we encourage you to take your nausea medication as directed.  BELOW ARE SYMPTOMS THAT SHOULD BE REPORTED IMMEDIATELY: *FEVER GREATER THAN 100.4 F (38 C) OR HIGHER *CHILLS OR SWEATING *NAUSEA AND VOMITING THAT IS NOT CONTROLLED WITH YOUR NAUSEA MEDICATION *UNUSUAL SHORTNESS OF BREATH *UNUSUAL BRUISING OR BLEEDING *URINARY PROBLEMS (pain or burning when urinating, or frequent urination) *BOWEL PROBLEMS (unusual diarrhea, constipation, pain near the anus) TENDERNESS IN MOUTH AND THROAT WITH OR WITHOUT PRESENCE OF ULCERS (sore throat, sores in mouth, or a toothache) UNUSUAL RASH, SWELLING OR PAIN  UNUSUAL VAGINAL DISCHARGE OR ITCHING   Items with * indicate a potential emergency and should be followed up  as soon as possible or go to the Emergency Department if any problems should occur.  Please show the CHEMOTHERAPY ALERT CARD or IMMUNOTHERAPY ALERT CARD at check-in to the Emergency Department and triage nurse.  Should you have questions after your visit or need to cancel or reschedule your appointment, please contact [facility] [place] - A DEPT OF [facility]ideg[facility] [phone]  and follow the prompts.  Office hours are 8:00 a.m. to 4:30 p.m. Monday - Friday. Please note that voicemails left after 4:00 p.m. may not be returned until the following business day.  We are closed weekends and major holidays. You have access to a nurse at all times for urgent questions. Please call the main number to the clinic [phone] and follow the prompts.  For any non-urgent questions, you may also contact your provider using MyChart. We now offer e-Visits for anyone 8 and older to request care online for non-urgent symptoms. For details visit [email or web address].   Also download the MyChart app! Go to the app store, search "MyChart", open the app, select [place], and log in with your MyChart username and password.

## 2023-03-04 ENCOUNTER — Other Ambulatory Visit: Payer: Self-pay

## 2023-03-05 ENCOUNTER — Other Ambulatory Visit (HOSPITAL_COMMUNITY): Payer: Self-pay | Admitting: Pharmacy Technician

## 2023-03-05 ENCOUNTER — Other Ambulatory Visit: Payer: Self-pay

## 2023-03-05 ENCOUNTER — Other Ambulatory Visit (HOSPITAL_COMMUNITY): Payer: Self-pay

## 2023-03-05 NOTE — Progress Notes (Signed)
Specialty Pharmacy Refill Coordination Note  Chad Lamb is a 77 y.o. male contacted today regarding refills of specialty medication(s) Venetoclax [name]   Patient requested Delivery   Delivery date: 03/25/23   Verified address: [address]  [place]   Medication will be filled on 03/23/22.

## 2023-03-05 NOTE — Progress Notes (Signed)
Specialty Pharmacy Ongoing Clinical Assessment Note  Chad Lamb is a 77 y.o. male who is being followed by the specialty pharmacy service for RxSp Oncology   Patient's specialty medication(s) reviewed today: Venetoclax (VENCLEXTA)   Missed doses in the last 4 weeks: 0   Patient/Caregiver did not have any additional questions or concerns.   Therapeutic benefit summary: Patient is achieving benefit   Adverse events/side effects summary: No adverse events/side effects   Patient's therapy is appropriate to: Continue    Goals Addressed             This Visit's Progress    Stabilization of disease       Patient is on track. Patient will maintain adherence. Per 12/2 provider notes labs mostly within normal limits, hemoglobin remains stable.          Follow up:  6 months  Otto Herb Specialty Pharmacist

## 2023-03-24 ENCOUNTER — Other Ambulatory Visit: Payer: Self-pay

## 2023-03-24 ENCOUNTER — Encounter: Payer: Self-pay | Admitting: Hematology

## 2023-03-29 ENCOUNTER — Other Ambulatory Visit: Payer: Self-pay

## 2023-03-30 NOTE — Progress Notes (Signed)
 [facility] [address] [place] [address]    Clinic Day:  03/31/2023  Referring physician: [name], MD  Patient Care Team: [name], MD as PCP - General (Family Medicine) [name], MD (Family Medicine) Harvey Margo CROME, MD (Inactive) as Consulting Physician (Gastroenterology) Rogers Hai, MD as Medical Oncologist (Hematology)   ASSESSMENT & PLAN:   Assessment: 1. MDS with EB 2: - Seen at the request of Dr. Rolinda for cytopenias - BMBX on 06/04/2021: Hypercellular marrow with dyspoietic changes involving the granulocytic cell line and megakaryocytes.  Myeloblasts 8% on aspirate smears and 10% by flow.  Cytogenetics 58, XY (20).  MDS FISH panel normal. 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 - IPSS-M score of 0.12, moderate high risk.  Leukemia free survival is 2.3 years.  Overall survival 2.8 years. - Cycle 1 of azacitidine  on 07/30/2021.  BMBX (01/04/2022): Overall improvement in blast count and cellularity.  Chromosome analysis and FISH panel normal.  Venetoclax  200 mg day 1 through 14 added with cycle 8 on 02/18/2022. - BMBX (06/05/2022): Slightly hypercellular for age with very mild dyspoietic changes at best associated with increased number of eosinophilic cells.  No increase in blast cells identified. - Azacitidine  5 days and venetoclax  200 mg days 1-14 every 6 weeks on 07/22/2022   2. Social/family history: - He lives at home with his wife.  He swims about 40 minutes daily. 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  He also worked as a GEOLOGIST, ENGINEERING.  Non-smoker. - Paternal aunt had breast cancer and mother had pancreatic cancer.  3.  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 - He underwent thrombolytic therapy at [facility]. - He will followed up with vascular at [facility] and has been on Xarelto  since then.    Plan: 1. MDS with EB 2: - He has completed 17  cycles of azacitidine  and venetoclax . - Denies any fevers or infections in the last 6 weeks. - Had dental extraction done and was given antibiotics prophylactically which he completed on 03/30/2023. - Reviewed labs today: Normal white count and platelet count with hemoglobin 12.1.  LFTs are normal with bilirubin 1.4.  Electrolytes are normal. - Proceed with cycle 18 of azacitidine  75 mg/m days 1-5.  Start venetoclax  200 mg daily today, days 1-14. - RTC 6 weeks for follow-up with repeat labs and treatment.   2. Unprovoked left subclavian DVT: - Xarelto  discontinued due to hemorrhoidal bleeding.  No evidence of recurrence.   3. Constipation: - Continue Colace daily and MiraLAX as needed.  Well-controlled.    No orders of the defined types were placed in this encounter.     I,Katie Daubenspeck,acting as a neurosurgeon for Hai Rogers, MD.,have documented all relevant documentation on the behalf of Hai Rogers, MD,as directed by  Hai Rogers, MD while in the presence of Hai Rogers, MD.   I, Hai Rogers MD, have reviewed the above documentation for accuracy and completeness, and I agree with the above.   Hai Rogers, MD   1/13/20254:00 PM  CHIEF COMPLAINT:   Diagnosis: MDS with EB 2    Cancer Staging  No matching staging information was found for the patient.    Prior Therapy: none  Current Therapy:  Azacitidine  and venetoclax     HISTORY OF PRESENT ILLNESS:   Oncology History  Myelodysplastic syndrome (HCC)  06/17/2021 Initial Diagnosis   Myelodysplastic syndrome (HCC)  07/30/2021 - 10/30/2021 Chemotherapy   Patient is on Treatment Plan : MYELODYSPLASIA  Azacitidine  IV D1-7 q28d     07/30/2021 -  Chemotherapy   Patient is on Treatment Plan : MYELODYSPLASIA  Azacitidine  IV D1-5 q42d        INTERVAL HISTORY:   Chad Lamb is a 78 y.o. male presenting to clinic today for follow up of MDS with EB 2. He was last seen by me on  02/17/23.  Today, he states that he is doing well overall. His appetite level is at 100%. His energy level is at 80%.  PAST MEDICAL HISTORY:   Past Medical History: Past Medical History:  Diagnosis Date   Allergy    Anxiety    Arthritis    Asthma    Cataract    Clotting disorder (HCC)    DVT (deep venous thrombosis) (HCC)    unknown etiology. [facility]   GERD (gastroesophageal reflux disease)    History of dental surgery    feb 2023   Hyperlipidemia    Hypertension    MDS (myelodysplastic syndrome), high grade (HCC) 06/17/2021   Pneumonia    Port-A-Cath in place 07/30/2021    Surgical History: Past Surgical History:  Procedure Laterality Date   CATARACT EXTRACTION W/PHACO Right 11/24/2017   Procedure: CATARACT EXTRACTION PHACO AND INTRAOCULAR LENS PLACEMENT RIGHT EYE;  Surgeon: Perley Hamilton, MD;  Location: [facility];  Service: Ophthalmology;  Laterality: Right;  CDE: 8.61   CATARACT EXTRACTION W/PHACO Left 12/29/2017   Procedure: CATARACT EXTRACTION PHACO AND INTRAOCULAR LENS PLACEMENT (IOC);  Surgeon: Perley Hamilton, MD;  Location: [facility];  Service: Ophthalmology;  Laterality: Left;  CDE: 7.21   GANGLION CYST EXCISION     HERNIA REPAIR     bilateral   IR BONE MARROW BIOPSY & ASPIRATION  06/05/2022   PARS PLANA VITRECTOMY Left 05/17/2021   Procedure: PARS PLANA VITRECTOMY 25 GAUGE WITH ANTIBIOTIC INJECTION  AND ENDOLASER LEFT EYE;  Surgeon: Tobie Baptist, MD;  Location: [facility] OR;  Service: Ophthalmology;  Laterality: Left;   PORTACATH PLACEMENT Right 07/09/2021   Procedure: INSERTION PORT-A-CATH;  Surgeon: Mavis Anes, MD;  Location: [facility];  Service: General;  Laterality: Right;   TONSILLECTOMY     VASCULAR SURGERY      Social History: Social History   Socioeconomic History   Marital status: Married    Spouse name: Chad Lamb   Number of children: 0   Years of education: 13   Highest education level: High school graduate  Occupational History   Not on file  Tobacco Use    Smoking status: Former    Types: Pipe    Quit date: 03/21/1966    Years since quitting: 57.0   Smokeless tobacco: Never  Vaping Use   Vaping status: Never Used  Substance and Sexual Activity   Alcohol use: Yes    Alcohol/week: 1.0 standard drink of alcohol    Types: 1 Cans of beer per week    Comment: 1 can of beer a week   Drug use: Never   Sexual activity: Not Currently  Other Topics Concern   Not on file  Social History Narrative   ** Merged History Encounter **       Grew up in [place], [place]  and [place] .  Retired.  Worked in [employer]. 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    Social Drivers of Health   Financial Resource Strain: Low Risk  (01/23/2017)   Overall Financial  Resource Strain (CARDIA)    Difficulty of Paying Living Expenses: Not hard at all  Food Insecurity: No Food Insecurity (01/23/2017)   Hunger Vital Sign    Worried About Running Out of Food in the Last Year: Never true    Ran Out of Food in the Last Year: Never true  Transportation Needs: No Transportation Needs (01/23/2017)   PRAPARE - Administrator, Civil Service (Medical): No    Lack of Transportation (Non-Medical): No  Physical Activity: Unknown (01/23/2017)   Exercise Vital Sign    Days of Exercise per Week: 3 days    Minutes of Exercise per Session: Not on file  Stress: Stress Concern Present (01/23/2017)   [employer] of Occupational Health - Occupational Stress Questionnaire    Feeling of Stress : To some extent  Social Connections: Somewhat Isolated (01/23/2017)   Social Connection and Isolation Panel [NHANES]    Frequency of Communication with Friends and Family: More than three times a week    Frequency of Social Gatherings with Friends and Family: More than three times a week    Attends Religious Services: Never    Database Administrator or Organizations: No    Attends Banker Meetings: Never    Marital Status:  Married  Catering Manager Violence: Not At Risk (01/23/2017)   Humiliation, Afraid, Rape, and Kick questionnaire    Fear of Current or Ex-Partner: No    Emotionally Abused: No    Physically Abused: No    Sexually Abused: No    Family History: Family History  Problem Relation Age of Onset   Cancer Mother        pancreatic   Arthritis Father    Early death Father 62       Lung infiltrate   Colon cancer Neg Hx    Colon polyps Neg Hx     Current Medications:  Current Outpatient Medications:    albuterol  (PROVENTIL  HFA;VENTOLIN  HFA) 108 (90 Base) MCG/ACT inhaler, Inhale 1-2 puffs into the lungs every 6 (six) hours as needed for wheezing or shortness of breath., Disp: , Rfl:    ALPRAZolam (XANAX) 0.5 MG tablet, Take 0.5 mg by mouth 2 (two) times daily as needed for anxiety., Disp: , Rfl:    amLODipine (NORVASC) 2.5 MG tablet, Take 2.5 mg by mouth daily., Disp: , Rfl:    azaCITIDine  5 mg/2 mLs in lactated ringers  infusion, Inject into the vein daily. Days 1-7 every 28 days, Disp: , Rfl:    DENTA 5000 PLUS 1.1 % CREA dental cream, Take by mouth., Disp: , Rfl:    docusate sodium (COLACE) 100 MG capsule, 1 capsule Orally daily (3 daily when on chemo), Disp: , Rfl:    fluticasone  furoate-vilanterol (BREO ELLIPTA) 200-25 MCG/ACT AEPB, Inhale 1 puff into the lungs daily., Disp: , Rfl:    Lactulose  20 GM/30ML SOLN, Take 30 mLs (20 g total) by mouth at bedtime., Disp: 473 mL, Rfl: 3   Lidocaine -Hydrocort , Perianal, 3-0.5 % CREA, Apply topically 2 (two) times daily as needed., Disp: , Rfl:    Lidocaine -Hydrocortisone  Ace 3-0.5 % CREA, Apply 1 application topically as directed. (Patient taking differently: Apply 1 application  topically 2 (two) times daily as needed (hemorrhoids).), Disp: 30 Tube, Rfl: 11   lidocaine -prilocaine  (EMLA ) cream, Apply a SMALL AMOUNT TO port a cath site (DO not RUB in) AND cover with PLASTIC WRAP ONE hour prior TO infusion appointment, Disp: 30 g, Rfl: 3   loratadine  (CLARITIN)  10 MG tablet, Take 10 mg by mouth daily., Disp: , Rfl:    Lutein 40 MG CAPS, Take 40 mg by mouth daily., Disp: , Rfl:    Multiple Vitamins-Minerals (CENTRUM ADULTS PO), Take 1 tablet by mouth daily., Disp: , Rfl:    nystatin cream (MYCOSTATIN), Apply 1 application. topically 2 (two) times daily as needed for dry skin., Disp: , Rfl:    ofloxacin (OCUFLOX) 0.3 % ophthalmic solution, Place 1 drop into the left eye See admin instructions. 1 drop to left eye 4 x daily for 7 days after monthly procedure., Disp: , Rfl:    omeprazole (PRILOSEC OTC) 20 MG tablet, Take 5-10 mg by mouth daily., Disp: , Rfl:    sildenafil (VIAGRA) 50 MG tablet, Take 50 mg by mouth daily as needed for erectile dysfunction., Disp: , Rfl:    traMADol  (ULTRAM ) 50 MG tablet, Take 1 tablet (50 mg total) by mouth every 6 (six) hours as needed., Disp: 15 tablet, Rfl: 0   venetoclax  (VENCLEXTA ) 100 MG tablet, Take 2 tablets (200 mg total) by mouth daily. Take for 14 days, then hold for 14 days. Repeat every 28 days. Take with a meal and a full glass of water ., Disp: 28 tablet, Rfl: 1 No current facility-administered medications for this visit.  Facility-Administered Medications Ordered in Other Visits:    sodium chloride  flush (NS) 0.9 % injection 10 mL, 10 mL, Intracatheter, PRN, Oswald Pott, MD, 10 mL at 06/12/22 1606   sodium chloride  flush (NS) 0.9 % injection 10 mL, 10 mL, Intracatheter, PRN, Tola Meas, MD, 10 mL at 03/31/23 1323   Allergies: Allergies  Allergen Reactions   Cephalosporins Itching   Amoxapine And Related Itching and Rash    Has patient had a PCN reaction causing immediate rash, facial/tongue/throat swelling, SOB or lightheadedness with hypotension: No Has patient had a PCN reaction causing severe rash involving mucus membranes or skin necrosis: No Has patient had a PCN reaction that required hospitalization: No Has patient had a PCN reaction occurring within the last 10 years:  No If all of the above answers are NO, then may proceed with Cephalosporin use.     Amoxicillin Rash   Cephalexin Rash   Clindamycin/Lincomycin Itching and Rash   Sulfamethoxazole Itching and Rash   Trimethoprim Rash    REVIEW OF SYSTEMS:   Review of Systems  Constitutional:  Negative for chills, fatigue and fever.  HENT:   Negative for lump/mass, mouth sores, nosebleeds, sore throat and trouble swallowing.   Eyes:  Negative for eye problems.  Respiratory:  Negative for cough and shortness of breath.   Cardiovascular:  Negative for chest pain, leg swelling and palpitations.  Gastrointestinal:  Positive for constipation. Negative for abdominal pain, diarrhea, nausea and vomiting.  Genitourinary:  Negative for bladder incontinence, difficulty urinating, dysuria, frequency, hematuria and nocturia.   Musculoskeletal:  Negative for arthralgias, back pain, flank pain, myalgias and neck pain.  Skin:  Negative for itching and rash.  Neurological:  Negative for dizziness, headaches and numbness.  Hematological:  Does not bruise/bleed easily.  Psychiatric/Behavioral:  Negative for depression, sleep disturbance and suicidal ideas. The patient is not nervous/anxious.   All other systems reviewed and are negative.    VITALS:   Blood pressure 139/88, weight 152 lb 9.6 oz (69.2 kg).  Wt Readings from Last 3 Encounters:  03/31/23 152 lb 9.6 oz (69.2 kg)  02/20/23 154 lb 6.4 oz (70 kg)  02/17/23 152 lb 3.2 oz (69 kg)  Body mass index is 22.21 kg/m.  Performance status (ECOG): 1 - Symptomatic but completely ambulatory  PHYSICAL EXAM:   Physical Exam Vitals and nursing note reviewed. Exam conducted with a chaperone present.  Constitutional:      Appearance: Normal appearance.  Cardiovascular:     Rate and Rhythm: Normal rate and regular rhythm.     Pulses: Normal pulses.     Heart sounds: Normal heart sounds.  Pulmonary:     Effort: Pulmonary effort is normal.     Breath  sounds: Normal breath sounds.  Abdominal:     Palpations: Abdomen is soft. There is no hepatomegaly, splenomegaly or mass.     Tenderness: There is no abdominal tenderness.  Musculoskeletal:     Right lower leg: No edema.     Left lower leg: No edema.  Lymphadenopathy:     Cervical: No cervical adenopathy.     Right cervical: No superficial, deep or posterior cervical adenopathy.    Left cervical: No superficial, deep or posterior cervical adenopathy.     Upper Body:     Right upper body: No supraclavicular or axillary adenopathy.     Left upper body: No supraclavicular or axillary adenopathy.  Neurological:     General: No focal deficit present.     Mental Status: He is alert and oriented to person, place, and time.  Psychiatric:        Mood and Affect: Mood normal.        Behavior: Behavior normal.     LABS:   CBC     Component Value Date/Time   WBC 5.6 03/31/2023 1016   RBC 4.70 03/31/2023 1016   HGB 12.1 (L) 03/31/2023 1016   HCT 37.9 (L) 03/31/2023 1016   PLT 225 03/31/2023 1016   MCV 80.6 03/31/2023 1016   MCH 25.7 (L) 03/31/2023 1016   MCHC 31.9 03/31/2023 1016   RDW 17.3 (H) 03/31/2023 1016   LYMPHSABS 1.2 03/31/2023 1016   MONOABS 0.7 03/31/2023 1016   EOSABS 0.4 03/31/2023 1016   BASOSABS 0.1 03/31/2023 1016    CMP      Component Value Date/Time   NA 135 03/31/2023 1016   K 3.7 03/31/2023 1016   CL 105 03/31/2023 1016   CO2 24 03/31/2023 1016   GLUCOSE 129 (H) 03/31/2023 1016   BUN 20 03/31/2023 1016   CREATININE 0.91 03/31/2023 1016   CREATININE 0.98 02/12/2017 1421   CALCIUM 9.5 03/31/2023 1016   PROT 6.3 (L) 03/31/2023 1016   ALBUMIN 3.9 03/31/2023 1016   AST 22 03/31/2023 1016   ALT 15 03/31/2023 1016   ALKPHOS 78 03/31/2023 1016   BILITOT 1.4 (H) 03/31/2023 1016   GFRNONAA >60 03/31/2023 1016   GFRNONAA 77 02/12/2017 1421   GFRAA >60 11/19/2017 1532   GFRAA 89 02/12/2017 1421     No results found for: CEA1, CEA / No results  found for: CEA1, CEA No results found for: PSA1 No results found for: CAN199 No results found for: RJW874  Lab Results  Component Value Date   TOTALPROTELP 6.3 01/05/2021   ALBUMINELP 3.3 01/05/2021   A1GS 0.3 01/05/2021   A2GS 0.8 01/05/2021   BETS 1.0 01/05/2021   GAMS 1.0 01/05/2021   MSPIKE Not Observed 01/05/2021   SPEI Comment 01/05/2021   No results found for: TIBC, FERRITIN, IRONPCTSAT Lab Results  Component Value Date   LDH 147 11/20/2021   LDH 143 10/22/2021   LDH 144 10/16/2021     STUDIES:  No results found.

## 2023-03-31 ENCOUNTER — Inpatient Hospital Stay: Payer: Medicare HMO | Attending: Hematology

## 2023-03-31 ENCOUNTER — Inpatient Hospital Stay (HOSPITAL_BASED_OUTPATIENT_CLINIC_OR_DEPARTMENT_OTHER): Payer: Medicare HMO | Admitting: Hematology

## 2023-03-31 ENCOUNTER — Inpatient Hospital Stay: Payer: Medicare HMO

## 2023-03-31 VITALS — BP 139/88 | Wt 152.6 lb

## 2023-03-31 DIAGNOSIS — D469 Myelodysplastic syndrome, unspecified: Secondary | ICD-10-CM

## 2023-03-31 DIAGNOSIS — Z5111 Encounter for antineoplastic chemotherapy: Secondary | ICD-10-CM | POA: Insufficient documentation

## 2023-03-31 DIAGNOSIS — Z95828 Presence of other vascular implants and grafts: Secondary | ICD-10-CM

## 2023-03-31 DIAGNOSIS — D4622 Refractory anemia with excess of blasts 2: Secondary | ICD-10-CM | POA: Insufficient documentation

## 2023-03-31 LAB — COMPREHENSIVE METABOLIC PANEL
ALT: 15 U/L (ref 0–44)
AST: 22 U/L (ref 15–41)
Albumin: 3.9 g/dL (ref 3.5–5.0)
Alkaline Phosphatase: 78 U/L (ref 38–126)
Anion gap: 6 (ref 5–15)
BUN: 20 mg/dL (ref 8–23)
CO2: 24 mmol/L (ref 22–32)
Calcium: 9.5 mg/dL (ref 8.9–10.3)
Chloride: 105 mmol/L (ref 98–111)
Creatinine, Ser: 0.91 mg/dL (ref 0.61–1.24)
GFR, Estimated: >60 mL/min (ref >60)
Glucose, Bld: 129 mg/dL — ABNORMAL HIGH (ref 70–99)
Potassium: 3.7 mmol/L (ref 3.5–5.1)
Sodium: 135 mmol/L (ref 135–145)
Total Bilirubin: 1.4 mg/dL — ABNORMAL HIGH (ref 0.0–1.2)
Total Protein: 6.3 g/dL — ABNORMAL LOW (ref 6.5–8.1)

## 2023-03-31 LAB — CBC WITH DIFFERENTIAL/PLATELET
Abs Immature Granulocytes: 0.01 10*3/uL (ref 0.00–0.07)
Basophils Absolute: 0.1 10*3/uL (ref 0.0–0.1)
Basophils Relative: 1 %
Eosinophils Absolute: 0.4 10*3/uL (ref 0.0–0.5)
Eosinophils Relative: 7 %
HCT: 37.9 % — ABNORMAL LOW (ref 39.0–52.0)
Hemoglobin: 12.1 g/dL — ABNORMAL LOW (ref 13.0–17.0)
Immature Granulocytes: 0 %
Lymphocytes Relative: 22 %
Lymphs Abs: 1.2 10*3/uL (ref 0.7–4.0)
MCH: 25.7 pg — ABNORMAL LOW (ref 26.0–34.0)
MCHC: 31.9 g/dL (ref 30.0–36.0)
MCV: 80.6 fL (ref 80.0–100.0)
Monocytes Absolute: 0.7 10*3/uL (ref 0.1–1.0)
Monocytes Relative: 13 %
Neutro Abs: 3.2 10*3/uL (ref 1.7–7.7)
Neutrophils Relative %: 57 %
Platelets: 225 10*3/uL (ref 150–400)
RBC: 4.7 MIL/uL (ref 4.22–5.81)
RDW: 17.3 % — ABNORMAL HIGH (ref 11.5–15.5)
WBC: 5.6 10*3/uL (ref 4.0–10.5)
nRBC: 0 % (ref 0.0–0.2)

## 2023-03-31 LAB — MAGNESIUM: Magnesium: 2.1 mg/dL (ref 1.7–2.4)

## 2023-03-31 MED ORDER — SODIUM CHLORIDE 0.9 % IV SOLN
75.0000 mg/m2 | Freq: Once | INTRAVENOUS | Status: AC
  Administered 2023-03-31: 140 mg via INTRAVENOUS
  Filled 2023-03-31: qty 14

## 2023-03-31 MED ORDER — SODIUM CHLORIDE 0.9 % IV SOLN: Freq: Once | INTRAVENOUS | Status: AC

## 2023-03-31 MED ORDER — PALONOSETRON HCL INJECTION 0.25 MG/5ML
0.2500 mg | Freq: Once | INTRAVENOUS | Status: AC
Start: 2023-03-31 — End: 2023-03-31
  Administered 2023-03-31: 0.25 mg via INTRAVENOUS
  Filled 2023-03-31: qty 5

## 2023-03-31 MED ORDER — SODIUM CHLORIDE 0.9% FLUSH
10.0000 mL | INTRAVENOUS | Status: DC | PRN
  Administered 2023-03-31: 10 mL

## 2023-03-31 MED ORDER — HEPARIN SOD (PORK) LOCK FLUSH 100 UNIT/ML IV SOLN
500.0000 [IU] | Freq: Once | INTRAVENOUS | Status: AC | PRN
  Administered 2023-03-31: 500 [IU]

## 2023-03-31 MED ORDER — DEXAMETHASONE SODIUM PHOSPHATE 10 MG/ML IJ SOLN
10.0000 mg | Freq: Once | INTRAMUSCULAR | Status: AC
  Administered 2023-03-31: 10 mg via INTRAVENOUS
  Filled 2023-03-31: qty 1

## 2023-03-31 NOTE — Patient Instructions (Signed)
 [facility] [place] [facility]  Discharge Instructions: Thank you for choosing [facility] to provide your oncology and hematology care.  If you have a lab appointment with the Cancer Center - please note that after April 8th, 2024, all labs will be drawn in the cancer center.  You do not have to check in or register with the main entrance as you have in the past but will complete your check-in in the cancer center.  Wear comfortable clothing and clothing appropriate for easy access to any Portacath or PICC line.   We strive to give you quality time with your provider. You may need to reschedule your appointment if you arrive late (15 or more minutes).  Arriving late affects you and other patients whose appointments are after yours.  Also, if you miss three or more appointments without notifying the office, you may be dismissed from the clinic at the provider's discretion.      For prescription refill requests, have your pharmacy contact our office and allow 72 hours for refills to be completed.    Today you received the following chemotherapy and/or immunotherapy agents Vidaza.  Azacitidine Injection What is this medication? AZACITIDINE (ay za SITE i deen) treats blood and bone marrow cancers. It works by slowing down the growth of cancer cells. This medicine may be used for other purposes; ask your health care provider or pharmacist if you have questions. COMMON BRAND NAME(S): Vidaza What should I tell my care team before I take this medication? They need to know if you have any of these conditions: Kidney disease Liver disease Low blood cell levels, such as low white cells, platelets, or red blood cells Low levels of albumin in the blood Low levels of bicarbonate in the blood An unusual or allergic reaction to azacitidine, mannitol, other medications, foods, dyes, or preservatives If you or your partner are pregnant or trying to get  pregnant Breast-feeding How should I use this medication? This medication is injected into a vein or under the skin. It is given by your care team in a hospital or clinic setting. Talk to your care team about the use of this medication in children. While it may be prescribed for children as young as 1 month for selected conditions, precautions do apply. Overdosage: If you think you have taken too much of this medicine contact a poison control center or emergency room at once. NOTE: This medicine is only for you. Do not share this medicine with others. What if I miss a dose? Keep appointments for follow-up doses. It is important not to miss your dose. Call your care team if you are unable to keep an appointment. What may interact with this medication? Interactions are not expected. This list may not describe all possible interactions. Give your health care provider a list of all the medicines, herbs, non-prescription drugs, or dietary supplements you use. Also tell them if you smoke, drink alcohol, or use illegal drugs. Some items may interact with your medicine. What should I watch for while using this medication? Your condition will be monitored carefully while you are receiving this medication. You may need blood work while taking this medication. This medication may make you feel generally unwell. This is not uncommon as chemotherapy can affect healthy cells as well as cancer cells. Report any side effects. Continue your course of treatment even though you feel ill unless your care team tells you to stop.  Other product types may be available that contain the medication azacitidine. The injection and oral products should not be used in place of one another. Talk to your care team if you have questions. This medication can cause serious side effects. To reduce the risk, your care team may give you other medications to take before receiving this one. Be sure to follow the directions from your care  team. This medication may increase your risk of getting an infection. Call your care team for advice if you get a fever, chills, sore throat, or other symptoms of a cold or flu. Do not treat yourself. Try to avoid being around people who are sick. Avoid taking medications that contain aspirin, acetaminophen, ibuprofen, naproxen, or ketoprofen unless instructed by your care team. These medications may hide a fever. This medication may increase your risk to bruise or bleed. Call your care team if you notice any unusual bleeding. Be careful brushing or flossing your teeth or using a toothpick because you may get an infection or bleed more easily. If you have any dental work done, tell your dentist you are receiving this medication. Talk to your care team if you or your partner may be pregnant. Serious birth defects can occur if you take this medication during pregnancy and for 6 months after the last dose. You will need a negative pregnancy test before starting this medication. Contraception is recommended while taking his medication and for 6 months after the last dose. Your care team can help you find the option that works for you. If your partner can get pregnant, use a condom during sex while taking this medication and for 3 months after the last dose. Do not breastfeed while taking this medication and for 1 week after the last dose. This medication may cause infertility. Talk to your care team if you are concerned about your fertility. What side effects may I notice from receiving this medication? Side effects that you should report to your care team as soon as possible: Allergic reactions--skin rash, itching, hives, swelling of the face, lips, tongue, or throat Infection--fever, chills, cough, sore throat, wounds that don't heal, pain or trouble when passing urine, general feeling of discomfort or being unwell Kidney injury--decrease in the amount of urine, swelling of the ankles, hands, or  feet Liver injury--right upper belly pain, loss of appetite, nausea, light-colored stool, dark yellow or Chad [name], yellowing skin or eyes, unusual weakness or fatigue Low red blood cell level--unusual weakness or fatigue, dizziness, headache, trouble breathing Tumor lysis syndrome (TLS)--nausea, vomiting, diarrhea, decrease in the amount of urine, dark urine, unusual weakness or fatigue, confusion, muscle pain or cramps, fast or irregular heartbeat, joint pain Unusual bruising or bleeding Side effects that usually do not require medical attention (report to your care team if they continue or are bothersome): Constipation Diarrhea Nausea Pain, redness, or irritation at injection site Vomiting This list may not describe all possible side effects. Call your doctor for medical advice about side effects. You may report side effects to FDA at 1-800-FDA-1088. Where should I keep my medication? This medication is given in a hospital or clinic. It will not be stored at home. NOTE: This sheet is a summary. It may not cover all possible information. If you have questions about this medicine, talk to your doctor, pharmacist, or health care provider.  2024 Elsevier/Gold Standard (2021-07-19 00:00:00)        To help prevent nausea and vomiting after your treatment, we encourage you to take  your nausea medication as directed.  BELOW ARE SYMPTOMS THAT SHOULD BE REPORTED IMMEDIATELY: *FEVER GREATER THAN 100.4 F (38 C) OR HIGHER *CHILLS OR SWEATING *NAUSEA AND VOMITING THAT IS NOT CONTROLLED WITH YOUR NAUSEA MEDICATION *UNUSUAL SHORTNESS OF BREATH *UNUSUAL BRUISING OR BLEEDING *URINARY PROBLEMS (pain or burning when urinating, or frequent urination) *BOWEL PROBLEMS (unusual diarrhea, constipation, pain near the anus) TENDERNESS IN MOUTH AND THROAT WITH OR WITHOUT PRESENCE OF ULCERS (sore throat, sores in mouth, or a toothache) UNUSUAL RASH, SWELLING OR PAIN  UNUSUAL VAGINAL DISCHARGE OR ITCHING    Items with * indicate a potential emergency and should be followed up as soon as possible or go to the Emergency Department if any problems should occur.  Please show the CHEMOTHERAPY ALERT CARD or IMMUNOTHERAPY ALERT CARD at check-in to the Emergency Department and triage nurse.  Should you have questions after your visit or need to cancel or reschedule your appointment, please contact [facility] CANCER CTR [place] - A DEPT OF Eligha Bridegroom [facility] [phone]  and follow the prompts.  Office hours are 8:00 a.m. to 4:30 p.m. Monday - Friday. Please note that voicemails left after 4:00 p.m. may not be returned until the following business day.  We are closed weekends and major holidays. You have access to a nurse at all times for urgent questions. Please call the main number to the clinic [phone] and follow the prompts.  For any non-urgent questions, you may also contact your provider using MyChart. We now offer e-Visits for anyone 44 and older to request care online for non-urgent symptoms. For details visit [email or web address].   Also download the MyChart app! Go to the app store, search "MyChart", open the app, select [place], and log in with your MyChart username and password.

## 2023-03-31 NOTE — Progress Notes (Signed)
 Patient presents today for chemotherapy infusion. Patient is in satisfactory condition with no new complaints voiced.  Vital signs are stable.  Labs reviewed by Dr. Rogers during the office visit and all labs are within treatment parameters.  We will proceed with treatment per MD orders.   Patient tolerated treatment well with no complaints voiced.  Patient left ambulatory in stable condition.  Follow up as scheduled.

## 2023-03-31 NOTE — Progress Notes (Signed)
Patient has been examined by Dr. Delton Coombes. Vital signs and labs have been reviewed by MD - ANC, Creatinine, LFTs, hemoglobin, and platelets are within treatment parameters per M.D. - pt may proceed with treatment.  Primary RN and pharmacy notified.   Patient is taking Venetoclax as prescribed.  He has not missed any doses and reports no side effects at this time.

## 2023-03-31 NOTE — Patient Instructions (Signed)

## 2023-04-01 ENCOUNTER — Inpatient Hospital Stay: Payer: Medicare HMO

## 2023-04-01 VITALS — BP 128/81 | HR 69 | Temp 97.7°F | Resp 18

## 2023-04-01 DIAGNOSIS — D469 Myelodysplastic syndrome, unspecified: Secondary | ICD-10-CM

## 2023-04-01 DIAGNOSIS — Z5111 Encounter for antineoplastic chemotherapy: Secondary | ICD-10-CM | POA: Diagnosis not present

## 2023-04-01 DIAGNOSIS — Z95828 Presence of other vascular implants and grafts: Secondary | ICD-10-CM

## 2023-04-01 MED ORDER — SODIUM CHLORIDE 0.9% FLUSH
10.0000 mL | INTRAVENOUS | Status: DC | PRN
  Administered 2023-04-01: 10 mL

## 2023-04-01 MED ORDER — SODIUM CHLORIDE 0.9 % IV SOLN: Freq: Once | INTRAVENOUS | Status: AC

## 2023-04-01 MED ORDER — DEXAMETHASONE SODIUM PHOSPHATE 10 MG/ML IJ SOLN
10.0000 mg | Freq: Once | INTRAMUSCULAR | Status: AC
  Administered 2023-04-01: 10 mg via INTRAVENOUS
  Filled 2023-04-01: qty 1

## 2023-04-01 MED ORDER — SODIUM CHLORIDE 0.9 % IV SOLN
75.0000 mg/m2 | Freq: Once | INTRAVENOUS | Status: AC
  Administered 2023-04-01: 140 mg via INTRAVENOUS
  Filled 2023-04-01: qty 14

## 2023-04-01 MED ORDER — HEPARIN SOD (PORK) LOCK FLUSH 100 UNIT/ML IV SOLN
500.0000 [IU] | Freq: Once | INTRAVENOUS | Status: AC | PRN
  Administered 2023-04-01: 500 [IU]

## 2023-04-01 NOTE — Progress Notes (Signed)
 Patient presents today for chemotherapy infusion.  Patient is in satisfactory condition with no new complaints voiced.  Vital signs are stable.  Labs from 03/31/23 reviewed.  We will proceed with treatment per MD orders.    Patient tolerated treatment well with no complaints voiced.  Patient left ambulatory in stable condition.  Vital signs stable at discharge.  Follow up as scheduled.

## 2023-04-01 NOTE — Patient Instructions (Signed)
 [facility] [place] [facility]  Discharge Instructions: Thank you for choosing [facility] to provide your oncology and hematology care.  If you have a lab appointment with the Cancer Center - please note that after April 8th, 2024, all labs will be drawn in the cancer center.  You do not have to check in or register with the main entrance as you have in the past but will complete your check-in in the cancer center.  Wear comfortable clothing and clothing appropriate for easy access to any Portacath or PICC line.   We strive to give you quality time with your provider. You may need to reschedule your appointment if you arrive late (15 or more minutes).  Arriving late affects you and other patients whose appointments are after yours.  Also, if you miss three or more appointments without notifying the office, you may be dismissed from the clinic at the provider's discretion.      For prescription refill requests, have your pharmacy contact our office and allow 72 hours for refills to be completed.    Today you received the following chemotherapy and/or immunotherapy agents Vidaza.  Azacitidine Injection What is this medication? AZACITIDINE (ay za SITE i deen) treats blood and bone marrow cancers. It works by slowing down the growth of cancer cells. This medicine may be used for other purposes; ask your health care provider or pharmacist if you have questions. COMMON BRAND NAME(S): Vidaza What should I tell my care team before I take this medication? They need to know if you have any of these conditions: Kidney disease Liver disease Low blood cell levels, such as low white cells, platelets, or red blood cells Low levels of albumin in the blood Low levels of bicarbonate in the blood An unusual or allergic reaction to azacitidine, mannitol, other medications, foods, dyes, or preservatives If you or your partner are pregnant or trying to get  pregnant Breast-feeding How should I use this medication? This medication is injected into a vein or under the skin. It is given by your care team in a hospital or clinic setting. Talk to your care team about the use of this medication in children. While it may be prescribed for children as young as 1 month for selected conditions, precautions do apply. Overdosage: If you think you have taken too much of this medicine contact a poison control center or emergency room at once. NOTE: This medicine is only for you. Do not share this medicine with others. What if I miss a dose? Keep appointments for follow-up doses. It is important not to miss your dose. Call your care team if you are unable to keep an appointment. What may interact with this medication? Interactions are not expected. This list may not describe all possible interactions. Give your health care provider a list of all the medicines, herbs, non-prescription drugs, or dietary supplements you use. Also tell them if you smoke, drink alcohol, or use illegal drugs. Some items may interact with your medicine. What should I watch for while using this medication? Your condition will be monitored carefully while you are receiving this medication. You may need blood work while taking this medication. This medication may make you feel generally unwell. This is not uncommon as chemotherapy can affect healthy cells as well as cancer cells. Report any side effects. Continue your course of treatment even though you feel ill unless your care team tells you to stop.  Other product types may be available that contain the medication azacitidine. The injection and oral products should not be used in place of one another. Talk to your care team if you have questions. This medication can cause serious side effects. To reduce the risk, your care team may give you other medications to take before receiving this one. Be sure to follow the directions from your care  team. This medication may increase your risk of getting an infection. Call your care team for advice if you get a fever, chills, sore throat, or other symptoms of a cold or flu. Do not treat yourself. Try to avoid being around people who are sick. Avoid taking medications that contain aspirin, acetaminophen, ibuprofen, naproxen, or ketoprofen unless instructed by your care team. These medications may hide a fever. This medication may increase your risk to bruise or bleed. Call your care team if you notice any unusual bleeding. Be careful brushing or flossing your teeth or using a toothpick because you may get an infection or bleed more easily. If you have any dental work done, tell your dentist you are receiving this medication. Talk to your care team if you or your partner may be pregnant. Serious birth defects can occur if you take this medication during pregnancy and for 6 months after the last dose. You will need a negative pregnancy test before starting this medication. Contraception is recommended while taking his medication and for 6 months after the last dose. Your care team can help you find the option that works for you. If your partner can get pregnant, use a condom during sex while taking this medication and for 3 months after the last dose. Do not breastfeed while taking this medication and for 1 week after the last dose. This medication may cause infertility. Talk to your care team if you are concerned about your fertility. What side effects may I notice from receiving this medication? Side effects that you should report to your care team as soon as possible: Allergic reactions--skin rash, itching, hives, swelling of the face, lips, tongue, or throat Infection--fever, chills, cough, sore throat, wounds that don't heal, pain or trouble when passing urine, general feeling of discomfort or being unwell Kidney injury--decrease in the amount of urine, swelling of the ankles, hands, or  feet Liver injury--right upper belly pain, loss of appetite, nausea, light-colored stool, dark yellow or Chad [name], yellowing skin or eyes, unusual weakness or fatigue Low red blood cell level--unusual weakness or fatigue, dizziness, headache, trouble breathing Tumor lysis syndrome (TLS)--nausea, vomiting, diarrhea, decrease in the amount of urine, dark urine, unusual weakness or fatigue, confusion, muscle pain or cramps, fast or irregular heartbeat, joint pain Unusual bruising or bleeding Side effects that usually do not require medical attention (report to your care team if they continue or are bothersome): Constipation Diarrhea Nausea Pain, redness, or irritation at injection site Vomiting This list may not describe all possible side effects. Call your doctor for medical advice about side effects. You may report side effects to FDA at 1-800-FDA-1088. Where should I keep my medication? This medication is given in a hospital or clinic. It will not be stored at home. NOTE: This sheet is a summary. It may not cover all possible information. If you have questions about this medicine, talk to your doctor, pharmacist, or health care provider.  2024 Elsevier/Gold Standard (2021-07-19 00:00:00)        To help prevent nausea and vomiting after your treatment, we encourage you to take  your nausea medication as directed.  BELOW ARE SYMPTOMS THAT SHOULD BE REPORTED IMMEDIATELY: *FEVER GREATER THAN 100.4 F (38 C) OR HIGHER *CHILLS OR SWEATING *NAUSEA AND VOMITING THAT IS NOT CONTROLLED WITH YOUR NAUSEA MEDICATION *UNUSUAL SHORTNESS OF BREATH *UNUSUAL BRUISING OR BLEEDING *URINARY PROBLEMS (pain or burning when urinating, or frequent urination) *BOWEL PROBLEMS (unusual diarrhea, constipation, pain near the anus) TENDERNESS IN MOUTH AND THROAT WITH OR WITHOUT PRESENCE OF ULCERS (sore throat, sores in mouth, or a toothache) UNUSUAL RASH, SWELLING OR PAIN  UNUSUAL VAGINAL DISCHARGE OR ITCHING    Items with * indicate a potential emergency and should be followed up as soon as possible or go to the Emergency Department if any problems should occur.  Please show the CHEMOTHERAPY ALERT CARD or IMMUNOTHERAPY ALERT CARD at check-in to the Emergency Department and triage nurse.  Should you have questions after your visit or need to cancel or reschedule your appointment, please contact [facility] CANCER CTR [place] - A DEPT OF Eligha Bridegroom [facility] [phone]  and follow the prompts.  Office hours are 8:00 a.m. to 4:30 p.m. Monday - Friday. Please note that voicemails left after 4:00 p.m. may not be returned until the following business day.  We are closed weekends and major holidays. You have access to a nurse at all times for urgent questions. Please call the main number to the clinic [phone] and follow the prompts.  For any non-urgent questions, you may also contact your provider using MyChart. We now offer e-Visits for anyone 44 and older to request care online for non-urgent symptoms. For details visit [email or web address].   Also download the MyChart app! Go to the app store, search "MyChart", open the app, select [place], and log in with your MyChart username and password.

## 2023-04-02 ENCOUNTER — Inpatient Hospital Stay: Payer: Medicare HMO

## 2023-04-02 VITALS — BP 117/69 | HR 60 | Temp 97.8°F | Resp 19 | Ht 69.0 in

## 2023-04-02 DIAGNOSIS — Z5111 Encounter for antineoplastic chemotherapy: Secondary | ICD-10-CM | POA: Diagnosis not present

## 2023-04-02 DIAGNOSIS — Z95828 Presence of other vascular implants and grafts: Secondary | ICD-10-CM

## 2023-04-02 DIAGNOSIS — D469 Myelodysplastic syndrome, unspecified: Secondary | ICD-10-CM

## 2023-04-02 MED ORDER — SODIUM CHLORIDE 0.9% FLUSH
10.0000 mL | INTRAVENOUS | Status: DC | PRN
  Administered 2023-04-02: 10 mL

## 2023-04-02 MED ORDER — PALONOSETRON HCL INJECTION 0.25 MG/5ML
0.2500 mg | Freq: Once | INTRAVENOUS | Status: AC
  Administered 2023-04-02: 0.25 mg via INTRAVENOUS
  Filled 2023-04-02: qty 5

## 2023-04-02 MED ORDER — SODIUM CHLORIDE 0.9 % IV SOLN: Freq: Once | INTRAVENOUS | Status: AC

## 2023-04-02 MED ORDER — HEPARIN SOD (PORK) LOCK FLUSH 100 UNIT/ML IV SOLN
500.0000 [IU] | Freq: Once | INTRAVENOUS | Status: AC | PRN
  Administered 2023-04-02: 500 [IU]

## 2023-04-02 MED ORDER — DEXAMETHASONE SODIUM PHOSPHATE 10 MG/ML IJ SOLN
10.0000 mg | Freq: Once | INTRAMUSCULAR | Status: AC
  Administered 2023-04-02: 10 mg via INTRAVENOUS
  Filled 2023-04-02: qty 1

## 2023-04-02 MED ORDER — SODIUM CHLORIDE 0.9 % IV SOLN
75.0000 mg/m2 | Freq: Once | INTRAVENOUS | Status: AC
  Administered 2023-04-02: 140 mg via INTRAVENOUS
  Filled 2023-04-02: qty 14

## 2023-04-02 NOTE — Patient Instructions (Signed)
 [facility] [place] [facility]  Discharge Instructions: Thank you for choosing [facility] to provide your oncology and hematology care.  If you have a lab appointment with the Cancer Center - please note that after April 8th, 2024, all labs will be drawn in the cancer center.  You do not have to check in or register with the main entrance as you have in the past but will complete your check-in in the cancer center.  Wear comfortable clothing and clothing appropriate for easy access to any Portacath or PICC line.   We strive to give you quality time with your provider. You may need to reschedule your appointment if you arrive late (15 or more minutes).  Arriving late affects you and other patients whose appointments are after yours.  Also, if you miss three or more appointments without notifying the office, you may be dismissed from the clinic at the provider's discretion.      For prescription refill requests, have your pharmacy contact our office and allow 72 hours for refills to be completed.    Today you received the following chemotherapy and/or immunotherapy agents Vidaza, return as scheduled.   To help prevent nausea and vomiting after your treatment, we encourage you to take your nausea medication as directed.  BELOW ARE SYMPTOMS THAT SHOULD BE REPORTED IMMEDIATELY: *FEVER GREATER THAN 100.4 F (38 C) OR HIGHER *CHILLS OR SWEATING *NAUSEA AND VOMITING THAT IS NOT CONTROLLED WITH YOUR NAUSEA MEDICATION *UNUSUAL SHORTNESS OF BREATH *UNUSUAL BRUISING OR BLEEDING *URINARY PROBLEMS (pain or burning when urinating, or frequent urination) *BOWEL PROBLEMS (unusual diarrhea, constipation, pain near the anus) TENDERNESS IN MOUTH AND THROAT WITH OR WITHOUT PRESENCE OF ULCERS (sore throat, sores in mouth, or a toothache) UNUSUAL RASH, SWELLING OR PAIN  UNUSUAL VAGINAL DISCHARGE OR ITCHING   Items with * indicate a potential emergency and  should be followed up as soon as possible or go to the Emergency Department if any problems should occur.  Please show the CHEMOTHERAPY ALERT CARD or IMMUNOTHERAPY ALERT CARD at check-in to the Emergency Department and triage nurse.  Should you have questions after your visit or need to cancel or reschedule your appointment, please contact [facility]  - A DEPT OF Eligha Bridegroom [facility] [phone]  and follow the prompts.  Office hours are 8:00 a.m. to 4:30 p.m. Monday - Friday. Please note that voicemails left after 4:00 p.m. may not be returned until the following business day.  We are closed weekends and major holidays. You have access to a nurse at all times for urgent questions. Please call the main number to the clinic [phone] and follow the prompts.  For any non-urgent questions, you may also contact your provider using MyChart. We now offer e-Visits for anyone 68 and older to request care online for non-urgent symptoms. For details visit [email or web address].   Also download the MyChart app! Go to the app store, search "MyChart", open the app, select [place], and log in with your MyChart username and password.

## 2023-04-02 NOTE — Progress Notes (Signed)
Patient tolerated chemotherapy with no complaints voiced. Side effects with management reviewed understanding verbalized. Port site clean and dry with no bruising or swelling noted at site. Good blood return noted before and after administration of chemotherapy. Band aid applied. Patient left in satisfactory condition with VSS and no s/s of distress noted. 

## 2023-04-03 ENCOUNTER — Inpatient Hospital Stay: Payer: Medicare HMO

## 2023-04-03 VITALS — BP 122/81 | HR 58 | Temp 97.2°F | Resp 18

## 2023-04-03 DIAGNOSIS — D469 Myelodysplastic syndrome, unspecified: Secondary | ICD-10-CM

## 2023-04-03 DIAGNOSIS — Z5111 Encounter for antineoplastic chemotherapy: Secondary | ICD-10-CM | POA: Diagnosis not present

## 2023-04-03 DIAGNOSIS — Z95828 Presence of other vascular implants and grafts: Secondary | ICD-10-CM

## 2023-04-03 MED ORDER — SODIUM CHLORIDE 0.9% FLUSH
10.0000 mL | INTRAVENOUS | Status: DC | PRN
  Administered 2023-04-03: 10 mL

## 2023-04-03 MED ORDER — SODIUM CHLORIDE 0.9 % IV SOLN: Freq: Once | INTRAVENOUS | Status: AC

## 2023-04-03 MED ORDER — DEXAMETHASONE SODIUM PHOSPHATE 10 MG/ML IJ SOLN
10.0000 mg | Freq: Once | INTRAMUSCULAR | Status: AC
  Administered 2023-04-03: 10 mg via INTRAVENOUS
  Filled 2023-04-03: qty 1

## 2023-04-03 MED ORDER — HEPARIN SOD (PORK) LOCK FLUSH 100 UNIT/ML IV SOLN
500.0000 [IU] | Freq: Once | INTRAVENOUS | Status: AC | PRN
Start: 2023-04-03 — End: 2023-04-03
  Administered 2023-04-03: 500 [IU]

## 2023-04-03 MED ORDER — SODIUM CHLORIDE 0.9 % IV SOLN
75.0000 mg/m2 | Freq: Once | INTRAVENOUS | Status: AC
  Administered 2023-04-03: 140 mg via INTRAVENOUS
  Filled 2023-04-03: qty 14

## 2023-04-03 MED FILL — Azacitidine For Inj 100 MG: INTRAMUSCULAR | Qty: 14 | Status: AC

## 2023-04-03 NOTE — Progress Notes (Signed)
Patient tolerated chemotherapy with no complaints voiced. Side effects with management reviewed understanding verbalized. Port site clean and dry with no bruising or swelling noted at site. Good blood return noted before and after administration of chemotherapy. Band aid applied. Patient left in satisfactory condition with VSS and no s/s of distress noted. 

## 2023-04-03 NOTE — Patient Instructions (Signed)
 [facility] [place] [facility]  Discharge Instructions: Thank you for choosing [facility] to provide your oncology and hematology care.  If you have a lab appointment with the Cancer Center - please note that after April 8th, 2024, all labs will be drawn in the cancer center.  You do not have to check in or register with the main entrance as you have in the past but will complete your check-in in the cancer center.  Wear comfortable clothing and clothing appropriate for easy access to any Portacath or PICC line.   We strive to give you quality time with your provider. You may need to reschedule your appointment if you arrive late (15 or more minutes).  Arriving late affects you and other patients whose appointments are after yours.  Also, if you miss three or more appointments without notifying the office, you may be dismissed from the clinic at the provider's discretion.      For prescription refill requests, have your pharmacy contact our office and allow 72 hours for refills to be completed.    Today you received the following chemotherapy and/or immunotherapy agents Vidaza, return as scheduled.   To help prevent nausea and vomiting after your treatment, we encourage you to take your nausea medication as directed.  BELOW ARE SYMPTOMS THAT SHOULD BE REPORTED IMMEDIATELY: *FEVER GREATER THAN 100.4 F (38 C) OR HIGHER *CHILLS OR SWEATING *NAUSEA AND VOMITING THAT IS NOT CONTROLLED WITH YOUR NAUSEA MEDICATION *UNUSUAL SHORTNESS OF BREATH *UNUSUAL BRUISING OR BLEEDING *URINARY PROBLEMS (pain or burning when urinating, or frequent urination) *BOWEL PROBLEMS (unusual diarrhea, constipation, pain near the anus) TENDERNESS IN MOUTH AND THROAT WITH OR WITHOUT PRESENCE OF ULCERS (sore throat, sores in mouth, or a toothache) UNUSUAL RASH, SWELLING OR PAIN  UNUSUAL VAGINAL DISCHARGE OR ITCHING   Items with * indicate a potential emergency and  should be followed up as soon as possible or go to the Emergency Department if any problems should occur.  Please show the CHEMOTHERAPY ALERT CARD or IMMUNOTHERAPY ALERT CARD at check-in to the Emergency Department and triage nurse.  Should you have questions after your visit or need to cancel or reschedule your appointment, please contact [facility]  - A DEPT OF Eligha Bridegroom [facility] [phone]  and follow the prompts.  Office hours are 8:00 a.m. to 4:30 p.m. Monday - Friday. Please note that voicemails left after 4:00 p.m. may not be returned until the following business day.  We are closed weekends and major holidays. You have access to a nurse at all times for urgent questions. Please call the main number to the clinic [phone] and follow the prompts.  For any non-urgent questions, you may also contact your provider using MyChart. We now offer e-Visits for anyone 68 and older to request care online for non-urgent symptoms. For details visit [email or web address].   Also download the MyChart app! Go to the app store, search "MyChart", open the app, select [place], and log in with your MyChart username and password.

## 2023-04-04 ENCOUNTER — Inpatient Hospital Stay: Payer: Medicare HMO

## 2023-04-04 VITALS — BP 131/86 | HR 74 | Temp 97.8°F | Resp 18

## 2023-04-04 DIAGNOSIS — D469 Myelodysplastic syndrome, unspecified: Secondary | ICD-10-CM

## 2023-04-04 DIAGNOSIS — Z95828 Presence of other vascular implants and grafts: Secondary | ICD-10-CM

## 2023-04-04 DIAGNOSIS — Z5111 Encounter for antineoplastic chemotherapy: Secondary | ICD-10-CM | POA: Diagnosis not present

## 2023-04-04 MED ORDER — SODIUM CHLORIDE 0.9 % IV SOLN: Freq: Once | INTRAVENOUS | Status: AC

## 2023-04-04 MED ORDER — HEPARIN SOD (PORK) LOCK FLUSH 100 UNIT/ML IV SOLN
500.0000 [IU] | Freq: Once | INTRAVENOUS | Status: AC | PRN
  Administered 2023-04-04: 500 [IU]

## 2023-04-04 MED ORDER — SODIUM CHLORIDE 0.9 % IV SOLN
75.0000 mg/m2 | Freq: Once | INTRAVENOUS | Status: AC
  Administered 2023-04-04: 140 mg via INTRAVENOUS
  Filled 2023-04-04: qty 14

## 2023-04-04 MED ORDER — DEXAMETHASONE SODIUM PHOSPHATE 10 MG/ML IJ SOLN
10.0000 mg | Freq: Once | INTRAMUSCULAR | Status: AC
  Administered 2023-04-04: 10 mg via INTRAVENOUS
  Filled 2023-04-04: qty 1

## 2023-04-04 MED ORDER — PALONOSETRON HCL INJECTION 0.25 MG/5ML
0.2500 mg | Freq: Once | INTRAVENOUS | Status: AC
  Administered 2023-04-04: 0.25 mg via INTRAVENOUS
  Filled 2023-04-04: qty 5

## 2023-04-04 MED ORDER — SODIUM CHLORIDE 0.9% FLUSH
10.0000 mL | INTRAVENOUS | Status: DC | PRN
Start: 2023-04-04 — End: 2023-04-04
  Administered 2023-04-04: 10 mL

## 2023-04-04 NOTE — Patient Instructions (Signed)
[facility] [place] - A DE[facility]  Discharge Instructions: Thank you for choosing [facility] to provide your oncology and hematology care.  If you have a lab appointment with the Cancer Center - please note that after April 8th, 2024, all labs will be drawn in the cancer center.  You do not have to check in or register with the main entrance as you have in the past but will complete your check-in in the cancer center.  Wear comfortable clothing and clothing appropriate for easy access to any Portacath or PICC line.   We strive to give you quality time with your provider. You may need to reschedule your appointment if you arrive late (15 or more minutes).  Arriving late affects you and other patients whose appointments are after yours.  Also, if you miss three or more appointments without notifying the office, you may be dismissed from the clinic at the provider's discretion.      For prescription refill requests, have your pharmacy contact our office and allow 72 hours for refills to be completed.    Today you received the following:  Vidaza.  Azacitidine Injection What is this medication? AZACITIDINE (ay za SITE i deen) treats blood and bone marrow cancers. It works by slowing down the growth of cancer cells. This medicine may be used for other purposes; ask your health care provider or pharmacist if you have questions. COMMON BRAND NAME(S): Vidaza What should I tell my care team before I take this medication? They need to know if you have any of these conditions: Kidney disease Liver disease Low blood cell levels, such as low white cells, platelets, or red blood cells Low levels of albumin in the blood Low levels of bicarbonate in the blood An unusual or allergic reaction to azacitidine, mannitol, other medications, foods, dyes, or preservatives If you or your partner are pregnant or trying to get pregnant Breast-feeding How should I use  this medication? This medication is injected into a vein or under the skin. It is given by your care team in a hospital or clinic setting. Talk to your care team about the use of this medication in children. While it may be prescribed for children as young as 1 month for selected conditions, precautions do apply. Overdosage: If you think you have taken too much of this medicine contact a poison control center or emergency room at once. NOTE: This medicine is only for you. Do not share this medicine with others. What if I miss a dose? Keep appointments for follow-up doses. It is important not to miss your dose. Call your care team if you are unable to keep an appointment. What may interact with this medication? Interactions are not expected. This list may not describe all possible interactions. Give your health care provider a list of all the medicines, herbs, non-prescription drugs, or dietary supplements you use. Also tell them if you smoke, drink alcohol, or use illegal drugs. Some items may interact with your medicine. What should I watch for while using this medication? Your condition will be monitored carefully while you are receiving this medication. This medication may make you feel generally unwell. This is not uncommon as chemotherapy can affect healthy cells as well as cancer cells. Report any side effects. Continue your course of treatment even though you feel ill unless your care team tells you to stop. You may need blood work done while you are taking this medication.  Other product types may be available that contain the medication azacitidine. The injection and oral products should not be used in place of one another. Talk to your care team if you have questions. This medication can cause serious side effects. To reduce the risk, your care team may give you other medications to take before receiving this one. Be sure to follow the directions from your care team. This medication may  increase your risk of getting an infection. Call your care team for advice if you get a fever, chills, sore throat, or other symptoms of a cold or flu. Do not treat yourself. Try to avoid being around people who are sick. Avoid taking medications that contain aspirin, acetaminophen, ibuprofen, naproxen, or ketoprofen unless instructed by your care team. These medications may hide a fever. Be careful brushing or flossing your teeth or using a toothpick because you may get an infection or bleed more easily. If you have any dental work done, tell your dentist you are receiving this medication. Talk to your care team if you or your partner may be pregnant. Serious birth defects can occur if you take this medication during pregnancy and for 6 months after the last dose. You will need a negative pregnancy test before starting this medication. Contraception is recommended while taking this medication and for 6 months after the last dose. Your care team can help you find the option that works for you. If your partner can get pregnant, use a condom during sex while taking this medication and for 3 months after the last dose. Do not breastfeed while taking this medication and for 1 week after the last dose. This medication may cause infertility. Talk to your care team if you are concerned about your fertility. What side effects may I notice from receiving this medication? Side effects that you should report to your care team as soon as possible: Allergic reactions--skin rash, itching, hives, swelling of the face, lips, tongue, or throat Infection--fever, chills, cough, sore throat, wounds that don't heal, pain or trouble when passing urine, general feeling of discomfort or being unwell Kidney injury--decrease in the amount of urine, swelling of the ankles, hands, or feet Liver injury--right upper belly pain, loss of appetite, nausea, light-colored stool, dark yellow or Amorina Doerr urine, yellowing skin or eyes, unusual  weakness or fatigue Low red blood cell level--unusual weakness or fatigue, dizziness, headache, trouble breathing Tumor lysis syndrome (TLS)--nausea, vomiting, diarrhea, decrease in the amount of urine, dark urine, unusual weakness or fatigue, confusion, muscle pain or cramps, fast or irregular heartbeat, joint pain Unusual bruising or bleeding Side effects that usually do not require medical attention (report to your care team if they continue or are bothersome): Constipation Diarrhea Nausea Pain, redness, or irritation at injection site Vomiting This list may not describe all possible side effects. Call your doctor for medical advice about side effects. You may report side effects to FDA at 1-800-FDA-1088. Where should I keep my medication? This medication is given in a hospital or clinic. It will not be stored at home. NOTE: This sheet is a summary. It may not cover all possible information. If you have questions about this medicine, talk to your doctor, pharmacist, or health care provider.  2024 Elsevier/Gold Standard (2022-11-04 00:00:00)    To help prevent nausea and vomiting after your treatment, we encourage you to take your nausea medication as directed.  BELOW ARE SYMPTOMS THAT SHOULD BE REPORTED IMMEDIATELY: *FEVER GREATER THAN 100.4 F (38 C) OR HIGHER *CHILLS  OR SWEATING *NAUSEA AND VOMITING THAT IS NOT CONTROLLED WITH YOUR NAUSEA MEDICATION *UNUSUAL SHORTNESS OF BREATH *UNUSUAL BRUISING OR BLEEDING *URINARY PROBLEMS (pain or burning when urinating, or frequent urination) *BOWEL PROBLEMS (unusual diarrhea, constipation, pain near the anus) TENDERNESS IN MOUTH AND THROAT WITH OR WITHOUT PRESENCE OF ULCERS (sore throat, sores in mouth, or a toothache) UNUSUAL RASH, SWELLING OR PAIN  UNUSUAL VAGINAL DISCHARGE OR ITCHING   Items with * indicate a potential emergency and should be followed up as soon as possible or go to the Emergency Department if any problems should  occur.  Please show the CHEMOTHERAPY ALERT CARD or IMMUNOTHERAPY ALERT CARD at check-in to the Emergency Department and triage nurse.  Should you have questions after your visit or need to cancel or reschedule your appointment, please contact [facility] CANCER CTR [place] - A DEPT OF Eligha Bridegroom [facility] [phone]  and follow the prompts.  Office hours are 8:00 a.m. to 4:30 p.m. Monday - Friday. Please note that voicemails left after 4:00 p.m. may not be returned until the following business day.  We are closed weekends and major holidays. You have access to a nurse at all times for urgent questions. Please call the main number to the clinic [phone] and follow the prompts.  For any non-urgent questions, you may also contact your provider using MyChart. We now offer e-Visits for anyone 58 and older to request care online for non-urgent symptoms. For details visit [email or web address].   Also download the MyChart app! Go to the app store, search "MyChart", open the app, select [place], and log in with your MyChart username and password.

## 2023-04-04 NOTE — Progress Notes (Signed)
Patient presents today for Vidaza infusion.  Patient is in satisfactory condition with no new complaints voiced.  Vital signs are stable.   Labs from 03/31/23 reviewed by MD.  We will proceed with treatment per MD orders.    Patient tolerated infusion well with no complaints voiced.  Patient left ambulatory in stable condition.  Vital signs stable at discharge.  Follow up as scheduled.

## 2023-04-14 ENCOUNTER — Encounter: Payer: Self-pay | Admitting: Hematology

## 2023-04-17 ENCOUNTER — Other Ambulatory Visit (HOSPITAL_COMMUNITY): Payer: Self-pay

## 2023-04-21 ENCOUNTER — Other Ambulatory Visit: Payer: Self-pay | Admitting: Hematology

## 2023-04-21 ENCOUNTER — Other Ambulatory Visit: Payer: Self-pay

## 2023-04-21 DIAGNOSIS — D469 Myelodysplastic syndrome, unspecified: Secondary | ICD-10-CM

## 2023-04-21 NOTE — Progress Notes (Signed)
Specialty Pharmacy Refill Coordination Note  Chad Lamb is a 78 y.o. male contacted today regarding refills of specialty medication(s) Venetoclax [name]   Patient requested Delivery   Delivery date: 05/05/23   Verified address: [address]  [place]   Medication will be filled on 02.14.25.   Refill pending patient aware

## 2023-04-22 ENCOUNTER — Other Ambulatory Visit: Payer: Self-pay

## 2023-04-22 MED ORDER — VENETOCLAX 100 MG PO TABS
200.0000 mg | ORAL_TABLET | Freq: Every day | ORAL | 1 refills | Status: DC
  Filled 2023-04-22: qty 28, 14d supply, fill #0
  Filled 2023-05-29: qty 28, 14d supply, fill #1
  Filled 2023-05-29: qty 28, 28d supply, fill #1

## 2023-04-25 ENCOUNTER — Other Ambulatory Visit: Payer: Self-pay

## 2023-05-02 ENCOUNTER — Other Ambulatory Visit: Payer: Self-pay

## 2023-05-10 ENCOUNTER — Other Ambulatory Visit: Payer: Self-pay

## 2023-05-12 ENCOUNTER — Inpatient Hospital Stay: Payer: Medicare HMO

## 2023-05-12 ENCOUNTER — Inpatient Hospital Stay: Payer: Medicare HMO | Attending: Hematology | Admitting: Hematology

## 2023-05-12 VITALS — BP 129/77 | HR 63 | Temp 97.4°F | Resp 18

## 2023-05-12 VITALS — BP 123/78 | Wt 149.0 lb

## 2023-05-12 DIAGNOSIS — D4622 Refractory anemia with excess of blasts 2: Secondary | ICD-10-CM | POA: Diagnosis present

## 2023-05-12 DIAGNOSIS — Z95828 Presence of other vascular implants and grafts: Secondary | ICD-10-CM

## 2023-05-12 DIAGNOSIS — D469 Myelodysplastic syndrome, unspecified: Secondary | ICD-10-CM

## 2023-05-12 DIAGNOSIS — Z5111 Encounter for antineoplastic chemotherapy: Secondary | ICD-10-CM | POA: Insufficient documentation

## 2023-05-12 LAB — COMPREHENSIVE METABOLIC PANEL
ALT: 15 U/L (ref 0–44)
AST: 20 U/L (ref 15–41)
Albumin: 3.8 g/dL (ref 3.5–5.0)
Alkaline Phosphatase: 64 U/L (ref 38–126)
Anion gap: 10 (ref 5–15)
BUN: 16 mg/dL (ref 8–23)
CO2: 25 mmol/L (ref 22–32)
Calcium: 9.6 mg/dL (ref 8.9–10.3)
Chloride: 105 mmol/L (ref 98–111)
Creatinine, Ser: 0.98 mg/dL (ref 0.61–1.24)
GFR, Estimated: >60 mL/min (ref >60)
Glucose, Bld: 108 mg/dL — ABNORMAL HIGH (ref 70–99)
Potassium: 3.7 mmol/L (ref 3.5–5.1)
Sodium: 140 mmol/L (ref 135–145)
Total Bilirubin: 2.1 mg/dL — ABNORMAL HIGH (ref 0.0–1.2)
Total Protein: 6.2 g/dL — ABNORMAL LOW (ref 6.5–8.1)

## 2023-05-12 LAB — CBC WITH DIFFERENTIAL/PLATELET
Abs Immature Granulocytes: 0.02 10*3/uL (ref 0.00–0.07)
Basophils Absolute: 0 10*3/uL (ref 0.0–0.1)
Basophils Relative: 0 %
Eosinophils Absolute: 0.3 10*3/uL (ref 0.0–0.5)
Eosinophils Relative: 5 %
HCT: 37.4 % — ABNORMAL LOW (ref 39.0–52.0)
Hemoglobin: 11.7 g/dL — ABNORMAL LOW (ref 13.0–17.0)
Immature Granulocytes: 0 %
Lymphocytes Relative: 20 %
Lymphs Abs: 1.2 10*3/uL (ref 0.7–4.0)
MCH: 25.2 pg — ABNORMAL LOW (ref 26.0–34.0)
MCHC: 31.3 g/dL (ref 30.0–36.0)
MCV: 80.4 fL (ref 80.0–100.0)
Monocytes Absolute: 0.8 10*3/uL (ref 0.1–1.0)
Monocytes Relative: 14 %
Neutro Abs: 3.6 10*3/uL (ref 1.7–7.7)
Neutrophils Relative %: 61 %
Platelets: 222 10*3/uL (ref 150–400)
RBC: 4.65 MIL/uL (ref 4.22–5.81)
RDW: 17.9 % — ABNORMAL HIGH (ref 11.5–15.5)
WBC: 5.9 10*3/uL (ref 4.0–10.5)
nRBC: 0 % (ref 0.0–0.2)

## 2023-05-12 LAB — MAGNESIUM: Magnesium: 2.1 mg/dL (ref 1.7–2.4)

## 2023-05-12 MED ORDER — SODIUM CHLORIDE 0.9% FLUSH
10.0000 mL | INTRAVENOUS | Status: DC | PRN
  Administered 2023-05-12: 10 mL

## 2023-05-12 MED ORDER — PALONOSETRON HCL INJECTION 0.25 MG/5ML
0.2500 mg | Freq: Once | INTRAVENOUS | Status: AC
  Administered 2023-05-12: 0.25 mg via INTRAVENOUS
  Filled 2023-05-12: qty 5

## 2023-05-12 MED ORDER — DEXAMETHASONE SODIUM PHOSPHATE 10 MG/ML IJ SOLN
10.0000 mg | Freq: Once | INTRAMUSCULAR | Status: AC
  Administered 2023-05-12: 10 mg via INTRAVENOUS
  Filled 2023-05-12: qty 1

## 2023-05-12 MED ORDER — SODIUM CHLORIDE 0.9 % IV SOLN
Freq: Once | INTRAVENOUS | Status: AC
Start: 2023-05-12 — End: 2023-05-12

## 2023-05-12 MED ORDER — AZACITIDINE CHEMO INJECTION 100 MG
75.0000 mg/m2 | Freq: Once | INTRAMUSCULAR | Status: AC
  Administered 2023-05-12: 140 mg via INTRAVENOUS
  Filled 2023-05-12: qty 14

## 2023-05-12 MED ORDER — HEPARIN SOD (PORK) LOCK FLUSH 100 UNIT/ML IV SOLN
500.0000 [IU] | Freq: Once | INTRAVENOUS | Status: AC | PRN
  Administered 2023-05-12: 500 [IU]

## 2023-05-12 NOTE — Progress Notes (Signed)
 Patient presents today for Vidaza infusion per providers order.  Vital signs and labs reviewed by MD.  Message received from Anastasio Champion RN/Dr. Delton Coombes patient okay for treatment.  Treatment given today per MD orders.  Stable during infusion without adverse affects.  Vital signs stable.  No complaints at this time.  Discharge from clinic ambulatory in stable condition.  Alert and oriented X 3.  Follow up with [facility] as scheduled.

## 2023-05-12 NOTE — Progress Notes (Unsigned)
Patient is taking Venetoclax as prescribed.  He has not missed any doses and reports no side effects at this time.   

## 2023-05-12 NOTE — Patient Instructions (Signed)

## 2023-05-12 NOTE — Patient Instructions (Signed)
 [facility] [place] - A DEPT OF [facility]  Discharge Instructions: Thank you for choosing [facility] to provide your oncology and hematology care.  If you have a lab appointment with the Cancer Center - please note that after April 8th, 2024, all labs will be drawn in the cancer center.  You do not have to check in or register with the main entrance as you have in the past but will complete your check-in in the cancer center.  Wear comfortable clothing and clothing appropriate for easy access to any Portacath or PICC line.   We strive to give you quality time with your provider. You may need to reschedule your appointment if you arrive late (15 or more minutes).  Arriving late affects you and other patients whose appointments are after yours.  Also, if you miss three or more appointments without notifying the office, you may be dismissed from the clinic at the provider's discretion.      For prescription refill requests, have your pharmacy contact our office and allow 72 hours for refills to be completed.    Today you received the following chemotherapy and/or immunotherapy agents Vidaza      To help prevent nausea and vomiting after your treatment, we encourage you to take your nausea medication as directed.  BELOW ARE SYMPTOMS THAT SHOULD BE REPORTED IMMEDIATELY: *FEVER GREATER THAN 100.4 F (38 C) OR HIGHER *CHILLS OR SWEATING *NAUSEA AND VOMITING THAT IS NOT CONTROLLED WITH YOUR NAUSEA MEDICATION *UNUSUAL SHORTNESS OF BREATH *UNUSUAL BRUISING OR BLEEDING *URINARY PROBLEMS (pain or burning when urinating, or frequent urination) *BOWEL PROBLEMS (unusual diarrhea, constipation, pain near the anus) TENDERNESS IN MOUTH AND THROAT WITH OR WITHOUT PRESENCE OF ULCERS (sore throat, sores in mouth, or a toothache) UNUSUAL RASH, SWELLING OR PAIN  UNUSUAL VAGINAL DISCHARGE OR ITCHING   Items with * indicate a potential emergency and should be followed up as  soon as possible or go to the Emergency Department if any problems should occur.  Please show the CHEMOTHERAPY ALERT CARD or IMMUNOTHERAPY ALERT CARD at check-in to the Emergency Department and triage nurse.  Should you have questions after your visit or need to cancel or reschedule your appointment, please contact [facility] [place] - A DEPT OF Eligha Bridegroom [facility] [phone]  and follow the prompts.  Office hours are 8:00 a.m. to 4:30 p.m. Monday - Friday. Please note that voicemails left after 4:00 p.m. may not be returned until the following business day.  We are closed weekends and major holidays. You have access to a nurse at all times for urgent questions. Please call the main number to the clinic [phone] and follow the prompts.  For any non-urgent questions, you may also contact your provider using MyChart. We now offer e-Visits for anyone 72 and older to request care online for non-urgent symptoms. For details visit [email or web address].   Also download the MyChart app! Go to the app store, search "MyChart", open the app, select [place], and log in with your MyChart username and password.

## 2023-05-12 NOTE — Progress Notes (Signed)
 [facility] [address] [place] [address]    Clinic Day:  05/14/2023  Referring physician: Aliene Beams, MD  Patient Care Team: Aliene Beams, MD as PCP - General (Family Medicine) Aliene Beams, MD (Family Medicine) [place] Bali, MD (Inactive) as Consulting Physician (Gastroenterology) Doreatha Massed, MD as Medical Oncologist (Hematology)   ASSESSMENT & PLAN:   Assessment: 1. MDS with EB 2: - Seen at the request of Dr. Tracie Harrier for cytopenias - BMBX on 06/04/2021: Hypercellular marrow with dyspoietic changes involving the granulocytic cell line and megakaryocytes.  Myeloblasts 8% on aspirate smears and 10% by flow.  Cytogenetics 85, XY (20).  MDS FISH panel normal. 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 - IPSS-M score of 0.12, moderate high risk.  Leukemia free survival is 2.3 years.  Overall survival 2.8 years. - Cycle 1 of azacitidine on 07/30/2021.  BMBX (01/04/2022): Overall improvement in blast count and cellularity.  Chromosome analysis and FISH panel normal.  Venetoclax 200 mg day 1 through 14 added with cycle 8 on 02/18/2022. - BMBX (06/05/2022): Slightly hypercellular for age with very mild dyspoietic changes at best associated with increased number of eosinophilic cells.  No increase in blast cells identified. - Azacitidine 5 days and venetoclax 200 mg days 1-14 every 6 weeks on 07/22/2022   2. Social/family history: - He lives at home with his wife.  He swims about 40 minutes daily. 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  He also worked as a Geologist, engineering.  Non-smoker. - Paternal aunt had breast cancer and mother had pancreatic cancer.  3.  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 - He underwent thrombolytic therapy at [facility]. - He will followed up with vascular at [facility] and has been on Xarelto since then.    Plan: 1. MDS with EB 2: - He has completed 18  cycles of azacitidine and venetoclax. - In the last 6 weeks, denied any fevers. - He lost about 3 pounds despite appetite being good. - Reviewed labs today: Normal LFTs with bilirubin 2.1.  Creatinine normal.  CBC grossly normal with normal differential. - Recommend proceed with cycle 19 today with azacitidine 75 mg/m for 5 days and venetoclax 200 mg daily days 1-14. - RTC 6 weeks for follow-up.   2. Unprovoked left subclavian DVT: - Xarelto discontinued due to hemorrhoidal bleeding.  No evidence of recurrence.   3. Constipation: - Continue Colace daily and MiraLAX as needed.  This is well-controlled.    No orders of the defined types were placed in this encounter.     I,Katie Daubenspeck,acting as a Neurosurgeon for Doreatha Massed, MD.,have documented all relevant documentation on the behalf of Doreatha Massed, MD,as directed by  Doreatha Massed, MD while in the presence of Doreatha Massed, MD.   I, Doreatha Massed MD, have reviewed the above documentation for accuracy and completeness, and I agree with the above.   Doreatha Massed, MD   2/26/20255:36 PM  CHIEF COMPLAINT:   Diagnosis: MDS with EB 2    Cancer Staging  No matching staging information was found for the patient.    Prior Therapy: none  Current Therapy:  Azacitidine and venetoclax    HISTORY OF PRESENT ILLNESS:   Oncology History  Myelodysplastic syndrome (HCC)  06/17/2021 Initial Diagnosis   Myelodysplastic syndrome (HCC)   07/30/2021 - 10/30/2021 Chemotherapy   Patient is on Treatment Plan : MYELODYSPLASIA  Azacitidine IV D1-7 q28d     07/30/2021 -  Chemotherapy   Patient is on Treatment Plan : MYELODYSPLASIA  Azacitidine IV D1-5 q42d        INTERVAL HISTORY:   Senan is a 78 y.o. male presenting to clinic today for follow up of MDS with EB 2. He was last seen by me on 03/31/23.  Today, he states that he is doing well overall. His appetite level is at 100%. His energy level is at  50%.  PAST MEDICAL HISTORY:   Past Medical History: Past Medical History:  Diagnosis Date   Allergy    Anxiety    Arthritis    Asthma    Cataract    Clotting disorder (HCC)    DVT (deep venous thrombosis) (HCC)    unknown etiology. [facility]   GERD (gastroesophageal reflux disease)    History of dental surgery    feb 2023   Hyperlipidemia    Hypertension    MDS (myelodysplastic syndrome), high grade (HCC) 06/17/2021   Pneumonia    Port-A-Cath in place 07/30/2021    Surgical History: Past Surgical History:  Procedure Laterality Date   CATARACT EXTRACTION W/PHACO Right 11/24/2017   Procedure: CATARACT EXTRACTION PHACO AND INTRAOCULAR LENS PLACEMENT RIGHT EYE;  Surgeon: Gemma Payor, MD;  Location: [facility];  Service: Ophthalmology;  Laterality: Right;  CDE: 8.61   CATARACT EXTRACTION W/PHACO Left 12/29/2017   Procedure: CATARACT EXTRACTION PHACO AND INTRAOCULAR LENS PLACEMENT (IOC);  Surgeon: Gemma Payor, MD;  Location: [facility];  Service: Ophthalmology;  Laterality: Left;  CDE: 7.21   GANGLION CYST EXCISION     HERNIA REPAIR     bilateral   IR BONE MARROW BIOPSY & ASPIRATION  06/05/2022   PARS PLANA VITRECTOMY Left 05/17/2021   Procedure: PARS PLANA VITRECTOMY 25 GAUGE WITH ANTIBIOTIC INJECTION  AND ENDOLASER LEFT EYE;  Surgeon: Carmela Rima, MD;  Location: [facility] OR;  Service: Ophthalmology;  Laterality: Left;   PORTACATH PLACEMENT Right 07/09/2021   Procedure: INSERTION PORT-A-CATH;  Surgeon: Franky Macho, MD;  Location: [facility];  Service: General;  Laterality: Right;   TONSILLECTOMY     VASCULAR SURGERY      Social History: Social History   Socioeconomic History   Marital status: Married    Spouse name: Harriett Sine   Number of children: 0   Years of education: 13   Highest education level: High school graduate  Occupational History   Not on file  Tobacco Use   Smoking status: Former    Types: Pipe    Quit date: 03/21/1966    Years since quitting: 57.1   Smokeless tobacco:  Never  Vaping Use   Vaping status: Never Used  Substance and Sexual Activity   Alcohol use: Yes    Alcohol/week: 1.0 standard drink of alcohol    Types: 1 Cans of beer per week    Comment: 1 can of beer a week   Drug use: Never   Sexual activity: Not Currently  Other Topics Concern   Not on file  Social History Narrative   ** Merged History Encounter **       Grew up in [place], [place] and [place].  Retired.  Worked in [place]. 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    Social Drivers of Corporate investment banker Strain: Low Risk  (01/23/2017)   Overall Financial Resource Strain (CARDIA)    Difficulty of Paying Living Expenses: Not hard at  all  Food Insecurity: No Food Insecurity (01/23/2017)   Hunger Vital Sign    Worried About Running Out of Food in the Last Year: Never true    Ran Out of Food in the Last Year: Never true  Transportation Needs: No Transportation Needs (01/23/2017)   PRAPARE - Administrator, Civil Service (Medical): No    Lack of Transportation (Non-Medical): No  Physical Activity: Unknown (01/23/2017)   Exercise Vital Sign    Days of Exercise per Week: 3 days    Minutes of Exercise per Session: Not on file  Stress: Stress Concern Present (01/23/2017)   [employer] of Occupational Health - Occupational Stress Questionnaire    Feeling of Stress : To some extent  Social Connections: Somewhat Isolated (01/23/2017)   Social Connection and Isolation Panel [NHANES]    Frequency of Communication with Friends and Family: More than three times a week    Frequency of Social Gatherings with Friends and Family: More than three times a week    Attends Religious Services: Never    Database administrator or Organizations: No    Attends Banker Meetings: Never    Marital Status: Married  Catering manager Violence: Not At Risk (01/23/2017)   Humiliation, Afraid, Rape, and Kick questionnaire     Fear of Current or Ex-Partner: No    Emotionally Abused: No    Physically Abused: No    Sexually Abused: No    Family History: Family History  Problem Relation Age of Onset   Cancer Mother        pancreatic   Arthritis Father    Early death Father 63       Lung infiltrate   Colon cancer Neg Hx    Colon polyps Neg Hx     Current Medications:  Current Outpatient Medications:    albuterol (PROVENTIL HFA;VENTOLIN HFA) 108 (90 Base) MCG/ACT inhaler, Inhale 1-2 puffs into the lungs every 6 (six) hours as needed for wheezing or shortness of breath., Disp: , Rfl:    ALPRAZolam (XANAX) 0.5 MG tablet, Take 0.5 mg by mouth 2 (two) times daily as needed for anxiety., Disp: , Rfl:    amLODipine (NORVASC) 2.5 MG tablet, Take 2.5 mg by mouth daily., Disp: , Rfl:    azaCITIDine 5 mg/2 mLs in lactated ringers infusion, Inject into the vein daily. Days 1-7 every 28 days, Disp: , Rfl:    DENTA 5000 PLUS 1.1 % CREA dental cream, Take by mouth., Disp: , Rfl:    docusate sodium (COLACE) 100 MG capsule, 1 capsule Orally daily (3 daily when on chemo), Disp: , Rfl:    fluticasone furoate-vilanterol (BREO ELLIPTA) 200-25 MCG/ACT AEPB, Inhale 1 puff into the lungs daily., Disp: , Rfl:    Lactulose 20 GM/30ML SOLN, Take 30 mLs (20 g total) by mouth at bedtime., Disp: 473 mL, Rfl: 3   Lidocaine-Hydrocort, Perianal, 3-0.5 % CREA, Apply topically 2 (two) times daily as needed., Disp: , Rfl:    Lidocaine-Hydrocortisone Ace 3-0.5 % CREA, Apply 1 application topically as directed. (Patient taking differently: Apply 1 application  topically 2 (two) times daily as needed (hemorrhoids).), Disp: 30 Tube, Rfl: 11   lidocaine-prilocaine (EMLA) cream, Apply a SMALL AMOUNT TO port a cath site (DO not RUB in) AND cover with PLASTIC WRAP ONE hour prior TO infusion appointment, Disp: 30 g, Rfl: 3   loratadine (CLARITIN) 10 MG tablet, Take 10 mg by mouth daily., Disp: , Rfl:  Lutein 40 MG CAPS, Take 40 mg by mouth daily.,  Disp: , Rfl:    Multiple Vitamins-Minerals (CENTRUM ADULTS PO), Take 1 tablet by mouth daily., Disp: , Rfl:    nystatin cream (MYCOSTATIN), Apply 1 application. topically 2 (two) times daily as needed for dry skin., Disp: , Rfl:    ofloxacin (OCUFLOX) 0.3 % ophthalmic solution, Place 1 drop into the left eye See admin instructions. 1 drop to left eye 4 x daily for 7 days after monthly procedure., Disp: , Rfl:    omeprazole (PRILOSEC OTC) 20 MG tablet, Take 5-10 mg by mouth daily., Disp: , Rfl:    sildenafil (VIAGRA) 50 MG tablet, Take 50 mg by mouth daily as needed for erectile dysfunction., Disp: , Rfl:    traMADol (ULTRAM) 50 MG tablet, Take 1 tablet (50 mg total) by mouth every 6 (six) hours as needed., Disp: 15 tablet, Rfl: 0   venetoclax (VENCLEXTA) 100 MG tablet, Take 2 tablets (200 mg total) by mouth daily. Take for 14 days, then hold for 14 days. Repeat every 28 days. Take with a meal and a full glass of water., Disp: 28 tablet, Rfl: 1 No current facility-administered medications for this visit.  Facility-Administered Medications Ordered in Other Visits:    sodium chloride flush (NS) 0.9 % injection 10 mL, 10 mL, Intracatheter, PRN, Doreatha Massed, MD, 10 mL at 06/12/22 1606   sodium chloride flush (NS) 0.9 % injection 10 mL, 10 mL, Intracatheter, PRN, Doreatha Massed, MD, 10 mL at 05/14/23 1415   Allergies: Allergies  Allergen Reactions   Cephalosporins Itching   Amoxapine And Related Itching and Rash    Has patient had a PCN reaction causing immediate rash, facial/tongue/throat swelling, SOB or lightheadedness with hypotension: No Has patient had a PCN reaction causing severe rash involving mucus membranes or skin necrosis: No Has patient had a PCN reaction that required hospitalization: No Has patient had a PCN reaction occurring within the last 10 years: No If all of the above answers are "NO", then may proceed with Cephalosporin use.     Amoxicillin Rash    Cephalexin Rash   Clindamycin/Lincomycin Itching and Rash   Sulfamethoxazole Itching and Rash   Trimethoprim Rash    REVIEW OF SYSTEMS:   Review of Systems  Constitutional:  Negative for chills, fatigue and fever.  HENT:   Negative for lump/mass, mouth sores, nosebleeds, sore throat and trouble swallowing.   Eyes:  Negative for eye problems.  Respiratory:  Positive for cough. Negative for shortness of breath.   Cardiovascular:  Negative for chest pain, leg swelling and palpitations.  Gastrointestinal:  Positive for constipation. Negative for abdominal pain, diarrhea, nausea and vomiting.  Genitourinary:  Negative for bladder incontinence, difficulty urinating, dysuria, frequency, hematuria and nocturia.   Musculoskeletal:  Negative for arthralgias, back pain, flank pain, myalgias and neck pain.  Skin:  Negative for itching and rash.  Neurological:  Positive for dizziness. Negative for headaches and numbness.  Hematological:  Does not bruise/bleed easily.  Psychiatric/Behavioral:  Negative for depression, sleep disturbance and suicidal ideas. The patient is not nervous/anxious.   All other systems reviewed and are negative.    VITALS:   Blood pressure 123/78, weight 149 lb 0.5 oz (67.6 kg).  Wt Readings from Last 3 Encounters:  05/13/23 152 lb 6.4 oz (69.1 kg)  05/12/23 149 lb 0.5 oz (67.6 kg)  03/31/23 152 lb 9.6 oz (69.2 kg)    Body mass index is 22.01 kg/m.  Performance status (ECOG):  1 - Symptomatic but completely ambulatory  PHYSICAL EXAM:   Physical Exam Vitals and nursing note reviewed. Exam conducted with a chaperone present.  Constitutional:      Appearance: Normal appearance.  Cardiovascular:     Rate and Rhythm: Normal rate and regular rhythm.     Pulses: Normal pulses.     Heart sounds: Normal heart sounds.  Pulmonary:     Effort: Pulmonary effort is normal.     Breath sounds: Normal breath sounds.  Abdominal:     Palpations: Abdomen is soft. There is no  hepatomegaly, splenomegaly or mass.     Tenderness: There is no abdominal tenderness.  Musculoskeletal:     Right lower leg: No edema.     Left lower leg: No edema.  Lymphadenopathy:     Cervical: No cervical adenopathy.     Right cervical: No superficial, deep or posterior cervical adenopathy.    Left cervical: No superficial, deep or posterior cervical adenopathy.     Upper Body:     Right upper body: No supraclavicular or axillary adenopathy.     Left upper body: No supraclavicular or axillary adenopathy.  Neurological:     General: No focal deficit present.     Mental Status: He is alert and oriented to person, place, and time.  Psychiatric:        Mood and Affect: Mood normal.        Behavior: Behavior normal.     LABS:   CBC     Component Value Date/Time   WBC 5.9 05/12/2023 1018   RBC 4.65 05/12/2023 1018   HGB 11.7 (L) 05/12/2023 1018   HCT 37.4 (L) 05/12/2023 1018   PLT 222 05/12/2023 1018   MCV 80.4 05/12/2023 1018   MCH 25.2 (L) 05/12/2023 1018   MCHC 31.3 05/12/2023 1018   RDW 17.9 (H) 05/12/2023 1018   LYMPHSABS 1.2 05/12/2023 1018   MONOABS 0.8 05/12/2023 1018   EOSABS 0.3 05/12/2023 1018   BASOSABS 0.0 05/12/2023 1018    CMP      Component Value Date/Time   NA 140 05/12/2023 1018   K 3.7 05/12/2023 1018   CL 105 05/12/2023 1018   CO2 25 05/12/2023 1018   GLUCOSE 108 (H) 05/12/2023 1018   BUN 16 05/12/2023 1018   CREATININE 0.98 05/12/2023 1018   CREATININE 0.98 02/12/2017 1421   CALCIUM 9.6 05/12/2023 1018   PROT 6.2 (L) 05/12/2023 1018   ALBUMIN 3.8 05/12/2023 1018   AST 20 05/12/2023 1018   ALT 15 05/12/2023 1018   ALKPHOS 64 05/12/2023 1018   BILITOT 2.1 (H) 05/12/2023 1018   GFRNONAA >60 05/12/2023 1018   GFRNONAA 77 02/12/2017 1421   GFRAA >60 11/19/2017 1532   GFRAA 89 02/12/2017 1421     No results found for: "CEA1", "CEA" / No results found for: "CEA1", "CEA" No results found for: "PSA1" No results found for: "CAN199" No  results found for: "CAN125"  Lab Results  Component Value Date   TOTALPROTELP 6.3 01/05/2021   ALBUMINELP 3.3 01/05/2021   A1GS 0.3 01/05/2021   A2GS 0.8 01/05/2021   BETS 1.0 01/05/2021   GAMS 1.0 01/05/2021   MSPIKE Not Observed 01/05/2021   SPEI Comment 01/05/2021   No results found for: "TIBC", "FERRITIN", "IRONPCTSAT" Lab Results  Component Value Date   LDH 147 11/20/2021   LDH 143 10/22/2021   LDH 144 10/16/2021     STUDIES:   No results found.

## 2023-05-13 ENCOUNTER — Inpatient Hospital Stay: Payer: Medicare HMO

## 2023-05-13 VITALS — BP 138/76 | HR 62 | Temp 97.9°F | Resp 18 | Wt 152.4 lb

## 2023-05-13 DIAGNOSIS — D469 Myelodysplastic syndrome, unspecified: Secondary | ICD-10-CM

## 2023-05-13 DIAGNOSIS — Z95828 Presence of other vascular implants and grafts: Secondary | ICD-10-CM

## 2023-05-13 DIAGNOSIS — Z5111 Encounter for antineoplastic chemotherapy: Secondary | ICD-10-CM | POA: Diagnosis not present

## 2023-05-13 MED ORDER — SODIUM CHLORIDE 0.9% FLUSH
10.0000 mL | INTRAVENOUS | Status: DC | PRN
Start: 2023-05-13 — End: 2023-05-13
  Administered 2023-05-13: 10 mL

## 2023-05-13 MED ORDER — AZACITIDINE CHEMO INJECTION 100 MG
75.0000 mg/m2 | Freq: Once | INTRAMUSCULAR | Status: AC
  Administered 2023-05-13: 140 mg via INTRAVENOUS
  Filled 2023-05-13: qty 14

## 2023-05-13 MED ORDER — HEPARIN SOD (PORK) LOCK FLUSH 100 UNIT/ML IV SOLN
500.0000 [IU] | Freq: Once | INTRAVENOUS | Status: AC | PRN
  Administered 2023-05-13: 500 [IU]

## 2023-05-13 MED ORDER — SODIUM CHLORIDE 0.9 % IV SOLN: Freq: Once | INTRAVENOUS | Status: AC

## 2023-05-13 MED ORDER — DEXAMETHASONE SODIUM PHOSPHATE 10 MG/ML IJ SOLN
10.0000 mg | Freq: Once | INTRAMUSCULAR | Status: AC
  Administered 2023-05-13: 10 mg via INTRAVENOUS
  Filled 2023-05-13: qty 1

## 2023-05-13 NOTE — Progress Notes (Signed)
Patient tolerated chemotherapy with no complaints voiced. Side effects with management reviewed understanding verbalized. Port site clean and dry with no bruising or swelling noted at site. Good blood return noted before and after administration of chemotherapy. Band aid applied. Patient left in satisfactory condition with VSS and no s/s of distress noted. 

## 2023-05-13 NOTE — Patient Instructions (Signed)
 [facility] [place] [facility]  Discharge Instructions: Thank you for choosing [facility] to provide your oncology and hematology care.  If you have a lab appointment with the Cancer Center - please note that after April 8th, 2024, all labs will be drawn in the cancer center.  You do not have to check in or register with the main entrance as you have in the past but will complete your check-in in the cancer center.  Wear comfortable clothing and clothing appropriate for easy access to any Portacath or PICC line.   We strive to give you quality time with your provider. You may need to reschedule your appointment if you arrive late (15 or more minutes).  Arriving late affects you and other patients whose appointments are after yours.  Also, if you miss three or more appointments without notifying the office, you may be dismissed from the clinic at the provider's discretion.      For prescription refill requests, have your pharmacy contact our office and allow 72 hours for refills to be completed.    Today you received the following chemotherapy and/or immunotherapy agents Vidaza, return as scheduled.   To help prevent nausea and vomiting after your treatment, we encourage you to take your nausea medication as directed.  BELOW ARE SYMPTOMS THAT SHOULD BE REPORTED IMMEDIATELY: *FEVER GREATER THAN 100.4 F (38 C) OR HIGHER *CHILLS OR SWEATING *NAUSEA AND VOMITING THAT IS NOT CONTROLLED WITH YOUR NAUSEA MEDICATION *UNUSUAL SHORTNESS OF BREATH *UNUSUAL BRUISING OR BLEEDING *URINARY PROBLEMS (pain or burning when urinating, or frequent urination) *BOWEL PROBLEMS (unusual diarrhea, constipation, pain near the anus) TENDERNESS IN MOUTH AND THROAT WITH OR WITHOUT PRESENCE OF ULCERS (sore throat, sores in mouth, or a toothache) UNUSUAL RASH, SWELLING OR PAIN  UNUSUAL VAGINAL DISCHARGE OR ITCHING   Items with * indicate a potential emergency and  should be followed up as soon as possible or go to the Emergency Department if any problems should occur.  Please show the CHEMOTHERAPY ALERT CARD or IMMUNOTHERAPY ALERT CARD at check-in to the Emergency Department and triage nurse.  Should you have questions after your visit or need to cancel or reschedule your appointment, please contact [facility]  - A DEPT OF Eligha Bridegroom [facility] [phone]  and follow the prompts.  Office hours are 8:00 a.m. to 4:30 p.m. Monday - Friday. Please note that voicemails left after 4:00 p.m. may not be returned until the following business day.  We are closed weekends and major holidays. You have access to a nurse at all times for urgent questions. Please call the main number to the clinic [phone] and follow the prompts.  For any non-urgent questions, you may also contact your provider using MyChart. We now offer e-Visits for anyone 68 and older to request care online for non-urgent symptoms. For details visit [email or web address].   Also download the MyChart app! Go to the app store, search "MyChart", open the app, select [place], and log in with your MyChart username and password.

## 2023-05-14 ENCOUNTER — Inpatient Hospital Stay: Payer: Medicare HMO

## 2023-05-14 ENCOUNTER — Encounter: Payer: Self-pay | Admitting: Hematology

## 2023-05-14 VITALS — BP 135/81 | HR 65 | Temp 98.9°F | Resp 18

## 2023-05-14 DIAGNOSIS — D469 Myelodysplastic syndrome, unspecified: Secondary | ICD-10-CM

## 2023-05-14 DIAGNOSIS — Z5111 Encounter for antineoplastic chemotherapy: Secondary | ICD-10-CM | POA: Diagnosis not present

## 2023-05-14 DIAGNOSIS — Z95828 Presence of other vascular implants and grafts: Secondary | ICD-10-CM

## 2023-05-14 MED ORDER — SODIUM CHLORIDE 0.9% FLUSH
10.0000 mL | INTRAVENOUS | Status: DC | PRN
  Administered 2023-05-14: 10 mL

## 2023-05-14 MED ORDER — SODIUM CHLORIDE 0.9 % IV SOLN
75.0000 mg/m2 | Freq: Once | INTRAVENOUS | Status: AC
  Administered 2023-05-14: 140 mg via INTRAVENOUS
  Filled 2023-05-14: qty 14

## 2023-05-14 MED ORDER — PALONOSETRON HCL INJECTION 0.25 MG/5ML
0.2500 mg | Freq: Once | INTRAVENOUS | Status: AC
  Administered 2023-05-14: 0.25 mg via INTRAVENOUS
  Filled 2023-05-14: qty 5

## 2023-05-14 MED ORDER — HEPARIN SOD (PORK) LOCK FLUSH 100 UNIT/ML IV SOLN
500.0000 [IU] | Freq: Once | INTRAVENOUS | Status: AC | PRN
  Administered 2023-05-14: 500 [IU]

## 2023-05-14 MED ORDER — SODIUM CHLORIDE 0.9 % IV SOLN: Freq: Once | INTRAVENOUS | Status: AC

## 2023-05-14 MED ORDER — DEXAMETHASONE SODIUM PHOSPHATE 10 MG/ML IJ SOLN
10.0000 mg | Freq: Once | INTRAMUSCULAR | Status: AC
  Administered 2023-05-14: 10 mg via INTRAVENOUS
  Filled 2023-05-14: qty 1

## 2023-05-14 NOTE — Progress Notes (Signed)
 Patient presents today for Day 3 Vidaza infusion per providers order.  Vital signs within parameters for treatment.  Patient has no new complaints at this time.  Treatment given today per MD orders.  Stable during infusion without adverse affects.  Vital signs stable.  No complaints at this time.  Discharge from clinic ambulatory in stable condition.  Alert and oriented X 3.  Follow up with [facility] as scheduled.

## 2023-05-14 NOTE — Patient Instructions (Signed)
 [facility] [place] [facility]  Discharge Instructions: Thank you for choosing [facility] to provide your oncology and hematology care.  If you have a lab appointment with the Cancer Center - please note that after April 8th, 2024, all labs will be drawn in the cancer center.  You do not have to check in or register with the main entrance as you have in the past but will complete your check-in in the cancer center.  Wear comfortable clothing and clothing appropriate for easy access to any Portacath or PICC line.   We strive to give you quality time with your provider. You may need to reschedule your appointment if you arrive late (15 or more minutes).  Arriving late affects you and other patients whose appointments are after yours.  Also, if you miss three or more appointments without notifying the office, you may be dismissed from the clinic at the provider's discretion.      For prescription refill requests, have your pharmacy contact our office and allow 72 hours for refills to be completed.    Today you received the following chemotherapy and/or immunotherapy agents vidaza.       To help prevent nausea and vomiting after your treatment, we encourage you to take your nausea medication as directed.  BELOW ARE SYMPTOMS THAT SHOULD BE REPORTED IMMEDIATELY: *FEVER GREATER THAN 100.4 F (38 C) OR HIGHER *CHILLS OR SWEATING *NAUSEA AND VOMITING THAT IS NOT CONTROLLED WITH YOUR NAUSEA MEDICATION *UNUSUAL SHORTNESS OF BREATH *UNUSUAL BRUISING OR BLEEDING *URINARY PROBLEMS (pain or burning when urinating, or frequent urination) *BOWEL PROBLEMS (unusual diarrhea, constipation, pain near the anus) TENDERNESS IN MOUTH AND THROAT WITH OR WITHOUT PRESENCE OF ULCERS (sore throat, sores in mouth, or a toothache) UNUSUAL RASH, SWELLING OR PAIN  UNUSUAL VAGINAL DISCHARGE OR ITCHING   Items with * indicate a potential emergency and should be followed up  as soon as possible or go to the Emergency Department if any problems should occur.  Please show the CHEMOTHERAPY ALERT CARD or IMMUNOTHERAPY ALERT CARD at check-in to the Emergency Department and triage nurse.  Should you have questions after your visit or need to cancel or reschedule your appointment, please contact [facility] [place] - A DEPT OF [facility]ideg[facility] [phone]  and follow the prompts.  Office hours are 8:00 a.m. to 4:30 p.m. Monday - Friday. Please note that voicemails left after 4:00 p.m. may not be returned until the following business day.  We are closed weekends and major holidays. You have access to a nurse at all times for urgent questions. Please call the main number to the clinic [phone] and follow the prompts.  For any non-urgent questions, you may also contact your provider using MyChart. We now offer e-Visits for anyone 8 and older to request care online for non-urgent symptoms. For details visit [email or web address].   Also download the MyChart app! Go to the app store, search "MyChart", open the app, select [place], and log in with your MyChart username and password.

## 2023-05-15 ENCOUNTER — Inpatient Hospital Stay: Payer: Medicare HMO

## 2023-05-15 VITALS — BP 126/83 | HR 58 | Temp 97.0°F | Resp 18

## 2023-05-15 DIAGNOSIS — Z95828 Presence of other vascular implants and grafts: Secondary | ICD-10-CM

## 2023-05-15 DIAGNOSIS — Z5111 Encounter for antineoplastic chemotherapy: Secondary | ICD-10-CM | POA: Diagnosis not present

## 2023-05-15 DIAGNOSIS — D469 Myelodysplastic syndrome, unspecified: Secondary | ICD-10-CM

## 2023-05-15 MED ORDER — DEXAMETHASONE SODIUM PHOSPHATE 10 MG/ML IJ SOLN
10.0000 mg | Freq: Once | INTRAMUSCULAR | Status: AC
  Administered 2023-05-15: 10 mg via INTRAVENOUS
  Filled 2023-05-15: qty 1

## 2023-05-15 MED ORDER — SODIUM CHLORIDE 0.9 % IV SOLN
Freq: Once | INTRAVENOUS | Status: AC
Start: 2023-05-15 — End: 2023-05-15

## 2023-05-15 MED ORDER — HEPARIN SOD (PORK) LOCK FLUSH 100 UNIT/ML IV SOLN
500.0000 [IU] | Freq: Once | INTRAVENOUS | Status: AC | PRN
  Administered 2023-05-15: 500 [IU]

## 2023-05-15 MED ORDER — SODIUM CHLORIDE 0.9 % IV SOLN
75.0000 mg/m2 | Freq: Once | INTRAVENOUS | Status: AC
  Administered 2023-05-15: 140 mg via INTRAVENOUS
  Filled 2023-05-15: qty 14

## 2023-05-15 MED FILL — Azacitidine For Inj 100 MG: INTRAMUSCULAR | Qty: 14 | Status: AC

## 2023-05-15 NOTE — Progress Notes (Signed)
 Patient tolerated chemotherapy with no complaints voiced.  Side effects with management reviewed with understanding verbalized.  Port site clean and dry with no bruising or swelling noted at site.  Good blood return noted before and after administration of chemotherapy.  Band aid applied.  Patient left in satisfactory condition with VSS and no s/s of distress noted. All follow ups as scheduled.   Venkat Ankney [employer]

## 2023-05-15 NOTE — Patient Instructions (Signed)
 [facility] [place] [facility]  Discharge Instructions: Thank you for choosing [facility] to provide your oncology and hematology care.  If you have a lab appointment with the Cancer Center - please note that after April 8th, 2024, all labs will be drawn in the cancer center.  You do not have to check in or register with the main entrance as you have in the past but will complete your check-in in the cancer center.  Wear comfortable clothing and clothing appropriate for easy access to any Portacath or PICC line.   We strive to give you quality time with your provider. You may need to reschedule your appointment if you arrive late (15 or more minutes).  Arriving late affects you and other patients whose appointments are after yours.  Also, if you miss three or more appointments without notifying the office, you may be dismissed from the clinic at the provider's discretion.      For prescription refill requests, have your pharmacy contact our office and allow 72 hours for refills to be completed.    Today you received the following chemotherapy and/or immunotherapy agents vidaza.       To help prevent nausea and vomiting after your treatment, we encourage you to take your nausea medication as directed.  BELOW ARE SYMPTOMS THAT SHOULD BE REPORTED IMMEDIATELY: *FEVER GREATER THAN 100.4 F (38 C) OR HIGHER *CHILLS OR SWEATING *NAUSEA AND VOMITING THAT IS NOT CONTROLLED WITH YOUR NAUSEA MEDICATION *UNUSUAL SHORTNESS OF BREATH *UNUSUAL BRUISING OR BLEEDING *URINARY PROBLEMS (pain or burning when urinating, or frequent urination) *BOWEL PROBLEMS (unusual diarrhea, constipation, pain near the anus) TENDERNESS IN MOUTH AND THROAT WITH OR WITHOUT PRESENCE OF ULCERS (sore throat, sores in mouth, or a toothache) UNUSUAL RASH, SWELLING OR PAIN  UNUSUAL VAGINAL DISCHARGE OR ITCHING   Items with * indicate a potential emergency and should be followed up  as soon as possible or go to the Emergency Department if any problems should occur.  Please show the CHEMOTHERAPY ALERT CARD or IMMUNOTHERAPY ALERT CARD at check-in to the Emergency Department and triage nurse.  Should you have questions after your visit or need to cancel or reschedule your appointment, please contact [facility] [place] - A DEPT OF [facility]ideg[facility] [phone]  and follow the prompts.  Office hours are 8:00 a.m. to 4:30 p.m. Monday - Friday. Please note that voicemails left after 4:00 p.m. may not be returned until the following business day.  We are closed weekends and major holidays. You have access to a nurse at all times for urgent questions. Please call the main number to the clinic [phone] and follow the prompts.  For any non-urgent questions, you may also contact your provider using MyChart. We now offer e-Visits for anyone 8 and older to request care online for non-urgent symptoms. For details visit [email or web address].   Also download the MyChart app! Go to the app store, search "MyChart", open the app, select [place], and log in with your MyChart username and password.

## 2023-05-16 ENCOUNTER — Inpatient Hospital Stay: Payer: Medicare HMO

## 2023-05-16 VITALS — BP 122/75 | HR 55 | Temp 98.6°F | Resp 16

## 2023-05-16 DIAGNOSIS — D469 Myelodysplastic syndrome, unspecified: Secondary | ICD-10-CM

## 2023-05-16 DIAGNOSIS — Z5111 Encounter for antineoplastic chemotherapy: Secondary | ICD-10-CM | POA: Diagnosis not present

## 2023-05-16 DIAGNOSIS — Z95828 Presence of other vascular implants and grafts: Secondary | ICD-10-CM

## 2023-05-16 MED ORDER — SODIUM CHLORIDE 0.9 % IV SOLN: Freq: Once | INTRAVENOUS | Status: AC

## 2023-05-16 MED ORDER — HEPARIN SOD (PORK) LOCK FLUSH 100 UNIT/ML IV SOLN
500.0000 [IU] | Freq: Once | INTRAVENOUS | Status: AC | PRN
  Administered 2023-05-16: 500 [IU]

## 2023-05-16 MED ORDER — PALONOSETRON HCL INJECTION 0.25 MG/5ML
0.2500 mg | Freq: Once | INTRAVENOUS | Status: AC
  Administered 2023-05-16: 0.25 mg via INTRAVENOUS
  Filled 2023-05-16: qty 5

## 2023-05-16 MED ORDER — DEXAMETHASONE SODIUM PHOSPHATE 10 MG/ML IJ SOLN
10.0000 mg | Freq: Once | INTRAMUSCULAR | Status: AC
  Administered 2023-05-16: 10 mg via INTRAVENOUS
  Filled 2023-05-16: qty 1

## 2023-05-16 MED ORDER — SODIUM CHLORIDE 0.9 % IV SOLN
75.0000 mg/m2 | Freq: Once | INTRAVENOUS | Status: AC
  Administered 2023-05-16: 140 mg via INTRAVENOUS
  Filled 2023-05-16: qty 14

## 2023-05-16 NOTE — Progress Notes (Signed)
 Patient tolerated chemotherapy with no complaints voiced.  Side effects with management reviewed with understanding verbalized.  Port site clean and dry with no bruising or swelling noted at site.  Good blood return noted before and after administration of chemotherapy.  Band aid applied.  Patient left in satisfactory condition with VSS and no s/s of distress noted. All follow ups as scheduled.   Venkat Ankney [employer]

## 2023-05-16 NOTE — Patient Instructions (Signed)
 [facility] [place] [facility]  Discharge Instructions: Thank you for choosing [facility] to provide your oncology and hematology care.  If you have a lab appointment with the Cancer Center - please note that after April 8th, 2024, all labs will be drawn in the cancer center.  You do not have to check in or register with the main entrance as you have in the past but will complete your check-in in the cancer center.  Wear comfortable clothing and clothing appropriate for easy access to any Portacath or PICC line.   We strive to give you quality time with your provider. You may need to reschedule your appointment if you arrive late (15 or more minutes).  Arriving late affects you and other patients whose appointments are after yours.  Also, if you miss three or more appointments without notifying the office, you may be dismissed from the clinic at the provider's discretion.      For prescription refill requests, have your pharmacy contact our office and allow 72 hours for refills to be completed.    Today you received the following chemotherapy and/or immunotherapy agents vidaza.       To help prevent nausea and vomiting after your treatment, we encourage you to take your nausea medication as directed.  BELOW ARE SYMPTOMS THAT SHOULD BE REPORTED IMMEDIATELY: *FEVER GREATER THAN 100.4 F (38 C) OR HIGHER *CHILLS OR SWEATING *NAUSEA AND VOMITING THAT IS NOT CONTROLLED WITH YOUR NAUSEA MEDICATION *UNUSUAL SHORTNESS OF BREATH *UNUSUAL BRUISING OR BLEEDING *URINARY PROBLEMS (pain or burning when urinating, or frequent urination) *BOWEL PROBLEMS (unusual diarrhea, constipation, pain near the anus) TENDERNESS IN MOUTH AND THROAT WITH OR WITHOUT PRESENCE OF ULCERS (sore throat, sores in mouth, or a toothache) UNUSUAL RASH, SWELLING OR PAIN  UNUSUAL VAGINAL DISCHARGE OR ITCHING   Items with * indicate a potential emergency and should be followed up  as soon as possible or go to the Emergency Department if any problems should occur.  Please show the CHEMOTHERAPY ALERT CARD or IMMUNOTHERAPY ALERT CARD at check-in to the Emergency Department and triage nurse.  Should you have questions after your visit or need to cancel or reschedule your appointment, please contact [facility] [place] - A DEPT OF [facility]ideg[facility] [phone]  and follow the prompts.  Office hours are 8:00 a.m. to 4:30 p.m. Monday - Friday. Please note that voicemails left after 4:00 p.m. may not be returned until the following business day.  We are closed weekends and major holidays. You have access to a nurse at all times for urgent questions. Please call the main number to the clinic [phone] and follow the prompts.  For any non-urgent questions, you may also contact your provider using MyChart. We now offer e-Visits for anyone 8 and older to request care online for non-urgent symptoms. For details visit [email or web address].   Also download the MyChart app! Go to the app store, search "MyChart", open the app, select [place], and log in with your MyChart username and password.

## 2023-05-27 ENCOUNTER — Other Ambulatory Visit: Payer: Self-pay

## 2023-05-29 ENCOUNTER — Other Ambulatory Visit (HOSPITAL_COMMUNITY): Payer: Self-pay

## 2023-05-29 ENCOUNTER — Other Ambulatory Visit: Payer: Self-pay | Admitting: Pharmacy Technician

## 2023-05-29 ENCOUNTER — Other Ambulatory Visit: Payer: Self-pay

## 2023-05-29 NOTE — Progress Notes (Signed)
 Specialty Pharmacy Refill Coordination Note  Chad Lamb is a 78 y.o. male contacted today regarding refills of specialty medication(s) Venetoclax [name]   Patient requested Delivery   Delivery date: 06/17/23   Verified address: [address] [place] [address]   Medication will be filled on 06/16/23.

## 2023-06-13 IMAGING — DX DG CHEST 2V
2 series · 2 of 2 positions shown · non-contrast
Comparison: No priors.

CLINICAL DATA: 75-year-old male with history of cough.

EXAM:
CHEST - 2 VIEW

[chest pa]
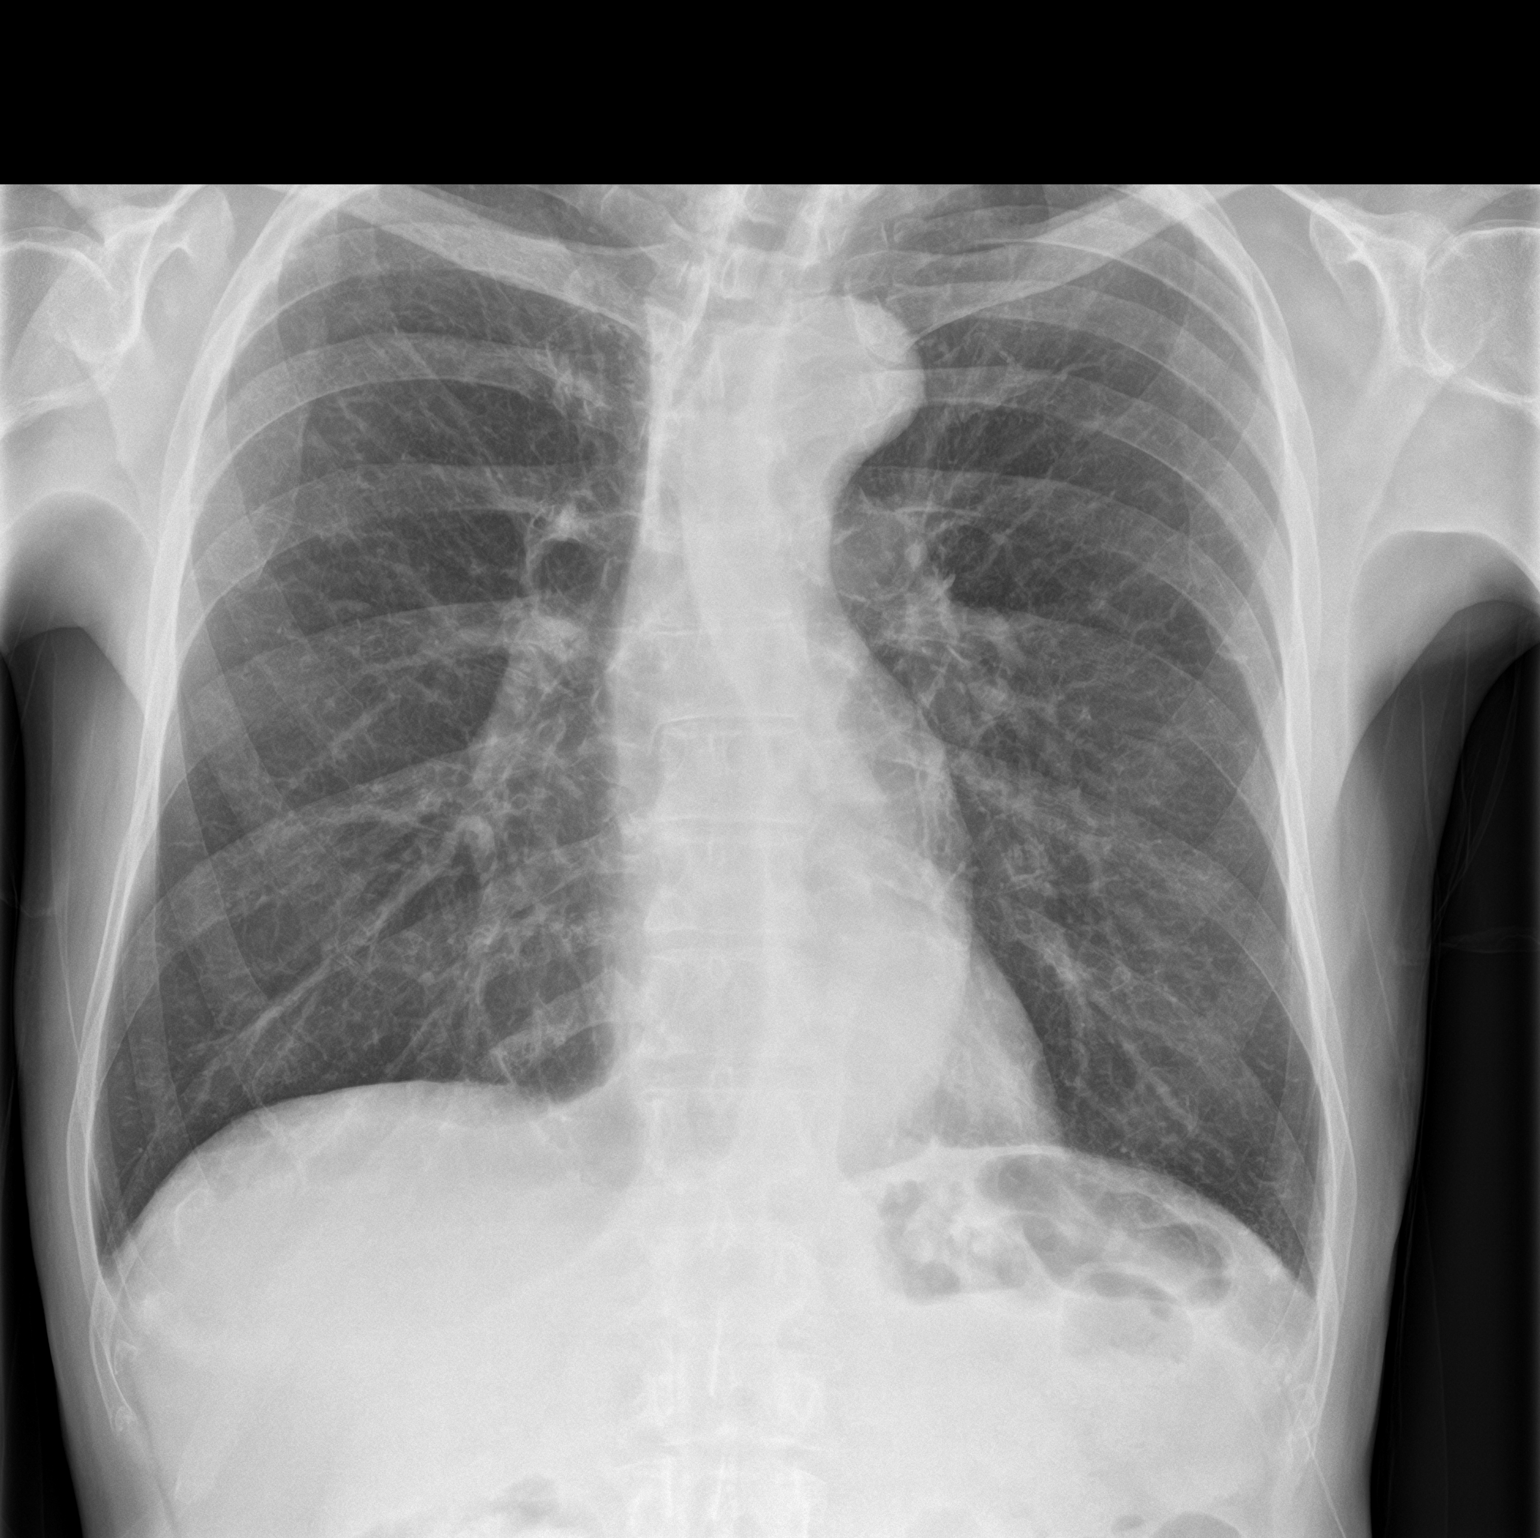

[chest lat]
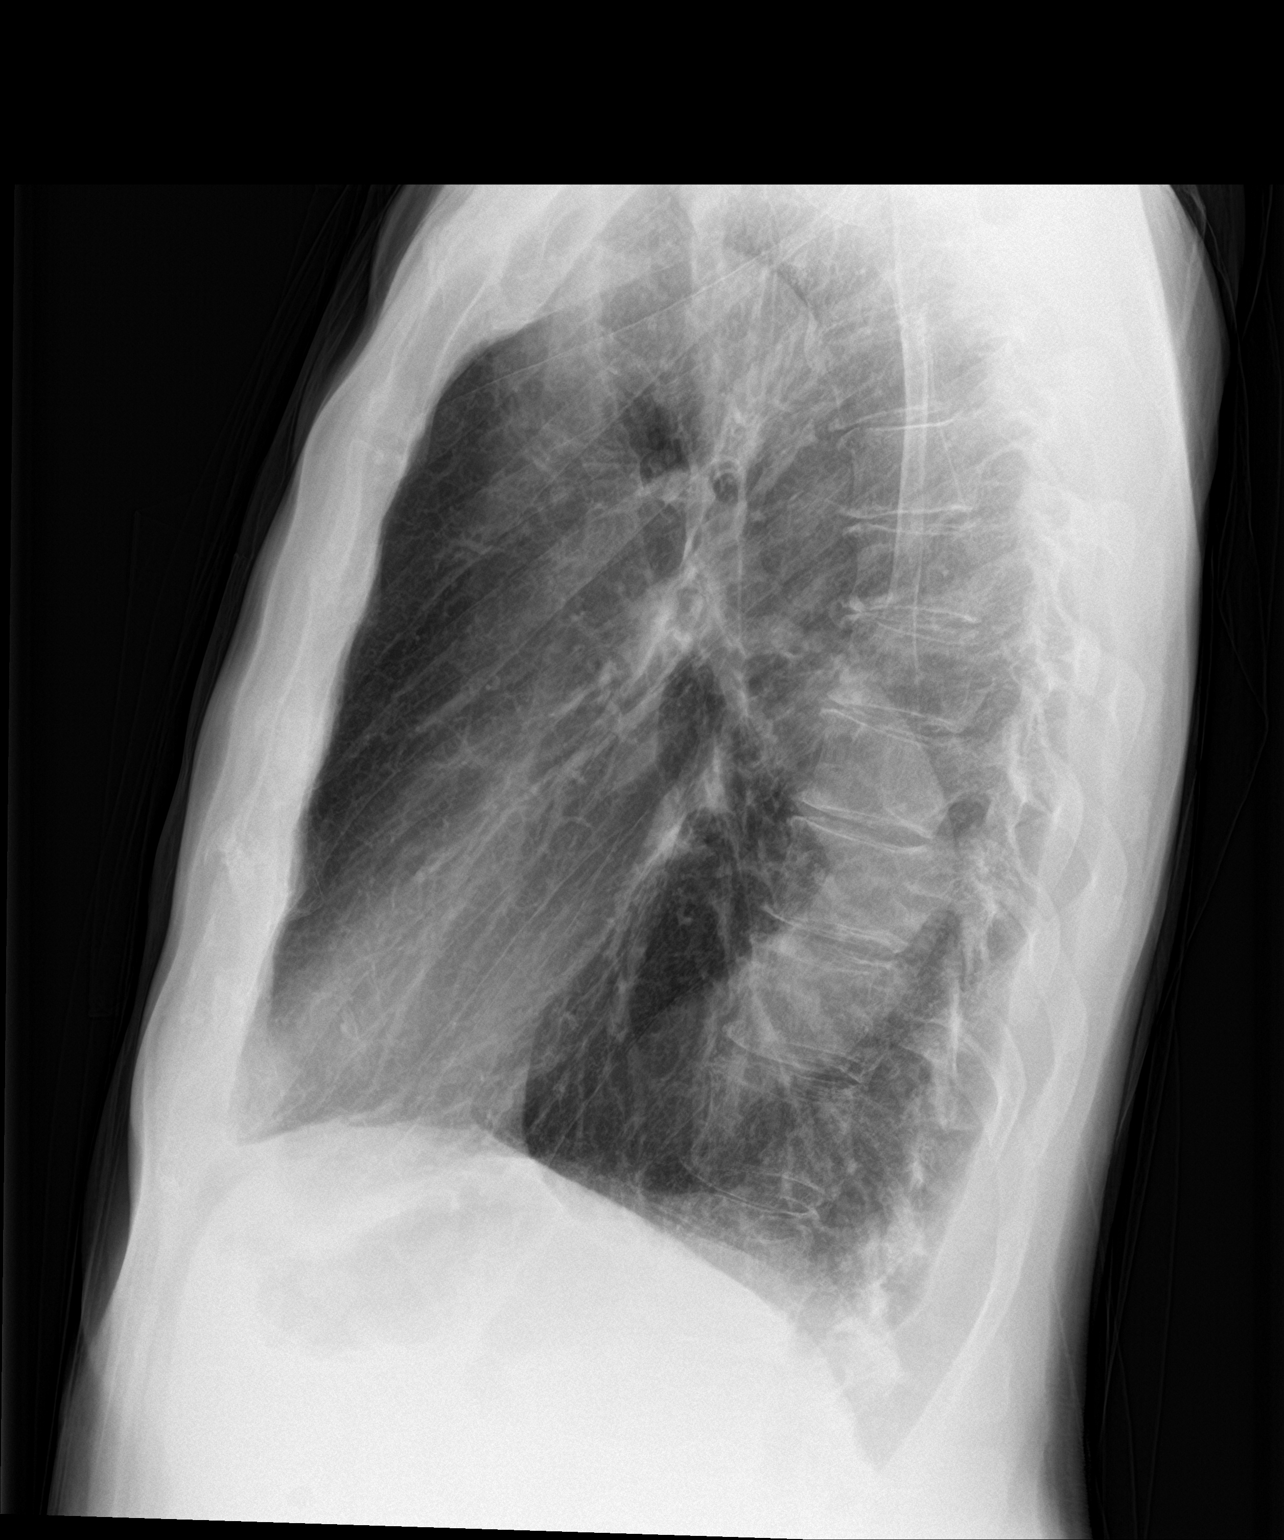

[2 of 2 positions shown; findings below may reference images not displayed]

FINDINGS: Lung volumes are normal. No consolidative airspace disease. No
pleural effusions. No pneumothorax. No pulmonary nodule or mass
noted. Pulmonary vasculature and the cardiomediastinal silhouette
are within normal limits. Atherosclerosis in the thoracic aorta.
IMPRESSION: 1.  No radiographic evidence of acute cardiopulmonary disease.
2. Aortic atherosclerosis.

## 2023-06-23 ENCOUNTER — Other Ambulatory Visit: Payer: Self-pay

## 2023-06-23 ENCOUNTER — Inpatient Hospital Stay: Payer: Medicare HMO | Attending: Hematology

## 2023-06-23 ENCOUNTER — Other Ambulatory Visit: Payer: Self-pay | Admitting: Hematology

## 2023-06-23 ENCOUNTER — Inpatient Hospital Stay: Payer: Medicare HMO

## 2023-06-23 ENCOUNTER — Inpatient Hospital Stay (HOSPITAL_BASED_OUTPATIENT_CLINIC_OR_DEPARTMENT_OTHER): Payer: Medicare HMO | Admitting: Oncology

## 2023-06-23 VITALS — BP 133/72 | HR 55 | Temp 97.8°F | Resp 18

## 2023-06-23 DIAGNOSIS — D469 Myelodysplastic syndrome, unspecified: Secondary | ICD-10-CM | POA: Diagnosis not present

## 2023-06-23 DIAGNOSIS — Z95828 Presence of other vascular implants and grafts: Secondary | ICD-10-CM

## 2023-06-23 DIAGNOSIS — D4622 Refractory anemia with excess of blasts 2: Secondary | ICD-10-CM | POA: Diagnosis present

## 2023-06-23 DIAGNOSIS — Z5111 Encounter for antineoplastic chemotherapy: Secondary | ICD-10-CM | POA: Insufficient documentation

## 2023-06-23 LAB — CBC WITH DIFFERENTIAL/PLATELET
Abs Immature Granulocytes: 0.01 10*3/uL (ref 0.00–0.07)
Basophils Absolute: 0 10*3/uL (ref 0.0–0.1)
Basophils Relative: 1 %
Eosinophils Absolute: 0.6 10*3/uL — ABNORMAL HIGH (ref 0.0–0.5)
Eosinophils Relative: 9 %
HCT: 37.3 % — ABNORMAL LOW (ref 39.0–52.0)
Hemoglobin: 12.1 g/dL — ABNORMAL LOW (ref 13.0–17.0)
Immature Granulocytes: 0 %
Lymphocytes Relative: 19 %
Lymphs Abs: 1.2 10*3/uL (ref 0.7–4.0)
MCH: 26.5 pg (ref 26.0–34.0)
MCHC: 32.4 g/dL (ref 30.0–36.0)
MCV: 81.6 fL (ref 80.0–100.0)
Monocytes Absolute: 0.8 10*3/uL (ref 0.1–1.0)
Monocytes Relative: 12 %
Neutro Abs: 3.9 10*3/uL (ref 1.7–7.7)
Neutrophils Relative %: 59 %
Platelets: 204 10*3/uL (ref 150–400)
RBC: 4.57 MIL/uL (ref 4.22–5.81)
RDW: 18.9 % — ABNORMAL HIGH (ref 11.5–15.5)
WBC: 6.4 10*3/uL (ref 4.0–10.5)
nRBC: 0 % (ref 0.0–0.2)

## 2023-06-23 LAB — COMPREHENSIVE METABOLIC PANEL WITH GFR
ALT: 15 U/L (ref 0–44)
AST: 22 U/L (ref 15–41)
Albumin: 3.8 g/dL (ref 3.5–5.0)
Alkaline Phosphatase: 64 U/L (ref 38–126)
Anion gap: 10 (ref 5–15)
BUN: 19 mg/dL (ref 8–23)
CO2: 23 mmol/L (ref 22–32)
Calcium: 9.5 mg/dL (ref 8.9–10.3)
Chloride: 105 mmol/L (ref 98–111)
Creatinine, Ser: 0.94 mg/dL (ref 0.61–1.24)
GFR, Estimated: >60 mL/min (ref >60)
Glucose, Bld: 117 mg/dL — ABNORMAL HIGH (ref 70–99)
Potassium: 3.5 mmol/L (ref 3.5–5.1)
Sodium: 138 mmol/L (ref 135–145)
Total Bilirubin: 1.3 mg/dL — ABNORMAL HIGH (ref 0.0–1.2)
Total Protein: 6.2 g/dL — ABNORMAL LOW (ref 6.5–8.1)

## 2023-06-23 LAB — MAGNESIUM: Magnesium: 2.1 mg/dL (ref 1.7–2.4)

## 2023-06-23 MED ORDER — SODIUM CHLORIDE 0.9% FLUSH
10.0000 mL | Freq: Once | INTRAVENOUS | Status: AC
  Administered 2023-06-23: 10 mL via INTRAVENOUS

## 2023-06-23 MED ORDER — HEPARIN SOD (PORK) LOCK FLUSH 100 UNIT/ML IV SOLN
500.0000 [IU] | Freq: Once | INTRAVENOUS | Status: AC | PRN
  Administered 2023-06-23: 500 [IU]

## 2023-06-23 MED ORDER — DEXAMETHASONE SODIUM PHOSPHATE 10 MG/ML IJ SOLN
10.0000 mg | Freq: Once | INTRAMUSCULAR | Status: AC
  Administered 2023-06-23: 10 mg via INTRAVENOUS
  Filled 2023-06-23: qty 1

## 2023-06-23 MED ORDER — SODIUM CHLORIDE 0.9% FLUSH
10.0000 mL | INTRAVENOUS | Status: DC | PRN
  Administered 2023-06-23: 10 mL

## 2023-06-23 MED ORDER — SODIUM CHLORIDE 0.9 % IV SOLN: Freq: Once | INTRAVENOUS | Status: AC

## 2023-06-23 MED ORDER — AZACITIDINE CHEMO INJECTION 100 MG
75.0000 mg/m2 | Freq: Once | INTRAMUSCULAR | Status: AC
  Administered 2023-06-23: 140 mg via INTRAVENOUS
  Filled 2023-06-23: qty 14

## 2023-06-23 MED ORDER — PALONOSETRON HCL INJECTION 0.25 MG/5ML
0.2500 mg | Freq: Once | INTRAVENOUS | Status: AC
  Administered 2023-06-23: 0.25 mg via INTRAVENOUS
  Filled 2023-06-23: qty 5

## 2023-06-23 NOTE — Patient Instructions (Signed)
 VISIT SUMMARY:  You came in today with concerns about unintentional weight loss and back pain. You mentioned that you have a good appetite and are not worried about the weight loss. You have lost about 2 pounds since your last visit. You also reported experiencing back pain and constipation, which you are managing with over-the-counter medications and prune juice. Overall, you are feeling very good and have no issues with your current cancer medication, venetoclax and infusion.  YOUR PLAN:  -MDS: MDS is a type of cancer that affects the blood and bone marrow. You are tolerating your medication, venetoclax, well and infusions. Your hemoglobin levels have improved to 12.1, and there are no concerns about your appetite or weight. You should continue your venetoclax treatment, monitor your weight and appetite, and manage constipation with Miralax and prune juice.   INSTRUCTIONS:  Please continue your current treatment plan and monitor your weight and appetite. If you experience any new symptoms or have concerns, schedule a follow-up appointment.

## 2023-06-23 NOTE — Patient Instructions (Signed)
 [facility] [place] - A DEPT OF [facility]  Discharge Instructions: Thank you for choosing [facility] to provide your oncology and hematology care.  If you have a lab appointment with the Cancer Center - please note that after April 8th, 2024, all labs will be drawn in the cancer center.  You do not have to check in or register with the main entrance as you have in the past but will complete your check-in in the cancer center.  Wear comfortable clothing and clothing appropriate for easy access to any Portacath or PICC line.   We strive to give you quality time with your provider. You may need to reschedule your appointment if you arrive late (15 or more minutes).  Arriving late affects you and other patients whose appointments are after yours.  Also, if you miss three or more appointments without notifying the office, you may be dismissed from the clinic at the provider's discretion.      For prescription refill requests, have your pharmacy contact our office and allow 72 hours for refills to be completed.    Today you received the following chemotherapy and/or immunotherapy agents Vidaza      To help prevent nausea and vomiting after your treatment, we encourage you to take your nausea medication as directed.  BELOW ARE SYMPTOMS THAT SHOULD BE REPORTED IMMEDIATELY: *FEVER GREATER THAN 100.4 F (38 C) OR HIGHER *CHILLS OR SWEATING *NAUSEA AND VOMITING THAT IS NOT CONTROLLED WITH YOUR NAUSEA MEDICATION *UNUSUAL SHORTNESS OF BREATH *UNUSUAL BRUISING OR BLEEDING *URINARY PROBLEMS (pain or burning when urinating, or frequent urination) *BOWEL PROBLEMS (unusual diarrhea, constipation, pain near the anus) TENDERNESS IN MOUTH AND THROAT WITH OR WITHOUT PRESENCE OF ULCERS (sore throat, sores in mouth, or a toothache) UNUSUAL RASH, SWELLING OR PAIN  UNUSUAL VAGINAL DISCHARGE OR ITCHING   Items with * indicate a potential emergency and should be followed up as  soon as possible or go to the Emergency Department if any problems should occur.  Please show the CHEMOTHERAPY ALERT CARD or IMMUNOTHERAPY ALERT CARD at check-in to the Emergency Department and triage nurse.  Should you have questions after your visit or need to cancel or reschedule your appointment, please contact [facility] [place] - A DEPT OF Eligha Bridegroom [facility] [phone]  and follow the prompts.  Office hours are 8:00 a.m. to 4:30 p.m. Monday - Friday. Please note that voicemails left after 4:00 p.m. may not be returned until the following business day.  We are closed weekends and major holidays. You have access to a nurse at all times for urgent questions. Please call the main number to the clinic [phone] and follow the prompts.  For any non-urgent questions, you may also contact your provider using MyChart. We now offer e-Visits for anyone 72 and older to request care online for non-urgent symptoms. For details visit [email or web address].   Also download the MyChart app! Go to the app store, search "MyChart", open the app, select [place], and log in with your MyChart username and password.

## 2023-06-23 NOTE — Progress Notes (Unsigned)
 [facility] [address] [place] [address]    Clinic Day:  06/23/2023  Referring physician: Aliene Beams, MD  Patient Care Team: Aliene Beams, MD as PCP - General (Family Medicine) Aliene Beams, MD (Family Medicine) [place] Bali, MD (Inactive) as Consulting Physician (Gastroenterology) Doreatha Massed, MD as Medical Oncologist (Hematology)   ASSESSMENT & PLAN:   Assessment: 1. MDS with EB 2: - Seen at the request of Dr. Tracie Harrier for cytopenias - BMBX on 06/04/2021: Hypercellular marrow with dyspoietic changes involving the granulocytic cell line and megakaryocytes.  Myeloblasts 8% on aspirate smears and 10% by flow.  Cytogenetics 73, XY (20).  MDS FISH panel normal. 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 - IPSS-M score of 0.12, moderate high risk.  Leukemia free survival is 2.3 years.  Overall survival 2.8 years. - Cycle 1 of azacitidine on 07/30/2021.  BMBX (01/04/2022): Overall improvement in blast count and cellularity.  Chromosome analysis and FISH panel normal.  Venetoclax 200 mg day 1 through 14 added with cycle 8 on 02/18/2022. - BMBX (06/05/2022): Slightly hypercellular for age with very mild dyspoietic changes at best associated with increased number of eosinophilic cells.  No increase in blast cells identified. - Azacitidine 5 days and venetoclax 200 mg days 1-14 every 6 weeks started on 07/22/2022   2. Social/family history: - He lives at home with his wife.  He swims about 40 minutes daily. 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  He also worked as a Geologist, engineering.  Non-smoker. - Paternal aunt had breast cancer and mother had pancreatic cancer.  3.  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 - He underwent thrombolytic therapy at [facility]. - He will followed up with vascular at [facility] and has been on Xarelto since then. Discontinued for hemorrhoidal bleeding     Plan: 1. MDS with EB 2: - He has completed 19 cycles of azacitidine and venetoclax. - In the last 6 weeks, denied any fevers. - He lost about 3 pounds despite appetite being good. - Reviewed labs today: Normal LFTs with bilirubin 1.3.  Creatinine normal.  CBC grossly normal with normal differential. - Recommend proceed with cycle 20 today with azacitidine 75 mg/m for 5 days and venetoclax 200 mg daily days 1-14. - RTC 6 weeks for follow-up.   2. Unprovoked left subclavian DVT: - Xarelto discontinued due to hemorrhoidal bleeding.  No evidence of recurrence.   3. Constipation: - Continue Colace daily and MiraLAX as needed.  This is well-controlled.  The total time spent in the appointment was 30 minutes encounter with patients including review of chart and various tests results, discussions about plan of care and coordination of care plan   All questions were answered. The patient knows to call the clinic with any problems, questions or concerns. No barriers to learning was detected.   Cindie Crumbly, MD   4/7/202511:32 AM  CHIEF COMPLAINT:   Diagnosis: MDS with EB 2    Cancer Staging  No matching staging information was found for the patient.    Prior Therapy: none  Current Therapy:  Azacitidine and venetoclax    HISTORY OF PRESENT ILLNESS:   Oncology History  Myelodysplastic syndrome (HCC)  06/17/2021 Initial Diagnosis   Myelodysplastic syndrome (HCC)   07/30/2021 - 10/30/2021 Chemotherapy   Patient is on Treatment Plan : MYELODYSPLASIA  Azacitidine IV D1-7 q28d     07/30/2021 -  Chemotherapy   Patient is on Treatment Plan : MYELODYSPLASIA  Azacitidine IV D1-5 q42d        INTERVAL HISTORY:   Chad Lamb is a 78 y.o. male presenting to clinic today for follow up of MDS with EB 2. He reports a good appetite and denies concern about the weight loss. He fluctuates in weight and has lost approximately 2 pounds since his last visit. He has been experiencing back pain, which  is a recurring issue for him. He also reports constipation, which is managed with over-the-counter medications and prune juice. Despite these issues, he reports feeling very good overall. He is currently on venetoclax and reports no issues with this medication.  Today, he states that he is doing well overall. His appetite level is at 100%. His energy level is at 75%.  PAST MEDICAL HISTORY:   Past Medical History: Past Medical History:  Diagnosis Date   Allergy    Anxiety    Arthritis    Asthma    Cataract    Clotting disorder (HCC)    DVT (deep venous thrombosis) (HCC)    unknown etiology. [facility]   GERD (gastroesophageal reflux disease)    History of dental surgery    feb 2023   Hyperlipidemia    Hypertension    MDS (myelodysplastic syndrome), high grade (HCC) 06/17/2021   Pneumonia    Port-A-Cath in place 07/30/2021    Surgical History: Past Surgical History:  Procedure Laterality Date   CATARACT EXTRACTION W/PHACO Right 11/24/2017   Procedure: CATARACT EXTRACTION PHACO AND INTRAOCULAR LENS PLACEMENT RIGHT EYE;  Surgeon: Gemma Payor, MD;  Location: [facility];  Service: Ophthalmology;  Laterality: Right;  CDE: 8.61   CATARACT EXTRACTION W/PHACO Left 12/29/2017   Procedure: CATARACT EXTRACTION PHACO AND INTRAOCULAR LENS PLACEMENT (IOC);  Surgeon: Gemma Payor, MD;  Location: [facility];  Service: Ophthalmology;  Laterality: Left;  CDE: 7.21   GANGLION CYST EXCISION     HERNIA REPAIR     bilateral   IR BONE MARROW BIOPSY & ASPIRATION  06/05/2022   PARS PLANA VITRECTOMY Left 05/17/2021   Procedure: PARS PLANA VITRECTOMY 25 GAUGE WITH ANTIBIOTIC INJECTION  AND ENDOLASER LEFT EYE;  Surgeon: Carmela Rima, MD;  Location: [facility] OR;  Service: Ophthalmology;  Laterality: Left;   PORTACATH PLACEMENT Right 07/09/2021   Procedure: INSERTION PORT-A-CATH;  Surgeon: Franky Macho, MD;  Location: [facility];  Service: General;  Laterality: Right;   TONSILLECTOMY     VASCULAR SURGERY      Social  History: Social History   Socioeconomic History   Marital status: Married    Spouse name: Harriett Sine   Number of children: 0   Years of education: 13   Highest education level: High school graduate  Occupational History   Not on file  Tobacco Use   Smoking status: Former    Types: Pipe    Quit date: 03/21/1966    Years since quitting: 57.2   Smokeless tobacco: Never  Vaping Use   Vaping status: Never Used  Substance and Sexual Activity   Alcohol use: Yes    Alcohol/week: 1.0 standard drink of alcohol    Types: 1 Cans of beer per week    Comment: 1 can of beer a week   Drug use: Never   Sexual activity: Not Currently  Other Topics Concern   Not on file  Social History Narrative   ** Merged History Encounter **       Grew up in [place], [place] and [place].  Retired.  Worked in [place]. 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    Social Drivers of Corporate investment banker Strain: Low Risk  (01/23/2017)   Overall Financial Resource Strain (CARDIA)    Difficulty of Paying Living Expenses: Not hard at all  Food Insecurity: No Food Insecurity (01/23/2017)   Hunger Vital Sign    Worried About Running Out of Food in the Last Year: Never true    Ran Out of Food in the Last Year: Never true  Transportation Needs: No Transportation Needs (01/23/2017)   PRAPARE - Administrator, Civil Service (Medical): No    Lack of Transportation (Non-Medical): No  Physical Activity: Unknown (01/23/2017)   Exercise Vital Sign    Days of Exercise per Week: 3 days    Minutes of Exercise per Session: Not on file  Stress: Stress Concern Present (01/23/2017)   [employer] of Occupational Health - Occupational Stress Questionnaire    Feeling of Stress : To some extent  Social Connections: Somewhat Isolated (01/23/2017)   Social Connection and Isolation Panel [NHANES]    Frequency of Communication with Friends and Family: More than three  times a week    Frequency of Social Gatherings with Friends and Family: More than three times a week    Attends Religious Services: Never    Database administrator or Organizations: No    Attends Banker Meetings: Never    Marital Status: Married  Catering manager Violence: Not At Risk (01/23/2017)   Humiliation, Afraid, Rape, and Kick questionnaire    Fear of Current or Ex-Partner: No    Emotionally Abused: No    Physically Abused: No    Sexually Abused: No    Family History: Family History  Problem Relation Age of Onset   Cancer Mother        pancreatic   Arthritis Father    Early death Father 71       Lung infiltrate   Colon cancer Neg Hx    Colon polyps Neg Hx     Current Medications:  Current Outpatient Medications:    albuterol (PROVENTIL HFA;VENTOLIN HFA) 108 (90 Base) MCG/ACT inhaler, Inhale 1-2 puffs into the lungs every 6 (six) hours as needed for wheezing or shortness of breath., Disp: , Rfl:    ALPRAZolam (XANAX) 0.5 MG tablet, Take 0.5 mg by mouth 2 (two) times daily as needed for anxiety., Disp: , Rfl:    amLODipine (NORVASC) 2.5 MG tablet, Take 2.5 mg by mouth daily., Disp: , Rfl:    azaCITIDine 5 mg/2 mLs in lactated ringers infusion, Inject into the vein daily. Days 1-7 every 28 days, Disp: , Rfl:    DENTA 5000 PLUS 1.1 % CREA dental cream, Take by mouth., Disp: , Rfl:    docusate sodium (COLACE) 100 MG capsule, 1 capsule Orally daily (3 daily when on chemo), Disp: , Rfl:    fluticasone furoate-vilanterol (BREO ELLIPTA) 200-25 MCG/ACT AEPB, Inhale 1 puff into the lungs daily., Disp: , Rfl:    Lactulose 20 GM/30ML SOLN, Take 30 mLs (20 g total) by mouth at bedtime., Disp: 473 mL, Rfl: 3   Lidocaine-Hydrocort, Perianal, 3-0.5 % CREA, Apply topically 2 (two) times daily as needed., Disp: , Rfl:    Lidocaine-Hydrocortisone Ace 3-0.5 % CREA, Apply 1 application topically as directed. (Patient taking differently: Apply 1 application  topically 2 (two)  times daily as needed (hemorrhoids).), Disp: 30 Tube, Rfl:  11   lidocaine-prilocaine (EMLA) cream, Apply a SMALL AMOUNT TO port a cath site (DO not RUB in) AND cover with PLASTIC WRAP ONE hour prior TO infusion appointment, Disp: 30 g, Rfl: 3   loratadine (CLARITIN) 10 MG tablet, Take 10 mg by mouth daily., Disp: , Rfl:    Lutein 40 MG CAPS, Take 40 mg by mouth daily., Disp: , Rfl:    Multiple Vitamins-Minerals (CENTRUM ADULTS PO), Take 1 tablet by mouth daily., Disp: , Rfl:    nystatin cream (MYCOSTATIN), Apply 1 application. topically 2 (two) times daily as needed for dry skin., Disp: , Rfl:    ofloxacin (OCUFLOX) 0.3 % ophthalmic solution, Place 1 drop into the left eye See admin instructions. 1 drop to left eye 4 x daily for 7 days after monthly procedure., Disp: , Rfl:    omeprazole (PRILOSEC OTC) 20 MG tablet, Take 5-10 mg by mouth daily., Disp: , Rfl:    sildenafil (VIAGRA) 50 MG tablet, Take 50 mg by mouth daily as needed for erectile dysfunction., Disp: , Rfl:    traMADol (ULTRAM) 50 MG tablet, Take 1 tablet (50 mg total) by mouth every 6 (six) hours as needed., Disp: 15 tablet, Rfl: 0   venetoclax (VENCLEXTA) 100 MG tablet, Take 2 tablets (200 mg total) by mouth daily. Take for 14 days, then hold for 14 days. Repeat every 28 days. Take with a meal and a full glass of water., Disp: 28 tablet, Rfl: 1 No current facility-administered medications for this visit.  Facility-Administered Medications Ordered in Other Visits:    sodium chloride flush (NS) 0.9 % injection 10 mL, 10 mL, Intracatheter, PRN, Doreatha Massed, MD, 10 mL at 06/12/22 1606   Allergies: Allergies  Allergen Reactions   Cephalosporins Itching   Amoxapine And Related Itching and Rash    Has patient had a PCN reaction causing immediate rash, facial/tongue/throat swelling, SOB or lightheadedness with hypotension: No Has patient had a PCN reaction causing severe rash involving mucus membranes or skin necrosis: No Has  patient had a PCN reaction that required hospitalization: No Has patient had a PCN reaction occurring within the last 10 years: No If all of the above answers are "NO", then may proceed with Cephalosporin use.     Amoxicillin Rash   Cephalexin Rash   Clindamycin/Lincomycin Itching and Rash   Sulfamethoxazole Itching and Rash   Trimethoprim Rash    REVIEW OF SYSTEMS:   Review of Systems  Constitutional:  Negative for chills, fatigue and fever.  HENT:   Negative for lump/mass, mouth sores, nosebleeds, sore throat and trouble swallowing.   Eyes:  Negative for eye problems.  Respiratory:  Negative for cough and shortness of breath.   Cardiovascular:  Negative for chest pain, leg swelling and palpitations.  Gastrointestinal:  Positive for constipation. Negative for abdominal pain, diarrhea, nausea and vomiting.  Genitourinary:  Negative for bladder incontinence, difficulty urinating, dysuria, frequency, hematuria and nocturia.   Musculoskeletal:  Positive for back pain. Negative for arthralgias, flank pain, myalgias and neck pain.  Skin:  Negative for itching and rash.  Neurological:  Negative for dizziness, headaches and numbness.  Hematological:  Does not bruise/bleed easily.  Psychiatric/Behavioral:  Negative for depression, sleep disturbance and suicidal ideas. The patient is not nervous/anxious.   All other systems reviewed and are negative.    VITALS:   There were no vitals taken for this visit.  Wt Readings from Last 3 Encounters:  06/23/23 150 lb 5.7 oz (68.2 kg)  05/13/23  152 lb 6.4 oz (69.1 kg)  05/12/23 149 lb 0.5 oz (67.6 kg)    There is no height or weight on file to calculate BMI.  Performance status (ECOG): 1 - Symptomatic but completely ambulatory  PHYSICAL EXAM:   Physical Exam Vitals and nursing note reviewed. Exam conducted with a chaperone present.  Constitutional:      Appearance: Normal appearance.  Cardiovascular:     Rate and Rhythm: Normal rate and  regular rhythm.     Pulses: Normal pulses.     Heart sounds: Normal heart sounds.  Pulmonary:     Effort: Pulmonary effort is normal.     Breath sounds: Normal breath sounds.  Abdominal:     Palpations: Abdomen is soft. There is no hepatomegaly, splenomegaly or mass.     Tenderness: There is no abdominal tenderness.  Musculoskeletal:     Right lower leg: No edema.     Left lower leg: No edema.  Lymphadenopathy:     Cervical: No cervical adenopathy.     Right cervical: No superficial, deep or posterior cervical adenopathy.    Left cervical: No superficial, deep or posterior cervical adenopathy.     Upper Body:     Right upper body: No supraclavicular or axillary adenopathy.     Left upper body: No supraclavicular or axillary adenopathy.  Neurological:     General: No focal deficit present.     Mental Status: He is alert and oriented to person, place, and time.  Psychiatric:        Mood and Affect: Mood normal.        Behavior: Behavior normal.    LABS:   CBC     Component Value Date/Time   WBC 6.4 06/23/2023 1034   RBC 4.57 06/23/2023 1034   HGB 12.1 (L) 06/23/2023 1034   HCT 37.3 (L) 06/23/2023 1034   PLT 204 06/23/2023 1034   MCV 81.6 06/23/2023 1034   MCH 26.5 06/23/2023 1034   MCHC 32.4 06/23/2023 1034   RDW 18.9 (H) 06/23/2023 1034   LYMPHSABS 1.2 06/23/2023 1034   MONOABS 0.8 06/23/2023 1034   EOSABS 0.6 (H) 06/23/2023 1034   BASOSABS 0.0 06/23/2023 1034    CMP      Component Value Date/Time   NA 138 06/23/2023 1034   K 3.5 06/23/2023 1034   CL 105 06/23/2023 1034   CO2 23 06/23/2023 1034   GLUCOSE 117 (H) 06/23/2023 1034   BUN 19 06/23/2023 1034   CREATININE 0.94 06/23/2023 1034   CREATININE 0.98 02/12/2017 1421   CALCIUM 9.5 06/23/2023 1034   PROT 6.2 (L) 06/23/2023 1034   ALBUMIN 3.8 06/23/2023 1034   AST 22 06/23/2023 1034   ALT 15 06/23/2023 1034   ALKPHOS 64 06/23/2023 1034   BILITOT 1.3 (H) 06/23/2023 1034   GFRNONAA >60 06/23/2023 1034    GFRNONAA 77 02/12/2017 1421   GFRAA >60 11/19/2017 1532   GFRAA 89 02/12/2017 1421    Lab Results  Component Value Date   TOTALPROTELP 6.3 01/05/2021   ALBUMINELP 3.3 01/05/2021   A1GS 0.3 01/05/2021   A2GS 0.8 01/05/2021   BETS 1.0 01/05/2021   GAMS 1.0 01/05/2021   MSPIKE Not Observed 01/05/2021   SPEI Comment 01/05/2021   No results found for: "TIBC", "FERRITIN", "IRONPCTSAT" Lab Results  Component Value Date   LDH 147 11/20/2021   LDH 143 10/22/2021   LDH 144 10/16/2021

## 2023-06-23 NOTE — Progress Notes (Signed)
 Patient presents today for Vidaza infusion per providers order.  Vital signs and labs reviewed by MD.  Message received from Dr. Anders Simmonds patient okay for treatment.    Treatment given today per MD orders.  Stable during infusion without adverse affects.  Vital signs stable.  No complaints at this time.  Discharge from clinic ambulatory in stable condition.  Alert and oriented X 3.  Follow up with [facility] as scheduled.

## 2023-06-24 ENCOUNTER — Encounter: Payer: Self-pay | Admitting: Hematology

## 2023-06-24 ENCOUNTER — Inpatient Hospital Stay: Payer: Medicare HMO

## 2023-06-24 ENCOUNTER — Other Ambulatory Visit: Payer: Self-pay

## 2023-06-24 VITALS — BP 133/74 | HR 62 | Temp 97.0°F | Resp 18

## 2023-06-24 DIAGNOSIS — Z5111 Encounter for antineoplastic chemotherapy: Secondary | ICD-10-CM | POA: Diagnosis not present

## 2023-06-24 DIAGNOSIS — Z95828 Presence of other vascular implants and grafts: Secondary | ICD-10-CM

## 2023-06-24 DIAGNOSIS — D469 Myelodysplastic syndrome, unspecified: Secondary | ICD-10-CM

## 2023-06-24 MED ORDER — DEXAMETHASONE SODIUM PHOSPHATE 10 MG/ML IJ SOLN
10.0000 mg | Freq: Once | INTRAMUSCULAR | Status: AC
  Administered 2023-06-24: 10 mg via INTRAVENOUS
  Filled 2023-06-24: qty 1

## 2023-06-24 MED ORDER — SODIUM CHLORIDE 0.9% FLUSH
10.0000 mL | INTRAVENOUS | Status: DC | PRN
  Administered 2023-06-24: 10 mL

## 2023-06-24 MED ORDER — SODIUM CHLORIDE 0.9 % IV SOLN
75.0000 mg/m2 | Freq: Once | INTRAVENOUS | Status: AC
  Administered 2023-06-24: 140 mg via INTRAVENOUS
  Filled 2023-06-24: qty 14

## 2023-06-24 MED ORDER — HEPARIN SOD (PORK) LOCK FLUSH 100 UNIT/ML IV SOLN
500.0000 [IU] | Freq: Once | INTRAVENOUS | Status: AC | PRN
  Administered 2023-06-24: 500 [IU]

## 2023-06-24 MED ORDER — SODIUM CHLORIDE 0.9 % IV SOLN: Freq: Once | INTRAVENOUS | Status: AC

## 2023-06-24 NOTE — Progress Notes (Signed)

## 2023-06-24 NOTE — Patient Instructions (Signed)

## 2023-06-25 ENCOUNTER — Inpatient Hospital Stay: Payer: Medicare HMO

## 2023-06-25 VITALS — BP 140/81 | HR 56 | Temp 97.5°F | Resp 16

## 2023-06-25 DIAGNOSIS — Z95828 Presence of other vascular implants and grafts: Secondary | ICD-10-CM

## 2023-06-25 DIAGNOSIS — D469 Myelodysplastic syndrome, unspecified: Secondary | ICD-10-CM

## 2023-06-25 DIAGNOSIS — Z5111 Encounter for antineoplastic chemotherapy: Secondary | ICD-10-CM | POA: Diagnosis not present

## 2023-06-25 MED ORDER — DEXAMETHASONE SODIUM PHOSPHATE 10 MG/ML IJ SOLN
10.0000 mg | Freq: Once | INTRAMUSCULAR | Status: AC
  Administered 2023-06-25: 10 mg via INTRAVENOUS
  Filled 2023-06-25: qty 1

## 2023-06-25 MED ORDER — SODIUM CHLORIDE 0.9% FLUSH
10.0000 mL | INTRAVENOUS | Status: DC | PRN
Start: 2023-06-25 — End: 2023-06-25
  Administered 2023-06-25: 10 mL

## 2023-06-25 MED ORDER — PALONOSETRON HCL INJECTION 0.25 MG/5ML
0.2500 mg | Freq: Once | INTRAVENOUS | Status: AC
  Administered 2023-06-25: 0.25 mg via INTRAVENOUS
  Filled 2023-06-25: qty 5

## 2023-06-25 MED ORDER — SODIUM CHLORIDE 0.9 % IV SOLN: Freq: Once | INTRAVENOUS | Status: AC

## 2023-06-25 MED ORDER — SODIUM CHLORIDE 0.9 % IV SOLN
75.0000 mg/m2 | Freq: Once | INTRAVENOUS | Status: AC
  Administered 2023-06-25: 140 mg via INTRAVENOUS
  Filled 2023-06-25: qty 14

## 2023-06-25 MED ORDER — HEPARIN SOD (PORK) LOCK FLUSH 100 UNIT/ML IV SOLN
500.0000 [IU] | Freq: Once | INTRAVENOUS | Status: AC | PRN
  Administered 2023-06-25: 500 [IU]

## 2023-06-25 NOTE — Progress Notes (Signed)
 Patients port flushed without difficulty.  Good blood return noted with no bruising or swelling noted at site.  Band aid applied.  VSS with discharge and left in satisfactory condition with no s/s of distress noted.

## 2023-06-25 NOTE — Patient Instructions (Signed)
 [facility] [place] [facility]  Discharge Instructions: Thank you for choosing [facility] to provide your oncology and hematology care.  If you have a lab appointment with the Cancer Center - please note that after April 8th, 2024, all labs will be drawn in the cancer center.  You do not have to check in or register with the main entrance as you have in the past but will complete your check-in in the cancer center.  Wear comfortable clothing and clothing appropriate for easy access to any Portacath or PICC line.   We strive to give you quality time with your provider. You may need to reschedule your appointment if you arrive late (15 or more minutes).  Arriving late affects you and other patients whose appointments are after yours.  Also, if you miss three or more appointments without notifying the office, you may be dismissed from the clinic at the provider's discretion.      For prescription refill requests, have your pharmacy contact our office and allow 72 hours for refills to be completed.    Today you received the following chemotherapy and/or immunotherapy agents vidaza.       To help prevent nausea and vomiting after your treatment, we encourage you to take your nausea medication as directed.  BELOW ARE SYMPTOMS THAT SHOULD BE REPORTED IMMEDIATELY: *FEVER GREATER THAN 100.4 F (38 C) OR HIGHER *CHILLS OR SWEATING *NAUSEA AND VOMITING THAT IS NOT CONTROLLED WITH YOUR NAUSEA MEDICATION *UNUSUAL SHORTNESS OF BREATH *UNUSUAL BRUISING OR BLEEDING *URINARY PROBLEMS (pain or burning when urinating, or frequent urination) *BOWEL PROBLEMS (unusual diarrhea, constipation, pain near the anus) TENDERNESS IN MOUTH AND THROAT WITH OR WITHOUT PRESENCE OF ULCERS (sore throat, sores in mouth, or a toothache) UNUSUAL RASH, SWELLING OR PAIN  UNUSUAL VAGINAL DISCHARGE OR ITCHING   Items with * indicate a potential emergency and should be followed up  as soon as possible or go to the Emergency Department if any problems should occur.  Please show the CHEMOTHERAPY ALERT CARD or IMMUNOTHERAPY ALERT CARD at check-in to the Emergency Department and triage nurse.  Should you have questions after your visit or need to cancel or reschedule your appointment, please contact [facility] [place] - A DEPT OF [facility]ideg[facility] [phone]  and follow the prompts.  Office hours are 8:00 a.m. to 4:30 p.m. Monday - Friday. Please note that voicemails left after 4:00 p.m. may not be returned until the following business day.  We are closed weekends and major holidays. You have access to a nurse at all times for urgent questions. Please call the main number to the clinic [phone] and follow the prompts.  For any non-urgent questions, you may also contact your provider using MyChart. We now offer e-Visits for anyone 8 and older to request care online for non-urgent symptoms. For details visit [email or web address].   Also download the MyChart app! Go to the app store, search "MyChart", open the app, select [place], and log in with your MyChart username and password.

## 2023-06-26 ENCOUNTER — Inpatient Hospital Stay: Payer: Medicare HMO

## 2023-06-26 VITALS — BP 159/86 | HR 64 | Temp 97.9°F | Resp 16

## 2023-06-26 DIAGNOSIS — Z95828 Presence of other vascular implants and grafts: Secondary | ICD-10-CM

## 2023-06-26 DIAGNOSIS — Z5111 Encounter for antineoplastic chemotherapy: Secondary | ICD-10-CM | POA: Diagnosis not present

## 2023-06-26 DIAGNOSIS — D469 Myelodysplastic syndrome, unspecified: Secondary | ICD-10-CM

## 2023-06-26 MED ORDER — SODIUM CHLORIDE 0.9 % IV SOLN: Freq: Once | INTRAVENOUS | Status: AC

## 2023-06-26 MED ORDER — SODIUM CHLORIDE 0.9% FLUSH
10.0000 mL | INTRAVENOUS | Status: DC | PRN
  Administered 2023-06-26: 10 mL

## 2023-06-26 MED ORDER — SODIUM CHLORIDE 0.9 % IV SOLN
75.0000 mg/m2 | Freq: Once | INTRAVENOUS | Status: AC
  Administered 2023-06-26: 140 mg via INTRAVENOUS
  Filled 2023-06-26: qty 14

## 2023-06-26 MED ORDER — HEPARIN SOD (PORK) LOCK FLUSH 100 UNIT/ML IV SOLN
500.0000 [IU] | Freq: Once | INTRAVENOUS | Status: AC | PRN
  Administered 2023-06-26: 500 [IU]

## 2023-06-26 MED ORDER — DEXAMETHASONE SODIUM PHOSPHATE 10 MG/ML IJ SOLN
10.0000 mg | Freq: Once | INTRAMUSCULAR | Status: AC
  Administered 2023-06-26: 10 mg via INTRAVENOUS
  Filled 2023-06-26: qty 1

## 2023-06-26 MED FILL — Azacitidine For Inj 100 MG: INTRAMUSCULAR | Qty: 14 | Status: AC

## 2023-06-26 NOTE — Progress Notes (Signed)
 Patient tolerated therapy with no complaints voiced.  Side effects with management reviewed with understanding verbalized.  Port site clean and dry with no bruising or swelling noted at site.  Good blood return noted before and after administration of therapy.  Band aid applied.  Patient left in satisfactory condition with VSS and no s/s of distress noted.

## 2023-06-26 NOTE — Patient Instructions (Signed)
 [facility] [place] [facility]  Discharge Instructions: Thank you for choosing [facility] to provide your oncology and hematology care.  If you have a lab appointment with the Cancer Center - please note that after April 8th, 2024, all labs will be drawn in the cancer center.  You do not have to check in or register with the main entrance as you have in the past but will complete your check-in in the cancer center.  Wear comfortable clothing and clothing appropriate for easy access to any Portacath or PICC line.   We strive to give you quality time with your provider. You may need to reschedule your appointment if you arrive late (15 or more minutes).  Arriving late affects you and other patients whose appointments are after yours.  Also, if you miss three or more appointments without notifying the office, you may be dismissed from the clinic at the provider's discretion.      For prescription refill requests, have your pharmacy contact our office and allow 72 hours for refills to be completed.    Today you received the following chemotherapy and/or immunotherapy agents vidaza.       To help prevent nausea and vomiting after your treatment, we encourage you to take your nausea medication as directed.  BELOW ARE SYMPTOMS THAT SHOULD BE REPORTED IMMEDIATELY: *FEVER GREATER THAN 100.4 F (38 C) OR HIGHER *CHILLS OR SWEATING *NAUSEA AND VOMITING THAT IS NOT CONTROLLED WITH YOUR NAUSEA MEDICATION *UNUSUAL SHORTNESS OF BREATH *UNUSUAL BRUISING OR BLEEDING *URINARY PROBLEMS (pain or burning when urinating, or frequent urination) *BOWEL PROBLEMS (unusual diarrhea, constipation, pain near the anus) TENDERNESS IN MOUTH AND THROAT WITH OR WITHOUT PRESENCE OF ULCERS (sore throat, sores in mouth, or a toothache) UNUSUAL RASH, SWELLING OR PAIN  UNUSUAL VAGINAL DISCHARGE OR ITCHING   Items with * indicate a potential emergency and should be followed up  as soon as possible or go to the Emergency Department if any problems should occur.  Please show the CHEMOTHERAPY ALERT CARD or IMMUNOTHERAPY ALERT CARD at check-in to the Emergency Department and triage nurse.  Should you have questions after your visit or need to cancel or reschedule your appointment, please contact [facility] [place] - A DEPT OF [facility]ideg[facility] [phone]  and follow the prompts.  Office hours are 8:00 a.m. to 4:30 p.m. Monday - Friday. Please note that voicemails left after 4:00 p.m. may not be returned until the following business day.  We are closed weekends and major holidays. You have access to a nurse at all times for urgent questions. Please call the main number to the clinic [phone] and follow the prompts.  For any non-urgent questions, you may also contact your provider using MyChart. We now offer e-Visits for anyone 8 and older to request care online for non-urgent symptoms. For details visit [email or web address].   Also download the MyChart app! Go to the app store, search "MyChart", open the app, select [place], and log in with your MyChart username and password.

## 2023-06-27 ENCOUNTER — Inpatient Hospital Stay: Payer: Medicare HMO

## 2023-06-27 VITALS — BP 123/82 | HR 60 | Temp 97.9°F | Resp 16

## 2023-06-27 DIAGNOSIS — Z95828 Presence of other vascular implants and grafts: Secondary | ICD-10-CM

## 2023-06-27 DIAGNOSIS — D469 Myelodysplastic syndrome, unspecified: Secondary | ICD-10-CM

## 2023-06-27 DIAGNOSIS — Z5111 Encounter for antineoplastic chemotherapy: Secondary | ICD-10-CM | POA: Diagnosis not present

## 2023-06-27 MED ORDER — PALONOSETRON HCL INJECTION 0.25 MG/5ML
0.2500 mg | Freq: Once | INTRAVENOUS | Status: AC
  Administered 2023-06-27: 0.25 mg via INTRAVENOUS
  Filled 2023-06-27: qty 5

## 2023-06-27 MED ORDER — HEPARIN SOD (PORK) LOCK FLUSH 100 UNIT/ML IV SOLN
500.0000 [IU] | Freq: Once | INTRAVENOUS | Status: AC | PRN
  Administered 2023-06-27: 500 [IU]

## 2023-06-27 MED ORDER — SODIUM CHLORIDE 0.9% FLUSH
10.0000 mL | INTRAVENOUS | Status: DC | PRN
  Administered 2023-06-27: 10 mL

## 2023-06-27 MED ORDER — DEXAMETHASONE SODIUM PHOSPHATE 10 MG/ML IJ SOLN
10.0000 mg | Freq: Once | INTRAMUSCULAR | Status: AC
  Administered 2023-06-27: 10 mg via INTRAVENOUS
  Filled 2023-06-27: qty 1

## 2023-06-27 MED ORDER — SODIUM CHLORIDE 0.9 % IV SOLN: Freq: Once | INTRAVENOUS | Status: AC

## 2023-06-27 MED ORDER — SODIUM CHLORIDE 0.9 % IV SOLN
75.0000 mg/m2 | Freq: Once | INTRAVENOUS | Status: AC
  Administered 2023-06-27: 140 mg via INTRAVENOUS
  Filled 2023-06-27: qty 14

## 2023-06-27 NOTE — Patient Instructions (Signed)
 [facility] [place] - A DEPT OF [facility]  Discharge Instructions: Thank you for choosing [facility] to provide your oncology and hematology care.  If you have a lab appointment with the Cancer Center - please note that after April 8th, 2024, all labs will be drawn in the cancer center.  You do not have to check in or register with the main entrance as you have in the past but will complete your check-in in the cancer center.  Wear comfortable clothing and clothing appropriate for easy access to any Portacath or PICC line.   We strive to give you quality time with your provider. You may need to reschedule your appointment if you arrive late (15 or more minutes).  Arriving late affects you and other patients whose appointments are after yours.  Also, if you miss three or more appointments without notifying the office, you may be dismissed from the clinic at the provider's discretion.      For prescription refill requests, have your pharmacy contact our office and allow 72 hours for refills to be complete Today you received the following chemotherapy and/or immunotherapy agents azacitadine    To help prevent nausea and vomiting after your treatment, we encourage you to take your nausea medication as directed.  BELOW ARE SYMPTOMS THAT SHOULD BE REPORTED IMMEDIATELY: *FEVER GREATER THAN 100.4 F (38 C) OR HIGHER *CHILLS OR SWEATING *NAUSEA AND VOMITING THAT IS NOT CONTROLLED WITH YOUR NAUSEA MEDICATION *UNUSUAL SHORTNESS OF BREATH *UNUSUAL BRUISING OR BLEEDING *URINARY PROBLEMS (pain or burning when urinating, or frequent urination) *BOWEL PROBLEMS (unusual diarrhea, constipation, pain near the anus) TENDERNESS IN MOUTH AND THROAT WITH OR WITHOUT PRESENCE OF ULCERS (sore throat, sores in mouth, or a toothache) UNUSUAL RASH, SWELLING OR PAIN  UNUSUAL VAGINAL DISCHARGE OR ITCHING   Items with * indicate a potential emergency and should be followed up as  soon as possible or go to the Emergency Department if any problems should occur.  Please show the CHEMOTHERAPY ALERT CARD or IMMUNOTHERAPY ALERT CARD at check-in to the Emergency Department and triage nurse.  Should you have questions after your visit or need to cancel or reschedule your appointment, please contact [facility] [place] - A DEPT OF [facility] [phone]  and follow the prompts.  Office hours are 8:00 a.m. to 4:30 p.m. Monday - Friday. Please note that voicemails left after 4:00 p.m. may not be returned until the following business day.  We are closed weekends and major holidays. You have access to a nurse at all times for urgent questions. Please call the main number to the clinic [phone] and follow the prompts.  For any non-urgent questions, you may also contact your provider using MyChart. We now offer e-Visits for anyone 48 and older to request care online for non-urgent symptoms. For details visit [email or web address].   Also download the MyChart app! Go to the app store, search "MyChart", open the app, select [place], and log in with your MyChart username and password.

## 2023-06-27 NOTE — Progress Notes (Signed)
Treatment given per orders. Patient tolerated it well without problems. Vitals stable and discharged home from clinic ambulatory. Follow up as scheduled.  

## 2023-07-09 ENCOUNTER — Other Ambulatory Visit: Payer: Self-pay

## 2023-07-14 ENCOUNTER — Other Ambulatory Visit: Payer: Self-pay

## 2023-07-16 ENCOUNTER — Other Ambulatory Visit: Payer: Self-pay

## 2023-07-16 ENCOUNTER — Other Ambulatory Visit: Payer: Self-pay | Admitting: Hematology

## 2023-07-16 DIAGNOSIS — D469 Myelodysplastic syndrome, unspecified: Secondary | ICD-10-CM

## 2023-07-16 MED ORDER — VENETOCLAX 100 MG PO TABS
200.0000 mg | ORAL_TABLET | Freq: Every day | ORAL | 1 refills | Status: DC
  Filled 2023-07-16: qty 28, 42d supply, fill #0
  Filled 2023-08-22: qty 28, 42d supply, fill #1

## 2023-07-16 MED ORDER — VENETOCLAX 100 MG PO TABS
200.0000 mg | ORAL_TABLET | Freq: Every day | ORAL | 1 refills | Status: DC
  Filled 2023-07-16: qty 28, 28d supply, fill #0

## 2023-07-16 NOTE — Progress Notes (Signed)
 Specialty Pharmacy Refill Coordination Note  Chad Lamb is a 78 y.o. male contacted today regarding refills of specialty medication(s) Venetoclax  (VENCLEXTA )   Patient requested Delivery   Delivery date: 07/22/23   Verified address: [address] [place] [address]   Medication will be filled on 05.05.25.   This fill date is pending response to refill request from provider. Patient is aware and if they have not received fill by intended date they must follow up with pharmacy.

## 2023-07-16 NOTE — Progress Notes (Signed)
 Patient is taking 14 days with a 28 day rest period. Adjusted refill recurrence and queued medication to ship 5/5.

## 2023-07-16 NOTE — Addendum Note (Signed)
 Addended by: Thaddeus Filippo on: 07/16/2023 10:41 AM   Modules accepted: Orders

## 2023-07-16 NOTE — Telephone Encounter (Signed)
 Verified with MD patient is taking venetoclax  14 days on/28 days off, rx instruction updated and resent to pharmacy.

## 2023-07-21 ENCOUNTER — Other Ambulatory Visit: Payer: Self-pay

## 2023-08-04 ENCOUNTER — Inpatient Hospital Stay (HOSPITAL_BASED_OUTPATIENT_CLINIC_OR_DEPARTMENT_OTHER): Admitting: Hematology

## 2023-08-04 ENCOUNTER — Inpatient Hospital Stay

## 2023-08-04 ENCOUNTER — Inpatient Hospital Stay: Attending: Hematology

## 2023-08-04 VITALS — BP 133/72 | HR 58 | Resp 16

## 2023-08-04 DIAGNOSIS — D469 Myelodysplastic syndrome, unspecified: Secondary | ICD-10-CM

## 2023-08-04 DIAGNOSIS — D4622 Refractory anemia with excess of blasts 2: Secondary | ICD-10-CM | POA: Diagnosis present

## 2023-08-04 DIAGNOSIS — Z95828 Presence of other vascular implants and grafts: Secondary | ICD-10-CM | POA: Diagnosis not present

## 2023-08-04 DIAGNOSIS — F17290 Nicotine dependence, other tobacco product, uncomplicated: Secondary | ICD-10-CM | POA: Insufficient documentation

## 2023-08-04 DIAGNOSIS — Z5111 Encounter for antineoplastic chemotherapy: Secondary | ICD-10-CM | POA: Insufficient documentation

## 2023-08-04 LAB — COMPREHENSIVE METABOLIC PANEL WITH GFR
ALT: 15 U/L (ref 0–44)
AST: 23 U/L (ref 15–41)
Albumin: 3.7 g/dL (ref 3.5–5.0)
Alkaline Phosphatase: 67 U/L (ref 38–126)
Anion gap: 6 (ref 5–15)
BUN: 19 mg/dL (ref 8–23)
CO2: 24 mmol/L (ref 22–32)
Calcium: 9.5 mg/dL (ref 8.9–10.3)
Chloride: 106 mmol/L (ref 98–111)
Creatinine, Ser: 0.91 mg/dL (ref 0.61–1.24)
GFR, Estimated: >60 mL/min (ref >60)
Glucose, Bld: 115 mg/dL — ABNORMAL HIGH (ref 70–99)
Potassium: 3.6 mmol/L (ref 3.5–5.1)
Sodium: 136 mmol/L (ref 135–145)
Total Bilirubin: 1.1 mg/dL (ref 0.0–1.2)
Total Protein: 5.9 g/dL — ABNORMAL LOW (ref 6.5–8.1)

## 2023-08-04 LAB — CBC WITH DIFFERENTIAL/PLATELET
Abs Immature Granulocytes: 0.01 10*3/uL (ref 0.00–0.07)
Basophils Absolute: 0 10*3/uL (ref 0.0–0.1)
Basophils Relative: 1 %
Eosinophils Absolute: 0.7 10*3/uL — ABNORMAL HIGH (ref 0.0–0.5)
Eosinophils Relative: 12 %
HCT: 35.3 % — ABNORMAL LOW (ref 39.0–52.0)
Hemoglobin: 11.5 g/dL — ABNORMAL LOW (ref 13.0–17.0)
Immature Granulocytes: 0 %
Lymphocytes Relative: 19 %
Lymphs Abs: 1 10*3/uL (ref 0.7–4.0)
MCH: 26.8 pg (ref 26.0–34.0)
MCHC: 32.6 g/dL (ref 30.0–36.0)
MCV: 82.3 fL (ref 80.0–100.0)
Monocytes Absolute: 0.7 10*3/uL (ref 0.1–1.0)
Monocytes Relative: 13 %
Neutro Abs: 3 10*3/uL (ref 1.7–7.7)
Neutrophils Relative %: 55 %
Platelets: 195 10*3/uL (ref 150–400)
RBC: 4.29 MIL/uL (ref 4.22–5.81)
RDW: 18.3 % — ABNORMAL HIGH (ref 11.5–15.5)
WBC: 5.5 10*3/uL (ref 4.0–10.5)
nRBC: 0 % (ref 0.0–0.2)

## 2023-08-04 LAB — MAGNESIUM: Magnesium: 2 mg/dL (ref 1.7–2.4)

## 2023-08-04 MED ORDER — HEPARIN SOD (PORK) LOCK FLUSH 100 UNIT/ML IV SOLN
500.0000 [IU] | Freq: Once | INTRAVENOUS | Status: AC | PRN
Start: 2023-08-04 — End: 2023-08-04
  Administered 2023-08-04: 500 [IU]

## 2023-08-04 MED ORDER — DEXAMETHASONE SODIUM PHOSPHATE 10 MG/ML IJ SOLN
10.0000 mg | Freq: Once | INTRAMUSCULAR | Status: AC
  Administered 2023-08-04: 10 mg via INTRAVENOUS
  Filled 2023-08-04: qty 1

## 2023-08-04 MED ORDER — SODIUM CHLORIDE 0.9 % IV SOLN
75.0000 mg/m2 | Freq: Once | INTRAVENOUS | Status: AC
  Administered 2023-08-04: 140 mg via INTRAVENOUS
  Filled 2023-08-04: qty 14

## 2023-08-04 MED ORDER — PALONOSETRON HCL INJECTION 0.25 MG/5ML
0.2500 mg | Freq: Once | INTRAVENOUS | Status: AC
  Administered 2023-08-04: 0.25 mg via INTRAVENOUS
  Filled 2023-08-04: qty 5

## 2023-08-04 MED ORDER — SODIUM CHLORIDE 0.9% FLUSH
10.0000 mL | INTRAVENOUS | Status: DC | PRN
  Administered 2023-08-04: 10 mL

## 2023-08-04 MED ORDER — SODIUM CHLORIDE 0.9% FLUSH
10.0000 mL | INTRAVENOUS | Status: DC | PRN
  Administered 2023-08-04: 10 mL via INTRAVENOUS

## 2023-08-04 MED ORDER — SODIUM CHLORIDE 0.9 % IV SOLN: Freq: Once | INTRAVENOUS | Status: AC

## 2023-08-04 NOTE — Progress Notes (Signed)
 [facility] [address] [place] [address]    Clinic Day:  08/04/2023  Referring physician: Dorena Gander, Lamb  Patient Care Team: Chad Gander, Lamb as PCP - General (Family Medicine) Chad Gander, Lamb (Family Medicine) Chad Jubilee, Lamb (Inactive) as Consulting Physician (Gastroenterology) Chad Boros, Lamb as Medical Oncologist (Hematology)   ASSESSMENT & PLAN:   Assessment: 1. MDS with EB 2: - Seen at the request of Dr. Barbar Lamb for cytopenias - BMBX on 06/04/2021: Hypercellular marrow with dyspoietic changes involving the granulocytic cell line and megakaryocytes.  Myeloblasts 8% on aspirate smears and 10% by flow.  Cytogenetics 43, XY (20).  MDS FISH panel normal. 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 - IPSS-M score of 0.12, moderate high risk.  Leukemia free survival is 2.3 years.  Overall survival 2.8 years. - Cycle 1 of azacitidine  on 07/30/2021.  BMBX (01/04/2022): Overall improvement in blast count and cellularity.  Chromosome analysis and FISH panel normal.  Venetoclax  200 mg day 1 through 14 added with cycle 8 on 02/18/2022. - BMBX (06/05/2022): Slightly hypercellular for age with very mild dyspoietic changes at best associated with increased number of eosinophilic cells.  No increase in blast cells identified. - Azacitidine  5 days and venetoclax  200 mg days 1-14 every 6 weeks on 07/22/2022   2. Social/family history: - He lives at home with his wife.  He swims about 40 minutes daily. 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  He also worked as a Geologist, engineering.  Non-smoker. - Paternal aunt had breast cancer and mother had pancreatic cancer.  3.  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 - He underwent thrombolytic therapy at [facility]. - He will followed up with vascular at [facility] and has been on Xarelto  since then.    Plan: 1. MDS with EB 2: - He completed 20 cycles  of azacitidine  and venetoclax  on 06/23/2023. - In the past 6 weeks, he did not have any infections.  Energy levels are stable. - Labs today: White count and platelet count is normal.  Hemoglobin stable at 11.5.  LFTs and creatinine are normal. - He may proceed with cycle 21 today with azacitidine  (75 mg/m) x 5 days and venetoclax  200 mg daily, days 1-14. - RTC 6 weeks for follow-up.   2. Unprovoked left subclavian DVT: - Xarelto  was discontinued due to hemorrhoidal bleeding.  No evidence of recurrence.   3. Constipation: - Constipation is slightly worse the week after treatment.  Continue Colace daily and MiraLAX as needed.    Orders Placed This Encounter  Procedures   Magnesium    Standing Status:   Future    Expected Date:   08/04/2023    Expiration Date:   08/03/2024   Comprehensive metabolic panel    Standing Status:   Future    Expected Date:   08/04/2023    Expiration Date:   08/03/2024   CBC with Differential    Standing Status:   Future    Expected Date:   08/04/2023    Expiration Date:   08/03/2024   Magnesium    Standing Status:   Future    Expected Date:   09/15/2023    Expiration Date:   09/14/2024   Comprehensive metabolic panel    Standing Status:   Future    Expected Date:   09/15/2023    Expiration Date:   09/14/2024  CBC with Differential    Standing Status:   Future    Expected Date:   09/15/2023    Expiration Date:   09/14/2024   Magnesium    Standing Status:   Future    Expected Date:   10/27/2023    Expiration Date:   10/26/2024   Comprehensive metabolic panel    Standing Status:   Future    Expected Date:   10/27/2023    Expiration Date:   10/26/2024   CBC with Differential    Standing Status:   Future    Expected Date:   10/27/2023    Expiration Date:   10/26/2024   Magnesium    Standing Status:   Future    Expected Date:   12/08/2023    Expiration Date:   12/07/2024   Comprehensive metabolic panel    Standing Status:   Future    Expected Date:   12/08/2023     Expiration Date:   12/07/2024   CBC with Differential    Standing Status:   Future    Expected Date:   12/08/2023    Expiration Date:   12/07/2024      Chad Lamb,acting as a scribe for Chad Boros, Lamb.,have documented all relevant documentation on the behalf of Chad Boros, Lamb,as directed by  Chad Boros, Lamb while in the presence of Chad Boros, Lamb.  I, Chad Lamb, have reviewed the above documentation for accuracy and completeness, and I agree with the above.    Chad Boros, Lamb   5/19/202512:29 PM  CHIEF COMPLAINT:   Diagnosis: MDS with EB 2    Cancer Staging  No matching staging information was found for the patient.    Prior Therapy: none  Current Therapy:  Azacitidine  and venetoclax     HISTORY OF PRESENT ILLNESS:   Oncology History  Myelodysplastic syndrome (HCC)  06/17/2021 Initial Diagnosis   Myelodysplastic syndrome (HCC)   07/30/2021 - 10/30/2021 Chemotherapy   Patient is on Treatment Plan : MYELODYSPLASIA  Azacitidine  IV D1-7 q28d     07/30/2021 -  Chemotherapy   Patient is on Treatment Plan : MYELODYSPLASIA  Azacitidine  IV D1-5 q42d        INTERVAL HISTORY:   Chad Lamb is a 78 y.o. male presenting to clinic today for follow up of MDS with EB 2. He was last seen by me on 05/14/23 and Dr. Orvis Lamb on 06/23/23.  Today, he states that he is doing well overall. His appetite level is at 100%. His energy level is at 70%.  PAST MEDICAL HISTORY:   Past Medical History: Past Medical History:  Diagnosis Date   Allergy    Anxiety    Arthritis    Asthma    Cataract    Clotting disorder (HCC)    DVT (deep venous thrombosis) (HCC)    unknown etiology. [facility]   GERD (gastroesophageal reflux disease)    History of dental surgery    feb 2023   Hyperlipidemia    Hypertension    MDS (myelodysplastic syndrome), high grade (HCC) 06/17/2021   Pneumonia    Port-A-Cath in place 07/30/2021    Surgical  History: Past Surgical History:  Procedure Laterality Date   CATARACT EXTRACTION W/PHACO Right 11/24/2017   Procedure: CATARACT EXTRACTION PHACO AND INTRAOCULAR LENS PLACEMENT RIGHT EYE;  Surgeon: Chad Kill, Lamb;  Location: [facility];  Service: Ophthalmology;  Laterality: Right;  CDE: 8.61   CATARACT EXTRACTION W/PHACO Left 12/29/2017   Procedure: CATARACT EXTRACTION PHACO AND  INTRAOCULAR LENS PLACEMENT (IOC);  Surgeon: Chad Kill, Lamb;  Location: [facility];  Service: Ophthalmology;  Laterality: Left;  CDE: 7.21   GANGLION CYST EXCISION     HERNIA REPAIR     bilateral   IR BONE MARROW BIOPSY & ASPIRATION  06/05/2022   PARS PLANA VITRECTOMY Left 05/17/2021   Procedure: PARS PLANA VITRECTOMY 25 GAUGE WITH ANTIBIOTIC INJECTION  AND ENDOLASER LEFT EYE;  Surgeon: Jearline Minder, Lamb;  Location: [facility] OR;  Service: Ophthalmology;  Laterality: Left;   PORTACATH PLACEMENT Right 07/09/2021   Procedure: INSERTION PORT-A-CATH;  Surgeon: Alanda Allegra, Lamb;  Location: [facility];  Service: General;  Laterality: Right;   TONSILLECTOMY     VASCULAR SURGERY      Social History: Social History   Socioeconomic History   Marital status: Married    Spouse name: Haskell Linker   Number of children: 0   Years of education: 13   Highest education level: High school graduate  Occupational History   Not on file  Tobacco Use   Smoking status: Former    Types: Pipe    Quit date: 03/21/1966    Years since quitting: 57.4   Smokeless tobacco: Never  Vaping Use   Vaping status: Never Used  Substance and Sexual Activity   Alcohol use: Yes    Alcohol/week: 1.0 standard drink of alcohol    Types: 1 Cans of beer per week    Comment: 1 can of beer a week   Drug use: Never   Sexual activity: Not Currently  Other Topics Concern   Not on file  Social History Narrative   ** Merged History Encounter **       Grew up in [place], [place]  and [place] .  Retired.  Worked in [place]. 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    Social Drivers of Corporate investment banker Strain: Low Risk  (01/23/2017)   Overall Financial Resource Strain (CARDIA)    Difficulty of Paying Living Expenses: Not hard at all  Food Insecurity: No Food Insecurity (01/23/2017)   Hunger Vital Sign    Worried About Running Out of Food in the Last Year: Never true    Ran Out of Food in the Last Year: Never true  Transportation Needs: No Transportation Needs (01/23/2017)   PRAPARE - Administrator, Civil Service (Medical): No    Lack of Transportation (Non-Medical): No  Physical Activity: Unknown (01/23/2017)   Exercise Vital Sign    Days of Exercise per Week: 3 days    Minutes of Exercise per Session: Not on file  Stress: Stress Concern Present (01/23/2017)   [employer] of Occupational Health - Occupational Stress Questionnaire    Feeling of Stress : To some extent  Social Connections: Somewhat Isolated (01/23/2017)   Social Connection and Isolation Panel [NHANES]    Frequency of Communication with Friends and Family: More than three times a week    Frequency of Social Gatherings with Friends and Family: More than three times a week    Attends Religious Services: Never    Database administrator or Organizations: No    Attends Banker Meetings: Never    Marital Status: Married  Catering manager Violence: Not At Risk (01/23/2017)   Humiliation, Afraid, Rape, and Kick questionnaire    Fear of Current or Ex-Partner: No    Emotionally Abused: No    Physically Abused: No    Sexually Abused: No  Family History: Family History  Problem Relation Age of Onset   Cancer Mother        pancreatic   Arthritis Father    Early death Father 69       Lung infiltrate   Colon cancer Neg Hx    Colon polyps Neg Hx     Current Medications:  Current Outpatient Medications:    albuterol  (PROVENTIL  HFA;VENTOLIN  HFA) 108 (90 Base) MCG/ACT inhaler, Inhale 1-2 puffs into the lungs  every 6 (six) hours as needed for wheezing or shortness of breath., Disp: , Rfl:    ALPRAZolam (XANAX) 0.5 MG tablet, Take 0.5 mg by mouth 2 (two) times daily as needed for anxiety., Disp: , Rfl:    amLODipine (NORVASC) 2.5 MG tablet, Take 2.5 mg by mouth daily., Disp: , Rfl:    azaCITIDine  5 mg/2 mLs in lactated ringers  infusion, Inject into the vein daily. Days 1-7 every 28 days, Disp: , Rfl:    DENTA 5000 PLUS 1.1 % CREA dental cream, Take by mouth., Disp: , Rfl:    docusate sodium (COLACE) 100 MG capsule, 1 capsule Orally daily (3 daily when on chemo), Disp: , Rfl:    fluticasone  furoate-vilanterol (BREO ELLIPTA) 200-25 MCG/ACT AEPB, Inhale 1 puff into the lungs daily., Disp: , Rfl:    Lactulose  20 GM/30ML SOLN, Take 30 mLs (20 g total) by mouth at bedtime., Disp: 473 mL, Rfl: 3   Lidocaine -Hydrocort , Perianal, 3-0.5 % CREA, Apply topically 2 (two) times daily as needed., Disp: , Rfl:    Lidocaine -Hydrocortisone  Ace 3-0.5 % CREA, Apply 1 application topically as directed. (Patient taking differently: Apply 1 application  topically 2 (two) times daily as needed (hemorrhoids).), Disp: 30 Tube, Rfl: 11   lidocaine -prilocaine  (EMLA ) cream, Apply a SMALL AMOUNT TO port a cath site (DO not RUB in) AND cover with PLASTIC WRAP 1 hour prior TO infusion appointment, Disp: 30 g, Rfl: 3   loratadine (CLARITIN) 10 MG tablet, Take 10 mg by mouth daily., Disp: , Rfl:    Lutein 40 MG CAPS, Take 40 mg by mouth daily., Disp: , Rfl:    Multiple Vitamins-Minerals (CENTRUM ADULTS PO), Take 1 tablet by mouth daily., Disp: , Rfl:    nystatin cream (MYCOSTATIN), Apply 1 application. topically 2 (two) times daily as needed for dry skin., Disp: , Rfl:    ofloxacin (OCUFLOX) 0.3 % ophthalmic solution, Place 1 drop into the left eye See admin instructions. 1 drop to left eye 4 x daily for 7 days after monthly procedure., Disp: , Rfl:    omeprazole (PRILOSEC OTC) 20 MG tablet, Take 5-10 mg by mouth daily., Disp: , Rfl:     sildenafil (VIAGRA) 50 MG tablet, Take 50 mg by mouth daily as needed for erectile dysfunction., Disp: , Rfl:    traMADol  (ULTRAM ) 50 MG tablet, Take 1 tablet (50 mg total) by mouth every 6 (six) hours as needed., Disp: 15 tablet, Rfl: 0   venetoclax  (VENCLEXTA ) 100 MG tablet, Take 2 tablets (200 mg total) by mouth daily. Take for 14 days, then hold for 28 days. Repeat every 42 days. Take with a meal and a full glass of water ., Disp: 28 tablet, Rfl: 1 No current facility-administered medications for this visit.  Facility-Administered Medications Ordered in Other Visits:    0.9 %  sodium chloride  infusion, , Intravenous, Once, Chad Boros, Lamb   azaCITIDine  (VIDAZA ) 140 mg in sodium chloride  0.9 % 50 mL chemo infusion, 75 mg/m2 (Treatment Plan Recorded), Intravenous, Once, Leigh Blas,  Tereasa Felty, Lamb   dexamethasone  (DECADRON ) injection 10 mg, 10 mg, Intravenous, Once, Chad Boros, Lamb   heparin  lock flush 100 unit/mL, 500 Units, Intracatheter, Once PRN, Amily Depp, Lamb   palonosetron  (ALOXI ) injection 0.25 mg, 0.25 mg, Intravenous, Once, Chad Boros, Lamb   sodium chloride  flush (NS) 0.9 % injection 10 mL, 10 mL, Intracatheter, PRN, Paton Crum, Lamb, 10 mL at 06/12/22 1606   sodium chloride  flush (NS) 0.9 % injection 10 mL, 10 mL, Intracatheter, PRN, Shandria Clinch, Lamb   Allergies: Allergies  Allergen Reactions   Cephalosporins Itching   Amoxapine And Related Itching and Rash    Has patient had a PCN reaction causing immediate rash, facial/tongue/throat swelling, SOB or lightheadedness with hypotension: No Has patient had a PCN reaction causing severe rash involving mucus membranes or skin necrosis: No Has patient had a PCN reaction that required hospitalization: No Has patient had a PCN reaction occurring within the last 10 years: No If all of the above answers are "NO", then may proceed with Cephalosporin use.     Amoxicillin Rash   Cephalexin  Rash   Clindamycin/Lincomycin Itching and Rash   Sulfamethoxazole Itching and Rash   Trimethoprim Rash    REVIEW OF SYSTEMS:   Review of Systems  Constitutional:  Negative for chills, fatigue and fever.  HENT:   Negative for lump/mass, mouth sores, nosebleeds, sore throat and trouble swallowing.   Eyes:  Negative for eye problems.  Respiratory:  Negative for cough and shortness of breath.   Cardiovascular:  Negative for chest pain, leg swelling and palpitations.  Gastrointestinal:  Positive for constipation. Negative for abdominal pain, diarrhea, nausea and vomiting.  Genitourinary:  Negative for bladder incontinence, difficulty urinating, dysuria, frequency, hematuria and nocturia.   Musculoskeletal:  Negative for arthralgias, back pain, flank pain, myalgias and neck pain.  Skin:  Negative for itching and rash.  Neurological:  Positive for dizziness. Negative for headaches and numbness.  Hematological:  Does not bruise/bleed easily.  Psychiatric/Behavioral:  Negative for depression, sleep disturbance and suicidal ideas. The patient is nervous/anxious.   All other systems reviewed and are negative.    VITALS:   There were no vitals taken for this visit.  Wt Readings from Last 3 Encounters:  08/04/23 154 lb (69.9 kg)  06/23/23 150 lb 5.7 oz (68.2 kg)  05/13/23 152 lb 6.4 oz (69.1 kg)    There is no height or weight on file to calculate BMI.  Performance status (ECOG): 1 - Symptomatic but completely ambulatory  PHYSICAL EXAM:   Physical Exam Vitals and nursing note reviewed. Exam conducted with a chaperone present.  Constitutional:      Appearance: Normal appearance.  Cardiovascular:     Rate and Rhythm: Normal rate and regular rhythm.     Pulses: Normal pulses.     Heart sounds: Normal heart sounds.  Pulmonary:     Effort: Pulmonary effort is normal.     Breath sounds: Normal breath sounds.  Abdominal:     Palpations: Abdomen is soft. There is no hepatomegaly,  splenomegaly or mass.     Tenderness: There is no abdominal tenderness.  Musculoskeletal:     Right lower leg: No edema.     Left lower leg: No edema.  Lymphadenopathy:     Cervical: No cervical adenopathy.     Right cervical: No superficial, deep or posterior cervical adenopathy.    Left cervical: No superficial, deep or posterior cervical adenopathy.     Upper Body:  Right upper body: No supraclavicular or axillary adenopathy.     Left upper body: No supraclavicular or axillary adenopathy.  Neurological:     General: No focal deficit present.     Mental Status: He is alert and oriented to person, place, and time.  Psychiatric:        Mood and Affect: Mood normal.        Behavior: Behavior normal.     LABS:   CBC     Component Value Date/Time   WBC 5.5 08/04/2023 1104   RBC 4.29 08/04/2023 1104   HGB 11.5 (L) 08/04/2023 1104   HCT 35.3 (L) 08/04/2023 1104   PLT 195 08/04/2023 1104   MCV 82.3 08/04/2023 1104   MCH 26.8 08/04/2023 1104   MCHC 32.6 08/04/2023 1104   RDW 18.3 (H) 08/04/2023 1104   LYMPHSABS 1.0 08/04/2023 1104   MONOABS 0.7 08/04/2023 1104   EOSABS 0.7 (H) 08/04/2023 1104   BASOSABS 0.0 08/04/2023 1104    CMP      Component Value Date/Time   NA 136 08/04/2023 1104   K 3.6 08/04/2023 1104   CL 106 08/04/2023 1104   CO2 24 08/04/2023 1104   GLUCOSE 115 (H) 08/04/2023 1104   BUN 19 08/04/2023 1104   CREATININE 0.91 08/04/2023 1104   CREATININE 0.98 02/12/2017 1421   CALCIUM 9.5 08/04/2023 1104   PROT 5.9 (L) 08/04/2023 1104   ALBUMIN 3.7 08/04/2023 1104   AST 23 08/04/2023 1104   ALT 15 08/04/2023 1104   ALKPHOS 67 08/04/2023 1104   BILITOT 1.1 08/04/2023 1104   GFRNONAA >60 08/04/2023 1104   GFRNONAA 77 02/12/2017 1421   GFRAA >60 11/19/2017 1532   GFRAA 89 02/12/2017 1421     No results found for: "CEA1", "CEA" / No results found for: "CEA1", "CEA" No results found for: "PSA1" No results found for: "ZOX096" No results found for:  "CAN125"  Lab Results  Component Value Date   TOTALPROTELP 6.3 01/05/2021   ALBUMINELP 3.3 01/05/2021   A1GS 0.3 01/05/2021   A2GS 0.8 01/05/2021   BETS 1.0 01/05/2021   GAMS 1.0 01/05/2021   MSPIKE Not Observed 01/05/2021   SPEI Comment 01/05/2021   No results found for: "TIBC", "FERRITIN", "IRONPCTSAT" Lab Results  Component Value Date   LDH 147 11/20/2021   LDH 143 10/22/2021   LDH 144 10/16/2021     STUDIES:   No results found.

## 2023-08-04 NOTE — Patient Instructions (Signed)
 [facility] [place] [facility]  Discharge Instructions: Thank you for choosing [facility] to provide your oncology and hematology care.  If you have a lab appointment with the Cancer Center - please note that after April 8th, 2024, all labs will be drawn in the cancer center.  You do not have to check in or register with the main entrance as you have in the past but will complete your check-in in the cancer center.  Wear comfortable clothing and clothing appropriate for easy access to any Portacath or PICC line.   We strive to give you quality time with your provider. You may need to reschedule your appointment if you arrive late (15 or more minutes).  Arriving late affects you and other patients whose appointments are after yours.  Also, if you miss three or more appointments without notifying the office, you may be dismissed from the clinic at the provider's discretion.      For prescription refill requests, have your pharmacy contact our office and allow 72 hours for refills to be completed.    Today you received the following chemotherapy and/or immunotherapy agents vidaza.       To help prevent nausea and vomiting after your treatment, we encourage you to take your nausea medication as directed.  BELOW ARE SYMPTOMS THAT SHOULD BE REPORTED IMMEDIATELY: *FEVER GREATER THAN 100.4 F (38 C) OR HIGHER *CHILLS OR SWEATING *NAUSEA AND VOMITING THAT IS NOT CONTROLLED WITH YOUR NAUSEA MEDICATION *UNUSUAL SHORTNESS OF BREATH *UNUSUAL BRUISING OR BLEEDING *URINARY PROBLEMS (pain or burning when urinating, or frequent urination) *BOWEL PROBLEMS (unusual diarrhea, constipation, pain near the anus) TENDERNESS IN MOUTH AND THROAT WITH OR WITHOUT PRESENCE OF ULCERS (sore throat, sores in mouth, or a toothache) UNUSUAL RASH, SWELLING OR PAIN  UNUSUAL VAGINAL DISCHARGE OR ITCHING   Items with * indicate a potential emergency and should be followed up  as soon as possible or go to the Emergency Department if any problems should occur.  Please show the CHEMOTHERAPY ALERT CARD or IMMUNOTHERAPY ALERT CARD at check-in to the Emergency Department and triage nurse.  Should you have questions after your visit or need to cancel or reschedule your appointment, please contact [facility] [place] - A DEPT OF [facility]ideg[facility] [phone]  and follow the prompts.  Office hours are 8:00 a.m. to 4:30 p.m. Monday - Friday. Please note that voicemails left after 4:00 p.m. may not be returned until the following business day.  We are closed weekends and major holidays. You have access to a nurse at all times for urgent questions. Please call the main number to the clinic [phone] and follow the prompts.  For any non-urgent questions, you may also contact your provider using MyChart. We now offer e-Visits for anyone 8 and older to request care online for non-urgent symptoms. For details visit [email or web address].   Also download the MyChart app! Go to the app store, search "MyChart", open the app, select [place], and log in with your MyChart username and password.

## 2023-08-04 NOTE — Progress Notes (Signed)

## 2023-08-04 NOTE — Patient Instructions (Signed)

## 2023-08-05 ENCOUNTER — Inpatient Hospital Stay

## 2023-08-05 ENCOUNTER — Other Ambulatory Visit: Payer: Self-pay

## 2023-08-05 VITALS — BP 117/74 | HR 53 | Temp 97.6°F | Resp 18

## 2023-08-05 DIAGNOSIS — Z5111 Encounter for antineoplastic chemotherapy: Secondary | ICD-10-CM | POA: Diagnosis not present

## 2023-08-05 DIAGNOSIS — D469 Myelodysplastic syndrome, unspecified: Secondary | ICD-10-CM

## 2023-08-05 DIAGNOSIS — Z95828 Presence of other vascular implants and grafts: Secondary | ICD-10-CM

## 2023-08-05 MED ORDER — SODIUM CHLORIDE 0.9% FLUSH
10.0000 mL | INTRAVENOUS | Status: DC | PRN
  Administered 2023-08-05: 10 mL

## 2023-08-05 MED ORDER — HEPARIN SOD (PORK) LOCK FLUSH 100 UNIT/ML IV SOLN
500.0000 [IU] | Freq: Once | INTRAVENOUS | Status: AC | PRN
  Administered 2023-08-05: 500 [IU]

## 2023-08-05 MED ORDER — DEXAMETHASONE SODIUM PHOSPHATE 10 MG/ML IJ SOLN
10.0000 mg | Freq: Once | INTRAMUSCULAR | Status: AC
  Administered 2023-08-05: 10 mg via INTRAVENOUS
  Filled 2023-08-05: qty 1

## 2023-08-05 MED ORDER — SODIUM CHLORIDE 0.9 % IV SOLN
75.0000 mg/m2 | Freq: Once | INTRAVENOUS | Status: AC
  Administered 2023-08-05: 140 mg via INTRAVENOUS
  Filled 2023-08-05: qty 14

## 2023-08-05 MED ORDER — SODIUM CHLORIDE 0.9 % IV SOLN: Freq: Once | INTRAVENOUS | Status: AC

## 2023-08-05 NOTE — Progress Notes (Signed)
 Patient presents today for D2 Vidaza  per provider's order. Vital signs stable and patient voiced no new complaints at this time.  Treatment given today per MD orders. Tolerated infusion without adverse affects. Vital signs stable. No complaints at this time. Discharged from clinic ambulatory in stable condition. Alert and oriented x 3. F/U with [facility] as scheduled.

## 2023-08-05 NOTE — Patient Instructions (Signed)

## 2023-08-06 ENCOUNTER — Inpatient Hospital Stay

## 2023-08-06 VITALS — BP 132/81 | HR 55 | Temp 97.3°F | Resp 18

## 2023-08-06 DIAGNOSIS — D469 Myelodysplastic syndrome, unspecified: Secondary | ICD-10-CM

## 2023-08-06 DIAGNOSIS — Z95828 Presence of other vascular implants and grafts: Secondary | ICD-10-CM

## 2023-08-06 DIAGNOSIS — Z5111 Encounter for antineoplastic chemotherapy: Secondary | ICD-10-CM | POA: Diagnosis not present

## 2023-08-06 MED ORDER — PALONOSETRON HCL INJECTION 0.25 MG/5ML
0.2500 mg | Freq: Once | INTRAVENOUS | Status: AC
  Administered 2023-08-06: 0.25 mg via INTRAVENOUS
  Filled 2023-08-06: qty 5

## 2023-08-06 MED ORDER — HEPARIN SOD (PORK) LOCK FLUSH 100 UNIT/ML IV SOLN
500.0000 [IU] | Freq: Once | INTRAVENOUS | Status: AC | PRN
Start: 2023-08-06 — End: 2023-08-06
  Administered 2023-08-06: 500 [IU]

## 2023-08-06 MED ORDER — DEXAMETHASONE SODIUM PHOSPHATE 10 MG/ML IJ SOLN
10.0000 mg | Freq: Once | INTRAMUSCULAR | Status: AC
  Administered 2023-08-06: 10 mg via INTRAVENOUS
  Filled 2023-08-06: qty 1

## 2023-08-06 MED ORDER — SODIUM CHLORIDE 0.9% FLUSH
10.0000 mL | INTRAVENOUS | Status: DC | PRN
  Administered 2023-08-06: 10 mL

## 2023-08-06 MED ORDER — SODIUM CHLORIDE 0.9 % IV SOLN
75.0000 mg/m2 | Freq: Once | INTRAVENOUS | Status: AC
  Administered 2023-08-06: 140 mg via INTRAVENOUS
  Filled 2023-08-06: qty 14

## 2023-08-06 MED ORDER — SODIUM CHLORIDE 0.9 % IV SOLN: Freq: Once | INTRAVENOUS | Status: AC

## 2023-08-06 NOTE — Patient Instructions (Signed)
 [facility] [place] - A DEPT OF [facility]  Discharge Instructions: Thank you for choosing [facility] to provide your oncology and hematology care.  If you have a lab appointment with the Cancer Center - please note that after April 8th, 2024, all labs will be drawn in the cancer center.  You do not have to check in or register with the main entrance as you have in the past but will complete your check-in in the cancer center.  Wear comfortable clothing and clothing appropriate for easy access to any Portacath or PICC line.   We strive to give you quality time with your provider. You may need to reschedule your appointment if you arrive late (15 or more minutes).  Arriving late affects you and other patients whose appointments are after yours.  Also, if you miss three or more appointments without notifying the office, you may be dismissed from the clinic at the provider's discretion.      For prescription refill requests, have your pharmacy contact our office and allow 72 hours for refills to be completed.    Today you received the following chemotherapy and/or immunotherapy agents D3 Vidaza     To help prevent nausea and vomiting after your treatment, we encourage you to take your nausea medication as directed.  Azacitidine  Injection What is this medication? AZACITIDINE  (ay [employer] i deen) treats blood and bone marrow cancers. It works by slowing down the growth of cancer cells. This medicine may be used for other purposes; ask your health care provider or pharmacist if you have questions. COMMON BRAND NAME(S): Vidaza  What should I tell my care team before I take this medication? They need to know if you have any of these conditions: Kidney disease Liver disease Low blood cell levels, such as low white cells, platelets, or red blood cells Low levels of albumin in the blood Low levels of bicarbonate in the blood An unusual or allergic reaction to  azacitidine , mannitol, other medications, foods, dyes, or preservatives If you or your partner are pregnant or trying to get pregnant Breast-feeding How should I use this medication? This medication is injected into a vein or under the skin. It is given by your care team in a hospital or clinic setting. Talk to your care team about the use of this medication in children. While it may be prescribed for children as young as 1 month for selected conditions, precautions do apply. Overdosage: If you think you have taken too much of this medicine contact a poison control center or emergency room at once. NOTE: This medicine is only for you. Do not share this medicine with others. What if I miss a dose? Keep appointments for follow-up doses. It is important not to miss your dose. Call your care team if you are unable to keep an appointment. What may interact with this medication? Interactions are not expected. This list may not describe all possible interactions. Give your health care provider a list of all the medicines, herbs, non-prescription drugs, or dietary supplements you use. Also tell them if you smoke, drink alcohol, or use illegal drugs. Some items may interact with your medicine. What should I watch for while using this medication? Your condition will be monitored carefully while you are receiving this medication. This medication may make you feel generally unwell. This is not uncommon as chemotherapy can affect healthy cells as well as cancer cells. Report any side effects. Continue your course of  treatment even though you feel ill unless your care team tells you to stop. You may need blood work done while you are taking this medication. Other product types may be available that contain the medication azacitidine . The injection and oral products should not be used in place of one another. Talk to your care team if you have questions. This medication can cause serious side effects. To reduce  the risk, your care team may give you other medications to take before receiving this one. Be sure to follow the directions from your care team. This medication may increase your risk of getting an infection. Call your care team for advice if you get a fever, chills, sore throat, or other symptoms of a cold or flu. Do not treat yourself. Try to avoid being around people who are sick. Avoid taking medications that contain aspirin, acetaminophen , ibuprofen, naproxen, or ketoprofen unless instructed by your care team. These medications may hide a fever. Be careful brushing or flossing your teeth or using a toothpick because you may get an infection or bleed more easily. If you have any dental work done, tell your dentist you are receiving this medication. Talk to your care team if you or your partner may be pregnant. Serious birth defects can occur if you take this medication during pregnancy and for 6 months after the last dose. You will need a negative pregnancy test before starting this medication. Contraception is recommended while taking this medication and for 6 months after the last dose. Your care team can help you find the option that works for you. If your partner can get pregnant, use a condom during sex while taking this medication and for 3 months after the last dose. Do not breastfeed while taking this medication and for 1 week after the last dose. This medication may cause infertility. Talk to your care team if you are concerned about your fertility. What side effects may I notice from receiving this medication? Side effects that you should report to your care team as soon as possible: Allergic reactions--skin rash, itching, hives, swelling of the face, lips, tongue, or throat Infection--fever, chills, cough, sore throat, wounds that don't heal, pain or trouble when passing urine, general feeling of discomfort or being unwell Kidney injury--decrease in the amount of urine, swelling of the  ankles, hands, or feet Liver injury--right upper belly pain, loss of appetite, nausea, light-colored stool, dark yellow or brown urine, yellowing skin or eyes, unusual weakness or fatigue Low red blood cell level--unusual weakness or fatigue, dizziness, headache, trouble breathing Tumor lysis syndrome (TLS)--nausea, vomiting, diarrhea, decrease in the amount of urine, dark urine, unusual weakness or fatigue, confusion, muscle pain or cramps, fast or irregular heartbeat, joint pain Unusual bruising or bleeding Side effects that usually do not require medical attention (report to your care team if they continue or are bothersome): Constipation Diarrhea Nausea Pain, redness, or irritation at injection site Vomiting This list may not describe all possible side effects. Call your doctor for medical advice about side effects. You may report side effects to FDA at 1-800-FDA-1088. Where should I keep my medication? This medication is given in a hospital or clinic. It will not be stored at home. NOTE: This sheet is a summary. It may not cover all possible information. If you have questions about this medicine, talk to your doctor, pharmacist, or health care provider.  2024 Elsevier/Gold Standard (2022-11-04 00:00:00)  BELOW ARE SYMPTOMS THAT SHOULD BE REPORTED IMMEDIATELY: *FEVER GREATER THAN 100.4 F (38  C) OR HIGHER *CHILLS OR SWEATING *NAUSEA AND VOMITING THAT IS NOT CONTROLLED WITH YOUR NAUSEA MEDICATION *UNUSUAL SHORTNESS OF BREATH *UNUSUAL BRUISING OR BLEEDING *URINARY PROBLEMS (pain or burning when urinating, or frequent urination) *BOWEL PROBLEMS (unusual diarrhea, constipation, pain near the anus) TENDERNESS IN MOUTH AND THROAT WITH OR WITHOUT PRESENCE OF ULCERS (sore throat, sores in mouth, or a toothache) UNUSUAL RASH, SWELLING OR PAIN  UNUSUAL VAGINAL DISCHARGE OR ITCHING   Items with * indicate a potential emergency and should be followed up as soon as possible or go to the  Emergency Department if any problems should occur.  Please show the CHEMOTHERAPY ALERT CARD or IMMUNOTHERAPY ALERT CARD at check-in to the Emergency Department and triage nurse.  Should you have questions after your visit or need to cancel or reschedule your appointment, please contact [facility] [place] - A DEPT OF [facility] [phone]  and follow the prompts.  Office hours are 8:00 a.m. to 4:30 p.m. Monday - Friday. Please note that voicemails left after 4:00 p.m. may not be returned until the following business day.  We are closed weekends and major holidays. You have access to a nurse at all times for urgent questions. Please call the main number to the clinic [phone] and follow the prompts.  For any non-urgent questions, you may also contact your provider using MyChart. We now offer e-Visits for anyone 68 and older to request care online for non-urgent symptoms. For details visit [email or web address].   Also download the MyChart app! Go to the app store, search "MyChart", open the app, select [email or web address], and log in with your MyChart username and password.

## 2023-08-06 NOTE — Progress Notes (Signed)
 Patient presents today for D3 Vidaza  per provider's order. Vital signs stable and patient voiced no new complaints at this time.  Treatment given today per MD orders. Tolerated infusion without adverse affects. Vital signs stable. No complaints at this time. Discharged from clinic ambulatory in stable condition. Alert and oriented x 3. F/U with [facility] as scheduled.

## 2023-08-07 ENCOUNTER — Inpatient Hospital Stay

## 2023-08-07 VITALS — BP 134/88 | HR 55 | Temp 98.5°F | Resp 18

## 2023-08-07 DIAGNOSIS — Z5111 Encounter for antineoplastic chemotherapy: Secondary | ICD-10-CM | POA: Diagnosis not present

## 2023-08-07 DIAGNOSIS — Z95828 Presence of other vascular implants and grafts: Secondary | ICD-10-CM

## 2023-08-07 DIAGNOSIS — D469 Myelodysplastic syndrome, unspecified: Secondary | ICD-10-CM

## 2023-08-07 MED ORDER — SODIUM CHLORIDE 0.9 % IV SOLN: Freq: Once | INTRAVENOUS | Status: AC

## 2023-08-07 MED ORDER — HEPARIN SOD (PORK) LOCK FLUSH 100 UNIT/ML IV SOLN
500.0000 [IU] | Freq: Once | INTRAVENOUS | Status: AC | PRN
  Administered 2023-08-07: 500 [IU]

## 2023-08-07 MED ORDER — SODIUM CHLORIDE 0.9 % IV SOLN
75.0000 mg/m2 | Freq: Once | INTRAVENOUS | Status: AC
  Administered 2023-08-07: 140 mg via INTRAVENOUS
  Filled 2023-08-07: qty 14

## 2023-08-07 MED ORDER — DEXAMETHASONE SODIUM PHOSPHATE 10 MG/ML IJ SOLN
10.0000 mg | Freq: Once | INTRAMUSCULAR | Status: AC
  Administered 2023-08-07: 10 mg via INTRAVENOUS
  Filled 2023-08-07: qty 1

## 2023-08-07 MED ORDER — SODIUM CHLORIDE 0.9% FLUSH
10.0000 mL | INTRAVENOUS | Status: DC | PRN
  Administered 2023-08-07: 10 mL

## 2023-08-07 NOTE — Progress Notes (Signed)
Patient tolerated chemotherapy with no complaints voiced. Side effects with management reviewed understanding verbalized. Port site clean and dry with no bruising or swelling noted at site. Good blood return noted before and after administration of chemotherapy. Band aid applied. Patient left in satisfactory condition with VSS and no s/s of distress noted. 

## 2023-08-07 NOTE — Patient Instructions (Signed)
 [facility] [place] [facility]  Discharge Instructions: Thank you for choosing [facility] to provide your oncology and hematology care.  If you have a lab appointment with the Cancer Center - please note that after April 8th, 2024, all labs will be drawn in the cancer center.  You do not have to check in or register with the main entrance as you have in the past but will complete your check-in in the cancer center.  Wear comfortable clothing and clothing appropriate for easy access to any Portacath or PICC line.   We strive to give you quality time with your provider. You may need to reschedule your appointment if you arrive late (15 or more minutes).  Arriving late affects you and other patients whose appointments are after yours.  Also, if you miss three or more appointments without notifying the office, you may be dismissed from the clinic at the provider's discretion.      For prescription refill requests, have your pharmacy contact our office and allow 72 hours for refills to be completed.    Today you received the following chemotherapy and/or immunotherapy agents Vidaza, return as scheduled.   To help prevent nausea and vomiting after your treatment, we encourage you to take your nausea medication as directed.  BELOW ARE SYMPTOMS THAT SHOULD BE REPORTED IMMEDIATELY: *FEVER GREATER THAN 100.4 F (38 C) OR HIGHER *CHILLS OR SWEATING *NAUSEA AND VOMITING THAT IS NOT CONTROLLED WITH YOUR NAUSEA MEDICATION *UNUSUAL SHORTNESS OF BREATH *UNUSUAL BRUISING OR BLEEDING *URINARY PROBLEMS (pain or burning when urinating, or frequent urination) *BOWEL PROBLEMS (unusual diarrhea, constipation, pain near the anus) TENDERNESS IN MOUTH AND THROAT WITH OR WITHOUT PRESENCE OF ULCERS (sore throat, sores in mouth, or a toothache) UNUSUAL RASH, SWELLING OR PAIN  UNUSUAL VAGINAL DISCHARGE OR ITCHING   Items with * indicate a potential emergency and  should be followed up as soon as possible or go to the Emergency Department if any problems should occur.  Please show the CHEMOTHERAPY ALERT CARD or IMMUNOTHERAPY ALERT CARD at check-in to the Emergency Department and triage nurse.  Should you have questions after your visit or need to cancel or reschedule your appointment, please contact [facility]  - A DEPT OF Eligha Bridegroom [facility] [phone]  and follow the prompts.  Office hours are 8:00 a.m. to 4:30 p.m. Monday - Friday. Please note that voicemails left after 4:00 p.m. may not be returned until the following business day.  We are closed weekends and major holidays. You have access to a nurse at all times for urgent questions. Please call the main number to the clinic [phone] and follow the prompts.  For any non-urgent questions, you may also contact your provider using MyChart. We now offer e-Visits for anyone 68 and older to request care online for non-urgent symptoms. For details visit [email or web address].   Also download the MyChart app! Go to the app store, search "MyChart", open the app, select [place], and log in with your MyChart username and password.

## 2023-08-08 ENCOUNTER — Inpatient Hospital Stay

## 2023-08-08 VITALS — BP 140/83 | HR 58 | Temp 97.8°F | Resp 19

## 2023-08-08 DIAGNOSIS — Z5111 Encounter for antineoplastic chemotherapy: Secondary | ICD-10-CM | POA: Diagnosis not present

## 2023-08-08 DIAGNOSIS — D469 Myelodysplastic syndrome, unspecified: Secondary | ICD-10-CM

## 2023-08-08 DIAGNOSIS — Z95828 Presence of other vascular implants and grafts: Secondary | ICD-10-CM

## 2023-08-08 MED ORDER — HEPARIN SOD (PORK) LOCK FLUSH 100 UNIT/ML IV SOLN
500.0000 [IU] | Freq: Once | INTRAVENOUS | Status: AC | PRN
Start: 2023-08-08 — End: 2023-08-08
  Administered 2023-08-08: 500 [IU]

## 2023-08-08 MED ORDER — SODIUM CHLORIDE 0.9% FLUSH
10.0000 mL | INTRAVENOUS | Status: DC | PRN
  Administered 2023-08-08: 10 mL

## 2023-08-08 MED ORDER — SODIUM CHLORIDE 0.9 % IV SOLN
75.0000 mg/m2 | Freq: Once | INTRAVENOUS | Status: AC
  Administered 2023-08-08: 140 mg via INTRAVENOUS
  Filled 2023-08-08: qty 14

## 2023-08-08 MED ORDER — PALONOSETRON HCL INJECTION 0.25 MG/5ML
0.2500 mg | Freq: Once | INTRAVENOUS | Status: AC
  Administered 2023-08-08: 0.25 mg via INTRAVENOUS
  Filled 2023-08-08: qty 5

## 2023-08-08 MED ORDER — DEXAMETHASONE SODIUM PHOSPHATE 10 MG/ML IJ SOLN
10.0000 mg | Freq: Once | INTRAMUSCULAR | Status: AC
  Administered 2023-08-08: 10 mg via INTRAVENOUS
  Filled 2023-08-08: qty 1

## 2023-08-08 MED ORDER — SODIUM CHLORIDE 0.9 % IV SOLN: Freq: Once | INTRAVENOUS | Status: AC

## 2023-08-08 NOTE — Progress Notes (Signed)
 Patient presents today for Vidaza  infusion per providers order.  Vital signs within parameters for treatment.  Patient has no new complaints at this time.  Treatment given today per MD orders.  Tolerated infusion without adverse affects.  Vital signs stable.  No complaints at this time.  Discharge from clinic ambulatory in stable condition.  Alert and oriented X 3.  Follow up with [facility] as scheduled.

## 2023-08-08 NOTE — Patient Instructions (Signed)
 [facility] [place] [facility]  Discharge Instructions: Thank you for choosing [facility] to provide your oncology and hematology care.  If you have a lab appointment with the Cancer Center - please note that after April 8th, 2024, all labs will be drawn in the cancer center.  You do not have to check in or register with the main entrance as you have in the past but will complete your check-in in the cancer center.  Wear comfortable clothing and clothing appropriate for easy access to any Portacath or PICC line.   We strive to give you quality time with your provider. You may need to reschedule your appointment if you arrive late (15 or more minutes).  Arriving late affects you and other patients whose appointments are after yours.  Also, if you miss three or more appointments without notifying the office, you may be dismissed from the clinic at the provider's discretion.      For prescription refill requests, have your pharmacy contact our office and allow 72 hours for refills to be completed.    Today you received the following chemotherapy and/or immunotherapy agents vidaza.       To help prevent nausea and vomiting after your treatment, we encourage you to take your nausea medication as directed.  BELOW ARE SYMPTOMS THAT SHOULD BE REPORTED IMMEDIATELY: *FEVER GREATER THAN 100.4 F (38 C) OR HIGHER *CHILLS OR SWEATING *NAUSEA AND VOMITING THAT IS NOT CONTROLLED WITH YOUR NAUSEA MEDICATION *UNUSUAL SHORTNESS OF BREATH *UNUSUAL BRUISING OR BLEEDING *URINARY PROBLEMS (pain or burning when urinating, or frequent urination) *BOWEL PROBLEMS (unusual diarrhea, constipation, pain near the anus) TENDERNESS IN MOUTH AND THROAT WITH OR WITHOUT PRESENCE OF ULCERS (sore throat, sores in mouth, or a toothache) UNUSUAL RASH, SWELLING OR PAIN  UNUSUAL VAGINAL DISCHARGE OR ITCHING   Items with * indicate a potential emergency and should be followed up  as soon as possible or go to the Emergency Department if any problems should occur.  Please show the CHEMOTHERAPY ALERT CARD or IMMUNOTHERAPY ALERT CARD at check-in to the Emergency Department and triage nurse.  Should you have questions after your visit or need to cancel or reschedule your appointment, please contact [facility] [place] - A DEPT OF [facility]ideg[facility] [phone]  and follow the prompts.  Office hours are 8:00 a.m. to 4:30 p.m. Monday - Friday. Please note that voicemails left after 4:00 p.m. may not be returned until the following business day.  We are closed weekends and major holidays. You have access to a nurse at all times for urgent questions. Please call the main number to the clinic [phone] and follow the prompts.  For any non-urgent questions, you may also contact your provider using MyChart. We now offer e-Visits for anyone 8 and older to request care online for non-urgent symptoms. For details visit [email or web address].   Also download the MyChart app! Go to the app store, search "MyChart", open the app, select [place], and log in with your MyChart username and password.

## 2023-08-21 ENCOUNTER — Other Ambulatory Visit: Payer: Self-pay

## 2023-08-22 ENCOUNTER — Other Ambulatory Visit: Payer: Self-pay

## 2023-08-22 NOTE — Progress Notes (Signed)
 Specialty Pharmacy Refill Coordination Note  Chad Lamb is a 78 y.o. male contacted today regarding refills of specialty medication(s) Venetoclax  (VENCLEXTA )   Patient requested No data recorded  Delivery date: 09/17/23   Verified address: (Patient-Rptd) [address] [place], [place]   Medication will be filled on 09/16/23.

## 2023-09-02 ENCOUNTER — Other Ambulatory Visit: Payer: Self-pay

## 2023-09-02 NOTE — Progress Notes (Signed)
 Specialty Pharmacy Ongoing Clinical Assessment Note  Chad Lamb is a 78 y.o. male who is being followed by the specialty pharmacy service for [facility] Oncology   Patient's specialty medication(s) reviewed today: Venetoclax  (VENCLEXTA )   Missed doses in the last 4 weeks: 0   Patient/Caregiver did not have any additional questions or concerns.   Therapeutic benefit summary: Patient is achieving benefit   Adverse events/side effects summary: No adverse events/side effects (pt stated he had a small rash on his back and unsure if related to venclexta , derm said it looked benign and it's not bothering him at this time.)   Patient's therapy is appropriate to: Continue    Goals Addressed             This Visit's Progress    Stabilization of disease   On track    Patient is on track. Patient will maintain adherence. Per 5/19 office visit, white count and platelets are normal and hemoglobin is stable.         Follow up: 6 months  [facility]

## 2023-09-13 ENCOUNTER — Other Ambulatory Visit: Payer: Self-pay

## 2023-09-15 ENCOUNTER — Ambulatory Visit

## 2023-09-15 ENCOUNTER — Ambulatory Visit: Admitting: Hematology

## 2023-09-15 ENCOUNTER — Other Ambulatory Visit

## 2023-09-22 ENCOUNTER — Inpatient Hospital Stay

## 2023-09-22 ENCOUNTER — Inpatient Hospital Stay (HOSPITAL_BASED_OUTPATIENT_CLINIC_OR_DEPARTMENT_OTHER): Admitting: Hematology

## 2023-09-22 ENCOUNTER — Inpatient Hospital Stay: Attending: Hematology

## 2023-09-22 ENCOUNTER — Encounter: Payer: Self-pay | Admitting: Hematology

## 2023-09-22 VITALS — BP 139/80 | HR 61 | Temp 97.0°F | Resp 18

## 2023-09-22 DIAGNOSIS — Z5111 Encounter for antineoplastic chemotherapy: Secondary | ICD-10-CM | POA: Diagnosis not present

## 2023-09-22 DIAGNOSIS — D4622 Refractory anemia with excess of blasts 2: Secondary | ICD-10-CM | POA: Insufficient documentation

## 2023-09-22 DIAGNOSIS — Z95828 Presence of other vascular implants and grafts: Secondary | ICD-10-CM

## 2023-09-22 DIAGNOSIS — D469 Myelodysplastic syndrome, unspecified: Secondary | ICD-10-CM

## 2023-09-22 LAB — COMPREHENSIVE METABOLIC PANEL WITH GFR
ALT: 16 U/L (ref 0–44)
AST: 22 U/L (ref 15–41)
Albumin: 3.8 g/dL (ref 3.5–5.0)
Alkaline Phosphatase: 67 U/L (ref 38–126)
Anion gap: 10 (ref 5–15)
BUN: 16 mg/dL (ref 8–23)
CO2: 24 mmol/L (ref 22–32)
Calcium: 9.5 mg/dL (ref 8.9–10.3)
Chloride: 105 mmol/L (ref 98–111)
Creatinine, Ser: 0.92 mg/dL (ref 0.61–1.24)
GFR, Estimated: >60 mL/min (ref >60)
Glucose, Bld: 115 mg/dL — ABNORMAL HIGH (ref 70–99)
Potassium: 3.6 mmol/L (ref 3.5–5.1)
Sodium: 139 mmol/L (ref 135–145)
Total Bilirubin: 1.5 mg/dL — ABNORMAL HIGH (ref 0.0–1.2)
Total Protein: 6.2 g/dL — ABNORMAL LOW (ref 6.5–8.1)

## 2023-09-22 LAB — MAGNESIUM: Magnesium: 2 mg/dL (ref 1.7–2.4)

## 2023-09-22 LAB — CBC WITH DIFFERENTIAL/PLATELET
Abs Immature Granulocytes: 0.03 K/uL (ref 0.00–0.07)
Basophils Absolute: 0 K/uL (ref 0.0–0.1)
Basophils Relative: 1 %
Eosinophils Absolute: 0.9 K/uL — ABNORMAL HIGH (ref 0.0–0.5)
Eosinophils Relative: 14 %
HCT: 38.2 % — ABNORMAL LOW (ref 39.0–52.0)
Hemoglobin: 12.6 g/dL — ABNORMAL LOW (ref 13.0–17.0)
Immature Granulocytes: 1 %
Lymphocytes Relative: 23 %
Lymphs Abs: 1.5 K/uL (ref 0.7–4.0)
MCH: 27 pg (ref 26.0–34.0)
MCHC: 33 g/dL (ref 30.0–36.0)
MCV: 82 fL (ref 80.0–100.0)
Monocytes Absolute: 0.6 K/uL (ref 0.1–1.0)
Monocytes Relative: 10 %
Neutro Abs: 3.3 K/uL (ref 1.7–7.7)
Neutrophils Relative %: 51 %
Platelets: 220 K/uL (ref 150–400)
RBC: 4.66 MIL/uL (ref 4.22–5.81)
RDW: 17.2 % — ABNORMAL HIGH (ref 11.5–15.5)
WBC: 6.3 K/uL (ref 4.0–10.5)
nRBC: 0 % (ref 0.0–0.2)

## 2023-09-22 MED ORDER — PALONOSETRON HCL INJECTION 0.25 MG/5ML
0.2500 mg | Freq: Once | INTRAVENOUS | Status: AC
  Administered 2023-09-22: 0.25 mg via INTRAVENOUS
  Filled 2023-09-22: qty 5

## 2023-09-22 MED ORDER — HEPARIN SOD (PORK) LOCK FLUSH 100 UNIT/ML IV SOLN
500.0000 [IU] | Freq: Once | INTRAVENOUS | Status: AC | PRN
  Administered 2023-09-22: 500 [IU]

## 2023-09-22 MED ORDER — SODIUM CHLORIDE 0.9 % IV SOLN
75.0000 mg/m2 | Freq: Once | INTRAVENOUS | Status: AC
  Administered 2023-09-22: 140 mg via INTRAVENOUS
  Filled 2023-09-22: qty 14

## 2023-09-22 MED ORDER — DEXAMETHASONE SODIUM PHOSPHATE 10 MG/ML IJ SOLN
10.0000 mg | Freq: Once | INTRAMUSCULAR | Status: AC
  Administered 2023-09-22: 10 mg via INTRAVENOUS
  Filled 2023-09-22: qty 1

## 2023-09-22 MED ORDER — PROCHLORPERAZINE MALEATE 10 MG PO TABS: 10.0000 mg | ORAL_TABLET | Freq: Four times a day (QID) | ORAL | 2 refills | Status: AC | PRN

## 2023-09-22 MED ORDER — SODIUM CHLORIDE 0.9% FLUSH
10.0000 mL | INTRAVENOUS | Status: DC | PRN
  Administered 2023-09-22: 10 mL via INTRAVENOUS

## 2023-09-22 MED ORDER — SODIUM CHLORIDE 0.9 % IV SOLN
Freq: Once | INTRAVENOUS | Status: AC
Start: 2023-09-22 — End: 2023-09-22

## 2023-09-22 NOTE — Progress Notes (Signed)
 [facility] [address], [place] [address]    Clinic Day:  09/22/2023  Referring physician: [name], MD  Patient Care Team: [name], MD as PCP - General (Family Medicine) [name], MD (Family Medicine) Harvey Margo CROME, MD (Inactive) as Consulting Physician (Gastroenterology) Rogers Hai, MD as Medical Oncologist (Hematology)   ASSESSMENT & PLAN:   Assessment: 1. MDS with EB 2: - Seen at the request of Dr. Rolinda for cytopenias - BMBX on 06/04/2021: Hypercellular marrow with dyspoietic changes involving the granulocytic cell line and megakaryocytes.  Myeloblasts 8% on aspirate smears and 10% by flow.  Cytogenetics 34, XY (20).  MDS FISH panel normal. 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 - IPSS-M score of 0.12, moderate high risk.  Leukemia free survival is 2.3 years.  Overall survival 2.8 years. - Cycle 1 of azacitidine  on 07/30/2021.  BMBX (01/04/2022): Overall improvement in blast count and cellularity.  Chromosome analysis and FISH panel normal.  Venetoclax  200 mg day 1 through 14 added with cycle 8 on 02/18/2022. - BMBX (06/05/2022): Slightly hypercellular for age with very mild dyspoietic changes at best associated with increased number of eosinophilic cells.  No increase in blast cells identified. - Azacitidine  5 days and venetoclax  200 mg days 1-14 every 6 weeks on 07/22/2022   2. Social/family history: - He lives at home with his wife.  He swims about 40 minutes daily. 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  He also worked as a Geologist, engineering.  Non-smoker. - Paternal aunt had breast cancer and mother had pancreatic cancer.  3.  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 - He underwent thrombolytic therapy at [facility]. - He will followed up with vascular at [facility] and has been on Xarelto  since then.    Plan: 1. MDS with EB 2: - After last treatment, he  did not experience any infections.  No major GI side effects other than diarrhea on 09/11/2023 which lasted about 4 days and improved with Pepto-Bismol.  He has occasional nausea and Compazine  helps. - He noted blanching erythematous patches, on his medial left arm, left breast area and suprapubic area.  He was evaluated by Dr. Eda of dermatology on 08/19/2023 and a biopsy was done of the suprapubic rash.  He was told that it was nonmalignant.  I have recommended that he use hydrocortisone  cream twice daily as needed.  Rash is not itching. - I reviewed labs today: LFTs and creatinine normal.  CBC grossly normal. - Recommend proceeding with azacitidine  75 mg/m x 5 days.  Continue venetoclax  200 mg daily on days 1-14 cycle every 6 weeks.  RTC 6 weeks for follow-up.  Consider bone marrow biopsy if there is any significant change in his blood counts.   2. Unprovoked left subclavian DVT: - Xarelto  discontinued due to hemorrhoidal bleeding.  No evidence of recurrence of DVT.   3. Constipation: - Continue Colace daily and MiraLAX as needed.    No orders of the defined types were placed in this encounter.     LILLETTE Verneta SAUNDERS Teague,acting as a Neurosurgeon for Hai Rogers, MD.,have documented all relevant documentation on the behalf of Hai Rogers, MD,as directed by  Hai Rogers, MD while in the presence of Hai Rogers, MD.  I, Hai Rogers MD, have reviewed the above documentation for accuracy and completeness, and I agree with the above.     Hai  Rogers, MD   7/7/20252:17 PM  CHIEF COMPLAINT:   Diagnosis: MDS with EB 2    Cancer Staging  No matching staging information was found for the patient.    Prior Therapy: none  Current Therapy:  Azacitidine  and venetoclax     HISTORY OF PRESENT ILLNESS:   Oncology History  Myelodysplastic syndrome (HCC)  06/17/2021 Initial Diagnosis   Myelodysplastic syndrome (HCC)   07/30/2021 - 10/30/2021  Chemotherapy   Patient is on Treatment Plan : MYELODYSPLASIA  Azacitidine  IV D1-7 q28d     07/30/2021 -  Chemotherapy   Patient is on Treatment Plan : MYELODYSPLASIA  Azacitidine  IV D1-5 q42d        INTERVAL HISTORY:   Chad Lamb is a 78 y.o. male presenting to clinic today for follow up of MDS with EB 2. He was last seen by me on 08/04/23.  Today, he states that he is doing well overall. His appetite level is at 100%. His energy level is at 70%.  PAST MEDICAL HISTORY:   Past Medical History: Past Medical History:  Diagnosis Date   Allergy    Anxiety    Arthritis    Asthma    Cataract    Clotting disorder (HCC)    DVT (deep venous thrombosis) (HCC)    unknown etiology. [facility]   GERD (gastroesophageal reflux disease)    History of dental surgery    feb 2023   Hyperlipidemia    Hypertension    MDS (myelodysplastic syndrome), high grade (HCC) 06/17/2021   Pneumonia    Port-A-Cath in place 07/30/2021    Surgical History: Past Surgical History:  Procedure Laterality Date   CATARACT EXTRACTION W/PHACO Right 11/24/2017   Procedure: CATARACT EXTRACTION PHACO AND INTRAOCULAR LENS PLACEMENT RIGHT EYE;  Surgeon: Perley Hamilton, MD;  Location: [facility];  Service: Ophthalmology;  Laterality: Right;  CDE: 8.61   CATARACT EXTRACTION W/PHACO Left 12/29/2017   Procedure: CATARACT EXTRACTION PHACO AND INTRAOCULAR LENS PLACEMENT (IOC);  Surgeon: Perley Hamilton, MD;  Location: [facility];  Service: Ophthalmology;  Laterality: Left;  CDE: 7.21   GANGLION CYST EXCISION     HERNIA REPAIR     bilateral   IR BONE MARROW BIOPSY & ASPIRATION  06/05/2022   PARS PLANA VITRECTOMY Left 05/17/2021   Procedure: PARS PLANA VITRECTOMY 25 GAUGE WITH ANTIBIOTIC INJECTION  AND ENDOLASER LEFT EYE;  Surgeon: Tobie Baptist, MD;  Location: [facility];  Service: Ophthalmology;  Laterality: Left;   PORTACATH PLACEMENT Right 07/09/2021   Procedure: INSERTION PORT-A-CATH;  Surgeon: Mavis Anes, MD;  Location: [facility];  Service:  General;  Laterality: Right;   TONSILLECTOMY     VASCULAR SURGERY      Social History: Social History   Socioeconomic History   Marital status: Married    Spouse name: Inocente   Number of children: 0   Years of education: 13   Highest education level: High school graduate  Occupational History   Not on file  Tobacco Use   Smoking status: Former    Types: Pipe    Quit date: 03/21/1966    Years since quitting: 57.5   Smokeless tobacco: Never  Vaping Use   Vaping status: Never Used  Substance and Sexual Activity   Alcohol use: Yes    Alcohol/week: 1.0 standard drink of alcohol    Types: 1 Cans of beer per week    Comment: 1 can of beer a week   Drug use: Never   Sexual activity: Not Currently  Other Topics  Concern   Not on file  Social History Narrative   ** Merged History Encounter **       Grew up in [place], [place]  and [place] .  Retired.  Worked in [place]. 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    Social Drivers of Corporate investment banker Strain: Low Risk  (01/23/2017)   Overall Financial Resource Strain (CARDIA)    Difficulty of Paying Living Expenses: Not hard at all  Food Insecurity: No Food Insecurity (01/23/2017)   Hunger Vital Sign    Worried About Running Out of Food in the Last Year: Never true    Ran Out of Food in the Last Year: Never true  Transportation Needs: No Transportation Needs (01/23/2017)   PRAPARE - Administrator, Civil Service (Medical): No    Lack of Transportation (Non-Medical): No  Physical Activity: Unknown (01/23/2017)   Exercise Vital Sign    Days of Exercise per Week: 3 days    Minutes of Exercise per Session: Not on file  Stress: Stress Concern Present (01/23/2017)   [employer] of Occupational Health - Occupational Stress Questionnaire    Feeling of Stress : To some extent  Social Connections: Somewhat Isolated (01/23/2017)   Social Connection and Isolation Panel     Frequency of Communication with Friends and Family: More than three times a week    Frequency of Social Gatherings with Friends and Family: More than three times a week    Attends Religious Services: Never    Database administrator or Organizations: No    Attends Banker Meetings: Never    Marital Status: Married  Catering manager Violence: Not At Risk (01/23/2017)   Humiliation, Afraid, Rape, and Kick questionnaire    Fear of Current or Ex-Partner: No    Emotionally Abused: No    Physically Abused: No    Sexually Abused: No    Family History: Family History  Problem Relation Age of Onset   Cancer Mother        pancreatic   Arthritis Father    Early death Father 27       Lung infiltrate   Colon cancer Neg Hx    Colon polyps Neg Hx     Current Medications:  Current Outpatient Medications:    albuterol  (PROVENTIL  HFA;VENTOLIN  HFA) 108 (90 Base) MCG/ACT inhaler, Inhale 1-2 puffs into the lungs every 6 (six) hours as needed for wheezing or shortness of breath., Disp: , Rfl:    ALPRAZolam (XANAX) 0.5 MG tablet, Take 0.5 mg by mouth 2 (two) times daily as needed for anxiety., Disp: , Rfl:    amLODipine (NORVASC) 2.5 MG tablet, Take 2.5 mg by mouth daily., Disp: , Rfl:    azaCITIDine  5 mg/2 mLs in lactated ringers  infusion, Inject into the vein daily. Days 1-7 every 28 days, Disp: , Rfl:    DENTA 5000 PLUS 1.1 % CREA dental cream, Take by mouth., Disp: , Rfl:    docusate sodium (COLACE) 100 MG capsule, 1 capsule Orally daily (3 daily when on chemo), Disp: , Rfl:    fluticasone  furoate-vilanterol (BREO ELLIPTA) 200-25 MCG/ACT AEPB, Inhale 1 puff into the lungs daily., Disp: , Rfl:    Lactulose  20 GM/30ML SOLN, Take 30 mLs (20 g total) by mouth at bedtime., Disp: 473 mL, Rfl: 3   Lidocaine -Hydrocort , Perianal, 3-0.5 % CREA, Apply topically 2 (two) times daily as needed., Disp: , Rfl:    Lidocaine -Hydrocortisone   Ace 3-0.5 % CREA, Apply 1 application topically as directed.  (Patient taking differently: Apply 1 application  topically 2 (two) times daily as needed (hemorrhoids).), Disp: 30 Tube, Rfl: 11   lidocaine -prilocaine  (EMLA ) cream, Apply a SMALL AMOUNT TO port a cath site (DO not RUB in) AND cover with PLASTIC WRAP 1 hour prior TO infusion appointment, Disp: 30 g, Rfl: 3   loratadine (CLARITIN) 10 MG tablet, Take 10 mg by mouth daily., Disp: , Rfl:    Lutein 40 MG CAPS, Take 40 mg by mouth daily., Disp: , Rfl:    Multiple Vitamins-Minerals (CENTRUM ADULTS PO), Take 1 tablet by mouth daily., Disp: , Rfl:    nystatin cream (MYCOSTATIN), Apply 1 application. topically 2 (two) times daily as needed for dry skin., Disp: , Rfl:    ofloxacin (OCUFLOX) 0.3 % ophthalmic solution, Place 1 drop into the left eye See admin instructions. 1 drop to left eye 4 x daily for 7 days after monthly procedure., Disp: , Rfl:    omeprazole (PRILOSEC OTC) 20 MG tablet, Take 5-10 mg by mouth daily., Disp: , Rfl:    sildenafil (VIAGRA) 50 MG tablet, Take 50 mg by mouth daily as needed for erectile dysfunction., Disp: , Rfl:    traMADol  (ULTRAM ) 50 MG tablet, Take 1 tablet (50 mg total) by mouth every 6 (six) hours as needed., Disp: 15 tablet, Rfl: 0   venetoclax  (VENCLEXTA ) 100 MG tablet, Take 2 tablets (200 mg total) by mouth daily. Take for 14 days, then hold for 28 days. Repeat every 42 days. Take with a meal and a full glass of water ., Disp: 28 tablet, Rfl: 1   prochlorperazine  (COMPAZINE ) 10 MG tablet, Take 1 tablet (10 mg total) by mouth every 6 (six) hours as needed (Nausea or vomiting)., Disp: 30 tablet, Rfl: 2 No current facility-administered medications for this visit.  Facility-Administered Medications Ordered in Other Visits:    azaCITIDine  (VIDAZA ) 140 mg in sodium chloride  0.9 % 50 mL chemo infusion, 75 mg/m2 (Treatment Plan Recorded), Intravenous, Once, Rogers Hai, MD, Last Rate: 256 mL/hr at 09/22/23 1415, 140 mg at 09/22/23 1415   heparin  lock flush 100 unit/mL,  500 Units, Intracatheter, Once PRN, Markeshia Giebel, MD   sodium chloride  flush (NS) 0.9 % injection 10 mL, 10 mL, Intracatheter, PRN, Constantinos Krempasky, MD, 10 mL at 06/12/22 1606   Allergies: Allergies  Allergen Reactions   Cephalosporins Itching   Amoxapine And Related Itching and Rash    Has patient had a PCN reaction causing immediate rash, facial/tongue/throat swelling, SOB or lightheadedness with hypotension: No Has patient had a PCN reaction causing severe rash involving mucus membranes or skin necrosis: No Has patient had a PCN reaction that required hospitalization: No Has patient had a PCN reaction occurring within the last 10 years: No If all of the above answers are NO, then may proceed with Cephalosporin use.     Amoxicillin Rash   Cephalexin Rash   Clindamycin/Lincomycin Itching and Rash   Sulfamethoxazole Itching and Rash   Trimethoprim Rash    REVIEW OF SYSTEMS:   Review of Systems  Constitutional:  Negative for chills, fatigue and fever.  HENT:   Negative for lump/mass, mouth sores, nosebleeds, sore throat and trouble swallowing.   Eyes:  Negative for eye problems.  Respiratory:  Negative for cough and shortness of breath.   Cardiovascular:  Negative for chest pain, leg swelling and palpitations.  Gastrointestinal:  Positive for diarrhea and nausea. Negative for abdominal pain, constipation and  vomiting.  Genitourinary:  Negative for bladder incontinence, difficulty urinating, dysuria, frequency, hematuria and nocturia.   Musculoskeletal:  Negative for arthralgias, back pain, flank pain, myalgias and neck pain.  Skin:  Negative for itching and rash.  Neurological:  Negative for dizziness, headaches and numbness.  Hematological:  Does not bruise/bleed easily.  Psychiatric/Behavioral:  Negative for depression, sleep disturbance and suicidal ideas. The patient is not nervous/anxious.   All other systems reviewed and are negative.    VITALS:   There  were no vitals taken for this visit.  Wt Readings from Last 3 Encounters:  09/22/23 150 lb 12.7 oz (68.4 kg)  08/04/23 154 lb (69.9 kg)  06/23/23 150 lb 5.7 oz (68.2 kg)    There is no height or weight on file to calculate BMI.  Performance status (ECOG): 1 - Symptomatic but completely ambulatory  PHYSICAL EXAM:   Physical Exam Vitals and nursing note reviewed. Exam conducted with a chaperone present.  Constitutional:      Appearance: Normal appearance.  Cardiovascular:     Rate and Rhythm: Normal rate and regular rhythm.     Pulses: Normal pulses.     Heart sounds: Normal heart sounds.  Pulmonary:     Effort: Pulmonary effort is normal.     Breath sounds: Normal breath sounds.  Abdominal:     Palpations: Abdomen is soft. There is no hepatomegaly, splenomegaly or mass.     Tenderness: There is no abdominal tenderness.  Musculoskeletal:     Right lower leg: No edema.     Left lower leg: No edema.  Lymphadenopathy:     Cervical: No cervical adenopathy.     Right cervical: No superficial, deep or posterior cervical adenopathy.    Left cervical: No superficial, deep or posterior cervical adenopathy.     Upper Body:     Right upper body: No supraclavicular or axillary adenopathy.     Left upper body: No supraclavicular or axillary adenopathy.  Neurological:     General: No focal deficit present.     Mental Status: He is alert and oriented to person, place, and time.  Psychiatric:        Mood and Affect: Mood normal.        Behavior: Behavior normal.     LABS:   CBC     Component Value Date/Time   WBC 6.3 09/22/2023 1202   RBC 4.66 09/22/2023 1202   HGB 12.6 (L) 09/22/2023 1202   HCT 38.2 (L) 09/22/2023 1202   PLT 220 09/22/2023 1202   MCV 82.0 09/22/2023 1202   MCH 27.0 09/22/2023 1202   MCHC 33.0 09/22/2023 1202   RDW 17.2 (H) 09/22/2023 1202   LYMPHSABS 1.5 09/22/2023 1202   MONOABS 0.6 09/22/2023 1202   EOSABS 0.9 (H) 09/22/2023 1202   BASOSABS 0.0  09/22/2023 1202    CMP      Component Value Date/Time   NA 139 09/22/2023 1202   K 3.6 09/22/2023 1202   CL 105 09/22/2023 1202   CO2 24 09/22/2023 1202   GLUCOSE 115 (H) 09/22/2023 1202   BUN 16 09/22/2023 1202   CREATININE 0.92 09/22/2023 1202   CREATININE 0.98 02/12/2017 1421   CALCIUM 9.5 09/22/2023 1202   PROT 6.2 (L) 09/22/2023 1202   ALBUMIN 3.8 09/22/2023 1202   AST 22 09/22/2023 1202   ALT 16 09/22/2023 1202   ALKPHOS 67 09/22/2023 1202   BILITOT 1.5 (H) 09/22/2023 1202   GFRNONAA >60 09/22/2023 1202   GFRNONAA  77 02/12/2017 1421   GFRAA >60 11/19/2017 1532   GFRAA 89 02/12/2017 1421     No results found for: CEA1, CEA / No results found for: CEA1, CEA No results found for: PSA1 No results found for: CAN199 No results found for: RJW874  Lab Results  Component Value Date   TOTALPROTELP 6.3 01/05/2021   ALBUMINELP 3.3 01/05/2021   A1GS 0.3 01/05/2021   A2GS 0.8 01/05/2021   BETS 1.0 01/05/2021   GAMS 1.0 01/05/2021   MSPIKE Not Observed 01/05/2021   SPEI Comment 01/05/2021   No results found for: TIBC, FERRITIN, IRONPCTSAT Lab Results  Component Value Date   LDH 147 11/20/2021   LDH 143 10/22/2021   LDH 144 10/16/2021     STUDIES:   No results found.

## 2023-09-22 NOTE — Patient Instructions (Signed)
 [facility] [place] - A DEPT OF [facility]  Discharge Instructions: Thank you for choosing [facility] to provide your oncology and hematology care.  If you have a lab appointment with the Cancer Center - please note that after April 8th, 2024, all labs will be drawn in the cancer center.  You do not have to check in or register with the main entrance as you have in the past but will complete your check-in in the cancer center.  Wear comfortable clothing and clothing appropriate for easy access to any Portacath or PICC line.   We strive to give you quality time with your provider. You may need to reschedule your appointment if you arrive late (15 or more minutes).  Arriving late affects you and other patients whose appointments are after yours.  Also, if you miss three or more appointments without notifying the office, you may be dismissed from the clinic at the provider's discretion.      For prescription refill requests, have your pharmacy contact our office and allow 72 hours for refills to be completed.    Today you received the following chemotherapy and/or immunotherapy agents D1 Vidaza    To help prevent nausea and vomiting after your treatment, we encourage you to take your nausea medication as directed.  BELOW ARE SYMPTOMS THAT SHOULD BE REPORTED IMMEDIATELY: *FEVER GREATER THAN 100.4 F (38 C) OR HIGHER *CHILLS OR SWEATING *NAUSEA AND VOMITING THAT IS NOT CONTROLLED WITH YOUR NAUSEA MEDICATION *UNUSUAL SHORTNESS OF BREATH *UNUSUAL BRUISING OR BLEEDING *URINARY PROBLEMS (pain or burning when urinating, or frequent urination) *BOWEL PROBLEMS (unusual diarrhea, constipation, pain near the anus) TENDERNESS IN MOUTH AND THROAT WITH OR WITHOUT PRESENCE OF ULCERS (sore throat, sores in mouth, or a toothache) UNUSUAL RASH, SWELLING OR PAIN  UNUSUAL VAGINAL DISCHARGE OR ITCHING   Items with * indicate a potential emergency and should be followed up as  soon as possible or go to the Emergency Department if any problems should occur.  Please show the CHEMOTHERAPY ALERT CARD or IMMUNOTHERAPY ALERT CARD at check-in to the Emergency Department and triage nurse.  Should you have questions after your visit or need to cancel or reschedule your appointment, please contact [facility] [place] - A DEPT OF [facility] [phone]  and follow the prompts.  Office hours are 8:00 a.m. to 4:30 p.m. Monday - Friday. Please note that voicemails left after 4:00 p.m. may not be returned until the following business day.  We are closed weekends and major holidays. You have access to a nurse at all times for urgent questions. Please call the main number to the clinic [phone] and follow the prompts.  For any non-urgent questions, you may also contact your provider using MyChart. We now offer e-Visits for anyone 34 and older to request care online for non-urgent symptoms. For details visit [email or web address].   Also download the MyChart app! Go to the app store, search MyChart, open the app, select [place], and log in with your MyChart username and password.

## 2023-09-22 NOTE — Patient Instructions (Addendum)
 [facility] at [facility] Discharge Instructions   You were seen and examined today by Dr. Rogers.  He reviewed the results of your lab work which are normal/stable.   Use over the counter hydrocortisone  cream to treat your rash.   We will proceed with your treatment today.   Return as scheduled.    Thank you for choosing [facility] at [facility] to provide your oncology and hematology care.  To afford each patient quality time with our provider, please arrive at least 15 minutes before your scheduled appointment time.   If you have a lab appointment with the Cancer Center please come in thru the Main Entrance and check in at the main information desk.  You need to re-schedule your appointment should you arrive 10 or more minutes late.  We strive to give you quality time with our providers, and arriving late affects you and other patients whose appointments are after yours.  Also, if you no show three or more times for appointments you may be dismissed from the clinic at the providers discretion.     Again, thank you for choosing [facility].  Our hope is that these requests will decrease the amount of time that you wait before being seen by our physicians.       _____________________________________________________________  Should you have questions after your visit to [facility], please contact our office at [phone] and follow the prompts.  Our office hours are 8:00 a.m. and 4:30 p.m. Monday - Friday.  Please note that voicemails left after 4:00 p.m. may not be returned until the following business day.  We are closed weekends and major holidays.  You do have access to a nurse 24-7, just call the main number to the clinic [phone] and do not press any options, hold on the line and a nurse will answer the phone.    For prescription refill requests, have your pharmacy contact our office and allow 72  hours.    Due to Covid, you will need to wear a mask upon entering the hospital. If you do not have a mask, a mask will be given to you at the Main Entrance upon arrival. For doctor visits, patients may have 1 support person age 79 or older with them. For treatment visits, patients can not have anyone with them due to social distancing guidelines and our immunocompromised population.

## 2023-09-22 NOTE — Progress Notes (Signed)
Patient presents today for D1 Vidaza infusion. Patient is in satisfactory condition with no new complaints voiced.  Vital signs are stable.  Labs reviewed by Dr. Ellin Saba during the office visit and all labs are within treatment parameters.  We will proceed with treatment per MD orders.   Treatment given today per MD orders. Tolerated infusion without adverse affects. Vital signs stable. No complaints at this time. Discharged from clinic ambulatory in stable condition. Alert and oriented x 3. F/U with [facility] as scheduled.

## 2023-09-22 NOTE — Progress Notes (Signed)
Patient is taking Venetoclax as prescribed.  He has not missed any doses and reports no side effects at this time.   

## 2023-09-23 ENCOUNTER — Inpatient Hospital Stay

## 2023-09-23 ENCOUNTER — Encounter: Payer: Self-pay | Admitting: Hematology

## 2023-09-23 VITALS — BP 124/72 | HR 69 | Temp 97.2°F | Resp 18

## 2023-09-23 DIAGNOSIS — D469 Myelodysplastic syndrome, unspecified: Secondary | ICD-10-CM

## 2023-09-23 DIAGNOSIS — Z5111 Encounter for antineoplastic chemotherapy: Secondary | ICD-10-CM | POA: Diagnosis not present

## 2023-09-23 DIAGNOSIS — Z95828 Presence of other vascular implants and grafts: Secondary | ICD-10-CM

## 2023-09-23 MED ORDER — HEPARIN SOD (PORK) LOCK FLUSH 100 UNIT/ML IV SOLN
500.0000 [IU] | Freq: Once | INTRAVENOUS | Status: AC | PRN
  Administered 2023-09-23: 500 [IU]

## 2023-09-23 MED ORDER — SODIUM CHLORIDE 0.9 % IV SOLN
75.0000 mg/m2 | Freq: Once | INTRAVENOUS | Status: AC
  Administered 2023-09-23: 140 mg via INTRAVENOUS
  Filled 2023-09-23: qty 14

## 2023-09-23 MED ORDER — SODIUM CHLORIDE 0.9 % IV SOLN
Freq: Once | INTRAVENOUS | Status: AC
Start: 2023-09-23 — End: 2023-09-23

## 2023-09-23 MED ORDER — DEXAMETHASONE SODIUM PHOSPHATE 10 MG/ML IJ SOLN
10.0000 mg | Freq: Once | INTRAMUSCULAR | Status: AC
  Administered 2023-09-23: 10 mg via INTRAVENOUS
  Filled 2023-09-23: qty 1

## 2023-09-23 MED ORDER — SODIUM CHLORIDE 0.9% FLUSH
10.0000 mL | INTRAVENOUS | Status: DC | PRN
  Administered 2023-09-23: 10 mL

## 2023-09-23 NOTE — Progress Notes (Signed)
Patient presents today for Vidaza infusion per providers order.  Vital signs WNL.  Patient has no new complaints at this time.  Treatment given today per MD orders.  Stable during infusion without adverse affects.  Vital signs stable.  No complaints at this time.  Discharge from clinic ambulatory in stable condition.  Alert and oriented X 3.  Follow up with [facility] as scheduled.

## 2023-09-23 NOTE — Patient Instructions (Signed)
 [facility] [place] - A DEPT OF [facility]  Discharge Instructions: Thank you for choosing [facility] to provide your oncology and hematology care.  If you have a lab appointment with the Cancer Center - please note that after April 8th, 2024, all labs will be drawn in the cancer center.  You do not have to check in or register with the main entrance as you have in the past but will complete your check-in in the cancer center.  Wear comfortable clothing and clothing appropriate for easy access to any Portacath or PICC line.   We strive to give you quality time with your provider. You may need to reschedule your appointment if you arrive late (15 or more minutes).  Arriving late affects you and other patients whose appointments are after yours.  Also, if you miss three or more appointments without notifying the office, you may be dismissed from the clinic at the provider's discretion.      For prescription refill requests, have your pharmacy contact our office and allow 72 hours for refills to be completed.    Today you received the following chemotherapy and/or immunotherapy agents Vidaza      To help prevent nausea and vomiting after your treatment, we encourage you to take your nausea medication as directed.  BELOW ARE SYMPTOMS THAT SHOULD BE REPORTED IMMEDIATELY: *FEVER GREATER THAN 100.4 F (38 C) OR HIGHER *CHILLS OR SWEATING *NAUSEA AND VOMITING THAT IS NOT CONTROLLED WITH YOUR NAUSEA MEDICATION *UNUSUAL SHORTNESS OF BREATH *UNUSUAL BRUISING OR BLEEDING *URINARY PROBLEMS (pain or burning when urinating, or frequent urination) *BOWEL PROBLEMS (unusual diarrhea, constipation, pain near the anus) TENDERNESS IN MOUTH AND THROAT WITH OR WITHOUT PRESENCE OF ULCERS (sore throat, sores in mouth, or a toothache) UNUSUAL RASH, SWELLING OR PAIN  UNUSUAL VAGINAL DISCHARGE OR ITCHING   Items with * indicate a potential emergency and should be followed up as  soon as possible or go to the Emergency Department if any problems should occur.  Please show the CHEMOTHERAPY ALERT CARD or IMMUNOTHERAPY ALERT CARD at check-in to the Emergency Department and triage nurse.  Should you have questions after your visit or need to cancel or reschedule your appointment, please contact [facility] [place] - A DEPT OF Eligha Bridegroom [facility] [phone]  and follow the prompts.  Office hours are 8:00 a.m. to 4:30 p.m. Monday - Friday. Please note that voicemails left after 4:00 p.m. may not be returned until the following business day.  We are closed weekends and major holidays. You have access to a nurse at all times for urgent questions. Please call the main number to the clinic [phone] and follow the prompts.  For any non-urgent questions, you may also contact your provider using MyChart. We now offer e-Visits for anyone 72 and older to request care online for non-urgent symptoms. For details visit [email or web address].   Also download the MyChart app! Go to the app store, search "MyChart", open the app, select [place], and log in with your MyChart username and password.

## 2023-09-24 ENCOUNTER — Inpatient Hospital Stay

## 2023-09-24 VITALS — BP 133/85 | HR 57 | Temp 97.7°F | Resp 18

## 2023-09-24 DIAGNOSIS — D469 Myelodysplastic syndrome, unspecified: Secondary | ICD-10-CM

## 2023-09-24 DIAGNOSIS — Z95828 Presence of other vascular implants and grafts: Secondary | ICD-10-CM

## 2023-09-24 DIAGNOSIS — Z5111 Encounter for antineoplastic chemotherapy: Secondary | ICD-10-CM | POA: Diagnosis not present

## 2023-09-24 MED ORDER — SODIUM CHLORIDE 0.9 % IV SOLN: Freq: Once | INTRAVENOUS | Status: AC

## 2023-09-24 MED ORDER — DEXAMETHASONE SODIUM PHOSPHATE 10 MG/ML IJ SOLN
10.0000 mg | Freq: Once | INTRAMUSCULAR | Status: AC
  Administered 2023-09-24: 10 mg via INTRAVENOUS
  Filled 2023-09-24: qty 1

## 2023-09-24 MED ORDER — HEPARIN SOD (PORK) LOCK FLUSH 100 UNIT/ML IV SOLN
500.0000 [IU] | Freq: Once | INTRAVENOUS | Status: AC | PRN
  Administered 2023-09-24: 500 [IU]

## 2023-09-24 MED ORDER — SODIUM CHLORIDE 0.9% FLUSH
10.0000 mL | INTRAVENOUS | Status: DC | PRN
  Administered 2023-09-24: 10 mL

## 2023-09-24 MED ORDER — PALONOSETRON HCL INJECTION 0.25 MG/5ML
0.2500 mg | Freq: Once | INTRAVENOUS | Status: AC
  Administered 2023-09-24: 0.25 mg via INTRAVENOUS
  Filled 2023-09-24: qty 5

## 2023-09-24 MED ORDER — SODIUM CHLORIDE 0.9 % IV SOLN
75.0000 mg/m2 | Freq: Once | INTRAVENOUS | Status: AC
  Administered 2023-09-24: 140 mg via INTRAVENOUS
  Filled 2023-09-24: qty 14

## 2023-09-24 NOTE — Patient Instructions (Signed)
 [facility] [place] - [facility] DE[facility] OF [facility]  Discharge Instructions: Thank you for choosing [facility] to provide your oncology and hematology care.  If you have a lab appointment with the Cancer Center - please note that after April 8th, 2024, all labs will be drawn in the cancer center.  You do not have to check in or register with the main entrance as you have in the past but will complete your check-in in the cancer center.  Wear comfortable clothing and clothing appropriate for easy access to any Portacath or PICC line.   We strive to give you quality time with your provider. You may need to reschedule your appointment if you arrive late (15 or more minutes).  Arriving late affects you and other patients whose appointments are after yours.  Also, if you miss three or more appointments without notifying the office, you may be dismissed from the clinic at the provider's discretion.      For prescription refill requests, have your pharmacy contact our office and allow 72 hours for refills to be completed.    Today you received the following chemotherapy and/or immunotherapy agents Vidaza.  Azacitidine Injection What is this medication? AZACITIDINE (ay za SITE i deen) treats blood and bone marrow cancers. It works by slowing down the growth of cancer cells. This medicine may be used for other purposes; ask your health care provider or pharmacist if you have questions. COMMON BRAND NAME(S): Vidaza What should I tell my care team before I take this medication? They need to know if you have any of these conditions: Kidney disease Liver disease Low blood cell levels, such as low white cells, platelets, or red blood cells Low levels of albumin in the blood Low levels of bicarbonate in the blood An unusual or allergic reaction to azacitidine, mannitol, other medications, foods, dyes, or preservatives If you or your partner are pregnant or trying to get  pregnant Breast-feeding How should I use this medication? This medication is injected into a vein or under the skin. It is given by your care team in a hospital or clinic setting. Talk to your care team about the use of this medication in children. While it may be prescribed for children as young as 1 month for selected conditions, precautions do apply. Overdosage: If you think you have taken too much of this medicine contact a poison control center or emergency room at once. NOTE: This medicine is only for you. Do not share this medicine with others. What if I miss a dose? Keep appointments for follow-up doses. It is important not to miss your dose. Call your care team if you are unable to keep an appointment. What may interact with this medication? Interactions are not expected. This list may not describe all possible interactions. Give your health care provider a list of all the medicines, herbs, non-prescription drugs, or dietary supplements you use. Also tell them if you smoke, drink alcohol, or use illegal drugs. Some items may interact with your medicine. What should I watch for while using this medication? Your condition will be monitored carefully while you are receiving this medication. This medication may make you feel generally unwell. This is not uncommon as chemotherapy can affect healthy cells as well as cancer cells. Report any side effects. Continue your course of treatment even though you feel ill unless your care team tells you to stop. You may need blood work done while you are  taking this medication. Other product types may be available that contain the medication azacitidine. The injection and oral products should not be used in place of one another. Talk to your care team if you have questions. This medication can cause serious side effects. To reduce the risk, your care team may give you other medications to take before receiving this one. Be sure to follow the directions from  your care team. This medication may increase your risk of getting an infection. Call your care team for advice if you get a fever, chills, sore throat, or other symptoms of a cold or flu. Do not treat yourself. Try to avoid being around people who are sick. Avoid taking medications that contain aspirin, acetaminophen, ibuprofen, naproxen, or ketoprofen unless instructed by your care team. These medications may hide a fever. Be careful brushing or flossing your teeth or using a toothpick because you may get an infection or bleed more easily. If you have any dental work done, tell your dentist you are receiving this medication. Talk to your care team if you or your partner may be pregnant. Serious birth defects can occur if you take this medication during pregnancy and for 6 months after the last dose. You will need a negative pregnancy test before starting this medication. Contraception is recommended while taking this medication and for 6 months after the last dose. Your care team can help you find the option that works for you. If your partner can get pregnant, use a condom during sex while taking this medication and for 3 months after the last dose. Do not breastfeed while taking this medication and for 1 week after the last dose. This medication may cause infertility. Talk to your care team if you are concerned about your fertility. What side effects may I notice from receiving this medication? Side effects that you should report to your care team as soon as possible: Allergic reactions--skin rash, itching, hives, swelling of the face, lips, tongue, or throat Infection--fever, chills, cough, sore throat, wounds that don't heal, pain or trouble when passing urine, general feeling of discomfort or being unwell Kidney injury--decrease in the amount of urine, swelling of the ankles, hands, or feet Liver injury--right upper belly pain, loss of appetite, nausea, light-colored stool, dark yellow or Chad Lamb  urine, yellowing skin or eyes, unusual weakness or fatigue Low red blood cell level--unusual weakness or fatigue, dizziness, headache, trouble breathing Tumor lysis syndrome (TLS)--nausea, vomiting, diarrhea, decrease in the amount of urine, dark urine, unusual weakness or fatigue, confusion, muscle pain or cramps, fast or irregular heartbeat, joint pain Unusual bruising or bleeding Side effects that usually do not require medical attention (report to your care team if they continue or are bothersome): Constipation Diarrhea Nausea Pain, redness, or irritation at injection site Vomiting This list may not describe all possible side effects. Call your doctor for medical advice about side effects. You may report side effects to FDA at 1-800-FDA-1088. Where should I keep my medication? This medication is given in a hospital or clinic. It will not be stored at home. NOTE: This sheet is a summary. It may not cover all possible information. If you have questions about this medicine, talk to your doctor, pharmacist, or health care provider.  2024 Elsevier/Gold Standard (2022-11-04 00:00:00)       To help prevent nausea and vomiting after your treatment, we encourage you to take your nausea medication as directed.  BELOW ARE SYMPTOMS THAT SHOULD BE REPORTED IMMEDIATELY: *FEVER GREATER THAN 100.4  F (38 C) OR HIGHER *CHILLS OR SWEATING *NAUSEA AND VOMITING THAT IS NOT CONTROLLED WITH YOUR NAUSEA MEDICATION *UNUSUAL SHORTNESS OF BREATH *UNUSUAL BRUISING OR BLEEDING *URINARY PROBLEMS (pain or burning when urinating, or frequent urination) *BOWEL PROBLEMS (unusual diarrhea, constipation, pain near the anus) TENDERNESS IN MOUTH AND THROAT WITH OR WITHOUT PRESENCE OF ULCERS (sore throat, sores in mouth, or a toothache) UNUSUAL RASH, SWELLING OR PAIN  UNUSUAL VAGINAL DISCHARGE OR ITCHING   Items with * indicate a potential emergency and should be followed up as soon as possible or go to the Emergency  Department if any problems should occur.  Please show the CHEMOTHERAPY ALERT CARD or IMMUNOTHERAPY ALERT CARD at check-in to the Emergency Department and triage nurse.  Should you have questions after your visit or need to cancel or reschedule your appointment, please contact [facility] [place] - A DEPT OF Eligha Bridegroom [facility] [phone]  and follow the prompts.  Office hours are 8:00 a.m. to 4:30 p.m. Monday - Friday. Please note that voicemails left after 4:00 p.m. may not be returned until the following business day.  We are closed weekends and major holidays. You have access to a nurse at all times for urgent questions. Please call the main number to the clinic [phone] and follow the prompts.  For any non-urgent questions, you may also contact your provider using MyChart. We now offer e-Visits for anyone 88 and older to request care online for non-urgent symptoms. For details visit [email or web address].   Also download the MyChart app! Go to the app store, search "MyChart", open the app, select [place], and log in with your MyChart username and password.

## 2023-09-24 NOTE — Progress Notes (Signed)
   09/24/23 1500  Spiritual Encounters  Type of Visit Initial  Care provided to: Patient  Conversation partners present during encounter Nurse  Referral source Patient request  Reason for visit Routine spiritual support  OnCall Visit No  Spiritual Framework  Presenting Themes Meaning/purpose/sources of inspiration;Values and beliefs;Significant life change;Coping tools;Impactful experiences and emotions;Rituals and practive;Community and relationships  Community/Connection Family;Spiritual leader;Faith community  Patient Stress Factors None identified  Interventions  Spiritual Care Interventions Made Established relationship of care and support;Reflective listening;Narrative/life review;Explored values/beliefs/practices/strengths;Meaning making;Encouragement  Intervention Outcomes  Outcomes Connection to spiritual care;Awareness around self/spiritual resourses  Spiritual Care Plan  Spiritual Care Issues Still Outstanding No further spiritual care needs at this time (see row info)   Reason for Visit: Chaplain making rounds on the floor visiting infusion Pts   Description of Visit: Arriving in the room I found Chad Lamb in a recliner chair receiving treatment and no support person present.  I introduced myself as the new chaplain for the cancer center and offered a brief education on the role of a chaplain and the support we can offer to our patients, caregivers, and staff.  I began a conversation with them, and he was receptive to talking with me.   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   I facilitated life-review and storytelling and Chad Lamb shared his faith journey beginning as a young person in the [employer] to his conversion to Catholicism and ultimately becoming a secular [place].  He takes great pride in his journey.   Chad Lamb finds great comfort and strength in his faith  and finds purpose in being a light and a witness to others he comes in contact with during his treatments.   Though I find that Chad Lamb is well supported and has the resources that he needs, I also feel that Chad Lamb connecting with other spiritual leaders is important to him, thus I will continue to follow him in his journey.   Plan of Care: Chad Lamb will be here the rest of this week and then have a 6-week break.  I will schedule to call him in about 3 weeks and then follow-up at next in-house.   Maude Roll, MDiv   Chaplain, [facility]  [email or web address] [phone]

## 2023-09-24 NOTE — Progress Notes (Signed)
 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 Vital signs and labs within parameters for treatment. Pre-treatment flowsheet complete.   Treatment given today per MD orders. Tolerated infusion without adverse affects. Vital signs stable. No complaints at this time. Discharged from clinic ambulatory in stable condition. Alert and oriented x 3. F/U with [facility] as scheduled.

## 2023-09-25 ENCOUNTER — Encounter: Payer: Self-pay | Admitting: Hematology

## 2023-09-25 ENCOUNTER — Inpatient Hospital Stay

## 2023-09-25 VITALS — BP 127/83 | HR 59 | Temp 97.7°F | Resp 18

## 2023-09-25 DIAGNOSIS — Z5111 Encounter for antineoplastic chemotherapy: Secondary | ICD-10-CM | POA: Diagnosis not present

## 2023-09-25 DIAGNOSIS — D469 Myelodysplastic syndrome, unspecified: Secondary | ICD-10-CM

## 2023-09-25 DIAGNOSIS — Z95828 Presence of other vascular implants and grafts: Secondary | ICD-10-CM

## 2023-09-25 MED ORDER — SODIUM CHLORIDE 0.9% FLUSH
10.0000 mL | INTRAVENOUS | Status: DC | PRN
  Administered 2023-09-25: 10 mL

## 2023-09-25 MED ORDER — PROCHLORPERAZINE MALEATE 10 MG PO TABS
10.0000 mg | ORAL_TABLET | Freq: Once | ORAL | Status: AC
  Administered 2023-09-25: 10 mg via ORAL
  Filled 2023-09-25: qty 1

## 2023-09-25 MED ORDER — SODIUM CHLORIDE 0.9 % IV SOLN: Freq: Once | INTRAVENOUS | Status: AC

## 2023-09-25 MED ORDER — DEXAMETHASONE SODIUM PHOSPHATE 10 MG/ML IJ SOLN
10.0000 mg | Freq: Once | INTRAMUSCULAR | Status: AC
  Administered 2023-09-25: 10 mg via INTRAVENOUS
  Filled 2023-09-25: qty 1

## 2023-09-25 MED ORDER — HEPARIN SOD (PORK) LOCK FLUSH 100 UNIT/ML IV SOLN
500.0000 [IU] | Freq: Once | INTRAVENOUS | Status: AC | PRN
  Administered 2023-09-25: 500 [IU]

## 2023-09-25 MED ORDER — SODIUM CHLORIDE 0.9 % IV SOLN
75.0000 mg/m2 | Freq: Once | INTRAVENOUS | Status: AC
  Administered 2023-09-25: 140 mg via INTRAVENOUS
  Filled 2023-09-25: qty 14

## 2023-09-25 NOTE — Progress Notes (Signed)
 Patient presents today for D4 Vidaza  infusion.  Patient is in satisfactory condition with no new complaints voiced.  Vital signs are stable.  Labs reviewed on 09/22/2023 and all labs are within treatment parameters.  We will proceed with treatment per MD orders.    Treatment given today per MD orders. Tolerated infusion without adverse affects. Vital signs stable. No complaints at this time. Discharged from clinic ambulatory in stable condition. Alert and oriented x 3. F/U with [facility] as scheduled.

## 2023-09-25 NOTE — Patient Instructions (Signed)
 [facility] [place] [facility]  Discharge Instructions: Thank you for choosing [facility] to provide your oncology and hematology care.  If you have a lab appointment with the Cancer Center - please note that after April 8th, 2024, all labs will be drawn in the cancer center.  You do not have to check in or register with the main entrance as you have in the past but will complete your check-in in the cancer center.  Wear comfortable clothing and clothing appropriate for easy access to any Portacath or PICC line.   We strive to give you quality time with your provider. You may need to reschedule your appointment if you arrive late (15 or more minutes).  Arriving late affects you and other patients whose appointments are after yours.  Also, if you miss three or more appointments without notifying the office, you may be dismissed from the clinic at the provider's discretion.      For prescription refill requests, have your pharmacy contact our office and allow 72 hours for refills to be completed.    Today you received the following chemotherapy and/or immunotherapy agents D4 Vidaza   To help prevent nausea and vomiting after your treatment, we encourage you to take your nausea medication as directed.  Azacitidine Injection What is this medication? AZACITIDINE (ay za SITE i deen) treats blood and bone marrow cancers. It works by slowing down the growth of cancer cells. This medicine may be used for other purposes; ask your health care provider or pharmacist if you have questions. COMMON BRAND NAME(S): Vidaza What should I tell my care team before I take this medication? They need to know if you have any of these conditions: Kidney disease Liver disease Low blood cell levels, such as low white cells, platelets, or red blood cells Low levels of albumin in the blood Low levels of bicarbonate in the blood An unusual or allergic reaction to  azacitidine, mannitol, other medications, foods, dyes, or preservatives If you or your partner are pregnant or trying to get pregnant Breast-feeding How should I use this medication? This medication is injected into a vein or under the skin. It is given by your care team in a hospital or clinic setting. Talk to your care team about the use of this medication in children. While it may be prescribed for children as young as 1 month for selected conditions, precautions do apply. Overdosage: If you think you have taken too much of this medicine contact a poison control center or emergency room at once. NOTE: This medicine is only for you. Do not share this medicine with others. What if I miss a dose? Keep appointments for follow-up doses. It is important not to miss your dose. Call your care team if you are unable to keep an appointment. What may interact with this medication? Interactions are not expected. This list may not describe all possible interactions. Give your health care provider a list of all the medicines, herbs, non-prescription drugs, or dietary supplements you use. Also tell them if you smoke, drink alcohol, or use illegal drugs. Some items may interact with your medicine. What should I watch for while using this medication? Your condition will be monitored carefully while you are receiving this medication. This medication may make you feel generally unwell. This is not uncommon as chemotherapy can affect healthy cells as well as cancer cells. Report any side effects. Continue your course of treatment  even though you feel ill unless your care team tells you to stop. You may need blood work done while you are taking this medication. Other product types may be available that contain the medication azacitidine. The injection and oral products should not be used in place of one another. Talk to your care team if you have questions. This medication can cause serious side effects. To reduce  the risk, your care team may give you other medications to take before receiving this one. Be sure to follow the directions from your care team. This medication may increase your risk of getting an infection. Call your care team for advice if you get a fever, chills, sore throat, or other symptoms of a cold or flu. Do not treat yourself. Try to avoid being around people who are sick. Avoid taking medications that contain aspirin, acetaminophen, ibuprofen, naproxen, or ketoprofen unless instructed by your care team. These medications may hide a fever. Be careful brushing or flossing your teeth or using a toothpick because you may get an infection or bleed more easily. If you have any dental work done, tell your dentist you are receiving this medication. Talk to your care team if you or your partner may be pregnant. Serious birth defects can occur if you take this medication during pregnancy and for 6 months after the last dose. You will need a negative pregnancy test before starting this medication. Contraception is recommended while taking this medication and for 6 months after the last dose. Your care team can help you find the option that works for you. If your partner can get pregnant, use a condom during sex while taking this medication and for 3 months after the last dose. Do not breastfeed while taking this medication and for 1 week after the last dose. This medication may cause infertility. Talk to your care team if you are concerned about your fertility. What side effects may I notice from receiving this medication? Side effects that you should report to your care team as soon as possible: Allergic reactions--skin rash, itching, hives, swelling of the face, lips, tongue, or throat Infection--fever, chills, cough, sore throat, wounds that don't heal, pain or trouble when passing urine, general feeling of discomfort or being unwell Kidney injury--decrease in the amount of urine, swelling of the  ankles, hands, or feet Liver injury--right upper belly pain, loss of appetite, nausea, light-colored stool, dark yellow or brown urine, yellowing skin or eyes, unusual weakness or fatigue Low red blood cell level--unusual weakness or fatigue, dizziness, headache, trouble breathing Tumor lysis syndrome (TLS)--nausea, vomiting, diarrhea, decrease in the amount of urine, dark urine, unusual weakness or fatigue, confusion, muscle pain or cramps, fast or irregular heartbeat, joint pain Unusual bruising or bleeding Side effects that usually do not require medical attention (report to your care team if they continue or are bothersome): Constipation Diarrhea Nausea Pain, redness, or irritation at injection site Vomiting This list may not describe all possible side effects. Call your doctor for medical advice about side effects. You may report side effects to FDA at 1-800-FDA-1088. Where should I keep my medication? This medication is given in a hospital or clinic. It will not be stored at home. NOTE: This sheet is a summary. It may not cover all possible information. If you have questions about this medicine, talk to your doctor, pharmacist, or health care provider.  2024 Elsevier/Gold Standard (2022-11-04 00:00:00)  BELOW ARE SYMPTOMS THAT SHOULD BE REPORTED IMMEDIATELY: *FEVER GREATER THAN 100.4 F (38 C)  OR HIGHER *CHILLS OR SWEATING *NAUSEA AND VOMITING THAT IS NOT CONTROLLED WITH YOUR NAUSEA MEDICATION *UNUSUAL SHORTNESS OF BREATH *UNUSUAL BRUISING OR BLEEDING *URINARY PROBLEMS (pain or burning when urinating, or frequent urination) *BOWEL PROBLEMS (unusual diarrhea, constipation, pain near the anus) TENDERNESS IN MOUTH AND THROAT WITH OR WITHOUT PRESENCE OF ULCERS (sore throat, sores in mouth, or a toothache) UNUSUAL RASH, SWELLING OR PAIN  UNUSUAL VAGINAL DISCHARGE OR ITCHING   Items with * indicate a potential emergency and should be followed up as soon as possible or go to the  Emergency Department if any problems should occur.  Please show the CHEMOTHERAPY ALERT CARD or IMMUNOTHERAPY ALERT CARD at check-in to the Emergency Department and triage nurse.  Should you have questions after your visit or need to cancel or reschedule your appointment, please contact [facility] [place] - A DEPT OF Eligha Bridegroom [facility] [phone]  and follow the prompts.  Office hours are 8:00 a.m. to 4:30 p.m. Monday - Friday. Please note that voicemails left after 4:00 p.m. may not be returned until the following business day.  We are closed weekends and major holidays. You have access to a nurse at all times for urgent questions. Please call the main number to the clinic [phone] and follow the prompts.  For any non-urgent questions, you may also contact your provider using MyChart. We now offer e-Visits for anyone 50 and older to request care online for non-urgent symptoms. For details visit [email or web address].   Also download the MyChart app! Go to the app store, search "MyChart", open the app, select [place], and log in with your MyChart username and password.

## 2023-09-26 ENCOUNTER — Inpatient Hospital Stay

## 2023-09-26 VITALS — BP 131/84 | HR 65 | Temp 97.4°F | Resp 18

## 2023-09-26 DIAGNOSIS — Z5111 Encounter for antineoplastic chemotherapy: Secondary | ICD-10-CM | POA: Diagnosis not present

## 2023-09-26 DIAGNOSIS — Z95828 Presence of other vascular implants and grafts: Secondary | ICD-10-CM

## 2023-09-26 DIAGNOSIS — D469 Myelodysplastic syndrome, unspecified: Secondary | ICD-10-CM

## 2023-09-26 MED ORDER — SODIUM CHLORIDE 0.9 % IV SOLN
75.0000 mg/m2 | Freq: Once | INTRAVENOUS | Status: AC
  Administered 2023-09-26: 140 mg via INTRAVENOUS
  Filled 2023-09-26: qty 14

## 2023-09-26 MED ORDER — SODIUM CHLORIDE 0.9% FLUSH
10.0000 mL | INTRAVENOUS | Status: DC | PRN
  Administered 2023-09-26: 10 mL

## 2023-09-26 MED ORDER — HEPARIN SOD (PORK) LOCK FLUSH 100 UNIT/ML IV SOLN
500.0000 [IU] | Freq: Once | INTRAVENOUS | Status: AC | PRN
  Administered 2023-09-26: 500 [IU]

## 2023-09-26 MED ORDER — DEXAMETHASONE SODIUM PHOSPHATE 10 MG/ML IJ SOLN
10.0000 mg | Freq: Once | INTRAMUSCULAR | Status: AC
  Administered 2023-09-26: 10 mg via INTRAVENOUS
  Filled 2023-09-26: qty 1

## 2023-09-26 MED ORDER — PALONOSETRON HCL INJECTION 0.25 MG/5ML
0.2500 mg | Freq: Once | INTRAVENOUS | Status: AC
  Administered 2023-09-26: 0.25 mg via INTRAVENOUS
  Filled 2023-09-26: qty 5

## 2023-09-26 MED ORDER — SODIUM CHLORIDE 0.9 % IV SOLN: Freq: Once | INTRAVENOUS | Status: AC

## 2023-09-26 NOTE — Progress Notes (Signed)
 Patient tolerated chemotherapy with no complaints voiced.  Side effects with management reviewed with understanding verbalized.  Port site clean and dry with no bruising or swelling noted at site.  Good blood return noted before and after administration of chemotherapy.  Band aid applied.  Patient left in satisfactory condition with VSS and no s/s of distress noted. All follow ups as scheduled.   Chad Lamb [employer]

## 2023-09-26 NOTE — Patient Instructions (Signed)
 [facility] [place] [facility]  Discharge Instructions: Thank you for choosing [facility] to provide your oncology and hematology care.  If you have a lab appointment with the Cancer Center - please note that after April 8th, 2024, all labs will be drawn in the cancer center.  You do not have to check in or register with the main entrance as you have in the past but will complete your check-in in the cancer center.  Wear comfortable clothing and clothing appropriate for easy access to any Portacath or PICC line.   We strive to give you quality time with your provider. You may need to reschedule your appointment if you arrive late (15 or more minutes).  Arriving late affects you and other patients whose appointments are after yours.  Also, if you miss three or more appointments without notifying the office, you may be dismissed from the clinic at the provider's discretion.      For prescription refill requests, have your pharmacy contact our office and allow 72 hours for refills to be completed.    Today you received the following chemotherapy and/or immunotherapy agents vidaza.       To help prevent nausea and vomiting after your treatment, we encourage you to take your nausea medication as directed.  BELOW ARE SYMPTOMS THAT SHOULD BE REPORTED IMMEDIATELY: *FEVER GREATER THAN 100.4 F (38 C) OR HIGHER *CHILLS OR SWEATING *NAUSEA AND VOMITING THAT IS NOT CONTROLLED WITH YOUR NAUSEA MEDICATION *UNUSUAL SHORTNESS OF BREATH *UNUSUAL BRUISING OR BLEEDING *URINARY PROBLEMS (pain or burning when urinating, or frequent urination) *BOWEL PROBLEMS (unusual diarrhea, constipation, pain near the anus) TENDERNESS IN MOUTH AND THROAT WITH OR WITHOUT PRESENCE OF ULCERS (sore throat, sores in mouth, or a toothache) UNUSUAL RASH, SWELLING OR PAIN  UNUSUAL VAGINAL DISCHARGE OR ITCHING   Items with * indicate a potential emergency and should be followed up  as soon as possible or go to the Emergency Department if any problems should occur.  Please show the CHEMOTHERAPY ALERT CARD or IMMUNOTHERAPY ALERT CARD at check-in to the Emergency Department and triage nurse.  Should you have questions after your visit or need to cancel or reschedule your appointment, please contact [facility] [place] - A DEPT OF [facility]ideg[facility] [phone]  and follow the prompts.  Office hours are 8:00 a.m. to 4:30 p.m. Monday - Friday. Please note that voicemails left after 4:00 p.m. may not be returned until the following business day.  We are closed weekends and major holidays. You have access to a nurse at all times for urgent questions. Please call the main number to the clinic [phone] and follow the prompts.  For any non-urgent questions, you may also contact your provider using MyChart. We now offer e-Visits for anyone 8 and older to request care online for non-urgent symptoms. For details visit [email or web address].   Also download the MyChart app! Go to the app store, search "MyChart", open the app, select [place], and log in with your MyChart username and password.

## 2023-10-21 IMAGING — CT CT BIOPSY
1 of 2 series · 15 of 24 positions shown, 19 images · non-contrast
Comparison: none

INDICATION: Thrombocytopenia and neutropenia.  Evaluate for MDS.

[Series 2: i-spiral 5.0 br40 · axial · 0.98mm/px · z∈[-118,-55]mm · 15 of 21 slices shown, 19 images]
[im 2/21  mediastinal]
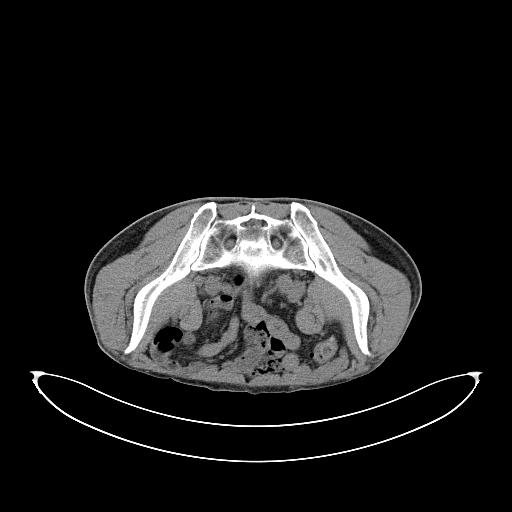
[im 2/21  lung]
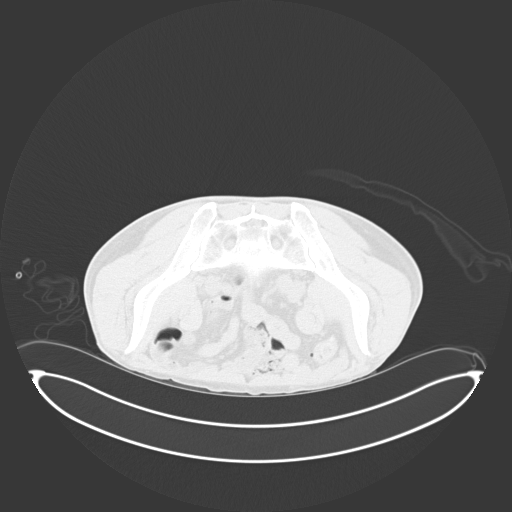
[im 3/21  lung]
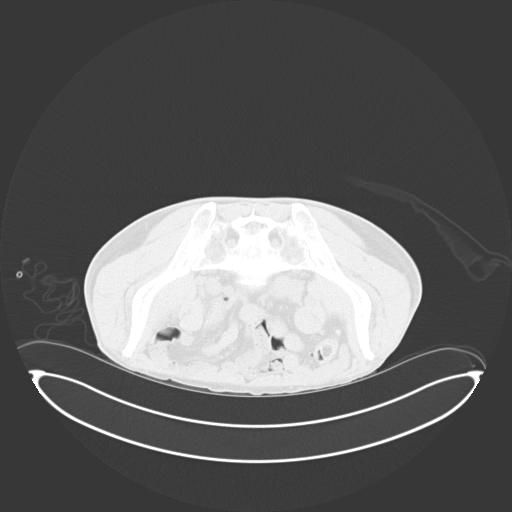
[im 5/21  lung]
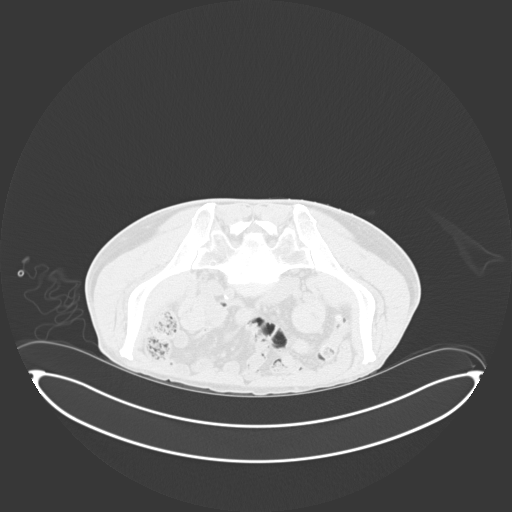
[im 6/21  lung]
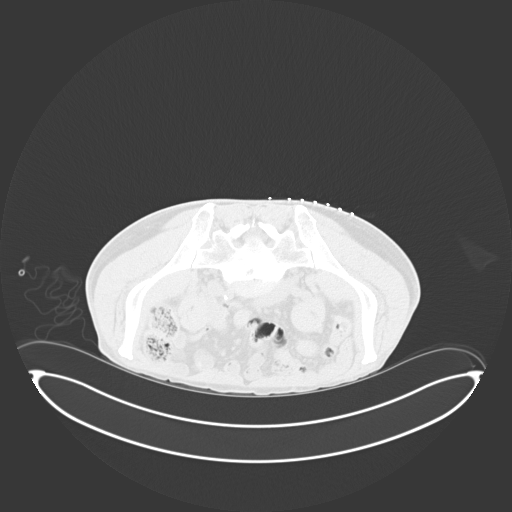
[im 7/21  mediastinal]
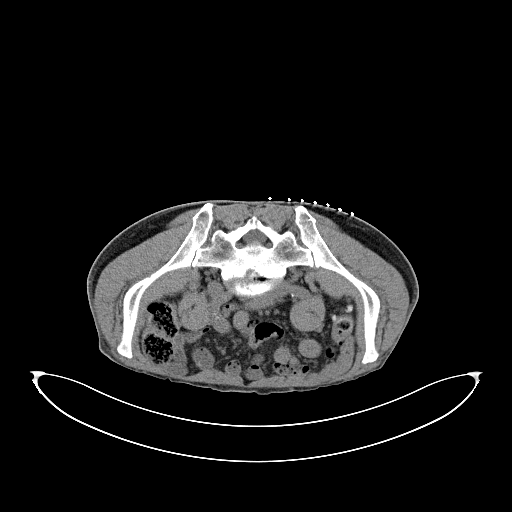
[im 7/21  lung]
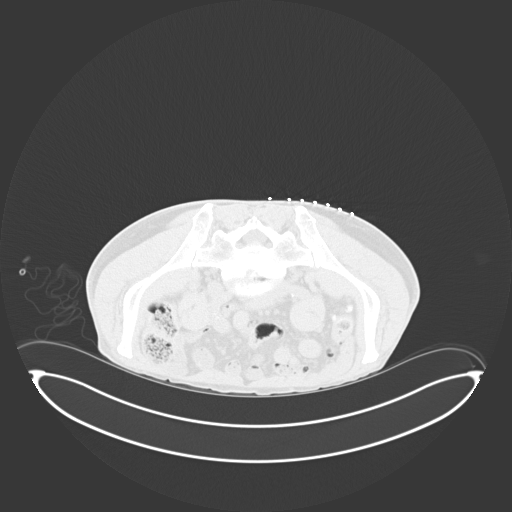
[im 8/21  lung]
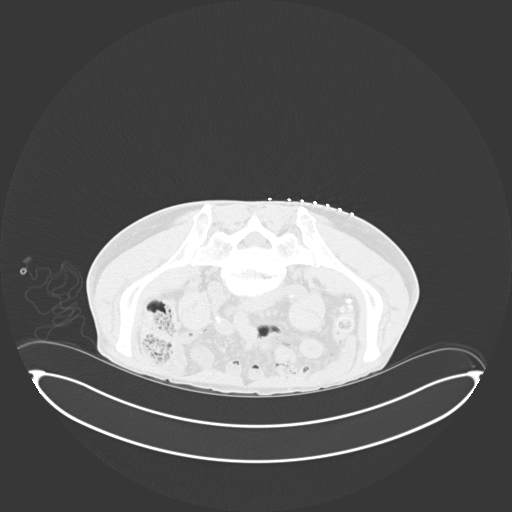
[im 10/21  lung]
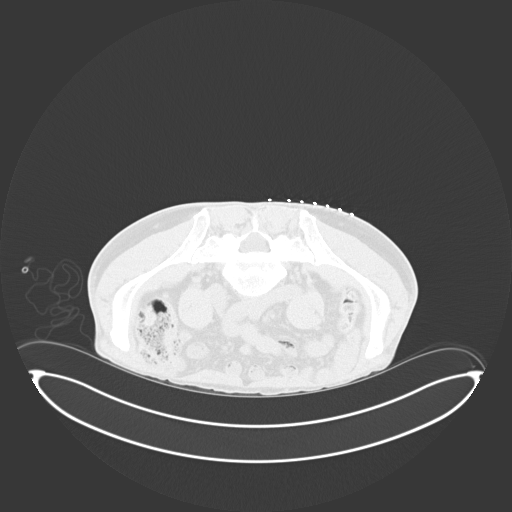
[im 11/21  lung]
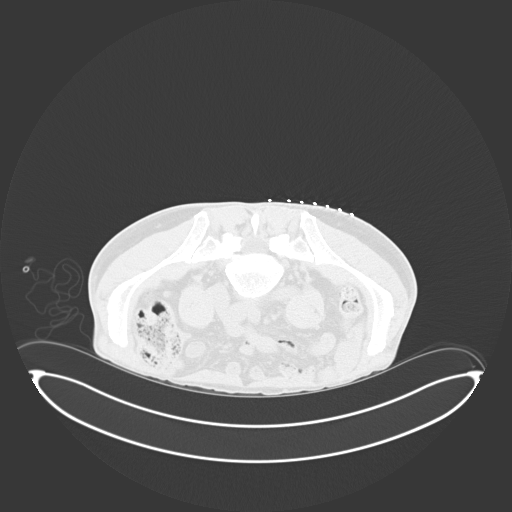
[im 12/21  mediastinal]
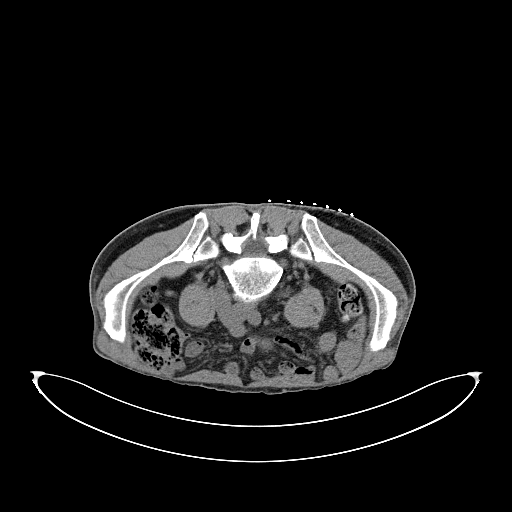
[im 12/21  lung]
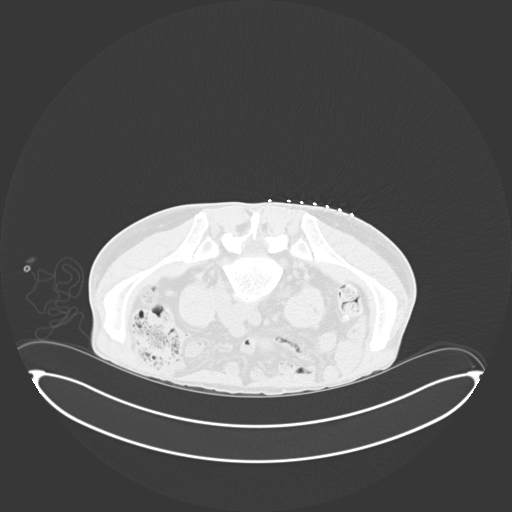
[im 14/21  lung]
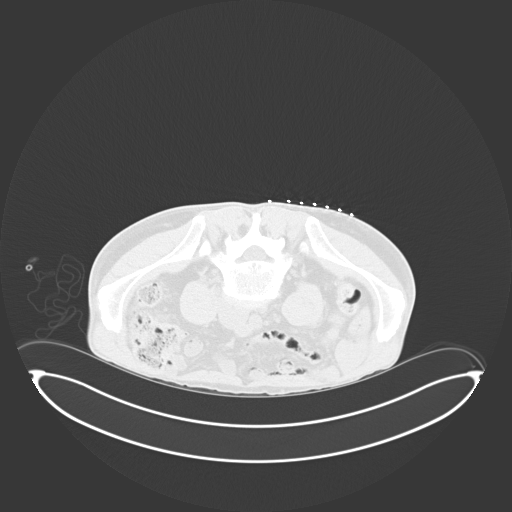
[im 15/21  lung]
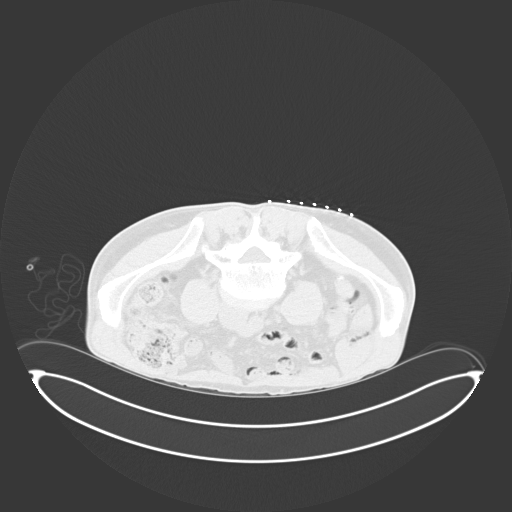
[im 16/21  lung]
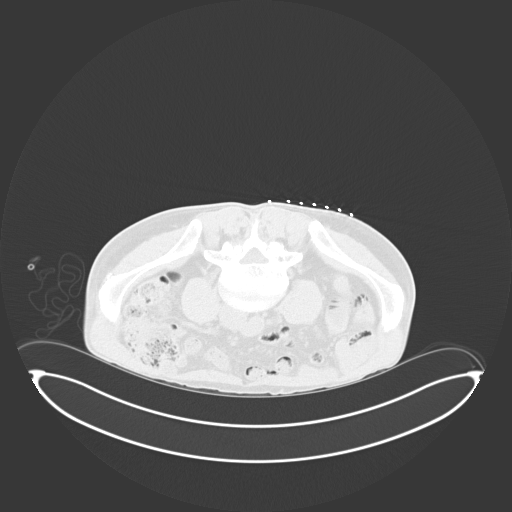
[im 17/21  mediastinal]
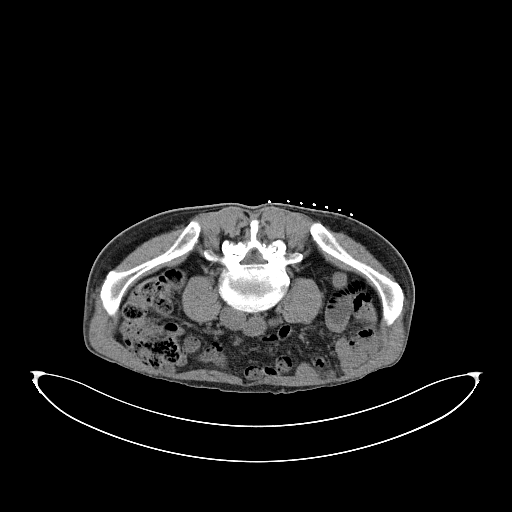
[im 17/21  lung]
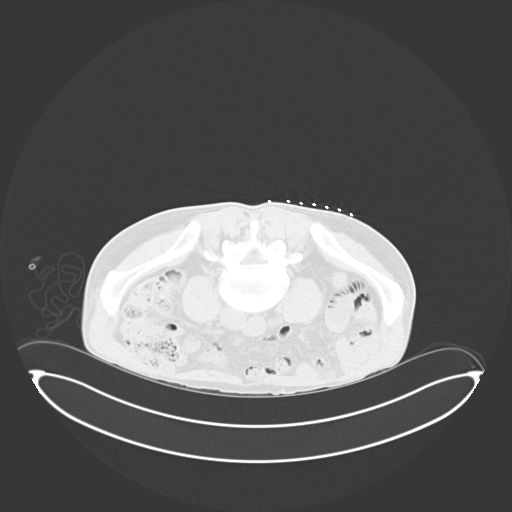
[im 19/21  lung]
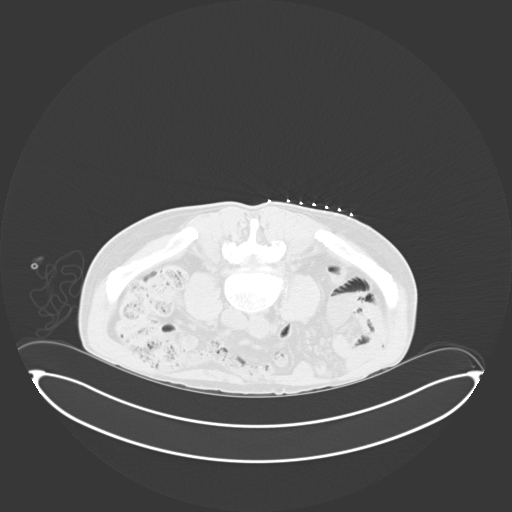
[im 20/21  lung]
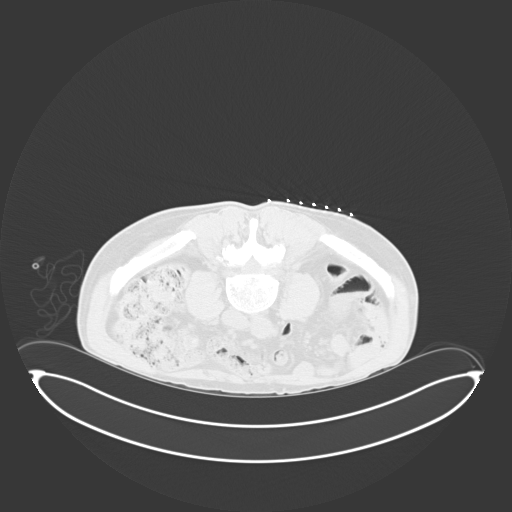

[15 of 24 positions shown; findings below may reference images not displayed]

EXAM:
CT-GUIDED BONE MARROW BIOPSY AND ASPIRATION

MEDICATIONS:
None

ANESTHESIA/SEDATION:
Moderate (conscious) sedation was employed during this procedure as
administered by the [REDACTED].

A total of Versed 2 mg and Fentanyl 100 mcg was administered
intravenously.

Moderate Sedation Time: 10 minutes. The patient's level of
consciousness and vital signs were monitored continuously by
radiology nursing throughout the procedure under my direct
supervision.

COMPLICATIONS:
None immediate.

PROCEDURE:
Informed consent was obtained from the patient following an
explanation of the procedure, risks, benefits and alternatives. The
patient understands, agrees and consents for the procedure. All
questions were addressed. A time out was performed prior to the
initiation of the procedure.

The patient was positioned prone and non-contrast localization CT
was performed of the pelvis to demonstrate the iliac marrow spaces.
The operative site was prepped and draped in the usual sterile
fashion.

Under sterile conditions and local anesthesia, a 22 gauge spinal
needle was utilized for procedural planning. Next, an 11 gauge
coaxial bone biopsy needle was advanced into the left iliac marrow
space. Needle position was confirmed with CT imaging. Initially, a
bone marrow aspiration was performed. Next, a bone marrow biopsy was
obtained with the 11 gauge outer bone marrow device. Samples were
prepared with the cytotechnologist and deemed adequate. The needle
was removed and superficial hemostasis was obtained with manual
compression. A dressing was applied. The patient tolerated the
procedure well without immediate post procedural complication.
IMPRESSION: Successful CT guided left iliac bone marrow aspiration and core
biopsy.

## 2023-10-21 IMAGING — CT CT BIOPSY AND ASPIRATION BONE MARROW
1 of 2 series · 15 of 24 positions shown, 19 images · non-contrast
Comparison: none

INDICATION: Thrombocytopenia and neutropenia.  Evaluate for MDS.

[Series 2: i-spiral 5.0 br40 · axial · 0.98mm/px · z∈[-118,-55]mm · 15 of 21 slices shown, 19 images]
[im 2/21  mediastinal]
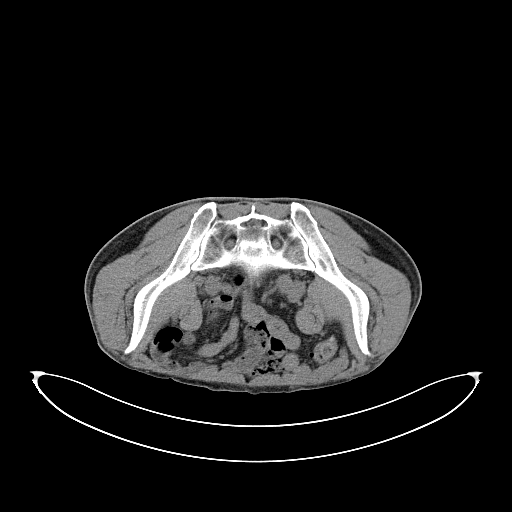
[im 2/21  lung]
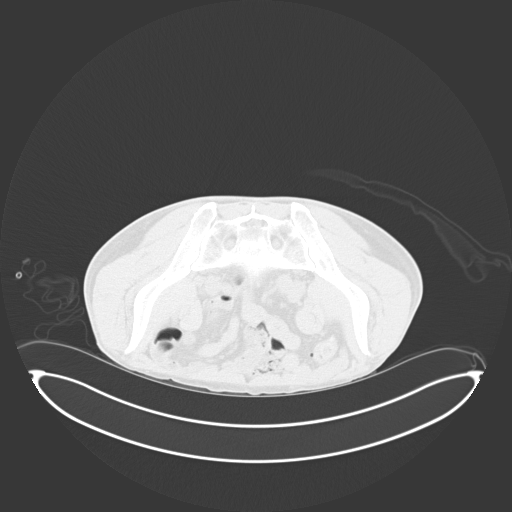
[im 3/21  lung]
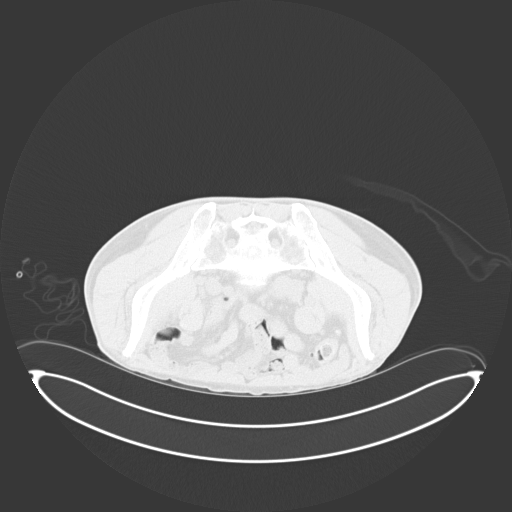
[im 5/21  lung]
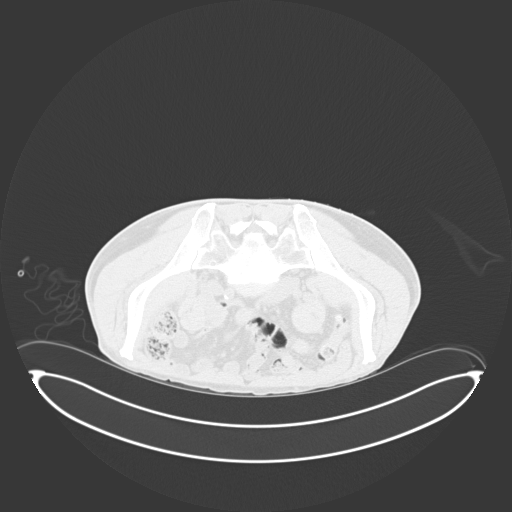
[im 6/21  lung]
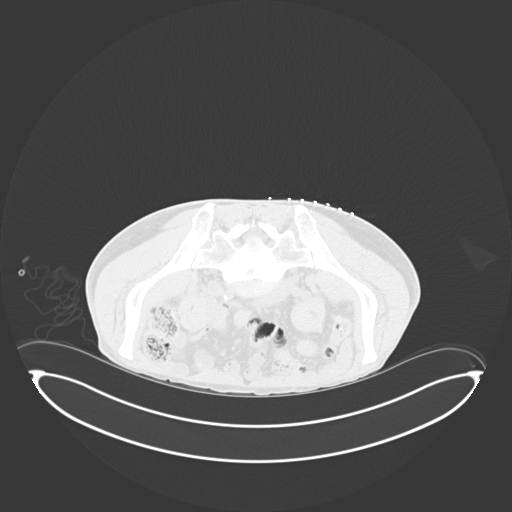
[im 7/21  mediastinal]
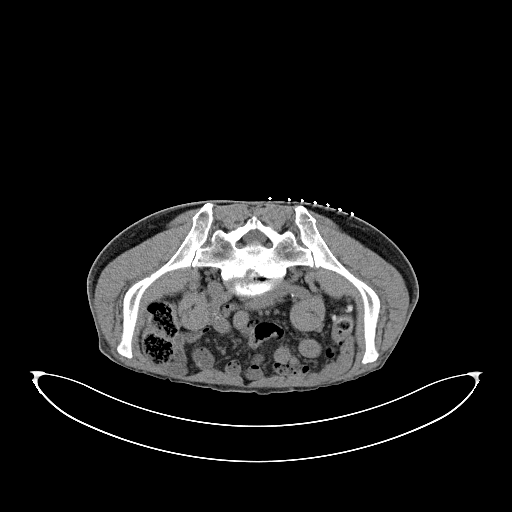
[im 7/21  lung]
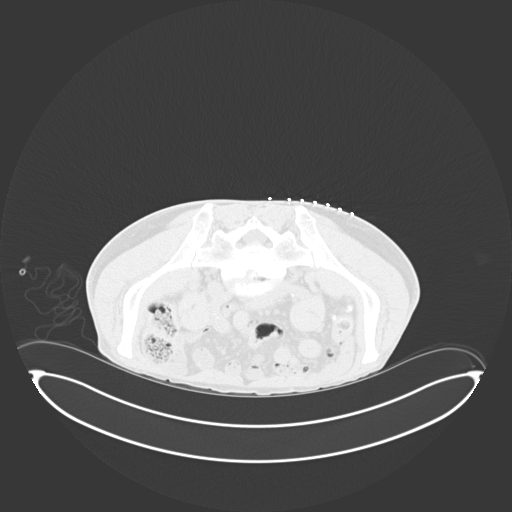
[im 8/21  lung]
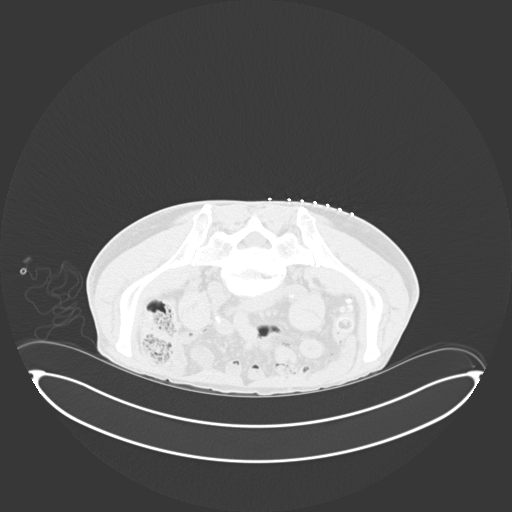
[im 10/21  lung]
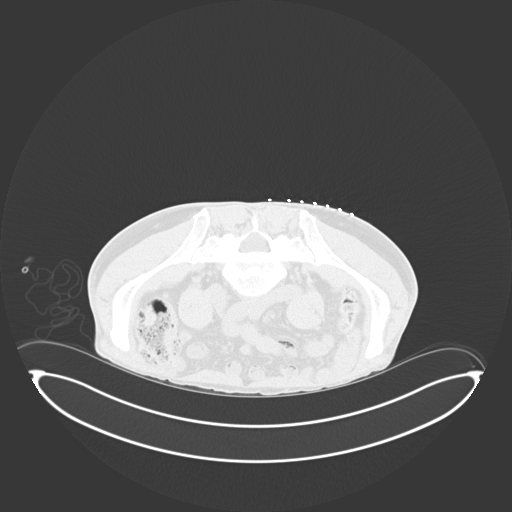
[im 11/21  lung]
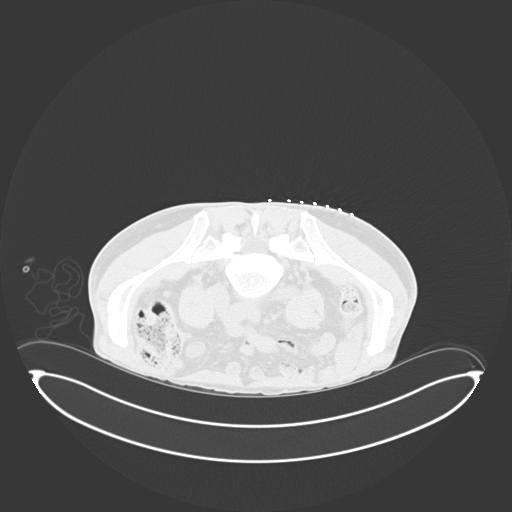
[im 12/21  mediastinal]
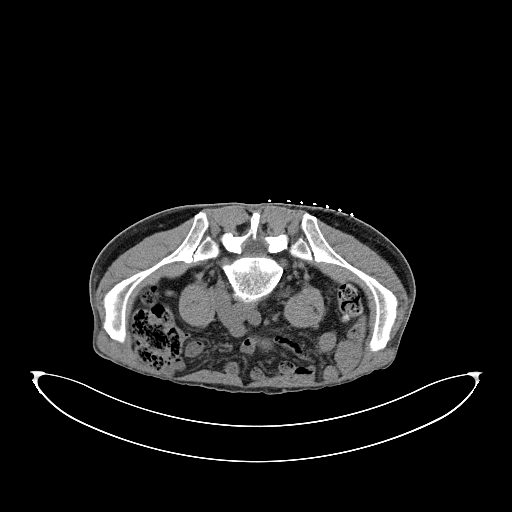
[im 12/21  lung]
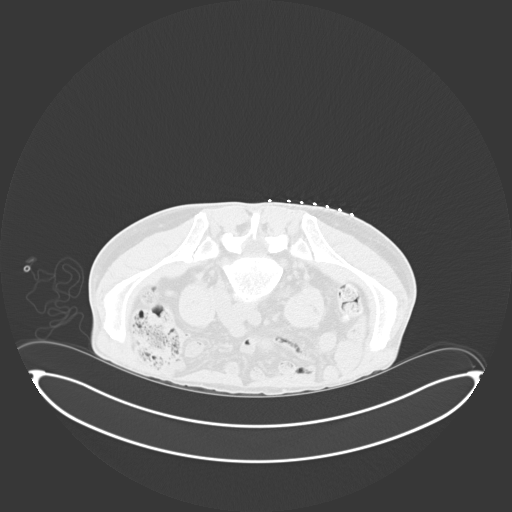
[im 14/21  lung]
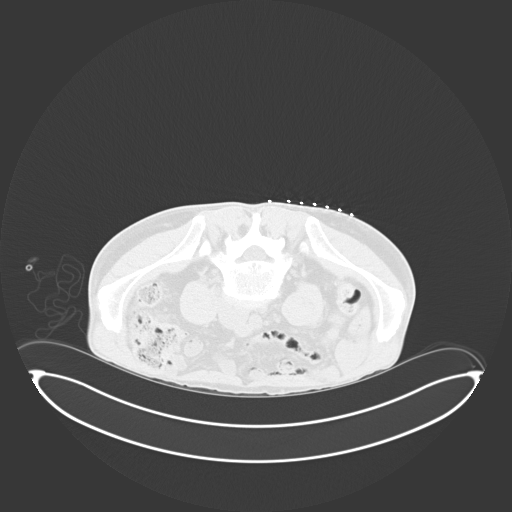
[im 15/21  lung]
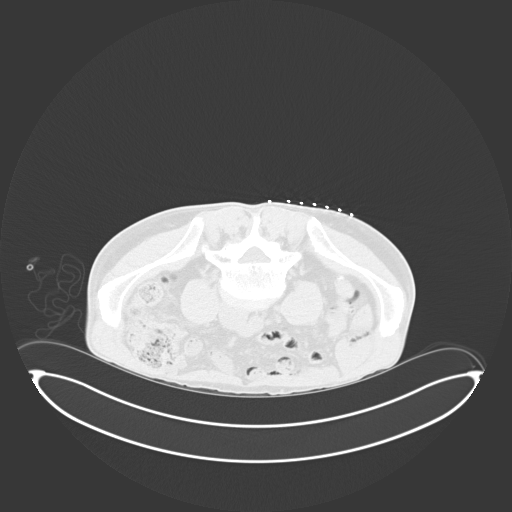
[im 16/21  lung]
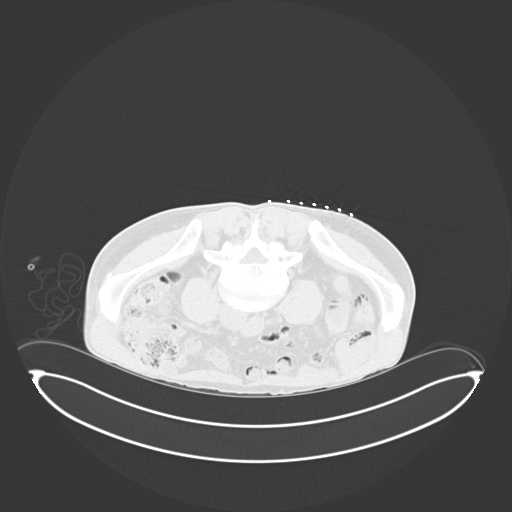
[im 17/21  mediastinal]
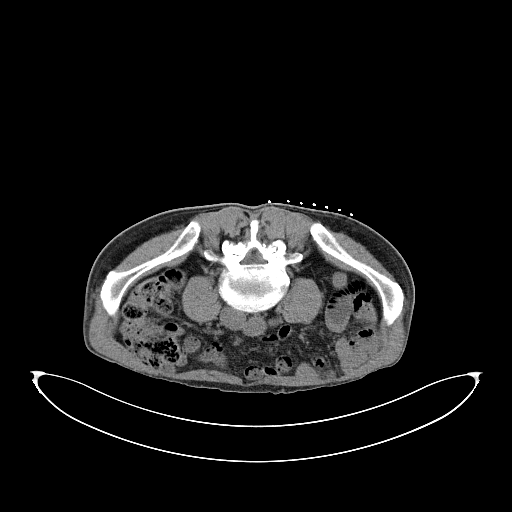
[im 17/21  lung]
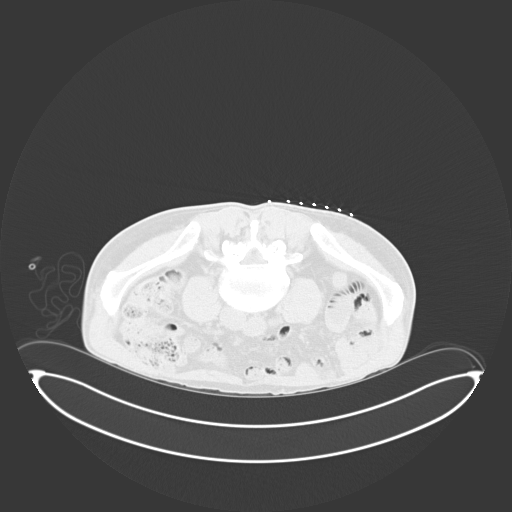
[im 19/21  lung]
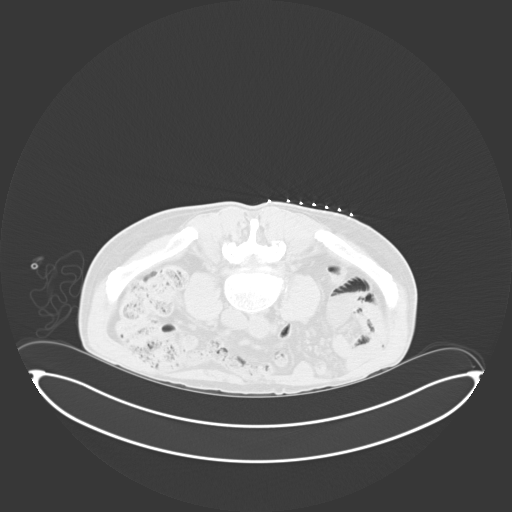
[im 20/21  lung]
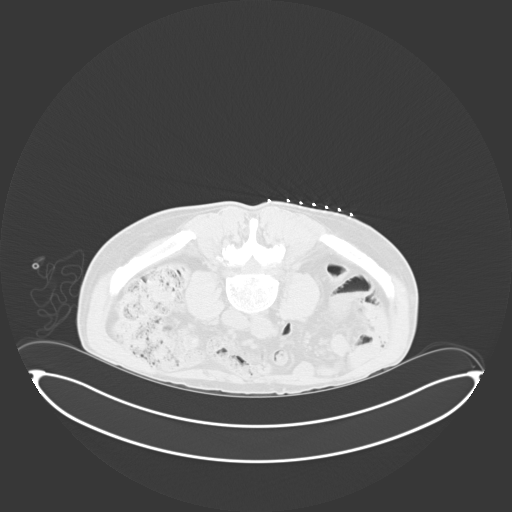

[15 of 24 positions shown; findings below may reference images not displayed]

EXAM:
CT-GUIDED BONE MARROW BIOPSY AND ASPIRATION

MEDICATIONS:
None

ANESTHESIA/SEDATION:
Moderate (conscious) sedation was employed during this procedure as
administered by the [REDACTED].

A total of Versed 2 mg and Fentanyl 100 mcg was administered
intravenously.

Moderate Sedation Time: 10 minutes. The patient's level of
consciousness and vital signs were monitored continuously by
radiology nursing throughout the procedure under my direct
supervision.

COMPLICATIONS:
None immediate.

PROCEDURE:
Informed consent was obtained from the patient following an
explanation of the procedure, risks, benefits and alternatives. The
patient understands, agrees and consents for the procedure. All
questions were addressed. A time out was performed prior to the
initiation of the procedure.

The patient was positioned prone and non-contrast localization CT
was performed of the pelvis to demonstrate the iliac marrow spaces.
The operative site was prepped and draped in the usual sterile
fashion.

Under sterile conditions and local anesthesia, a 22 gauge spinal
needle was utilized for procedural planning. Next, an 11 gauge
coaxial bone biopsy needle was advanced into the left iliac marrow
space. Needle position was confirmed with CT imaging. Initially, a
bone marrow aspiration was performed. Next, a bone marrow biopsy was
obtained with the 11 gauge outer bone marrow device. Samples were
prepared with the cytotechnologist and deemed adequate. The needle
was removed and superficial hemostasis was obtained with manual
compression. A dressing was applied. The patient tolerated the
procedure well without immediate post procedural complication.
IMPRESSION: Successful CT guided left iliac bone marrow aspiration and core
biopsy.

## 2023-10-22 ENCOUNTER — Other Ambulatory Visit: Payer: Self-pay

## 2023-10-22 ENCOUNTER — Other Ambulatory Visit: Payer: Self-pay | Admitting: Oncology

## 2023-10-22 ENCOUNTER — Other Ambulatory Visit (HOSPITAL_COMMUNITY): Payer: Self-pay

## 2023-10-22 ENCOUNTER — Other Ambulatory Visit: Payer: Self-pay | Admitting: Pharmacy Technician

## 2023-10-22 DIAGNOSIS — D469 Myelodysplastic syndrome, unspecified: Secondary | ICD-10-CM

## 2023-10-22 MED ORDER — VENETOCLAX 100 MG PO TABS
200.0000 mg | ORAL_TABLET | Freq: Every day | ORAL | 1 refills | Status: DC
  Filled 2023-10-22: qty 28, 42d supply, fill #0

## 2023-10-22 NOTE — Progress Notes (Signed)
 Specialty Pharmacy Refill Coordination Note  Chad Lamb is a 78 y.o. male contacted today regarding refills of specialty medication(s) Venetoclax  (VENCLEXTA )   Patient requested Delivery   Delivery date: 10/24/23   Verified address: [address] [place] [address]   Medication will be filled on 10/23/23.    RR sent to [facility] Patient has appt with MD on 10/23/23. Patient to start back on Venclexta  on 10/27/23.

## 2023-10-22 NOTE — Telephone Encounter (Signed)
 Refill for Venclexta  approved.  Patient is tolerating and is to continue therapy.

## 2023-10-23 ENCOUNTER — Other Ambulatory Visit (HOSPITAL_COMMUNITY): Payer: Self-pay

## 2023-10-23 ENCOUNTER — Other Ambulatory Visit: Payer: Self-pay

## 2023-10-23 ENCOUNTER — Inpatient Hospital Stay: Attending: Hematology

## 2023-10-23 ENCOUNTER — Inpatient Hospital Stay (HOSPITAL_BASED_OUTPATIENT_CLINIC_OR_DEPARTMENT_OTHER): Admitting: Oncology

## 2023-10-23 VITALS — BP 133/88 | HR 64 | Temp 97.9°F | Resp 18 | Wt 149.0 lb

## 2023-10-23 VITALS — BP 134/84 | HR 60 | Temp 98.5°F | Resp 16 | Wt 150.8 lb

## 2023-10-23 DIAGNOSIS — I82A12 Acute embolism and thrombosis of left axillary vein: Secondary | ICD-10-CM

## 2023-10-23 DIAGNOSIS — D469 Myelodysplastic syndrome, unspecified: Secondary | ICD-10-CM

## 2023-10-23 DIAGNOSIS — Z5189 Encounter for other specified aftercare: Secondary | ICD-10-CM | POA: Diagnosis not present

## 2023-10-23 DIAGNOSIS — K5900 Constipation, unspecified: Secondary | ICD-10-CM

## 2023-10-23 DIAGNOSIS — Z5111 Encounter for antineoplastic chemotherapy: Secondary | ICD-10-CM | POA: Insufficient documentation

## 2023-10-23 DIAGNOSIS — R21 Rash and other nonspecific skin eruption: Secondary | ICD-10-CM

## 2023-10-23 DIAGNOSIS — D4622 Refractory anemia with excess of blasts 2: Secondary | ICD-10-CM | POA: Insufficient documentation

## 2023-10-23 DIAGNOSIS — D709 Neutropenia, unspecified: Secondary | ICD-10-CM

## 2023-10-23 LAB — CBC WITH DIFFERENTIAL/PLATELET
Abs Immature Granulocytes: 0.01 K/uL (ref 0.00–0.07)
Basophils Absolute: 0.1 K/uL (ref 0.0–0.1)
Basophils Relative: 1 %
Eosinophils Absolute: 0.1 K/uL (ref 0.0–0.5)
Eosinophils Relative: 3 %
HCT: 37.7 % — ABNORMAL LOW (ref 39.0–52.0)
Hemoglobin: 12 g/dL — ABNORMAL LOW (ref 13.0–17.0)
Immature Granulocytes: 0 %
Lymphocytes Relative: 25 %
Lymphs Abs: 1 K/uL (ref 0.7–4.0)
MCH: 26.4 pg (ref 26.0–34.0)
MCHC: 31.8 g/dL (ref 30.0–36.0)
MCV: 83 fL (ref 80.0–100.0)
Monocytes Absolute: 0.8 K/uL (ref 0.1–1.0)
Monocytes Relative: 20 %
Neutro Abs: 2 K/uL (ref 1.7–7.7)
Neutrophils Relative %: 51 %
Platelets: 254 K/uL (ref 150–400)
RBC: 4.54 MIL/uL (ref 4.22–5.81)
RDW: 17.6 % — ABNORMAL HIGH (ref 11.5–15.5)
WBC: 3.9 K/uL — ABNORMAL LOW (ref 4.0–10.5)
nRBC: 0 % (ref 0.0–0.2)

## 2023-10-23 LAB — COMPREHENSIVE METABOLIC PANEL WITH GFR
ALT: 15 U/L (ref 0–44)
AST: 20 U/L (ref 15–41)
Albumin: 3.8 g/dL (ref 3.5–5.0)
Alkaline Phosphatase: 65 U/L (ref 38–126)
Anion gap: 11 (ref 5–15)
BUN: 15 mg/dL (ref 8–23)
CO2: 24 mmol/L (ref 22–32)
Calcium: 9.7 mg/dL (ref 8.9–10.3)
Chloride: 105 mmol/L (ref 98–111)
Creatinine, Ser: 0.92 mg/dL (ref 0.61–1.24)
GFR, Estimated: >60 mL/min (ref >60)
Glucose, Bld: 98 mg/dL (ref 70–99)
Potassium: 4.1 mmol/L (ref 3.5–5.1)
Sodium: 140 mmol/L (ref 135–145)
Total Bilirubin: 1.4 mg/dL — ABNORMAL HIGH (ref 0.0–1.2)
Total Protein: 6 g/dL — ABNORMAL LOW (ref 6.5–8.1)

## 2023-10-23 LAB — MAGNESIUM: Magnesium: 2.2 mg/dL (ref 1.7–2.4)

## 2023-10-23 MED ORDER — VENETOCLAX 100 MG PO TABS
200.0000 mg | ORAL_TABLET | Freq: Every day | ORAL | 1 refills | Status: DC
  Filled 2023-10-23: qty 28, 14d supply, fill #0
  Filled 2023-12-01: qty 28, 42d supply, fill #0
  Filled 2024-01-09: qty 28, 42d supply, fill #1

## 2023-10-23 NOTE — Progress Notes (Signed)
 Patient tolerating Venclexta  without issues

## 2023-10-23 NOTE — Progress Notes (Signed)
 Chad Lamb presented for Portacath access and flush with labs. Portacath located right chest wall accessed with  H 20 needle.  Good blood return present. Portacath flushed with 20ml NS needle removed intact. No bruising or swelling noted at the site. Procedure tolerated well and without incident.

## 2023-10-24 ENCOUNTER — Other Ambulatory Visit: Payer: Self-pay

## 2023-10-26 ENCOUNTER — Encounter: Payer: Self-pay | Admitting: Hematology

## 2023-10-26 DIAGNOSIS — R21 Rash and other nonspecific skin eruption: Secondary | ICD-10-CM | POA: Insufficient documentation

## 2023-10-26 DIAGNOSIS — K5900 Constipation, unspecified: Secondary | ICD-10-CM | POA: Insufficient documentation

## 2023-10-26 NOTE — Assessment & Plan Note (Addendum)
 Likely secondary to venetoclax  or progression of MDS or treatment failure  - Will continue to monitor counts at this time - Will consider bone marrow biopsy if it continues to worsen.

## 2023-10-26 NOTE — Assessment & Plan Note (Addendum)
 Patient had an unprovoked clot previously Was on a prolonged period of Xarelto  but was discontinued for hemorrhoidal bleeding  - No evidence of recurrent clot

## 2023-10-26 NOTE — Progress Notes (Signed)
 Patient Care Team: [name], MD as PCP - General (Family Medicine) [name], MD (Family Medicine) Harvey Margo CROME, MD (Inactive) as Consulting Physician (Gastroenterology) Davonna Siad, MD as Consulting Physician (Oncology)  Clinic Day:  10/26/2023  Referring physician: [name], MD   CHIEF COMPLAINT:  CC: MDS  Chad Lamb 78 y.o. male was transferred to my care after his prior physician has left.   ASSESSMENT & PLAN:   Assessment & Plan: Chad Lamb  is a 78 y.o. 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 Had good response with azacitidine  and venetoclax  Currently on azacitidine  days 1-5 and venetoclax  200 mg days 1-14 of a 42-day cycle Last bone marrow biopsy was from March 2024 with very good response  - Labs reviewed today, has some leukopenia and anemia.  Neck cycle due on 10/27/2023. Normal LFTs and creatinine. - Will consider bone marrow biopsy if there is significant change in the counts -Proceed with azacitidine  on Monday.  Continue venetoclax  200 mg days 1-14 of a 42-day cycle.  Return to clinic in 7 weeks before next cycle Neutropenia, unspecified type (HCC) Likely secondary to venetoclax  or progression of MDS or treatment failure  - Will continue to monitor counts at this time - Will consider bone marrow biopsy if it continues to worsen. Rash Patient has erythematous not papular rash on arms, hand, suprapubic area. Evaluated by dermatologist and was nonmalignant  - Improved with hydrocortisone  cream - Continue the hydrocortisone  cream twice a day Constipation, unspecified constipation type Patient reports constipation during cycles.  - Continue Colace as prescribed and MiraLAX as needed Acute deep vein thrombosis (DVT) of axillary vein of left upper extremity (HCC) Patient had an unprovoked clot previously Was on a prolonged period of  Xarelto  but was discontinued for hemorrhoidal bleeding  - No evidence of recurrent clot    The patient understands the plans discussed today and is in agreement with them.  He knows to contact our office if he develops concerns prior to his next appointment.  50 minutes of total time was spent for this patient encounter, including preparation, face-to-face counseling with the patient and coordination of care, physical exam, and documentation of the encounter. > 50% of the time was spent on counseling as documented under my assessment and plan.    Siad Davonna, MD  [facility] [place] - A DEPT OF [facility] [address] [place] [address] Dept: [phone] Dept Fax: [phone]   Orders Placed This Encounter  Procedures   Lactate dehydrogenase    Standing Status:   Future    Expected Date:   12/15/2023    Expiration Date:   03/14/2024   Uric acid    Standing Status:   Future    Expected Date:   12/15/2023    Expiration Date:   03/14/2024   Ferritin    Standing Status:   Future    Expected Date:   12/15/2023    Expiration Date:   03/14/2024   Folate    Standing Status:   Future    Expected Date:   12/15/2023    Expiration Date:   03/14/2024   Vitamin B12    Standing Status:   Future    Expected Date:   12/15/2023    Expiration Date:   03/14/2024   Iron and TIBC  Standing Status:   Future    Expected Date:   12/15/2023    Expiration Date:   03/14/2024     ONCOLOGY HISTORY:   I have reviewed his chart and materials related to his cancer extensively and collaborated history with the patient. Summary of oncologic history is as follows:  Myelodysplastic syndrome- EB2:  -01/05/2021: Presented with worsening leukopenia and thrombocytopenia - 06/04/2021: Flow cytometry: Increased metaplastic cells (10%).  Hypercellular bone marrow with MDS with excess blasts (MDS-EB-2).  Cellularity 50 to 70%.  Absent ring  sideroblasts.  MDS FISH: Normal.  Normal male karyotype. IPSS-M score: 0.22- moderate high risk. - 06/18/2021: Mutations detected: ASXL1 - 07/30/2021-04/02/2022: Azacitidine  (days 1-7) every 28 days x 6 cycles - 01/04/2022: Flow cytometry: 3.3% CD4 positive myeloblasts.  Bone marrow consistent with persistent MDS.  Cellularity 40%.  Consistent with MDS with increased blasts. Absent ring sideroblasts normal MDS FISH.  Normal male karyotype. - 02/18/2022: Added Venetoclax  200mg  2 weeks on 2 weeks off - 06/05/2022: Flow cytometric: No significant CD34 positive blasts.  Bone marrow: Slightly hypercellular bone marrow for age.  Consistent with residual MDS.  Absent ring sideroblasts.  Normal male karyotype. -04/29/2022- Current: Decreased azacitidine  to 5 days and continued venetoclax  200 mg days 1-14 of a 42-day cycle  Current Treatment: Azacitidine  (Days 1-5) + Venetoclax  200mg  days 1-14 of a 42 day cycle.    INTERVAL HISTORY:   Chad Lamb is here today for follow up and transition of care.  He developed a rash after his last treatment, located at the hairline and on the hand. A dermatologist evaluated the rash and performed a biopsy, which returned negative results. The rash is not itchy.  He experiences constipation during treatments. No diarrhea or significant weight loss.  He denies fever, chills, loss of appetite.  Overall, he is doing well.  I have reviewed the past medical history, past surgical history, social history and family history with the patient and they are unchanged from previous note.  ALLERGIES:  is allergic to cephalosporins, amoxapine and related, amoxicillin, cephalexin, clindamycin/lincomycin, sulfamethoxazole, and trimethoprim.  MEDICATIONS:  Current Outpatient Medications  Medication Sig Dispense Refill   albuterol  (PROVENTIL  HFA;VENTOLIN  HFA) 108 (90 Base) MCG/ACT inhaler Inhale 1-2 puffs into the lungs every 6 (six) hours as needed for wheezing or shortness of  breath.     ALPRAZolam (XANAX) 0.5 MG tablet Take 0.5 mg by mouth 2 (two) times daily as needed for anxiety.     amLODipine (NORVASC) 2.5 MG tablet Take 2.5 mg by mouth daily.     azaCITIDine  5 mg/2 mLs in lactated ringers  infusion Inject into the vein daily. Days 1-7 every 28 days     DENTA 5000 PLUS 1.1 % CREA dental cream Take by mouth.     docusate sodium (COLACE) 100 MG capsule 1 capsule Orally daily (3 daily when on chemo)     fluticasone  furoate-vilanterol (BREO ELLIPTA) 200-25 MCG/ACT AEPB Inhale 1 puff into the lungs daily.     Lactulose  20 GM/30ML SOLN Take 30 mLs (20 g total) by mouth at bedtime. 473 mL 3   Lidocaine -Hydrocort , Perianal, 3-0.5 % CREA Apply topically 2 (two) times daily as needed.     Lidocaine -Hydrocortisone  Ace 3-0.5 % CREA Apply 1 application topically as directed. 30 Tube 11   lidocaine -prilocaine  (EMLA ) cream Apply a SMALL AMOUNT TO port a cath site (DO not RUB in) AND cover with PLASTIC WRAP 1 hour prior TO infusion appointment 30 g 3   loratadine (CLARITIN)  10 MG tablet Take 10 mg by mouth daily.     Lutein 40 MG CAPS Take 40 mg by mouth daily.     Multiple Vitamins-Minerals (CENTRUM ADULTS PO) Take 1 tablet by mouth daily.     nystatin cream (MYCOSTATIN) Apply 1 application. topically 2 (two) times daily as needed for dry skin.     ofloxacin (OCUFLOX) 0.3 % ophthalmic solution Place 1 drop into the left eye See admin instructions. 1 drop to left eye 4 x daily for 7 days after monthly procedure.     omeprazole (PRILOSEC OTC) 20 MG tablet Take 5-10 mg by mouth daily.     prochlorperazine  (COMPAZINE ) 10 MG tablet Take 1 tablet (10 mg total) by mouth every 6 (six) hours as needed (Nausea or vomiting). 30 tablet 2   sildenafil (VIAGRA) 50 MG tablet Take 50 mg by mouth daily as needed for erectile dysfunction.     traMADol  (ULTRAM ) 50 MG tablet Take 1 tablet (50 mg total) by mouth every 6 (six) hours as needed. 15 tablet 0   venetoclax  (VENCLEXTA ) 100 MG tablet Take 2  tablets (200 mg total) by mouth daily. Take for 14 days, then hold for 28 days. Repeat every 42 days. Take with a meal and a full glass of water . 28 tablet 1   No current facility-administered medications for this visit.   Facility-Administered Medications Ordered in Other Visits  Medication Dose Route Frequency Provider Last Rate Last Admin   sodium chloride  flush (NS) 0.9 % injection 10 mL  10 mL Intracatheter PRN Rogers Hai, MD   10 mL at 06/12/22 1606    REVIEW OF SYSTEMS:   Constitutional: Denies fevers, chills or abnormal weight loss Eyes: Denies blurriness of vision Ears, nose, mouth, throat, and face: Denies mucositis or sore throat Respiratory: Denies cough, dyspnea or wheezes Cardiovascular: Denies palpitation, chest discomfort or lower extremity swelling Gastrointestinal:  Denies nausea, heartburn or change in bowel habits Skin: Denies abnormal skin rashes Lymphatics: Denies new lymphadenopathy or easy bruising Neurological:Denies numbness, tingling or new weaknesses Behavioral/Psych: Mood is stable, no new changes  All other systems were reviewed with the patient and are negative.   VITALS:  Blood pressure 133/88, pulse 64, temperature 97.9 F (36.6 C), temperature source Oral, resp. rate 18, weight 149 lb 0.5 oz (67.6 kg), SpO2 100%.  Wt Readings from Last 3 Encounters:  10/23/23 149 lb 0.5 oz (67.6 kg)  10/23/23 150 lb 12.7 oz (68.4 kg)  09/22/23 150 lb 12.7 oz (68.4 kg)    Body mass index is 22.01 kg/m.  Performance status (ECOG): 1 - Symptomatic but completely ambulatory  PHYSICAL EXAM:   GENERAL:alert, no distress and comfortable SKIN: skin color, texture, turgor are normal, no rashes or significant lesions LYMPH:  no palpable lymphadenopathy in the cervical, axillary or inguinal LUNGS: clear to auscultation and percussion with normal breathing effort HEART: regular rate & rhythm and no murmurs and no lower extremity edema ABDOMEN:abdomen soft,  non-tender and normal bowel sounds Musculoskeletal:no cyanosis of digits and no clubbing  NEURO: alert & oriented x 3 with fluent speech, no focal motor/sensory deficits  LABORATORY DATA:  I have reviewed the data as listed    Component Value Date/Time   NA 140 10/23/2023 1428   K 4.1 10/23/2023 1428   CL 105 10/23/2023 1428   CO2 24 10/23/2023 1428   GLUCOSE 98 10/23/2023 1428   BUN 15 10/23/2023 1428   CREATININE 0.92 10/23/2023 1428   CREATININE 0.98 02/12/2017 1421  CALCIUM 9.7 10/23/2023 1428   PROT 6.0 (L) 10/23/2023 1428   ALBUMIN 3.8 10/23/2023 1428   AST 20 10/23/2023 1428   ALT 15 10/23/2023 1428   ALKPHOS 65 10/23/2023 1428   BILITOT 1.4 (H) 10/23/2023 1428   GFRNONAA >60 10/23/2023 1428   GFRNONAA 77 02/12/2017 1421   GFRAA >60 11/19/2017 1532   GFRAA 89 02/12/2017 1421     Lab Results  Component Value Date   WBC 3.9 (L) 10/23/2023   NEUTROABS 2.0 10/23/2023   HGB 12.0 (L) 10/23/2023   HCT 37.7 (L) 10/23/2023   MCV 83.0 10/23/2023   PLT 254 10/23/2023      Chemistry      Component Value Date/Time   NA 140 10/23/2023 1428   K 4.1 10/23/2023 1428   CL 105 10/23/2023 1428   CO2 24 10/23/2023 1428   BUN 15 10/23/2023 1428   CREATININE 0.92 10/23/2023 1428   CREATININE 0.98 02/12/2017 1421      Component Value Date/Time   CALCIUM 9.7 10/23/2023 1428   ALKPHOS 65 10/23/2023 1428   AST 20 10/23/2023 1428   ALT 15 10/23/2023 1428   BILITOT 1.4 (H) 10/23/2023 1428       RADIOGRAPHIC STUDIES: I have personally reviewed the radiological images as listed and agreed with the findings in the report.  None new to review

## 2023-10-26 NOTE — Assessment & Plan Note (Signed)
 Patient has erythematous not papular rash on arms, hand, suprapubic area. Evaluated by dermatologist and was nonmalignant  - Improved with hydrocortisone  cream - Continue the hydrocortisone  cream twice a day

## 2023-10-26 NOTE — Assessment & Plan Note (Signed)
 Patient reports constipation during cycles.  - Continue Colace as prescribed and MiraLAX as needed

## 2023-10-26 NOTE — Assessment & Plan Note (Signed)
 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 Had good response with azacitidine  and venetoclax  Currently on azacitidine  days 1-5 and venetoclax  200 mg days 1-14 of a 42-day cycle Last bone marrow biopsy was from March 2024 with very good response  - Labs reviewed today, has some leukopenia and anemia.  Neck cycle due on 10/27/2023. Normal LFTs and creatinine. - Will consider bone marrow biopsy if there is significant change in the counts -Proceed with azacitidine  on Monday.  Continue venetoclax  200 mg days 1-14 of a 42-day cycle.  Return to clinic in 7 weeks before next cycle

## 2023-10-27 ENCOUNTER — Inpatient Hospital Stay

## 2023-10-27 ENCOUNTER — Encounter: Payer: Self-pay | Admitting: Hematology

## 2023-10-27 VITALS — BP 134/82 | HR 60 | Temp 97.4°F | Resp 18 | Ht 69.5 in | Wt 150.8 lb

## 2023-10-27 DIAGNOSIS — D469 Myelodysplastic syndrome, unspecified: Secondary | ICD-10-CM

## 2023-10-27 DIAGNOSIS — Z95828 Presence of other vascular implants and grafts: Secondary | ICD-10-CM

## 2023-10-27 DIAGNOSIS — Z5111 Encounter for antineoplastic chemotherapy: Secondary | ICD-10-CM | POA: Diagnosis not present

## 2023-10-27 MED ORDER — PALONOSETRON HCL INJECTION 0.25 MG/5ML
0.2500 mg | Freq: Once | INTRAVENOUS | Status: AC
  Administered 2023-10-27 (×2): 0.25 mg via INTRAVENOUS
  Filled 2023-10-27: qty 5

## 2023-10-27 MED ORDER — SODIUM CHLORIDE 0.9 % IV SOLN
Freq: Once | INTRAVENOUS | Status: AC
Start: 2023-10-27 — End: 2023-10-27

## 2023-10-27 MED ORDER — SODIUM CHLORIDE 0.9 % IV SOLN
75.0000 mg/m2 | Freq: Once | INTRAVENOUS | Status: AC
  Administered 2023-10-27 (×2): 140 mg via INTRAVENOUS
  Filled 2023-10-27: qty 14

## 2023-10-27 MED ORDER — DEXAMETHASONE SODIUM PHOSPHATE 10 MG/ML IJ SOLN
10.0000 mg | Freq: Once | INTRAMUSCULAR | Status: AC
  Administered 2023-10-27 (×2): 10 mg via INTRAVENOUS
  Filled 2023-10-27: qty 1

## 2023-10-27 NOTE — Progress Notes (Signed)
 Patient presents today for chemotherapy infusion.  Patient is in satisfactory condition with no new complaints voiced.  Vital signs are stable.  Labs reviewed by Dr.Kandala on 10/23/2023 and all labs are within treatment parameters.  We will proceed with treatment per MD orders.    Treatment given today per MD orders. Tolerated infusion without adverse affects. Vital signs stable. No complaints at this time. Discharged from clinic ambulatory in stable condition. Alert and oriented x 3. F/U with Chad Lamb as scheduled.

## 2023-10-27 NOTE — Patient Instructions (Signed)
 [facility] [place] - A DEPT OF [facility]  Discharge Instructions: Thank you for choosing [facility] to provide your oncology and hematology care.  If you have a lab appointment with the Cancer Center - please note that after April 8th, 2024, all labs will be drawn in the cancer center.  You do not have to check in or register with the main entrance as you have in the past but will complete your check-in in the cancer center.  Wear comfortable clothing and clothing appropriate for easy access to any Portacath or PICC line.   We strive to give you quality time with your provider. You may need to reschedule your appointment if you arrive late (15 or more minutes).  Arriving late affects you and other patients whose appointments are after yours.  Also, if you miss three or more appointments without notifying the office, you may be dismissed from the clinic at the provider's discretion.      For prescription refill requests, have your pharmacy contact our office and allow 72 hours for refills to be completed.    Today you received the following chemotherapy and/or immunotherapy agents D1 Vidaza   To help prevent nausea and vomiting after your treatment, we encourage you to take your nausea medication as directed.  Azacitidine Injection What is this medication? AZACITIDINE (ay za SITE i deen) treats blood and bone marrow cancers. It works by slowing down the growth of cancer cells. This medicine may be used for other purposes; ask your health care provider or pharmacist if you have questions. COMMON BRAND NAME(S): Vidaza What should I tell my care team before I take this medication? They need to know if you have any of these conditions: Kidney disease Liver disease Low blood cell levels, such as low white cells, platelets, or red blood cells Low levels of albumin in the blood Low levels of bicarbonate in the blood An unusual or allergic reaction to  azacitidine, mannitol, other medications, foods, dyes, or preservatives If you or your partner are pregnant or trying to get pregnant Breast-feeding How should I use this medication? This medication is injected into a vein or under the skin. It is given by your care team in a hospital or clinic setting. Talk to your care team about the use of this medication in children. While it may be prescribed for children as young as 1 month for selected conditions, precautions do apply. Overdosage: If you think you have taken too much of this medicine contact a poison control center or emergency room at once. NOTE: This medicine is only for you. Do not share this medicine with others. What if I miss a dose? Keep appointments for follow-up doses. It is important not to miss your dose. Call your care team if you are unable to keep an appointment. What may interact with this medication? Interactions are not expected. This list may not describe all possible interactions. Give your health care provider a list of all the medicines, herbs, non-prescription drugs, or dietary supplements you use. Also tell them if you smoke, drink alcohol, or use illegal drugs. Some items may interact with your medicine. What should I watch for while using this medication? Your condition will be monitored carefully while you are receiving this medication. This medication may make you feel generally unwell. This is not uncommon as chemotherapy can affect healthy cells as well as cancer cells. Report any side effects. Continue your course of treatment  even though you feel ill unless your care team tells you to stop. You may need blood work done while you are taking this medication. Other product types may be available that contain the medication azacitidine. The injection and oral products should not be used in place of one another. Talk to your care team if you have questions. This medication can cause serious side effects. To reduce  the risk, your care team may give you other medications to take before receiving this one. Be sure to follow the directions from your care team. This medication may increase your risk of getting an infection. Call your care team for advice if you get a fever, chills, sore throat, or other symptoms of a cold or flu. Do not treat yourself. Try to avoid being around people who are sick. Avoid taking medications that contain aspirin, acetaminophen, ibuprofen, naproxen, or ketoprofen unless instructed by your care team. These medications may hide a fever. Be careful brushing or flossing your teeth or using a toothpick because you may get an infection or bleed more easily. If you have any dental work done, tell your dentist you are receiving this medication. Talk to your care team if you or your partner may be pregnant. Serious birth defects can occur if you take this medication during pregnancy and for 6 months after the last dose. You will need a negative pregnancy test before starting this medication. Contraception is recommended while taking this medication and for 6 months after the last dose. Your care team can help you find the option that works for you. If your partner can get pregnant, use a condom during sex while taking this medication and for 3 months after the last dose. Do not breastfeed while taking this medication and for 1 week after the last dose. This medication may cause infertility. Talk to your care team if you are concerned about your fertility. What side effects may I notice from receiving this medication? Side effects that you should report to your care team as soon as possible: Allergic reactions--skin rash, itching, hives, swelling of the face, lips, tongue, or throat Infection--fever, chills, cough, sore throat, wounds that don't heal, pain or trouble when passing urine, general feeling of discomfort or being unwell Kidney injury--decrease in the amount of urine, swelling of the  ankles, hands, or feet Liver injury--right upper belly pain, loss of appetite, nausea, light-colored stool, dark yellow or brown urine, yellowing skin or eyes, unusual weakness or fatigue Low red blood cell level--unusual weakness or fatigue, dizziness, headache, trouble breathing Tumor lysis syndrome (TLS)--nausea, vomiting, diarrhea, decrease in the amount of urine, dark urine, unusual weakness or fatigue, confusion, muscle pain or cramps, fast or irregular heartbeat, joint pain Unusual bruising or bleeding Side effects that usually do not require medical attention (report to your care team if they continue or are bothersome): Constipation Diarrhea Nausea Pain, redness, or irritation at injection site Vomiting This list may not describe all possible side effects. Call your doctor for medical advice about side effects. You may report side effects to FDA at 1-800-FDA-1088. Where should I keep my medication? This medication is given in a hospital or clinic. It will not be stored at home. NOTE: This sheet is a summary. It may not cover all possible information. If you have questions about this medicine, talk to your doctor, pharmacist, or health care provider.  2024 Elsevier/Gold Standard (2022-11-04 00:00:00) BELOW ARE SYMPTOMS THAT SHOULD BE REPORTED IMMEDIATELY: *FEVER GREATER THAN 100.4 F (38 C) OR  HIGHER *CHILLS OR SWEATING *NAUSEA AND VOMITING THAT IS NOT CONTROLLED WITH YOUR NAUSEA MEDICATION *UNUSUAL SHORTNESS OF BREATH *UNUSUAL BRUISING OR BLEEDING *URINARY PROBLEMS (pain or burning when urinating, or frequent urination) *BOWEL PROBLEMS (unusual diarrhea, constipation, pain near the anus) TENDERNESS IN MOUTH AND THROAT WITH OR WITHOUT PRESENCE OF ULCERS (sore throat, sores in mouth, or a toothache) UNUSUAL RASH, SWELLING OR PAIN  UNUSUAL VAGINAL DISCHARGE OR ITCHING   Items with * indicate a potential emergency and should be followed up as soon as possible or go to the Emergency  Department if any problems should occur.  Please show the CHEMOTHERAPY ALERT CARD or IMMUNOTHERAPY ALERT CARD at check-in to the Emergency Department and triage nurse.  Should you have questions after your visit or need to cancel or reschedule your appointment, please contact [facility] [place] - A DEPT OF [facility] [phone]  and follow the prompts.  Office hours are 8:00 a.m. to 4:30 p.m. Monday - Friday. Please note that voicemails left after 4:00 p.m. may not be returned until the following business day.  We are closed weekends and major holidays. You have access to a nurse at all times for urgent questions. Please call the main number to the clinic [phone] and follow the prompts.  For any non-urgent questions, you may also contact your provider using MyChart. We now offer e-Visits for anyone 70 and older to request care online for non-urgent symptoms. For details visit [email or web address].   Also download the MyChart app! Go to the app store, search "MyChart", open the app, select [facility], and log in with your MyChart username and password.

## 2023-10-27 NOTE — Patient Instructions (Signed)
 [facility] [place] - A DEPT OF [facility]  Discharge Instructions: Thank you for choosing [facility] to provide your oncology and hematology care.  If you have a lab appointment with the Cancer Center - please note that after April 8th, 2024, all labs will be drawn in the cancer center.  You do not have to check in or register with the main entrance as you have in the past but will complete your check-in in the cancer center.  Wear comfortable clothing and clothing appropriate for easy access to any Portacath or PICC line.   We strive to give you quality time with your provider. You may need to reschedule your appointment if you arrive late (15 or more minutes).  Arriving late affects you and other patients whose appointments are after yours.  Also, if you miss three or more appointments without notifying the office, you may be dismissed from the clinic at the provider's discretion.      For prescription refill requests, have your pharmacy contact our office and allow 72 hours for refills to be completed.    Today you received the following chemotherapy and/or immunotherapy agents D1 Vidaza       To help prevent nausea and vomiting after your treatment, we encourage you to take your nausea medication as directed.  Azacitidine  Injection What is this medication? AZACITIDINE  (ay [employer] i deen) treats blood and bone marrow cancers. It works by slowing down the growth of cancer cells. This medicine may be used for other purposes; ask your health care provider or pharmacist if you have questions. COMMON BRAND NAME(S): Vidaza  What should I tell my care team before I take this medication? They need to know if you have any of these conditions: Kidney disease Liver disease Low blood cell levels, such as low white cells, platelets, or red blood cells Low levels of albumin in the blood Low levels of bicarbonate in the blood An unusual or allergic reaction to  azacitidine , mannitol, other medications, foods, dyes, or preservatives If you or your partner are pregnant or trying to get pregnant Breast-feeding How should I use this medication? This medication is injected into a vein or under the skin. It is given by your care team in a hospital or clinic setting. Talk to your care team about the use of this medication in children. While it may be prescribed for children as young as 1 month for selected conditions, precautions do apply. Overdosage: If you think you have taken too much of this medicine contact a poison control center or emergency room at once. NOTE: This medicine is only for you. Do not share this medicine with others. What if I miss a dose? Keep appointments for follow-up doses. It is important not to miss your dose. Call your care team if you are unable to keep an appointment. What may interact with this medication? Interactions are not expected. This list may not describe all possible interactions. Give your health care provider a list of all the medicines, herbs, non-prescription drugs, or dietary supplements you use. Also tell them if you smoke, drink alcohol, or use illegal drugs. Some items may interact with your medicine. What should I watch for while using this medication? Your condition will be monitored carefully while you are receiving this medication. This medication may make you feel generally unwell. This is not uncommon as chemotherapy can affect healthy cells as well as cancer cells. Report any side effects. Continue your  course of treatment even though you feel ill unless your care team tells you to stop. You may need blood work done while you are taking this medication. Other product types may be available that contain the medication azacitidine . The injection and oral products should not be used in place of one another. Talk to your care team if you have questions. This medication can cause serious side effects. To reduce  the risk, your care team may give you other medications to take before receiving this one. Be sure to follow the directions from your care team. This medication may increase your risk of getting an infection. Call your care team for advice if you get a fever, chills, sore throat, or other symptoms of a cold or flu. Do not treat yourself. Try to avoid being around people who are sick. Avoid taking medications that contain aspirin, acetaminophen , ibuprofen, naproxen, or ketoprofen unless instructed by your care team. These medications may hide a fever. Be careful brushing or flossing your teeth or using a toothpick because you may get an infection or bleed more easily. If you have any dental work done, tell your dentist you are receiving this medication. Talk to your care team if you or your partner may be pregnant. Serious birth defects can occur if you take this medication during pregnancy and for 6 months after the last dose. You will need a negative pregnancy test before starting this medication. Contraception is recommended while taking this medication and for 6 months after the last dose. Your care team can help you find the option that works for you. If your partner can get pregnant, use a condom during sex while taking this medication and for 3 months after the last dose. Do not breastfeed while taking this medication and for 1 week after the last dose. This medication may cause infertility. Talk to your care team if you are concerned about your fertility. What side effects may I notice from receiving this medication? Side effects that you should report to your care team as soon as possible: Allergic reactions--skin rash, itching, hives, swelling of the face, lips, tongue, or throat Infection--fever, chills, cough, sore throat, wounds that don't heal, pain or trouble when passing urine, general feeling of discomfort or being unwell Kidney injury--decrease in the amount of urine, swelling of the  ankles, hands, or feet Liver injury--right upper belly pain, loss of appetite, nausea, light-colored stool, dark yellow or brown urine, yellowing skin or eyes, unusual weakness or fatigue Low red blood cell level--unusual weakness or fatigue, dizziness, headache, trouble breathing Tumor lysis syndrome (TLS)--nausea, vomiting, diarrhea, decrease in the amount of urine, dark urine, unusual weakness or fatigue, confusion, muscle pain or cramps, fast or irregular heartbeat, joint pain Unusual bruising or bleeding Side effects that usually do not require medical attention (report to your care team if they continue or are bothersome): Constipation Diarrhea Nausea Pain, redness, or irritation at injection site Vomiting This list may not describe all possible side effects. Call your doctor for medical advice about side effects. You may report side effects to FDA at 1-800-FDA-1088. Where should I keep my medication? This medication is given in a hospital or clinic. It will not be stored at home. NOTE: This sheet is a summary. It may not cover all possible information. If you have questions about this medicine, talk to your doctor, pharmacist, or health care provider.  2024 Elsevier/Gold Standard (2022-11-04 00:00:00)  BELOW ARE SYMPTOMS THAT SHOULD BE REPORTED IMMEDIATELY: *FEVER GREATER THAN 100.4  F (38 C) OR HIGHER *CHILLS OR SWEATING *NAUSEA AND VOMITING THAT IS NOT CONTROLLED WITH YOUR NAUSEA MEDICATION *UNUSUAL SHORTNESS OF BREATH *UNUSUAL BRUISING OR BLEEDING *URINARY PROBLEMS (pain or burning when urinating, or frequent urination) *BOWEL PROBLEMS (unusual diarrhea, constipation, pain near the anus) TENDERNESS IN MOUTH AND THROAT WITH OR WITHOUT PRESENCE OF ULCERS (sore throat, sores in mouth, or a toothache) UNUSUAL RASH, SWELLING OR PAIN  UNUSUAL VAGINAL DISCHARGE OR ITCHING   Items with * indicate a potential emergency and should be followed up as soon as possible or go to the  Emergency Department if any problems should occur.  Please show the CHEMOTHERAPY ALERT CARD or IMMUNOTHERAPY ALERT CARD at check-in to the Emergency Department and triage nurse.  Should you have questions after your visit or need to cancel or reschedule your appointment, please contact [facility]  - A DEPT OF [facility] [phone]  and follow the prompts.  Office hours are 8:00 a.m. to 4:30 p.m. Monday - Friday. Please note that voicemails left after 4:00 p.m. may not be returned until the following business day.  We are closed weekends and major holidays. You have access to a nurse at all times for urgent questions. Please call the main number to the clinic [phone] and follow the prompts.  For any non-urgent questions, you may also contact your provider using MyChart. We now offer e-Visits for anyone 3 and older to request care online for non-urgent symptoms. For details visit [email or web address].   Also download the MyChart app! Go to the app store, search MyChart, open the app, select [facility], and log in with your MyChart username and password.

## 2023-10-28 ENCOUNTER — Encounter: Payer: Self-pay | Admitting: Oncology

## 2023-10-28 ENCOUNTER — Inpatient Hospital Stay

## 2023-10-28 VITALS — BP 129/78 | HR 60 | Temp 96.6°F | Resp 18

## 2023-10-28 DIAGNOSIS — D469 Myelodysplastic syndrome, unspecified: Secondary | ICD-10-CM

## 2023-10-28 DIAGNOSIS — Z95828 Presence of other vascular implants and grafts: Secondary | ICD-10-CM

## 2023-10-28 DIAGNOSIS — Z5111 Encounter for antineoplastic chemotherapy: Secondary | ICD-10-CM | POA: Diagnosis not present

## 2023-10-28 MED ORDER — SODIUM CHLORIDE 0.9 % IV SOLN: Freq: Once | INTRAVENOUS | Status: AC

## 2023-10-28 MED ORDER — SODIUM CHLORIDE 0.9 % IV SOLN
75.0000 mg/m2 | Freq: Once | INTRAVENOUS | Status: AC
  Administered 2023-10-28 (×2): 140 mg via INTRAVENOUS
  Filled 2023-10-28: qty 14

## 2023-10-28 MED ORDER — DEXAMETHASONE SODIUM PHOSPHATE 10 MG/ML IJ SOLN
10.0000 mg | Freq: Once | INTRAMUSCULAR | Status: AC
  Administered 2023-10-28 (×2): 10 mg via INTRAVENOUS
  Filled 2023-10-28: qty 1

## 2023-10-28 NOTE — Patient Instructions (Signed)
 [facility] [place] - A DEPT OF [facility]  Discharge Instructions: Thank you for choosing [facility] to provide your oncology and hematology care.  If you have a lab appointment with the Cancer Center - please note that after April 8th, 2024, all labs will be drawn in the cancer center.  You do not have to check in or register with the main entrance as you have in the past but will complete your check-in in the cancer center.  Wear comfortable clothing and clothing appropriate for easy access to any Portacath or PICC line.   We strive to give you quality time with your provider. You may need to reschedule your appointment if you arrive late (15 or more minutes).  Arriving late affects you and other patients whose appointments are after yours.  Also, if you miss three or more appointments without notifying the office, you may be dismissed from the clinic at the provider's discretion.      For prescription refill requests, have your pharmacy contact our office and allow 72 hours for refills to be completed.    Today you received the following chemotherapy and/or immunotherapy agents Vidaza , return as scheduled.   To help prevent nausea and vomiting after your treatment, we encourage you to take your nausea medication as directed.  BELOW ARE SYMPTOMS THAT SHOULD BE REPORTED IMMEDIATELY: *FEVER GREATER THAN 100.4 F (38 C) OR HIGHER *CHILLS OR SWEATING *NAUSEA AND VOMITING THAT IS NOT CONTROLLED WITH YOUR NAUSEA MEDICATION *UNUSUAL SHORTNESS OF BREATH *UNUSUAL BRUISING OR BLEEDING *URINARY PROBLEMS (pain or burning when urinating, or frequent urination) *BOWEL PROBLEMS (unusual diarrhea, constipation, pain near the anus) TENDERNESS IN MOUTH AND THROAT WITH OR WITHOUT PRESENCE OF ULCERS (sore throat, sores in mouth, or a toothache) UNUSUAL RASH, SWELLING OR PAIN  UNUSUAL VAGINAL DISCHARGE OR ITCHING   Items with * indicate a potential emergency and  should be followed up as soon as possible or go to the Emergency Department if any problems should occur.  Please show the CHEMOTHERAPY ALERT CARD or IMMUNOTHERAPY ALERT CARD at check-in to the Emergency Department and triage nurse.  Should you have questions after your visit or need to cancel or reschedule your appointment, please contact [facility] [place] - A DEPT OF [facility] [phone]  and follow the prompts.  Office hours are 8:00 a.m. to 4:30 p.m. Monday - Friday. Please note that voicemails left after 4:00 p.m. may not be returned until the following business day.  We are closed weekends and major holidays. You have access to a nurse at all times for urgent questions. Please call the main number to the clinic [phone] and follow the prompts.  For any non-urgent questions, you may also contact your provider using MyChart. We now offer e-Visits for anyone 45 and older to request care online for non-urgent symptoms. For details visit [email or web address].   Also download the MyChart app! Go to the app store, search MyChart, open the app, select [place], and log in with your MyChart username and password.

## 2023-10-28 NOTE — Progress Notes (Signed)
Patient tolerated chemotherapy with no complaints voiced. Side effects with management reviewed understanding verbalized. Port site clean and dry with no bruising or swelling noted at site. Good blood return noted before and after administration of chemotherapy. Band aid applied. Patient left in satisfactory condition with VSS and no s/s of distress noted. 

## 2023-10-29 ENCOUNTER — Inpatient Hospital Stay

## 2023-10-29 ENCOUNTER — Encounter: Payer: Self-pay | Admitting: Oncology

## 2023-10-29 VITALS — BP 155/90 | HR 58 | Temp 97.5°F | Resp 16

## 2023-10-29 DIAGNOSIS — D469 Myelodysplastic syndrome, unspecified: Secondary | ICD-10-CM

## 2023-10-29 DIAGNOSIS — Z5111 Encounter for antineoplastic chemotherapy: Secondary | ICD-10-CM | POA: Diagnosis not present

## 2023-10-29 DIAGNOSIS — Z95828 Presence of other vascular implants and grafts: Secondary | ICD-10-CM

## 2023-10-29 MED ORDER — SODIUM CHLORIDE 0.9 % IV SOLN
75.0000 mg/m2 | Freq: Once | INTRAVENOUS | Status: AC
  Administered 2023-10-29 (×2): 140 mg via INTRAVENOUS
  Filled 2023-10-29: qty 14

## 2023-10-29 MED ORDER — SODIUM CHLORIDE 0.9 % IV SOLN
Freq: Once | INTRAVENOUS | Status: AC
Start: 2023-10-29 — End: 2023-10-29

## 2023-10-29 MED ORDER — PALONOSETRON HCL INJECTION 0.25 MG/5ML
0.2500 mg | Freq: Once | INTRAVENOUS | Status: AC
Start: 2023-10-29 — End: 2023-10-29
  Administered 2023-10-29 (×2): 0.25 mg via INTRAVENOUS
  Filled 2023-10-29: qty 5

## 2023-10-29 MED ORDER — DEXAMETHASONE SODIUM PHOSPHATE 10 MG/ML IJ SOLN
10.0000 mg | Freq: Once | INTRAMUSCULAR | Status: AC
  Administered 2023-10-29 (×2): 10 mg via INTRAVENOUS
  Filled 2023-10-29: qty 1

## 2023-10-29 NOTE — Patient Instructions (Signed)
 [facility] [place] - A DEPT OF [facility]  Discharge Instructions: Thank you for choosing [facility] to provide your oncology and hematology care.  If you have a lab appointment with the Cancer Center - please note that after April 8th, 2024, all labs will be drawn in the cancer center.  You do not have to check in or register with the main entrance as you have in the past but will complete your check-in in the cancer center.  Wear comfortable clothing and clothing appropriate for easy access to any Portacath or PICC line.   We strive to give you quality time with your provider. You may need to reschedule your appointment if you arrive late (15 or more minutes).  Arriving late affects you and other patients whose appointments are after yours.  Also, if you miss three or more appointments without notifying the office, you may be dismissed from the clinic at the provider's discretion.      For prescription refill requests, have your pharmacy contact our office and allow 72 hours for refills to be completed.    Today you received the following chemotherapy and/or immunotherapy agents Azacitadine    To help prevent nausea and vomiting after your treatment, we encourage you to take your nausea medication as directed.  BELOW ARE SYMPTOMS THAT SHOULD BE REPORTED IMMEDIATELY: *FEVER GREATER THAN 100.4 F (38 C) OR HIGHER *CHILLS OR SWEATING *NAUSEA AND VOMITING THAT IS NOT CONTROLLED WITH YOUR NAUSEA MEDICATION *UNUSUAL SHORTNESS OF BREATH *UNUSUAL BRUISING OR BLEEDING *URINARY PROBLEMS (pain or burning when urinating, or frequent urination) *BOWEL PROBLEMS (unusual diarrhea, constipation, pain near the anus) TENDERNESS IN MOUTH AND THROAT WITH OR WITHOUT PRESENCE OF ULCERS (sore throat, sores in mouth, or a toothache) UNUSUAL RASH, SWELLING OR PAIN  UNUSUAL VAGINAL DISCHARGE OR ITCHING   Items with * indicate a potential emergency and should be followed  up as soon as possible or go to the Emergency Department if any problems should occur.  Please show the CHEMOTHERAPY ALERT CARD or IMMUNOTHERAPY ALERT CARD at check-in to the Emergency Department and triage nurse.  Should you have questions after your visit or need to cancel or reschedule your appointment, please contact [facility] [place] - A DEPT OF [facility] [phone]  and follow the prompts.  Office hours are 8:00 a.m. to 4:30 p.m. Monday - Friday. Please note that voicemails left after 4:00 p.m. may not be returned until the following business day.  We are closed weekends and major holidays. You have access to a nurse at all times for urgent questions. Please call the main number to the clinic [phone] and follow the prompts.  For any non-urgent questions, you may also contact your provider using MyChart. We now offer e-Visits for anyone 58 and older to request care online for non-urgent symptoms. For details visit [email or web address].   Also download the MyChart app! Go to the app store, search "MyChart", open the app, select [place], and log in with your MyChart username and password.

## 2023-10-29 NOTE — Progress Notes (Signed)
Treatment given per orders. Patient tolerated it well without problems. Vitals stable and discharged home from clinic ambulatory. Follow up as scheduled.  

## 2023-10-30 ENCOUNTER — Encounter: Payer: Self-pay | Admitting: Oncology

## 2023-10-30 ENCOUNTER — Inpatient Hospital Stay

## 2023-10-30 VITALS — BP 139/84 | HR 63 | Temp 97.8°F | Resp 18

## 2023-10-30 DIAGNOSIS — D469 Myelodysplastic syndrome, unspecified: Secondary | ICD-10-CM

## 2023-10-30 DIAGNOSIS — Z95828 Presence of other vascular implants and grafts: Secondary | ICD-10-CM

## 2023-10-30 DIAGNOSIS — Z5111 Encounter for antineoplastic chemotherapy: Secondary | ICD-10-CM | POA: Diagnosis not present

## 2023-10-30 MED ORDER — DEXAMETHASONE SODIUM PHOSPHATE 10 MG/ML IJ SOLN
10.0000 mg | Freq: Once | INTRAMUSCULAR | Status: AC
  Administered 2023-10-30: 10 mg via INTRAVENOUS
  Filled 2023-10-30: qty 1

## 2023-10-30 MED ORDER — SODIUM CHLORIDE 0.9 % IV SOLN: Freq: Once | INTRAVENOUS | Status: AC

## 2023-10-30 MED ORDER — SODIUM CHLORIDE 0.9 % IV SOLN
75.0000 mg/m2 | Freq: Once | INTRAVENOUS | Status: AC
  Administered 2023-10-30: 140 mg via INTRAVENOUS
  Filled 2023-10-30: qty 14

## 2023-10-30 NOTE — Progress Notes (Signed)
 Vidaza  given today per MD orders. Tolerated infusion without adverse affects. Vital signs stable. No complaints at this time. Discharged from clinic ambulatory in stable condition. Alert and oriented x 3. F/U with [facility] as scheduled.

## 2023-10-30 NOTE — Progress Notes (Signed)
 Patient presents today for D4 Vidaza  infusion.  Patient is in satisfactory condition with no new complaints voiced.  Vital signs are stable.  Labs reviewed and all labs are within treatment parameters.  We will proceed with treatment per MD orders.

## 2023-10-30 NOTE — Patient Instructions (Signed)
 [facility] [place] - A DEPT OF [facility]  Discharge Instructions: Thank you for choosing [facility] to provide your oncology and hematology care.  If you have a lab appointment with the Cancer Center - please note that after April 8th, 2024, all labs will be drawn in the cancer center.  You do not have to check in or register with the main entrance as you have in the past but will complete your check-in in the cancer center.  Wear comfortable clothing and clothing appropriate for easy access to any Portacath or PICC line.   We strive to give you quality time with your provider. You may need to reschedule your appointment if you arrive late (15 or more minutes).  Arriving late affects you and other patients whose appointments are after yours.  Also, if you miss three or more appointments without notifying the office, you may be dismissed from the clinic at the provider's discretion.      For prescription refill requests, have your pharmacy contact our office and allow 72 hours for refills to be completed.    Today you received the following chemotherapy and/or immunotherapy agents Vidaza.  Azacitidine Injection What is this medication? AZACITIDINE (ay za SITE i deen) treats blood and bone marrow cancers. It works by slowing down the growth of cancer cells. This medicine may be used for other purposes; ask your health care provider or pharmacist if you have questions. COMMON BRAND NAME(S): Vidaza What should I tell my care team before I take this medication? They need to know if you have any of these conditions: Kidney disease Liver disease Low blood cell levels, such as low white cells, platelets, or red blood cells Low levels of albumin in the blood Low levels of bicarbonate in the blood An unusual or allergic reaction to azacitidine, mannitol, other medications, foods, dyes, or preservatives If you or your partner are pregnant or trying to get  pregnant Breast-feeding How should I use this medication? This medication is injected into a vein or under the skin. It is given by your care team in a hospital or clinic setting. Talk to your care team about the use of this medication in children. While it may be prescribed for children as young as 1 month for selected conditions, precautions do apply. Overdosage: If you think you have taken too much of this medicine contact a poison control center or emergency room at once. NOTE: This medicine is only for you. Do not share this medicine with others. What if I miss a dose? Keep appointments for follow-up doses. It is important not to miss your dose. Call your care team if you are unable to keep an appointment. What may interact with this medication? Interactions are not expected. This list may not describe all possible interactions. Give your health care provider a list of all the medicines, herbs, non-prescription drugs, or dietary supplements you use. Also tell them if you smoke, drink alcohol, or use illegal drugs. Some items may interact with your medicine. What should I watch for while using this medication? Your condition will be monitored carefully while you are receiving this medication. This medication may make you feel generally unwell. This is not uncommon as chemotherapy can affect healthy cells as well as cancer cells. Report any side effects. Continue your course of treatment even though you feel ill unless your care team tells you to stop. You may need blood work done while you are  taking this medication. Other product types may be available that contain the medication azacitidine. The injection and oral products should not be used in place of one another. Talk to your care team if you have questions. This medication can cause serious side effects. To reduce the risk, your care team may give you other medications to take before receiving this one. Be sure to follow the directions from  your care team. This medication may increase your risk of getting an infection. Call your care team for advice if you get a fever, chills, sore throat, or other symptoms of a cold or flu. Do not treat yourself. Try to avoid being around people who are sick. Avoid taking medications that contain aspirin, acetaminophen, ibuprofen, naproxen, or ketoprofen unless instructed by your care team. These medications may hide a fever. Be careful brushing or flossing your teeth or using a toothpick because you may get an infection or bleed more easily. If you have any dental work done, tell your dentist you are receiving this medication. Talk to your care team if you or your partner may be pregnant. Serious birth defects can occur if you take this medication during pregnancy and for 6 months after the last dose. You will need a negative pregnancy test before starting this medication. Contraception is recommended while taking this medication and for 6 months after the last dose. Your care team can help you find the option that works for you. If your partner can get pregnant, use a condom during sex while taking this medication and for 3 months after the last dose. Do not breastfeed while taking this medication and for 1 week after the last dose. This medication may cause infertility. Talk to your care team if you are concerned about your fertility. What side effects may I notice from receiving this medication? Side effects that you should report to your care team as soon as possible: Allergic reactions--skin rash, itching, hives, swelling of the face, lips, tongue, or throat Infection--fever, chills, cough, sore throat, wounds that don't heal, pain or trouble when passing urine, general feeling of discomfort or being unwell Kidney injury--decrease in the amount of urine, swelling of the ankles, hands, or feet Liver injury--right upper belly pain, loss of appetite, nausea, light-colored stool, dark yellow or Chad Lamb  urine, yellowing skin or eyes, unusual weakness or fatigue Low red blood cell level--unusual weakness or fatigue, dizziness, headache, trouble breathing Tumor lysis syndrome (TLS)--nausea, vomiting, diarrhea, decrease in the amount of urine, dark urine, unusual weakness or fatigue, confusion, muscle pain or cramps, fast or irregular heartbeat, joint pain Unusual bruising or bleeding Side effects that usually do not require medical attention (report to your care team if they continue or are bothersome): Constipation Diarrhea Nausea Pain, redness, or irritation at injection site Vomiting This list may not describe all possible side effects. Call your doctor for medical advice about side effects. You may report side effects to FDA at 1-800-FDA-1088. Where should I keep my medication? This medication is given in a hospital or clinic. It will not be stored at home. NOTE: This sheet is a summary. It may not cover all possible information. If you have questions about this medicine, talk to your doctor, pharmacist, or health care provider.  2024 Elsevier/Gold Standard (2022-11-04 00:00:00)       To help prevent nausea and vomiting after your treatment, we encourage you to take your nausea medication as directed.  BELOW ARE SYMPTOMS THAT SHOULD BE REPORTED IMMEDIATELY: *FEVER GREATER THAN 100.4  F (38 C) OR HIGHER *CHILLS OR SWEATING *NAUSEA AND VOMITING THAT IS NOT CONTROLLED WITH YOUR NAUSEA MEDICATION *UNUSUAL SHORTNESS OF BREATH *UNUSUAL BRUISING OR BLEEDING *URINARY PROBLEMS (pain or burning when urinating, or frequent urination) *BOWEL PROBLEMS (unusual diarrhea, constipation, pain near the anus) TENDERNESS IN MOUTH AND THROAT WITH OR WITHOUT PRESENCE OF ULCERS (sore throat, sores in mouth, or a toothache) UNUSUAL RASH, SWELLING OR PAIN  UNUSUAL VAGINAL DISCHARGE OR ITCHING   Items with * indicate a potential emergency and should be followed up as soon as possible or go to the Emergency  Department if any problems should occur.  Please show the CHEMOTHERAPY ALERT CARD or IMMUNOTHERAPY ALERT CARD at check-in to the Emergency Department and triage nurse.  Should you have questions after your visit or need to cancel or reschedule your appointment, please contact [facility] [place] - A DEPT OF [facility] [phone]  and follow the prompts.  Office hours are 8:00 a.m. to 4:30 p.m. Monday - Friday. Please note that voicemails left after 4:00 p.m. may not be returned until the following business day.  We are closed weekends and major holidays. You have access to a nurse at all times for urgent questions. Please call the main number to the clinic [phone] and follow the prompts.  For any non-urgent questions, you may also contact your provider using MyChart. We now offer e-Visits for anyone 88 and older to request care online for non-urgent symptoms. For details visit [email or web address].   Also download the MyChart app! Go to the app store, search "MyChart", open the app, select [place], and log in with your MyChart username and password.

## 2023-10-31 ENCOUNTER — Encounter: Payer: Self-pay | Admitting: Oncology

## 2023-10-31 ENCOUNTER — Inpatient Hospital Stay

## 2023-10-31 VITALS — BP 131/76 | HR 58 | Temp 97.2°F | Resp 18

## 2023-10-31 DIAGNOSIS — Z5111 Encounter for antineoplastic chemotherapy: Secondary | ICD-10-CM | POA: Diagnosis not present

## 2023-10-31 DIAGNOSIS — Z95828 Presence of other vascular implants and grafts: Secondary | ICD-10-CM

## 2023-10-31 DIAGNOSIS — D469 Myelodysplastic syndrome, unspecified: Secondary | ICD-10-CM

## 2023-10-31 MED ORDER — SODIUM CHLORIDE 0.9 % IV SOLN
75.0000 mg/m2 | Freq: Once | INTRAVENOUS | Status: AC
  Administered 2023-10-31: 140 mg via INTRAVENOUS
  Filled 2023-10-31: qty 14

## 2023-10-31 MED ORDER — SODIUM CHLORIDE 0.9 % IV SOLN
Freq: Once | INTRAVENOUS | Status: AC
Start: 2023-10-31 — End: 2023-10-31

## 2023-10-31 MED ORDER — ALTEPLASE 2 MG IJ SOLR
2.0000 mg | Freq: Once | INTRAMUSCULAR | Status: AC
  Administered 2023-10-31: 2 mg
  Filled 2023-10-31: qty 2

## 2023-10-31 MED ORDER — PALONOSETRON HCL INJECTION 0.25 MG/5ML
0.2500 mg | Freq: Once | INTRAVENOUS | Status: AC
  Administered 2023-10-31: 0.25 mg via INTRAVENOUS
  Filled 2023-10-31: qty 5

## 2023-10-31 MED ORDER — DEXAMETHASONE SODIUM PHOSPHATE 10 MG/ML IJ SOLN
10.0000 mg | Freq: Once | INTRAMUSCULAR | Status: AC
  Administered 2023-10-31: 10 mg via INTRAVENOUS
  Filled 2023-10-31: qty 1

## 2023-10-31 NOTE — Patient Instructions (Signed)
 [facility] [place] - A DEPT OF [facility]  Discharge Instructions: Thank you for choosing [facility] to provide your oncology and hematology care.  If you have a lab appointment with the Cancer Center - please note that after April 8th, 2024, all labs will be drawn in the cancer center.  You do not have to check in or register with the main entrance as you have in the past but will complete your check-in in the cancer center.  Wear comfortable clothing and clothing appropriate for easy access to any Portacath or PICC line.   We strive to give you quality time with your provider. You may need to reschedule your appointment if you arrive late (15 or more minutes).  Arriving late affects you and other patients whose appointments are after yours.  Also, if you miss three or more appointments without notifying the office, you may be dismissed from the clinic at the provider's discretion.      For prescription refill requests, have your pharmacy contact our office and allow 72 hours for refills to be completed.    Today you received the following chemotherapy and/or immunotherapy agents Vidaza , return as scheduled.   To help prevent nausea and vomiting after your treatment, we encourage you to take your nausea medication as directed.  BELOW ARE SYMPTOMS THAT SHOULD BE REPORTED IMMEDIATELY: *FEVER GREATER THAN 100.4 F (38 C) OR HIGHER *CHILLS OR SWEATING *NAUSEA AND VOMITING THAT IS NOT CONTROLLED WITH YOUR NAUSEA MEDICATION *UNUSUAL SHORTNESS OF BREATH *UNUSUAL BRUISING OR BLEEDING *URINARY PROBLEMS (pain or burning when urinating, or frequent urination) *BOWEL PROBLEMS (unusual diarrhea, constipation, pain near the anus) TENDERNESS IN MOUTH AND THROAT WITH OR WITHOUT PRESENCE OF ULCERS (sore throat, sores in mouth, or a toothache) UNUSUAL RASH, SWELLING OR PAIN  UNUSUAL VAGINAL DISCHARGE OR ITCHING   Items with * indicate a potential emergency and  should be followed up as soon as possible or go to the Emergency Department if any problems should occur.  Please show the CHEMOTHERAPY ALERT CARD or IMMUNOTHERAPY ALERT CARD at check-in to the Emergency Department and triage nurse.  Should you have questions after your visit or need to cancel or reschedule your appointment, please contact [facility] [place] - A DEPT OF [facility] [phone]  and follow the prompts.  Office hours are 8:00 a.m. to 4:30 p.m. Monday - Friday. Please note that voicemails left after 4:00 p.m. may not be returned until the following business day.  We are closed weekends and major holidays. You have access to a nurse at all times for urgent questions. Please call the main number to the clinic [phone] and follow the prompts.  For any non-urgent questions, you may also contact your provider using MyChart. We now offer e-Visits for anyone 45 and older to request care online for non-urgent symptoms. For details visit [email or web address].   Also download the MyChart app! Go to the app store, search MyChart, open the app, select [place], and log in with your MyChart username and password.

## 2023-10-31 NOTE — Progress Notes (Signed)
 No blood return noted from port. Alteplase  placed. Patient agreeable to start peripheral IV to run treatment. Patient tolerated chemotherapy with no complaints voiced. Side effects with management reviewed understanding verbalized. IV site clean and dry with no bruising or swelling noted at site. Good blood return noted before and after administration of chemotherapy. Band aid applied. Patient left in satisfactory condition with VSS and no s/s of distress noted.

## 2023-10-31 NOTE — Progress Notes (Signed)
   10/31/23 1200  Spiritual Encounters  Type of Visit Follow up  Care provided to: Patient  Conversation partners present during encounter Other (comment) (another Pt)  Referral source Chaplain assessment  Reason for visit Routine spiritual support  OnCall Visit No  Spiritual Framework  Presenting Themes Meaning/purpose/sources of inspiration;Goals in life/care;Coping tools  Community/Connection Family;Faith community  Patient Stress Factors None identified  Interventions  Spiritual Care Interventions Made Compassionate presence;Reflective listening;Explored values/beliefs/practices/strengths;Encouragement  Intervention Outcomes  Outcomes Awareness of support  Spiritual Care Plan  Spiritual Care Issues Still Outstanding Chaplain will continue to follow   Reason for Visit: Chaplain making rounds on the floor visiting infusion Pts and discovered that Pt I was scheduled to call this afternoon was actually in the clinic for treatment today.  Description of Visit: Arriving in the room I found Chad Lamb in a recliner chair having finished his treatment, talking with another Pt who is known to me.  I entered the room and joined the conversation.  Solace and the other Pt shared how they have been connected through this clinic for at least 3 years.  They both spoke of the close-knot feel of the Cancer Center. Chad Lamb shared with us  some pictures of some sculptures he has made in an art class. This class is one of his self-care/coping tools.  He seemed to enjoy the art process as well as being proud of the final product.  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  He is excited to be able to "give-back" but he states that his wife is not fully supportive as she worries he might be exposed to the "underbelly of healthcare" and get disillusioned with it.  Plan of Care: At this point I will continue to follow [place] journey, not  because he needs the extra support but because staying in touch with spiritual care seems important to him.   Maude Roll, MDiv  Chaplain, [facility] [email or web address] [phone]

## 2023-12-01 ENCOUNTER — Other Ambulatory Visit: Payer: Self-pay

## 2023-12-01 ENCOUNTER — Other Ambulatory Visit (HOSPITAL_COMMUNITY): Payer: Self-pay

## 2023-12-01 ENCOUNTER — Other Ambulatory Visit: Payer: Self-pay | Admitting: Oncology

## 2023-12-01 DIAGNOSIS — Z95828 Presence of other vascular implants and grafts: Secondary | ICD-10-CM

## 2023-12-01 DIAGNOSIS — D469 Myelodysplastic syndrome, unspecified: Secondary | ICD-10-CM

## 2023-12-01 NOTE — Progress Notes (Signed)
   12/01/23 1300  Spiritual Encounters  Type of Visit Follow up  Care provided to: Patient  Referral source Chaplain assessment  Reason for visit Routine spiritual support   Chaplain attempted to follow-up with Pt via telephone for some routine spiritual support as I know this Pt appreciates being checked-in on.  Pt did not answer home phone but I did leave a message stating that I was calling to check-in and that I would plan to see Pt at a visit later in the month.  Maude Roll, MDiv  Chaplain, [facility] [email or web address] [phone]

## 2023-12-01 NOTE — Progress Notes (Signed)
 Specialty Pharmacy Refill Coordination Note  Spoke with Chad Lamb is a 78 y.o. male contacted today regarding refills of specialty medication(s) Venetoclax  (VENCLEXTA )  Doses on hand: 0, next cycle approx 12/08/23  Patient requested: Delivery   Delivery date: 12/04/23   Verified address: [address] [place] [address]  Medication will be filled on 12/03/23.

## 2023-12-02 ENCOUNTER — Other Ambulatory Visit: Payer: Self-pay

## 2023-12-06 ENCOUNTER — Other Ambulatory Visit: Payer: Self-pay

## 2023-12-08 ENCOUNTER — Inpatient Hospital Stay: Attending: Hematology

## 2023-12-08 ENCOUNTER — Encounter: Payer: Self-pay | Admitting: Oncology

## 2023-12-08 ENCOUNTER — Inpatient Hospital Stay (HOSPITAL_BASED_OUTPATIENT_CLINIC_OR_DEPARTMENT_OTHER): Admitting: Oncology

## 2023-12-08 ENCOUNTER — Inpatient Hospital Stay

## 2023-12-08 VITALS — BP 129/83 | HR 55 | Temp 96.6°F | Resp 18

## 2023-12-08 DIAGNOSIS — D709 Neutropenia, unspecified: Secondary | ICD-10-CM

## 2023-12-08 DIAGNOSIS — K5900 Constipation, unspecified: Secondary | ICD-10-CM | POA: Diagnosis not present

## 2023-12-08 DIAGNOSIS — D4622 Refractory anemia with excess of blasts 2: Secondary | ICD-10-CM | POA: Insufficient documentation

## 2023-12-08 DIAGNOSIS — I82A12 Acute embolism and thrombosis of left axillary vein: Secondary | ICD-10-CM | POA: Diagnosis not present

## 2023-12-08 DIAGNOSIS — Z95828 Presence of other vascular implants and grafts: Secondary | ICD-10-CM

## 2023-12-08 DIAGNOSIS — Z5111 Encounter for antineoplastic chemotherapy: Secondary | ICD-10-CM | POA: Insufficient documentation

## 2023-12-08 DIAGNOSIS — D469 Myelodysplastic syndrome, unspecified: Secondary | ICD-10-CM

## 2023-12-08 DIAGNOSIS — R21 Rash and other nonspecific skin eruption: Secondary | ICD-10-CM

## 2023-12-08 LAB — CBC WITH DIFFERENTIAL/PLATELET
Abs Immature Granulocytes: 0.02 K/uL (ref 0.00–0.07)
Basophils Absolute: 0 K/uL (ref 0.0–0.1)
Basophils Relative: 1 %
Eosinophils Absolute: 0.6 K/uL — ABNORMAL HIGH (ref 0.0–0.5)
Eosinophils Relative: 11 %
HCT: 37.5 % — ABNORMAL LOW (ref 39.0–52.0)
Hemoglobin: 12.2 g/dL — ABNORMAL LOW (ref 13.0–17.0)
Immature Granulocytes: 0 %
Lymphocytes Relative: 21 %
Lymphs Abs: 1.2 K/uL (ref 0.7–4.0)
MCH: 27.1 pg (ref 26.0–34.0)
MCHC: 32.5 g/dL (ref 30.0–36.0)
MCV: 83.1 fL (ref 80.0–100.0)
Monocytes Absolute: 0.8 K/uL (ref 0.1–1.0)
Monocytes Relative: 14 %
Neutro Abs: 3.2 K/uL (ref 1.7–7.7)
Neutrophils Relative %: 53 %
Platelets: 219 K/uL (ref 150–400)
RBC: 4.51 MIL/uL (ref 4.22–5.81)
RDW: 17.8 % — ABNORMAL HIGH (ref 11.5–15.5)
WBC: 5.9 K/uL (ref 4.0–10.5)
nRBC: 0 % (ref 0.0–0.2)

## 2023-12-08 LAB — COMPREHENSIVE METABOLIC PANEL WITH GFR
ALT: 15 U/L (ref 0–44)
AST: 21 U/L (ref 15–41)
Albumin: 3.7 g/dL (ref 3.5–5.0)
Alkaline Phosphatase: 72 U/L (ref 38–126)
Anion gap: 11 (ref 5–15)
BUN: 16 mg/dL (ref 8–23)
CO2: 24 mmol/L (ref 22–32)
Calcium: 9.4 mg/dL (ref 8.9–10.3)
Chloride: 107 mmol/L (ref 98–111)
Creatinine, Ser: 0.95 mg/dL (ref 0.61–1.24)
GFR, Estimated: >60 mL/min (ref >60)
Glucose, Bld: 132 mg/dL — ABNORMAL HIGH (ref 70–99)
Potassium: 3.6 mmol/L (ref 3.5–5.1)
Sodium: 142 mmol/L (ref 135–145)
Total Bilirubin: 1.2 mg/dL (ref 0.0–1.2)
Total Protein: 6.2 g/dL — ABNORMAL LOW (ref 6.5–8.1)

## 2023-12-08 LAB — MAGNESIUM: Magnesium: 2.2 mg/dL (ref 1.7–2.4)

## 2023-12-08 MED ORDER — SODIUM CHLORIDE 0.9 % IV SOLN
75.0000 mg/m2 | Freq: Once | INTRAVENOUS | Status: AC
  Administered 2023-12-08: 140 mg via INTRAVENOUS
  Filled 2023-12-08: qty 14

## 2023-12-08 MED ORDER — DEXAMETHASONE SODIUM PHOSPHATE 10 MG/ML IJ SOLN
10.0000 mg | Freq: Once | INTRAMUSCULAR | Status: AC
  Administered 2023-12-08: 10 mg via INTRAVENOUS
  Filled 2023-12-08: qty 1

## 2023-12-08 MED ORDER — PALONOSETRON HCL INJECTION 0.25 MG/5ML
0.2500 mg | Freq: Once | INTRAVENOUS | Status: AC
  Administered 2023-12-08: 0.25 mg via INTRAVENOUS
  Filled 2023-12-08: qty 5

## 2023-12-08 MED ORDER — SODIUM CHLORIDE 0.9 % IV SOLN: Freq: Once | INTRAVENOUS | Status: AC

## 2023-12-08 NOTE — Assessment & Plan Note (Addendum)
 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 Had good response with azacitidine  and venetoclax  Currently on azacitidine  days 1-5 and venetoclax  200 mg days 1-14 of a 42-day cycle Last bone marrow biopsy was from March 2024 with very good response  - Labs reviewed today: CMP: Normal creatinine and normal LFTs.  CBC: Normal WBC, hemoglobin 12.2, platelets: 219 - Physical exam stable.  Proceed with azacitidine  today. - Continue venetoclax  200 mg days 1-14 of a 42-day cycle.   - Will consider bone marrow biopsy if there is significant change in the counts  Return to clinic in 6 weeks before next cycle

## 2023-12-08 NOTE — Progress Notes (Signed)
 Patient Care Team: [name], MD as PCP - General (Family Medicine) [name], MD (Family Medicine) Harvey Margo CROME, MD (Inactive) as Consulting Physician (Gastroenterology) Davonna Siad, MD as Consulting Physician (Oncology)  Clinic Day:  12/08/2023  Referring physician: [name], MD   CHIEF COMPLAINT:  CC: MDS  ASSESSMENT & PLAN:   Assessment & Plan: Chad Lamb  is a 78 y.o. 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 Had good response with azacitidine  and venetoclax  Currently on azacitidine  days 1-5 and venetoclax  200 mg days 1-14 of a 42-day cycle Last bone marrow biopsy was from March 2024 with very good response  - Labs reviewed today: CMP: Normal creatinine and normal LFTs.  CBC: Normal WBC, hemoglobin 12.2, platelets: 219 - Physical exam stable.  Proceed with azacitidine  today. - Continue venetoclax  200 mg days 1-14 of a 42-day cycle.   - Will consider bone marrow biopsy if there is significant change in the counts  Return to clinic in 6 weeks before next cycle Acute deep vein thrombosis (DVT) of axillary vein of left upper extremity (HCC) Patient had an unprovoked clot previously Was on a prolonged period of Xarelto  but was discontinued for hemorrhoidal bleeding  - No evidence of recurrent clot Neutropenia, unspecified type (HCC) Likely secondary to venetoclax   Normal WBC today  - Will continue to monitor counts at this time - Will consider bone marrow biopsy if it worsens. Constipation, unspecified constipation type Patient reports constipation during cycles.  - Continue Colace as prescribed and MiraLAX as needed Rash Patient has erythematous not papular rash on arms, hand, suprapubic area. Evaluated by dermatologist and was nonmalignant  - Improved with hydrocortisone  cream - Continue the hydrocortisone  cream twice a day    The patient  understands the plans discussed today and is in agreement with them.  He knows to contact our office if he develops concerns prior to his next appointment.  20 minutes of total time was spent for this patient encounter, including preparation, face-to-face counseling with the patient and coordination of care, physical exam, and documentation of the encounter.   Siad Davonna, MD  [facility] [place] - A DEPT OF [facility] [address] [place] [address] Dept: [phone] Dept Fax: [phone]   No orders of the defined types were placed in this encounter.    ONCOLOGY HISTORY:   I have reviewed his chart and materials related to his cancer extensively and collaborated history with the patient. Summary of oncologic history is as follows:  Myelodysplastic syndrome- EB2:  -01/05/2021: Presented with worsening leukopenia and thrombocytopenia - 06/04/2021: Flow cytometry: Increased metaplastic cells (10%).  Hypercellular bone marrow with MDS with excess blasts (MDS-EB-2).  Cellularity 50 to 70%.  Absent ring sideroblasts.  MDS FISH: Normal.  Normal male karyotype. IPSS-M score: 0.22- moderate high risk. - 06/18/2021: Mutations detected: ASXL1 - 07/30/2021-04/02/2022: Azacitidine  (days 1-7) every 28 days x 6 cycles - 01/04/2022: Flow cytometry: 3.3% CD4 positive myeloblasts.  Bone marrow consistent with persistent MDS.  Cellularity 40%.  Consistent with MDS with increased blasts. Absent ring sideroblasts normal MDS FISH.  Normal male karyotype. - 02/18/2022: Added Venetoclax  200mg  2 weeks on 2 weeks off - 06/05/2022: Flow cytometric: No significant CD34 positive blasts.  Bone marrow: Slightly hypercellular bone marrow for age.  Consistent with residual MDS.  Absent ring sideroblasts.  Normal male  karyotype. -04/29/2022- Current: Decreased azacitidine  to 5 days and continued venetoclax  200 mg days 1-14 of a 42-day cycle  Current  Treatment: Azacitidine  (Days 1-5) + Venetoclax  200mg  days 1-14 of a 42 day cycle.    INTERVAL HISTORY:   Chad Lamb is here today for follow up and transition of care.  He is currently on venetoclax  and has no issues with the medication, such as diarrhea, numbness, or tingling.  He experiences persistent constipation, which he associates with his ongoing treatment. He manages this with Colace and Miralax, taking Colace one pill daily and using Miralax daily, which effectively manages his symptoms. No diarrhea, numbness, or tingling.  A rash, previously located under his arm, has mostly resolved. A biopsy was performed by his dermatologist when the rash first appeared, and the results were unremarkable.  His mood is occasionally low, which he attributes to the changing seasons as winter approaches. He describes this as a normal occurrence for him.  I have reviewed the past medical history, past surgical history, social history and family history with the patient and they are unchanged from previous note.  ALLERGIES:  is allergic to cephalosporins, amoxapine and related, amoxicillin, cephalexin, clindamycin/lincomycin, sulfamethoxazole, and trimethoprim.  MEDICATIONS:  Current Outpatient Medications  Medication Sig Dispense Refill   albuterol  (PROVENTIL  HFA;VENTOLIN  HFA) 108 (90 Base) MCG/ACT inhaler Inhale 1-2 puffs into the lungs every 6 (six) hours as needed for wheezing or shortness of breath.     ALPRAZolam (XANAX) 0.5 MG tablet Take 0.5 mg by mouth 2 (two) times daily as needed for anxiety.     amLODipine (NORVASC) 2.5 MG tablet Take 2.5 mg by mouth daily.     azaCITIDine  5 mg/2 mLs in lactated ringers  infusion Inject into the vein daily. Days 1-7 every 28 days     DENTA 5000 PLUS 1.1 % CREA dental cream Take by mouth.     docusate sodium (COLACE) 100 MG capsule 1 capsule Orally daily (3 daily when on chemo)     fluticasone  furoate-vilanterol (BREO ELLIPTA) 200-25 MCG/ACT AEPB  Inhale 1 puff into the lungs daily.     Lactulose  20 GM/30ML SOLN Take 30 mLs (20 g total) by mouth at bedtime. 473 mL 3   Lidocaine -Hydrocort , Perianal, 3-0.5 % CREA Apply topically 2 (two) times daily as needed.     Lidocaine -Hydrocortisone  Ace 3-0.5 % CREA Apply 1 application topically as directed. 30 Tube 11   lidocaine -prilocaine  (EMLA ) cream Apply a SMALL AMOUNT TO port a cath site (DO not RUB in) AND cover with PLASTIC WRAP 1 hour prior TO infusion appointment 30 g 3   loratadine (CLARITIN) 10 MG tablet Take 10 mg by mouth daily.     Lutein 40 MG CAPS Take 40 mg by mouth daily.     Multiple Vitamins-Minerals (CENTRUM ADULTS PO) Take 1 tablet by mouth daily.     nystatin cream (MYCOSTATIN) Apply 1 application. topically 2 (two) times daily as needed for dry skin.     ofloxacin (OCUFLOX) 0.3 % ophthalmic solution Place 1 drop into the left eye See admin instructions. 1 drop to left eye 4 x daily for 7 days after monthly procedure.     omeprazole (PRILOSEC OTC) 20 MG tablet Take 5-10 mg by mouth daily.     prochlorperazine  (COMPAZINE ) 10 MG tablet Take 1 tablet (10 mg total) by mouth every 6 (six) hours as needed (Nausea or vomiting). 30 tablet 2   sildenafil (VIAGRA) 50 MG tablet Take 50 mg by mouth daily  as needed for erectile dysfunction.     traMADol  (ULTRAM ) 50 MG tablet Take 1 tablet (50 mg total) by mouth every 6 (six) hours as needed. 15 tablet 0   venetoclax  (VENCLEXTA ) 100 MG tablet Take 2 tablets (200 mg total) by mouth daily. Take for 14 days, then hold for 28 days. Repeat every 42 days. Take with a meal and a full glass of water . 28 tablet 1   No current facility-administered medications for this visit.   Facility-Administered Medications Ordered in Other Visits  Medication Dose Route Frequency Provider Last Rate Last Admin   sodium chloride  flush (NS) 0.9 % injection 10 mL  10 mL Intracatheter PRN Rogers Hai, MD   10 mL at 06/12/22 1606    REVIEW OF SYSTEMS:    Constitutional: Denies fevers, chills or abnormal weight loss Eyes: Denies blurriness of vision Ears, nose, mouth, throat, and face: Denies mucositis or sore throat Respiratory: Denies cough, dyspnea or wheezes Cardiovascular: Denies palpitation, chest discomfort or lower extremity swelling Gastrointestinal:  Denies nausea, heartburn or change in bowel habits Skin: Denies abnormal skin rashes Lymphatics: Denies new lymphadenopathy or easy bruising Neurological:Denies numbness, tingling or new weaknesses Behavioral/Psych: Mood is stable, no new changes  All other systems were reviewed with the patient and are negative.   VITALS:  There were no vitals taken for this visit.  Wt Readings from Last 3 Encounters:  12/08/23 152 lb (68.9 kg)  10/27/23 150 lb 12.8 oz (68.4 kg)  10/23/23 149 lb 0.5 oz (67.6 kg)    There is no height or weight on file to calculate BMI.  Performance status (ECOG): 1 - Symptomatic but completely ambulatory  PHYSICAL EXAM:   GENERAL:alert, no distress and comfortable SKIN: skin color, texture, turgor are normal, no rashes or significant lesions LYMPH:  no palpable lymphadenopathy in the cervical, axillary or inguinal LUNGS: clear to auscultation and percussion with normal breathing effort HEART: regular rate & rhythm and no murmurs and no lower extremity edema ABDOMEN:abdomen soft, non-tender and normal bowel sounds Musculoskeletal:no cyanosis of digits and no clubbing  NEURO: alert & oriented x 3 with fluent speech, no focal motor/sensory deficits  LABORATORY DATA:  I have reviewed the data as listed    Component Value Date/Time   NA 142 12/08/2023 0946   K 3.6 12/08/2023 0946   CL 107 12/08/2023 0946   CO2 24 12/08/2023 0946   GLUCOSE 132 (H) 12/08/2023 0946   BUN 16 12/08/2023 0946   CREATININE 0.95 12/08/2023 0946   CREATININE 0.98 02/12/2017 1421   CALCIUM 9.4 12/08/2023 0946   PROT 6.2 (L) 12/08/2023 0946   ALBUMIN 3.7 12/08/2023 0946    AST 21 12/08/2023 0946   ALT 15 12/08/2023 0946   ALKPHOS 72 12/08/2023 0946   BILITOT 1.2 12/08/2023 0946   GFRNONAA >60 12/08/2023 0946   GFRNONAA 77 02/12/2017 1421   GFRAA >60 11/19/2017 1532   GFRAA 89 02/12/2017 1421     Lab Results  Component Value Date   WBC 5.9 12/08/2023   NEUTROABS 3.2 12/08/2023   HGB 12.2 (L) 12/08/2023   HCT 37.5 (L) 12/08/2023   MCV 83.1 12/08/2023   PLT 219 12/08/2023      Chemistry      Component Value Date/Time   NA 142 12/08/2023 0946   K 3.6 12/08/2023 0946   CL 107 12/08/2023 0946   CO2 24 12/08/2023 0946   BUN 16 12/08/2023 0946   CREATININE 0.95 12/08/2023 0946   CREATININE 0.98  02/12/2017 1421      Component Value Date/Time   CALCIUM 9.4 12/08/2023 0946   ALKPHOS 72 12/08/2023 0946   AST 21 12/08/2023 0946   ALT 15 12/08/2023 0946   BILITOT 1.2 12/08/2023 0946       RADIOGRAPHIC STUDIES: I have personally reviewed the radiological images as listed and agreed with the findings in the report.  None new to review

## 2023-12-08 NOTE — Assessment & Plan Note (Addendum)
 Patient has erythematous not papular rash on arms, hand, suprapubic area. Evaluated by dermatologist and was nonmalignant  - Improved with hydrocortisone  cream - Continue the hydrocortisone  cream twice a day

## 2023-12-08 NOTE — Assessment & Plan Note (Addendum)
 Patient reports constipation during cycles.  - Continue Colace as prescribed and MiraLAX as needed

## 2023-12-08 NOTE — Patient Instructions (Signed)

## 2023-12-08 NOTE — Patient Instructions (Signed)
 [facility] [place] - A DEPT OF [facility]  Discharge Instructions: Thank you for choosing [facility] to provide your oncology and hematology care.  If you have a lab appointment with the Cancer Center - please note that after April 8th, 2024, all labs will be drawn in the cancer center.  You do not have to check in or register with the main entrance as you have in the past but will complete your check-in in the cancer center.  Wear comfortable clothing and clothing appropriate for easy access to any Portacath or PICC line.   We strive to give you quality time with your provider. You may need to reschedule your appointment if you arrive late (15 or more minutes).  Arriving late affects you and other patients whose appointments are after yours.  Also, if you miss three or more appointments without notifying the office, you may be dismissed from the clinic at the provider's discretion.      For prescription refill requests, have your pharmacy contact our office and allow 72 hours for refills to be completed.    Today you received the following chemotherapy and/or immunotherapy agents D1 Vidaza       To help prevent nausea and vomiting after your treatment, we encourage you to take your nausea medication as directed.  Azacitidine  Injection What is this medication? AZACITIDINE  (ay [employer] i deen) treats blood and bone marrow cancers. It works by slowing down the growth of cancer cells. This medicine may be used for other purposes; ask your health care provider or pharmacist if you have questions. COMMON BRAND NAME(S): Vidaza  What should I tell my care team before I take this medication? They need to know if you have any of these conditions: Kidney disease Liver disease Low blood cell levels, such as low white cells, platelets, or red blood cells Low levels of albumin in the blood Low levels of bicarbonate in the blood An unusual or allergic reaction to  azacitidine , mannitol, other medications, foods, dyes, or preservatives If you or your partner are pregnant or trying to get pregnant Breast-feeding How should I use this medication? This medication is injected into a vein or under the skin. It is given by your care team in a hospital or clinic setting. Talk to your care team about the use of this medication in children. While it may be prescribed for children as young as 1 month for selected conditions, precautions do apply. Overdosage: If you think you have taken too much of this medicine contact a poison control center or emergency room at once. NOTE: This medicine is only for you. Do not share this medicine with others. What if I miss a dose? Keep appointments for follow-up doses. It is important not to miss your dose. Call your care team if you are unable to keep an appointment. What may interact with this medication? Interactions are not expected. This list may not describe all possible interactions. Give your health care provider a list of all the medicines, herbs, non-prescription drugs, or dietary supplements you use. Also tell them if you smoke, drink alcohol, or use illegal drugs. Some items may interact with your medicine. What should I watch for while using this medication? Your condition will be monitored carefully while you are receiving this medication. This medication may make you feel generally unwell. This is not uncommon as chemotherapy can affect healthy cells as well as cancer cells. Report any side effects. Continue your  course of treatment even though you feel ill unless your care team tells you to stop. You may need blood work done while you are taking this medication. Other product types may be available that contain the medication azacitidine . The injection and oral products should not be used in place of one another. Talk to your care team if you have questions. This medication can cause serious side effects. To reduce  the risk, your care team may give you other medications to take before receiving this one. Be sure to follow the directions from your care team. This medication may increase your risk of getting an infection. Call your care team for advice if you get a fever, chills, sore throat, or other symptoms of a cold or flu. Do not treat yourself. Try to avoid being around people who are sick. Avoid taking medications that contain aspirin, acetaminophen , ibuprofen, naproxen, or ketoprofen unless instructed by your care team. These medications may hide a fever. Be careful brushing or flossing your teeth or using a toothpick because you may get an infection or bleed more easily. If you have any dental work done, tell your dentist you are receiving this medication. Talk to your care team if you or your partner may be pregnant. Serious birth defects can occur if you take this medication during pregnancy and for 6 months after the last dose. You will need a negative pregnancy test before starting this medication. Contraception is recommended while taking this medication and for 6 months after the last dose. Your care team can help you find the option that works for you. If your partner can get pregnant, use a condom during sex while taking this medication and for 3 months after the last dose. Do not breastfeed while taking this medication and for 1 week after the last dose. This medication may cause infertility. Talk to your care team if you are concerned about your fertility. What side effects may I notice from receiving this medication? Side effects that you should report to your care team as soon as possible: Allergic reactions--skin rash, itching, hives, swelling of the face, lips, tongue, or throat Infection--fever, chills, cough, sore throat, wounds that don't heal, pain or trouble when passing urine, general feeling of discomfort or being unwell Kidney injury--decrease in the amount of urine, swelling of the  ankles, hands, or feet Liver injury--right upper belly pain, loss of appetite, nausea, light-colored stool, dark yellow or brown urine, yellowing skin or eyes, unusual weakness or fatigue Low red blood cell level--unusual weakness or fatigue, dizziness, headache, trouble breathing Tumor lysis syndrome (TLS)--nausea, vomiting, diarrhea, decrease in the amount of urine, dark urine, unusual weakness or fatigue, confusion, muscle pain or cramps, fast or irregular heartbeat, joint pain Unusual bruising or bleeding Side effects that usually do not require medical attention (report to your care team if they continue or are bothersome): Constipation Diarrhea Nausea Pain, redness, or irritation at injection site Vomiting This list may not describe all possible side effects. Call your doctor for medical advice about side effects. You may report side effects to FDA at 1-800-FDA-1088. Where should I keep my medication? This medication is given in a hospital or clinic. It will not be stored at home. NOTE: This sheet is a summary. It may not cover all possible information. If you have questions about this medicine, talk to your doctor, pharmacist, or health care provider.  2024 Elsevier/Gold Standard (2022-11-04 00:00:00)  BELOW ARE SYMPTOMS THAT SHOULD BE REPORTED IMMEDIATELY: *FEVER GREATER THAN 100.4  F (38 C) OR HIGHER *CHILLS OR SWEATING *NAUSEA AND VOMITING THAT IS NOT CONTROLLED WITH YOUR NAUSEA MEDICATION *UNUSUAL SHORTNESS OF BREATH *UNUSUAL BRUISING OR BLEEDING *URINARY PROBLEMS (pain or burning when urinating, or frequent urination) *BOWEL PROBLEMS (unusual diarrhea, constipation, pain near the anus) TENDERNESS IN MOUTH AND THROAT WITH OR WITHOUT PRESENCE OF ULCERS (sore throat, sores in mouth, or a toothache) UNUSUAL RASH, SWELLING OR PAIN  UNUSUAL VAGINAL DISCHARGE OR ITCHING   Items with * indicate a potential emergency and should be followed up as soon as possible or go to the  Emergency Department if any problems should occur.  Please show the CHEMOTHERAPY ALERT CARD or IMMUNOTHERAPY ALERT CARD at check-in to the Emergency Department and triage nurse.  Should you have questions after your visit or need to cancel or reschedule your appointment, please contact [facility]  - A DEPT OF [facility] [phone]  and follow the prompts.  Office hours are 8:00 a.m. to 4:30 p.m. Monday - Friday. Please note that voicemails left after 4:00 p.m. may not be returned until the following business day.  We are closed weekends and major holidays. You have access to a nurse at all times for urgent questions. Please call the main number to the clinic [phone] and follow the prompts.  For any non-urgent questions, you may also contact your provider using MyChart. We now offer e-Visits for anyone 3 and older to request care online for non-urgent symptoms. For details visit [email or web address].   Also download the MyChart app! Go to the app store, search MyChart, open the app, select [facility], and log in with your MyChart username and password.

## 2023-12-08 NOTE — Assessment & Plan Note (Addendum)
 Likely secondary to venetoclax   Normal WBC today  - Will continue to monitor counts at this time - Will consider bone marrow biopsy if it worsens.

## 2023-12-08 NOTE — Assessment & Plan Note (Addendum)
 Patient had an unprovoked clot previously Was on a prolonged period of Xarelto  but was discontinued for hemorrhoidal bleeding  - No evidence of recurrent clot

## 2023-12-08 NOTE — Progress Notes (Signed)
 Patient presents today for D1 Vidaza  infusion. Patient is in satisfactory condition with no new complaints voiced.  Vital signs are stable.  Labs reviewed by Dr. Davonna during the office visit and all labs are within treatment parameters.  We will proceed with treatment per MD orders.   Treatment given today per MD orders. Tolerated infusion without adverse affects. Vital signs stable. No complaints at this time. Discharged from clinic ambulatory in stable condition. Alert and oriented x 3. F/U with [facility] as scheduled.

## 2023-12-08 NOTE — Progress Notes (Signed)
 Patient is taking venetoclax  as prescribed. He has not missed any doses and reports no side effects at this time.    Patient has been examined by Dr. Davonna. Vital signs and labs have been reviewed by MD - ANC, Creatinine, LFTs, hemoglobin, and platelets have been reviewed by M.D. - pt may proceed with treatment.  Primary RN and pharmacy notified.

## 2023-12-09 ENCOUNTER — Inpatient Hospital Stay

## 2023-12-09 ENCOUNTER — Ambulatory Visit

## 2023-12-09 ENCOUNTER — Encounter: Payer: Self-pay | Admitting: Oncology

## 2023-12-09 VITALS — BP 154/87 | HR 64 | Temp 97.4°F | Resp 18

## 2023-12-09 DIAGNOSIS — D469 Myelodysplastic syndrome, unspecified: Secondary | ICD-10-CM

## 2023-12-09 DIAGNOSIS — Z5111 Encounter for antineoplastic chemotherapy: Secondary | ICD-10-CM | POA: Diagnosis not present

## 2023-12-09 DIAGNOSIS — Z95828 Presence of other vascular implants and grafts: Secondary | ICD-10-CM

## 2023-12-09 MED ORDER — DEXAMETHASONE SODIUM PHOSPHATE 10 MG/ML IJ SOLN
10.0000 mg | Freq: Once | INTRAMUSCULAR | Status: AC
  Administered 2023-12-09: 10 mg via INTRAVENOUS
  Filled 2023-12-09: qty 1

## 2023-12-09 MED ORDER — SODIUM CHLORIDE 0.9 % IV SOLN: Freq: Once | INTRAVENOUS | Status: AC

## 2023-12-09 MED ORDER — SODIUM CHLORIDE 0.9 % IV SOLN
75.0000 mg/m2 | Freq: Once | INTRAVENOUS | Status: AC
  Administered 2023-12-09: 140 mg via INTRAVENOUS
  Filled 2023-12-09: qty 14

## 2023-12-09 NOTE — Progress Notes (Signed)
   12/09/23 1046  Spiritual Encounters  Type of Visit Follow up  Care provided to: Patient  Referral source Chaplain assessment  Reason for visit Routine spiritual support  Spiritual Framework  Presenting Themes Meaning/purpose/sources of inspiration;Goals in life/care;Impactful experiences and emotions;Rituals and practive  Community/Connection Family;Faith community  Interventions  Spiritual Care Interventions Made Compassionate presence;Reflective listening;Encouragement  Intervention Outcomes  Outcomes Connection to spiritual care;Awareness of support  Spiritual Care Plan  Spiritual Care Issues Still Outstanding Chaplain will continue to follow   with. Description of Visit: I entered the room and found Chad Lamb sitting in the chair receiving his treatment, with no support person present.  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  He would need a new shot but because of his current health they can't give it to him.  Chad Lamb was disappointed by this, I think more than he let on.  We processed this disappointment together.  Chad Lamb also spoke about his numbers looking good these days and that was a positive note.  He spoke also of his growing appreciate for Doctor Davonna, and he hopes she will be as proactive in his care as Dr MARLA was.  Plan of Care: I will continue to follow-up with Chad Lamb on a monthly basis.   Chad Lamb, MDiv  Chaplain, [facility] [email or web address] [phone]

## 2023-12-09 NOTE — Progress Notes (Signed)
 Patient presents today for D2 Vidaza  infusion.  Patient is in satisfactory condition with no new complaints voiced.  Vital signs are stable.  Labs reviewed by Dr.Kandala on 12/08/23 and all labs are within treatment parameters.  We will proceed with treatment per MD orders.    Treatment given today per MD orders. Tolerated infusion without adverse affects. Vital signs stable. No complaints at this time. Discharged from clinic ambulatory in stable condition. Alert and oriented x 3. F/U with [facility] as scheduled.

## 2023-12-09 NOTE — Patient Instructions (Signed)
 [facility] [place] - A DEPT OF [facility]  Discharge Instructions: Thank you for choosing [facility] to provide your oncology and hematology care.  If you have a lab appointment with the Cancer Center - please note that after April 8th, 2024, all labs will be drawn in the cancer center.  You do not have to check in or register with the main entrance as you have in the past but will complete your check-in in the cancer center.  Wear comfortable clothing and clothing appropriate for easy access to any Portacath or PICC line.   We strive to give you quality time with your provider. You may need to reschedule your appointment if you arrive late (15 or more minutes).  Arriving late affects you and other patients whose appointments are after yours.  Also, if you miss three or more appointments without notifying the office, you may be dismissed from the clinic at the provider's discretion.      For prescription refill requests, have your pharmacy contact our office and allow 72 hours for refills to be completed.    Today you received the following chemotherapy and/or immunotherapy agents D2 Vidaza    To help prevent nausea and vomiting after your treatment, we encourage you to take your nausea medication as directed.  Azacitidine  Injection What is this medication? AZACITIDINE  (ay za SITE i deen) treats blood and bone marrow cancers. It works by slowing down the growth of cancer cells. This medicine may be used for other purposes; ask your health care provider or pharmacist if you have questions. COMMON BRAND NAME(S): Vidaza  What should I tell my care team before I take this medication? They need to know if you have any of these conditions: Kidney disease Liver disease Low blood cell levels, such as low white cells, platelets, or red blood cells Low levels of albumin in the blood Low levels of bicarbonate in the blood An unusual or allergic reaction to  azacitidine , mannitol, other medications, foods, dyes, or preservatives If you or your partner are pregnant or trying to get pregnant Breast-feeding How should I use this medication? This medication is injected into a vein or under the skin. It is given by your care team in a hospital or clinic setting. Talk to your care team about the use of this medication in children. While it may be prescribed for children as young as 1 month for selected conditions, precautions do apply. Overdosage: If you think you have taken too much of this medicine contact a poison control center or emergency room at once. NOTE: This medicine is only for you. Do not share this medicine with others. What if I miss a dose? Keep appointments for follow-up doses. It is important not to miss your dose. Call your care team if you are unable to keep an appointment. What may interact with this medication? Interactions are not expected. This list may not describe all possible interactions. Give your health care provider a list of all the medicines, herbs, non-prescription drugs, or dietary supplements you use. Also tell them if you smoke, drink alcohol, or use illegal drugs. Some items may interact with your medicine. What should I watch for while using this medication? Your condition will be monitored carefully while you are receiving this medication. This medication may make you feel generally unwell. This is not uncommon as chemotherapy can affect healthy cells as well as cancer cells. Report any side effects. Continue your course of treatment  even though you feel ill unless your care team tells you to stop. You may need blood work done while you are taking this medication. Other product types may be available that contain the medication azacitidine . The injection and oral products should not be used in place of one another. Talk to your care team if you have questions. This medication can cause serious side effects. To reduce  the risk, your care team may give you other medications to take before receiving this one. Be sure to follow the directions from your care team. This medication may increase your risk of getting an infection. Call your care team for advice if you get a fever, chills, sore throat, or other symptoms of a cold or flu. Do not treat yourself. Try to avoid being around people who are sick. Avoid taking medications that contain aspirin, acetaminophen , ibuprofen, naproxen , or ketoprofen unless instructed by your care team. These medications may hide a fever. Be careful brushing or flossing your teeth or using a toothpick because you may get an infection or bleed more easily. If you have any dental work done, tell your dentist you are receiving this medication. Talk to your care team if you or your partner may be pregnant. Serious birth defects can occur if you take this medication during pregnancy and for 6 months after the last dose. You will need a negative pregnancy test before starting this medication. Contraception is recommended while taking this medication and for 6 months after the last dose. Your care team can help you find the option that works for you. If your partner can get pregnant, use a condom during sex while taking this medication and for 3 months after the last dose. Do not breastfeed while taking this medication and for 1 week after the last dose. This medication may cause infertility. Talk to your care team if you are concerned about your fertility. What side effects may I notice from receiving this medication? Side effects that you should report to your care team as soon as possible: Allergic reactions--skin rash, itching, hives, swelling of the face, lips, tongue, or throat Infection--fever, chills, cough, sore throat, wounds that don't heal, pain or trouble when passing urine, general feeling of discomfort or being unwell Kidney injury--decrease in the amount of urine, swelling of the  ankles, hands, or feet Liver injury--right upper belly pain, loss of appetite, nausea, light-colored stool, dark yellow or brown urine, yellowing skin or eyes, unusual weakness or fatigue Low red blood cell level--unusual weakness or fatigue, dizziness, headache, trouble breathing Tumor lysis syndrome (TLS)--nausea, vomiting, diarrhea, decrease in the amount of urine, dark urine, unusual weakness or fatigue, confusion, muscle pain or cramps, fast or irregular heartbeat, joint pain Unusual bruising or bleeding Side effects that usually do not require medical attention (report to your care team if they continue or are bothersome): Constipation Diarrhea Nausea Pain, redness, or irritation at injection site Vomiting This list may not describe all possible side effects. Call your doctor for medical advice about side effects. You may report side effects to FDA at 1-800-FDA-1088. Where should I keep my medication? This medication is given in a hospital or clinic. It will not be stored at home. NOTE: This sheet is a summary. It may not cover all possible information. If you have questions about this medicine, talk to your doctor, pharmacist, or health care provider.  2024 Elsevier/Gold Standard (2022-11-04 00:00:00)  BELOW ARE SYMPTOMS THAT SHOULD BE REPORTED IMMEDIATELY: *FEVER GREATER THAN 100.4 F (38 C)  OR HIGHER *CHILLS OR SWEATING *NAUSEA AND VOMITING THAT IS NOT CONTROLLED WITH YOUR NAUSEA MEDICATION *UNUSUAL SHORTNESS OF BREATH *UNUSUAL BRUISING OR BLEEDING *URINARY PROBLEMS (pain or burning when urinating, or frequent urination) *BOWEL PROBLEMS (unusual diarrhea, constipation, pain near the anus) TENDERNESS IN MOUTH AND THROAT WITH OR WITHOUT PRESENCE OF ULCERS (sore throat, sores in mouth, or a toothache) UNUSUAL RASH, SWELLING OR PAIN  UNUSUAL VAGINAL DISCHARGE OR ITCHING   Items with * indicate a potential emergency and should be followed up as soon as possible or go to the  Emergency Department if any problems should occur.  Please show the CHEMOTHERAPY ALERT CARD or IMMUNOTHERAPY ALERT CARD at check-in to the Emergency Department and triage nurse.  Should you have questions after your visit or need to cancel or reschedule your appointment, please contact [facility] [place] - A DEPT OF [facility] [phone]  and follow the prompts.  Office hours are 8:00 a.m. to 4:30 p.m. Monday - Friday. Please note that voicemails left after 4:00 p.m. may not be returned until the following business day.  We are closed weekends and major holidays. You have access to a nurse at all times for urgent questions. Please call the main number to the clinic [phone] and follow the prompts.  For any non-urgent questions, you may also contact your provider using MyChart. We now offer e-Visits for anyone 41 and older to request care online for non-urgent symptoms. For details visit [email or web address].   Also download the MyChart app! Go to the app store, search MyChart, open the app, select [facility], and log in with your MyChart username and password.

## 2023-12-10 ENCOUNTER — Encounter: Payer: Self-pay | Admitting: Oncology

## 2023-12-10 ENCOUNTER — Inpatient Hospital Stay

## 2023-12-10 VITALS — BP 133/76 | HR 54 | Temp 97.6°F | Resp 16

## 2023-12-10 DIAGNOSIS — Z5111 Encounter for antineoplastic chemotherapy: Secondary | ICD-10-CM | POA: Diagnosis not present

## 2023-12-10 DIAGNOSIS — Z95828 Presence of other vascular implants and grafts: Secondary | ICD-10-CM

## 2023-12-10 DIAGNOSIS — D469 Myelodysplastic syndrome, unspecified: Secondary | ICD-10-CM

## 2023-12-10 MED ORDER — PALONOSETRON HCL INJECTION 0.25 MG/5ML
0.2500 mg | Freq: Once | INTRAVENOUS | Status: AC
  Administered 2023-12-10: 0.25 mg via INTRAVENOUS
  Filled 2023-12-10: qty 5

## 2023-12-10 MED ORDER — SODIUM CHLORIDE 0.9 % IV SOLN: Freq: Once | INTRAVENOUS | Status: AC

## 2023-12-10 MED ORDER — DEXAMETHASONE SODIUM PHOSPHATE 10 MG/ML IJ SOLN
10.0000 mg | Freq: Once | INTRAMUSCULAR | Status: AC
  Administered 2023-12-10: 10 mg via INTRAVENOUS
  Filled 2023-12-10: qty 1

## 2023-12-10 MED ORDER — SODIUM CHLORIDE 0.9 % IV SOLN
75.0000 mg/m2 | Freq: Once | INTRAVENOUS | Status: AC
  Administered 2023-12-10: 140 mg via INTRAVENOUS
  Filled 2023-12-10: qty 14

## 2023-12-10 NOTE — Patient Instructions (Signed)
 [facility] [place] - A DEPT OF [facility]  Discharge Instructions: Thank you for choosing [facility] to provide your oncology and hematology care.  If you have a lab appointment with the Cancer Center - please note that after April 8th, 2024, all labs will be drawn in the cancer center.  You do not have to check in or register with the main entrance as you have in the past but will complete your check-in in the cancer center.  Wear comfortable clothing and clothing appropriate for easy access to any Portacath or PICC line.   We strive to give you quality time with your provider. You may need to reschedule your appointment if you arrive late (15 or more minutes).  Arriving late affects you and other patients whose appointments are after yours.  Also, if you miss three or more appointments without notifying the office, you may be dismissed from the clinic at the provider's discretion.      For prescription refill requests, have your pharmacy contact our office and allow 72 hours for refills to be completed.    Today you received the following chemotherapy and/or immunotherapy agents D2 Vidaza    To help prevent nausea and vomiting after your treatment, we encourage you to take your nausea medication as directed.  Azacitidine  Injection What is this medication? AZACITIDINE  (ay za SITE i deen) treats blood and bone marrow cancers. It works by slowing down the growth of cancer cells. This medicine may be used for other purposes; ask your health care provider or pharmacist if you have questions. COMMON BRAND NAME(S): Vidaza  What should I tell my care team before I take this medication? They need to know if you have any of these conditions: Kidney disease Liver disease Low blood cell levels, such as low white cells, platelets, or red blood cells Low levels of albumin in the blood Low levels of bicarbonate in the blood An unusual or allergic reaction to  azacitidine , mannitol, other medications, foods, dyes, or preservatives If you or your partner are pregnant or trying to get pregnant Breast-feeding How should I use this medication? This medication is injected into a vein or under the skin. It is given by your care team in a hospital or clinic setting. Talk to your care team about the use of this medication in children. While it may be prescribed for children as young as 1 month for selected conditions, precautions do apply. Overdosage: If you think you have taken too much of this medicine contact a poison control center or emergency room at once. NOTE: This medicine is only for you. Do not share this medicine with others. What if I miss a dose? Keep appointments for follow-up doses. It is important not to miss your dose. Call your care team if you are unable to keep an appointment. What may interact with this medication? Interactions are not expected. This list may not describe all possible interactions. Give your health care provider a list of all the medicines, herbs, non-prescription drugs, or dietary supplements you use. Also tell them if you smoke, drink alcohol, or use illegal drugs. Some items may interact with your medicine. What should I watch for while using this medication? Your condition will be monitored carefully while you are receiving this medication. This medication may make you feel generally unwell. This is not uncommon as chemotherapy can affect healthy cells as well as cancer cells. Report any side effects. Continue your course of treatment  even though you feel ill unless your care team tells you to stop. You may need blood work done while you are taking this medication. Other product types may be available that contain the medication azacitidine . The injection and oral products should not be used in place of one another. Talk to your care team if you have questions. This medication can cause serious side effects. To reduce  the risk, your care team may give you other medications to take before receiving this one. Be sure to follow the directions from your care team. This medication may increase your risk of getting an infection. Call your care team for advice if you get a fever, chills, sore throat, or other symptoms of a cold or flu. Do not treat yourself. Try to avoid being around people who are sick. Avoid taking medications that contain aspirin, acetaminophen , ibuprofen, naproxen , or ketoprofen unless instructed by your care team. These medications may hide a fever. Be careful brushing or flossing your teeth or using a toothpick because you may get an infection or bleed more easily. If you have any dental work done, tell your dentist you are receiving this medication. Talk to your care team if you or your partner may be pregnant. Serious birth defects can occur if you take this medication during pregnancy and for 6 months after the last dose. You will need a negative pregnancy test before starting this medication. Contraception is recommended while taking this medication and for 6 months after the last dose. Your care team can help you find the option that works for you. If your partner can get pregnant, use a condom during sex while taking this medication and for 3 months after the last dose. Do not breastfeed while taking this medication and for 1 week after the last dose. This medication may cause infertility. Talk to your care team if you are concerned about your fertility. What side effects may I notice from receiving this medication? Side effects that you should report to your care team as soon as possible: Allergic reactions--skin rash, itching, hives, swelling of the face, lips, tongue, or throat Infection--fever, chills, cough, sore throat, wounds that don't heal, pain or trouble when passing urine, general feeling of discomfort or being unwell Kidney injury--decrease in the amount of urine, swelling of the  ankles, hands, or feet Liver injury--right upper belly pain, loss of appetite, nausea, light-colored stool, dark yellow or brown urine, yellowing skin or eyes, unusual weakness or fatigue Low red blood cell level--unusual weakness or fatigue, dizziness, headache, trouble breathing Tumor lysis syndrome (TLS)--nausea, vomiting, diarrhea, decrease in the amount of urine, dark urine, unusual weakness or fatigue, confusion, muscle pain or cramps, fast or irregular heartbeat, joint pain Unusual bruising or bleeding Side effects that usually do not require medical attention (report to your care team if they continue or are bothersome): Constipation Diarrhea Nausea Pain, redness, or irritation at injection site Vomiting This list may not describe all possible side effects. Call your doctor for medical advice about side effects. You may report side effects to FDA at 1-800-FDA-1088. Where should I keep my medication? This medication is given in a hospital or clinic. It will not be stored at home. NOTE: This sheet is a summary. It may not cover all possible information. If you have questions about this medicine, talk to your doctor, pharmacist, or health care provider.  2024 Elsevier/Gold Standard (2022-11-04 00:00:00)  BELOW ARE SYMPTOMS THAT SHOULD BE REPORTED IMMEDIATELY: *FEVER GREATER THAN 100.4 F (38 C)  OR HIGHER *CHILLS OR SWEATING *NAUSEA AND VOMITING THAT IS NOT CONTROLLED WITH YOUR NAUSEA MEDICATION *UNUSUAL SHORTNESS OF BREATH *UNUSUAL BRUISING OR BLEEDING *URINARY PROBLEMS (pain or burning when urinating, or frequent urination) *BOWEL PROBLEMS (unusual diarrhea, constipation, pain near the anus) TENDERNESS IN MOUTH AND THROAT WITH OR WITHOUT PRESENCE OF ULCERS (sore throat, sores in mouth, or a toothache) UNUSUAL RASH, SWELLING OR PAIN  UNUSUAL VAGINAL DISCHARGE OR ITCHING   Items with * indicate a potential emergency and should be followed up as soon as possible or go to the  Emergency Department if any problems should occur.  Please show the CHEMOTHERAPY ALERT CARD or IMMUNOTHERAPY ALERT CARD at check-in to the Emergency Department and triage nurse.  Should you have questions after your visit or need to cancel or reschedule your appointment, please contact [facility] [place] - A DEPT OF [facility] [phone]  and follow the prompts.  Office hours are 8:00 a.m. to 4:30 p.m. Monday - Friday. Please note that voicemails left after 4:00 p.m. may not be returned until the following business day.  We are closed weekends and major holidays. You have access to a nurse at all times for urgent questions. Please call the main number to the clinic [phone] and follow the prompts.  For any non-urgent questions, you may also contact your provider using MyChart. We now offer e-Visits for anyone 41 and older to request care online for non-urgent symptoms. For details visit [email or web address].   Also download the MyChart app! Go to the app store, search MyChart, open the app, select [facility], and log in with your MyChart username and password.

## 2023-12-10 NOTE — Progress Notes (Signed)
 Patient presents today for D3 Vidaza  infusion.  Patient is in satisfactory condition with no new complaints voiced.  Vital signs are stable.  Labs reviewed and all labs are within treatment parameters.  We will proceed with treatment per MD orders.    Treatment given today per MD orders. Tolerated infusion without adverse affects. Vital signs stable. No complaints at this time. Discharged from clinic ambulatory in stable condition. Alert and oriented x 3. F/U with [facility] as scheduled.

## 2023-12-11 ENCOUNTER — Inpatient Hospital Stay

## 2023-12-11 VITALS — BP 117/76 | HR 65 | Temp 97.7°F | Resp 18

## 2023-12-11 DIAGNOSIS — D469 Myelodysplastic syndrome, unspecified: Secondary | ICD-10-CM

## 2023-12-11 DIAGNOSIS — Z95828 Presence of other vascular implants and grafts: Secondary | ICD-10-CM

## 2023-12-11 DIAGNOSIS — Z5111 Encounter for antineoplastic chemotherapy: Secondary | ICD-10-CM | POA: Diagnosis not present

## 2023-12-11 MED ORDER — DEXAMETHASONE SODIUM PHOSPHATE 10 MG/ML IJ SOLN
10.0000 mg | Freq: Once | INTRAMUSCULAR | Status: AC
  Administered 2023-12-11: 10 mg via INTRAVENOUS
  Filled 2023-12-11: qty 1

## 2023-12-11 MED ORDER — SODIUM CHLORIDE 0.9 % IV SOLN: Freq: Once | INTRAVENOUS | Status: AC

## 2023-12-11 MED ORDER — SODIUM CHLORIDE 0.9 % IV SOLN
75.0000 mg/m2 | Freq: Once | INTRAVENOUS | Status: AC
  Administered 2023-12-11: 140 mg via INTRAVENOUS
  Filled 2023-12-11: qty 14

## 2023-12-11 NOTE — Progress Notes (Signed)
 Patient presents today for chemotherapy infusion of Vidaza .  Patient is in satisfactory condition with no new complaints voiced.  Vital signs are stable. No labs needed, VS within treatment parameters.  We will proceed with treatment per MD orders.    Patient tolerated treatment well with no complaints voiced.  Patient left ambulatory in stable condition.  Vital signs stable at discharge.  Follow up as scheduled.

## 2023-12-11 NOTE — Patient Instructions (Signed)
 [facility] [place] - [facility] DE[facility] OF [facility]  Discharge Instructions: Thank you for choosing [facility] to provide your oncology and hematology care.  If you have a lab appointment with the Cancer Center - please note that after April 8th, 2024, all labs will be drawn in the cancer center.  You do not have to check in or register with the main entrance as you have in the past but will complete your check-in in the cancer center.  Wear comfortable clothing and clothing appropriate for easy access to any Portacath or PICC line.   We strive to give you quality time with your provider. You may need to reschedule your appointment if you arrive late (15 or more minutes).  Arriving late affects you and other patients whose appointments are after yours.  Also, if you miss three or more appointments without notifying the office, you may be dismissed from the clinic at the provider's discretion.      For prescription refill requests, have your pharmacy contact our office and allow 72 hours for refills to be completed.    Today you received the following chemotherapy and/or immunotherapy agents Vidaza.  Azacitidine Injection What is this medication? AZACITIDINE (ay za SITE i deen) treats blood and bone marrow cancers. It works by slowing down the growth of cancer cells. This medicine may be used for other purposes; ask your health care provider or pharmacist if you have questions. COMMON BRAND NAME(S): Vidaza What should I tell my care team before I take this medication? They need to know if you have any of these conditions: Kidney disease Liver disease Low blood cell levels, such as low white cells, platelets, or red blood cells Low levels of albumin in the blood Low levels of bicarbonate in the blood An unusual or allergic reaction to azacitidine, mannitol, other medications, foods, dyes, or preservatives If you or your partner are pregnant or trying to get  pregnant Breast-feeding How should I use this medication? This medication is injected into a vein or under the skin. It is given by your care team in a hospital or clinic setting. Talk to your care team about the use of this medication in children. While it may be prescribed for children as young as 1 month for selected conditions, precautions do apply. Overdosage: If you think you have taken too much of this medicine contact a poison control center or emergency room at once. NOTE: This medicine is only for you. Do not share this medicine with others. What if I miss a dose? Keep appointments for follow-up doses. It is important not to miss your dose. Call your care team if you are unable to keep an appointment. What may interact with this medication? Interactions are not expected. This list may not describe all possible interactions. Give your health care provider a list of all the medicines, herbs, non-prescription drugs, or dietary supplements you use. Also tell them if you smoke, drink alcohol, or use illegal drugs. Some items may interact with your medicine. What should I watch for while using this medication? Your condition will be monitored carefully while you are receiving this medication. This medication may make you feel generally unwell. This is not uncommon as chemotherapy can affect healthy cells as well as cancer cells. Report any side effects. Continue your course of treatment even though you feel ill unless your care team tells you to stop. You may need blood work done while you are  taking this medication. Other product types may be available that contain the medication azacitidine. The injection and oral products should not be used in place of one another. Talk to your care team if you have questions. This medication can cause serious side effects. To reduce the risk, your care team may give you other medications to take before receiving this one. Be sure to follow the directions from  your care team. This medication may increase your risk of getting an infection. Call your care team for advice if you get a fever, chills, sore throat, or other symptoms of a cold or flu. Do not treat yourself. Try to avoid being around people who are sick. Avoid taking medications that contain aspirin, acetaminophen, ibuprofen, naproxen, or ketoprofen unless instructed by your care team. These medications may hide a fever. Be careful brushing or flossing your teeth or using a toothpick because you may get an infection or bleed more easily. If you have any dental work done, tell your dentist you are receiving this medication. Talk to your care team if you or your partner may be pregnant. Serious birth defects can occur if you take this medication during pregnancy and for 6 months after the last dose. You will need a negative pregnancy test before starting this medication. Contraception is recommended while taking this medication and for 6 months after the last dose. Your care team can help you find the option that works for you. If your partner can get pregnant, use a condom during sex while taking this medication and for 3 months after the last dose. Do not breastfeed while taking this medication and for 1 week after the last dose. This medication may cause infertility. Talk to your care team if you are concerned about your fertility. What side effects may I notice from receiving this medication? Side effects that you should report to your care team as soon as possible: Allergic reactions--skin rash, itching, hives, swelling of the face, lips, tongue, or throat Infection--fever, chills, cough, sore throat, wounds that don't heal, pain or trouble when passing urine, general feeling of discomfort or being unwell Kidney injury--decrease in the amount of urine, swelling of the ankles, hands, or feet Liver injury--right upper belly pain, loss of appetite, nausea, light-colored stool, dark yellow or Chad Lamb  urine, yellowing skin or eyes, unusual weakness or fatigue Low red blood cell level--unusual weakness or fatigue, dizziness, headache, trouble breathing Tumor lysis syndrome (TLS)--nausea, vomiting, diarrhea, decrease in the amount of urine, dark urine, unusual weakness or fatigue, confusion, muscle pain or cramps, fast or irregular heartbeat, joint pain Unusual bruising or bleeding Side effects that usually do not require medical attention (report to your care team if they continue or are bothersome): Constipation Diarrhea Nausea Pain, redness, or irritation at injection site Vomiting This list may not describe all possible side effects. Call your doctor for medical advice about side effects. You may report side effects to FDA at 1-800-FDA-1088. Where should I keep my medication? This medication is given in a hospital or clinic. It will not be stored at home. NOTE: This sheet is a summary. It may not cover all possible information. If you have questions about this medicine, talk to your doctor, pharmacist, or health care provider.  2024 Elsevier/Gold Standard (2022-11-04 00:00:00)       To help prevent nausea and vomiting after your treatment, we encourage you to take your nausea medication as directed.  BELOW ARE SYMPTOMS THAT SHOULD BE REPORTED IMMEDIATELY: *FEVER GREATER THAN 100.4  F (38 C) OR HIGHER *CHILLS OR SWEATING *NAUSEA AND VOMITING THAT IS NOT CONTROLLED WITH YOUR NAUSEA MEDICATION *UNUSUAL SHORTNESS OF BREATH *UNUSUAL BRUISING OR BLEEDING *URINARY PROBLEMS (pain or burning when urinating, or frequent urination) *BOWEL PROBLEMS (unusual diarrhea, constipation, pain near the anus) TENDERNESS IN MOUTH AND THROAT WITH OR WITHOUT PRESENCE OF ULCERS (sore throat, sores in mouth, or a toothache) UNUSUAL RASH, SWELLING OR PAIN  UNUSUAL VAGINAL DISCHARGE OR ITCHING   Items with * indicate a potential emergency and should be followed up as soon as possible or go to the Emergency  Department if any problems should occur.  Please show the CHEMOTHERAPY ALERT CARD or IMMUNOTHERAPY ALERT CARD at check-in to the Emergency Department and triage nurse.  Should you have questions after your visit or need to cancel or reschedule your appointment, please contact [facility] [place] - A DEPT OF Eligha Bridegroom [facility] [phone]  and follow the prompts.  Office hours are 8:00 a.m. to 4:30 p.m. Monday - Friday. Please note that voicemails left after 4:00 p.m. may not be returned until the following business day.  We are closed weekends and major holidays. You have access to a nurse at all times for urgent questions. Please call the main number to the clinic [phone] and follow the prompts.  For any non-urgent questions, you may also contact your provider using MyChart. We now offer e-Visits for anyone 88 and older to request care online for non-urgent symptoms. For details visit [email or web address].   Also download the MyChart app! Go to the app store, search "MyChart", open the app, select [place], and log in with your MyChart username and password.

## 2023-12-12 ENCOUNTER — Inpatient Hospital Stay

## 2023-12-12 VITALS — BP 122/80 | HR 53 | Temp 96.7°F | Resp 18

## 2023-12-12 DIAGNOSIS — Z5111 Encounter for antineoplastic chemotherapy: Secondary | ICD-10-CM | POA: Diagnosis not present

## 2023-12-12 DIAGNOSIS — Z95828 Presence of other vascular implants and grafts: Secondary | ICD-10-CM

## 2023-12-12 DIAGNOSIS — D469 Myelodysplastic syndrome, unspecified: Secondary | ICD-10-CM

## 2023-12-12 MED ORDER — SODIUM CHLORIDE 0.9 % IV SOLN
75.0000 mg/m2 | Freq: Once | INTRAVENOUS | Status: AC
  Administered 2023-12-12: 140 mg via INTRAVENOUS
  Filled 2023-12-12: qty 14

## 2023-12-12 MED ORDER — DEXAMETHASONE SODIUM PHOSPHATE 10 MG/ML IJ SOLN
10.0000 mg | Freq: Once | INTRAMUSCULAR | Status: AC
  Administered 2023-12-12: 10 mg via INTRAVENOUS
  Filled 2023-12-12: qty 1

## 2023-12-12 MED ORDER — SODIUM CHLORIDE 0.9 % IV SOLN: Freq: Once | INTRAVENOUS | Status: AC

## 2023-12-12 MED ORDER — PALONOSETRON HCL INJECTION 0.25 MG/5ML
0.2500 mg | Freq: Once | INTRAVENOUS | Status: AC
  Administered 2023-12-12: 0.25 mg via INTRAVENOUS
  Filled 2023-12-12: qty 5

## 2023-12-12 NOTE — Patient Instructions (Signed)
 [facility] [place] [facility]  Discharge Instructions: Thank you for choosing [facility] to provide your oncology and hematology care.  If you have a lab appointment with the Cancer Center - please note that after April 8th, 2024, all labs will be drawn in the cancer center.  You do not have to check in or register with the main entrance as you have in the past but will complete your check-in in the cancer center.  Wear comfortable clothing and clothing appropriate for easy access to any Portacath or PICC line.   We strive to give you quality time with your provider. You may need to reschedule your appointment if you arrive late (15 or more minutes).  Arriving late affects you and other patients whose appointments are after yours.  Also, if you miss three or more appointments without notifying the office, you may be dismissed from the clinic at the provider's discretion.      For prescription refill requests, have your pharmacy contact our office and allow 72 hours for refills to be completed.    Today you received the following chemotherapy and/or immunotherapy agents vidaza.       To help prevent nausea and vomiting after your treatment, we encourage you to take your nausea medication as directed.  BELOW ARE SYMPTOMS THAT SHOULD BE REPORTED IMMEDIATELY: *FEVER GREATER THAN 100.4 F (38 C) OR HIGHER *CHILLS OR SWEATING *NAUSEA AND VOMITING THAT IS NOT CONTROLLED WITH YOUR NAUSEA MEDICATION *UNUSUAL SHORTNESS OF BREATH *UNUSUAL BRUISING OR BLEEDING *URINARY PROBLEMS (pain or burning when urinating, or frequent urination) *BOWEL PROBLEMS (unusual diarrhea, constipation, pain near the anus) TENDERNESS IN MOUTH AND THROAT WITH OR WITHOUT PRESENCE OF ULCERS (sore throat, sores in mouth, or a toothache) UNUSUAL RASH, SWELLING OR PAIN  UNUSUAL VAGINAL DISCHARGE OR ITCHING   Items with * indicate a potential emergency and should be followed up  as soon as possible or go to the Emergency Department if any problems should occur.  Please show the CHEMOTHERAPY ALERT CARD or IMMUNOTHERAPY ALERT CARD at check-in to the Emergency Department and triage nurse.  Should you have questions after your visit or need to cancel or reschedule your appointment, please contact [facility] [place] - A DEPT OF [facility]ideg[facility] [phone]  and follow the prompts.  Office hours are 8:00 a.m. to 4:30 p.m. Monday - Friday. Please note that voicemails left after 4:00 p.m. may not be returned until the following business day.  We are closed weekends and major holidays. You have access to a nurse at all times for urgent questions. Please call the main number to the clinic [phone] and follow the prompts.  For any non-urgent questions, you may also contact your provider using MyChart. We now offer e-Visits for anyone 8 and older to request care online for non-urgent symptoms. For details visit [email or web address].   Also download the MyChart app! Go to the app store, search "MyChart", open the app, select [place], and log in with your MyChart username and password.

## 2023-12-12 NOTE — Progress Notes (Signed)

## 2023-12-15 ENCOUNTER — Inpatient Hospital Stay

## 2023-12-15 ENCOUNTER — Ambulatory Visit: Admitting: Oncology

## 2023-12-16 ENCOUNTER — Ambulatory Visit

## 2023-12-17 ENCOUNTER — Inpatient Hospital Stay

## 2023-12-18 ENCOUNTER — Ambulatory Visit

## 2023-12-19 ENCOUNTER — Inpatient Hospital Stay

## 2023-12-19 ENCOUNTER — Other Ambulatory Visit: Payer: Self-pay

## 2023-12-28 ENCOUNTER — Other Ambulatory Visit: Payer: Self-pay | Admitting: Medical Genetics

## 2023-12-29 ENCOUNTER — Other Ambulatory Visit (HOSPITAL_COMMUNITY)
Admission: RE | Admit: 2023-12-29 | Discharge: 2023-12-29 | Disposition: A | Discharge status: Home / Self Care | Payer: Self-pay | Source: Ambulatory Visit | Attending: Medical Genetics | Admitting: Medical Genetics

## 2024-01-06 LAB — GENECONNECT MOLECULAR SCREEN: Genetic Analysis Overall Interpretation: NEGATIVE

## 2024-01-07 ENCOUNTER — Other Ambulatory Visit: Payer: Self-pay

## 2024-01-09 ENCOUNTER — Other Ambulatory Visit: Payer: Self-pay

## 2024-01-09 ENCOUNTER — Encounter (INDEPENDENT_AMBULATORY_CARE_PROVIDER_SITE_OTHER): Payer: Self-pay

## 2024-01-09 NOTE — Progress Notes (Signed)
 Specialty Pharmacy Refill Coordination Note  Chad Lamb is a 78 y.o. male contacted today regarding refills of specialty medication(s) Venetoclax  (VENCLEXTA )   Patient requested (Patient-Rptd) Delivery   Delivery date: 01/12/24   Verified address: (Patient-Rptd) [address], [place] [address] --- (side door)   Medication will be filled on 01/09/24.

## 2024-01-19 ENCOUNTER — Inpatient Hospital Stay (HOSPITAL_BASED_OUTPATIENT_CLINIC_OR_DEPARTMENT_OTHER): Admitting: Oncology

## 2024-01-19 ENCOUNTER — Encounter: Payer: Self-pay | Admitting: Oncology

## 2024-01-19 ENCOUNTER — Inpatient Hospital Stay

## 2024-01-19 ENCOUNTER — Inpatient Hospital Stay: Attending: Hematology

## 2024-01-19 VITALS — BP 131/84 | HR 51 | Temp 97.6°F | Resp 16

## 2024-01-19 DIAGNOSIS — D708 Other neutropenia: Secondary | ICD-10-CM

## 2024-01-19 DIAGNOSIS — D4622 Refractory anemia with excess of blasts 2: Secondary | ICD-10-CM | POA: Insufficient documentation

## 2024-01-19 DIAGNOSIS — Z5111 Encounter for antineoplastic chemotherapy: Secondary | ICD-10-CM | POA: Diagnosis present

## 2024-01-19 DIAGNOSIS — D469 Myelodysplastic syndrome, unspecified: Secondary | ICD-10-CM

## 2024-01-19 DIAGNOSIS — Z95828 Presence of other vascular implants and grafts: Secondary | ICD-10-CM

## 2024-01-19 LAB — CBC WITH DIFFERENTIAL/PLATELET
Abs Immature Granulocytes: 0.01 K/uL (ref 0.00–0.07)
Basophils Absolute: 0 K/uL (ref 0.0–0.1)
Basophils Relative: 0 %
Eosinophils Absolute: 0.9 K/uL — ABNORMAL HIGH (ref 0.0–0.5)
Eosinophils Relative: 13 %
HCT: 38.2 % — ABNORMAL LOW (ref 39.0–52.0)
Hemoglobin: 12.6 g/dL — ABNORMAL LOW (ref 13.0–17.0)
Immature Granulocytes: 0 %
Lymphocytes Relative: 17 %
Lymphs Abs: 1.2 K/uL (ref 0.7–4.0)
MCH: 27.2 pg (ref 26.0–34.0)
MCHC: 33 g/dL (ref 30.0–36.0)
MCV: 82.5 fL (ref 80.0–100.0)
Monocytes Absolute: 0.9 K/uL (ref 0.1–1.0)
Monocytes Relative: 12 %
Neutro Abs: 4.1 K/uL (ref 1.7–7.7)
Neutrophils Relative %: 58 %
Platelets: 203 K/uL (ref 150–400)
RBC: 4.63 MIL/uL (ref 4.22–5.81)
RDW: 17.5 % — ABNORMAL HIGH (ref 11.5–15.5)
WBC: 7.1 K/uL (ref 4.0–10.5)
nRBC: 0 % (ref 0.0–0.2)

## 2024-01-19 LAB — COMPREHENSIVE METABOLIC PANEL WITH GFR
ALT: 16 U/L (ref 0–44)
AST: 21 U/L (ref 15–41)
Albumin: 4.3 g/dL (ref 3.5–5.0)
Alkaline Phosphatase: 95 U/L (ref 38–126)
Anion gap: 9 (ref 5–15)
BUN: 11 mg/dL (ref 8–23)
CO2: 25 mmol/L (ref 22–32)
Calcium: 9.6 mg/dL (ref 8.9–10.3)
Chloride: 104 mmol/L (ref 98–111)
Creatinine, Ser: 1.09 mg/dL (ref 0.61–1.24)
GFR, Estimated: >60 mL/min (ref >60)
Glucose, Bld: 118 mg/dL — ABNORMAL HIGH (ref 70–99)
Potassium: 3.7 mmol/L (ref 3.5–5.1)
Sodium: 139 mmol/L (ref 135–145)
Total Bilirubin: 1.1 mg/dL (ref 0.0–1.2)
Total Protein: 6.7 g/dL (ref 6.5–8.1)

## 2024-01-19 LAB — MAGNESIUM: Magnesium: 2.2 mg/dL (ref 1.7–2.4)

## 2024-01-19 MED ORDER — DEXAMETHASONE SOD PHOSPHATE PF 10 MG/ML IJ SOLN
10.0000 mg | Freq: Once | INTRAMUSCULAR | Status: AC
  Administered 2024-01-19: 10 mg via INTRAVENOUS

## 2024-01-19 MED ORDER — SODIUM CHLORIDE 0.9 % IV SOLN: Freq: Once | INTRAVENOUS | Status: AC

## 2024-01-19 MED ORDER — SODIUM CHLORIDE 0.9 % IV SOLN
75.0000 mg/m2 | Freq: Once | INTRAVENOUS | Status: AC
  Administered 2024-01-19: 140 mg via INTRAVENOUS
  Filled 2024-01-19: qty 14

## 2024-01-19 MED ORDER — PALONOSETRON HCL INJECTION 0.25 MG/5ML
0.2500 mg | Freq: Once | INTRAVENOUS | Status: AC
  Administered 2024-01-19: 0.25 mg via INTRAVENOUS
  Filled 2024-01-19: qty 5

## 2024-01-19 NOTE — Patient Instructions (Signed)
 [facility] [place] - A DEPT OF [facility]  Discharge Instructions: Thank you for choosing [facility] to provide your oncology and hematology care.  If you have a lab appointment with the Cancer Center - please note that after April 8th, 2024, all labs will be drawn in the cancer center.  You do not have to check in or register with the main entrance as you have in the past but will complete your check-in in the cancer center.  Wear comfortable clothing and clothing appropriate for easy access to any Portacath or PICC line.   We strive to give you quality time with your provider. You may need to reschedule your appointment if you arrive late (15 or more minutes).  Arriving late affects you and other patients whose appointments are after yours.  Also, if you miss three or more appointments without notifying the office, you may be dismissed from the clinic at the provider's discretion.      For prescription refill requests, have your pharmacy contact our office and allow 72 hours for refills to be completed.    Today you received the following chemotherapy and/or immunotherapy agents D1 Vidaza       To help prevent nausea and vomiting after your treatment, we encourage you to take your nausea medication as directed.  Azacitidine  Injection What is this medication? AZACITIDINE  (ay [employer] i deen) treats blood and bone marrow cancers. It works by slowing down the growth of cancer cells. This medicine may be used for other purposes; ask your health care provider or pharmacist if you have questions. COMMON BRAND NAME(S): Vidaza  What should I tell my care team before I take this medication? They need to know if you have any of these conditions: Kidney disease Liver disease Low blood cell levels, such as low white cells, platelets, or red blood cells Low levels of albumin in the blood Low levels of bicarbonate in the blood An unusual or allergic reaction to  azacitidine , mannitol, other medications, foods, dyes, or preservatives If you or your partner are pregnant or trying to get pregnant Breast-feeding How should I use this medication? This medication is injected into a vein or under the skin. It is given by your care team in a hospital or clinic setting. Talk to your care team about the use of this medication in children. While it may be prescribed for children as young as 1 month for selected conditions, precautions do apply. Overdosage: If you think you have taken too much of this medicine contact a poison control center or emergency room at once. NOTE: This medicine is only for you. Do not share this medicine with others. What if I miss a dose? Keep appointments for follow-up doses. It is important not to miss your dose. Call your care team if you are unable to keep an appointment. What may interact with this medication? Interactions are not expected. This list may not describe all possible interactions. Give your health care provider a list of all the medicines, herbs, non-prescription drugs, or dietary supplements you use. Also tell them if you smoke, drink alcohol, or use illegal drugs. Some items may interact with your medicine. What should I watch for while using this medication? Your condition will be monitored carefully while you are receiving this medication. This medication may make you feel generally unwell. This is not uncommon as chemotherapy can affect healthy cells as well as cancer cells. Report any side effects. Continue your  course of treatment even though you feel ill unless your care team tells you to stop. You may need blood work done while you are taking this medication. Other product types may be available that contain the medication azacitidine . The injection and oral products should not be used in place of one another. Talk to your care team if you have questions. This medication can cause serious side effects. To reduce  the risk, your care team may give you other medications to take before receiving this one. Be sure to follow the directions from your care team. This medication may increase your risk of getting an infection. Call your care team for advice if you get a fever, chills, sore throat, or other symptoms of a cold or flu. Do not treat yourself. Try to avoid being around people who are sick. Avoid taking medications that contain aspirin, acetaminophen , ibuprofen, naproxen, or ketoprofen unless instructed by your care team. These medications may hide a fever. Be careful brushing or flossing your teeth or using a toothpick because you may get an infection or bleed more easily. If you have any dental work done, tell your dentist you are receiving this medication. Talk to your care team if you or your partner may be pregnant. Serious birth defects can occur if you take this medication during pregnancy and for 6 months after the last dose. You will need a negative pregnancy test before starting this medication. Contraception is recommended while taking this medication and for 6 months after the last dose. Your care team can help you find the option that works for you. If your partner can get pregnant, use a condom during sex while taking this medication and for 3 months after the last dose. Do not breastfeed while taking this medication and for 1 week after the last dose. This medication may cause infertility. Talk to your care team if you are concerned about your fertility. What side effects may I notice from receiving this medication? Side effects that you should report to your care team as soon as possible: Allergic reactions--skin rash, itching, hives, swelling of the face, lips, tongue, or throat Infection--fever, chills, cough, sore throat, wounds that don't heal, pain or trouble when passing urine, general feeling of discomfort or being unwell Kidney injury--decrease in the amount of urine, swelling of the  ankles, hands, or feet Liver injury--right upper belly pain, loss of appetite, nausea, light-colored stool, dark yellow or brown urine, yellowing skin or eyes, unusual weakness or fatigue Low red blood cell level--unusual weakness or fatigue, dizziness, headache, trouble breathing Tumor lysis syndrome (TLS)--nausea, vomiting, diarrhea, decrease in the amount of urine, dark urine, unusual weakness or fatigue, confusion, muscle pain or cramps, fast or irregular heartbeat, joint pain Unusual bruising or bleeding Side effects that usually do not require medical attention (report to your care team if they continue or are bothersome): Constipation Diarrhea Nausea Pain, redness, or irritation at injection site Vomiting This list may not describe all possible side effects. Call your doctor for medical advice about side effects. You may report side effects to FDA at 1-800-FDA-1088. Where should I keep my medication? This medication is given in a hospital or clinic. It will not be stored at home. NOTE: This sheet is a summary. It may not cover all possible information. If you have questions about this medicine, talk to your doctor, pharmacist, or health care provider.  2024 Elsevier/Gold Standard (2022-11-04 00:00:00)  BELOW ARE SYMPTOMS THAT SHOULD BE REPORTED IMMEDIATELY: *FEVER GREATER THAN 100.4  F (38 C) OR HIGHER *CHILLS OR SWEATING *NAUSEA AND VOMITING THAT IS NOT CONTROLLED WITH YOUR NAUSEA MEDICATION *UNUSUAL SHORTNESS OF BREATH *UNUSUAL BRUISING OR BLEEDING *URINARY PROBLEMS (pain or burning when urinating, or frequent urination) *BOWEL PROBLEMS (unusual diarrhea, constipation, pain near the anus) TENDERNESS IN MOUTH AND THROAT WITH OR WITHOUT PRESENCE OF ULCERS (sore throat, sores in mouth, or a toothache) UNUSUAL RASH, SWELLING OR PAIN  UNUSUAL VAGINAL DISCHARGE OR ITCHING   Items with * indicate a potential emergency and should be followed up as soon as possible or go to the  Emergency Department if any problems should occur.  Please show the CHEMOTHERAPY ALERT CARD or IMMUNOTHERAPY ALERT CARD at check-in to the Emergency Department and triage nurse.  Should you have questions after your visit or need to cancel or reschedule your appointment, please contact [facility]  - A DEPT OF [facility] [phone]  and follow the prompts.  Office hours are 8:00 a.m. to 4:30 p.m. Monday - Friday. Please note that voicemails left after 4:00 p.m. may not be returned until the following business day.  We are closed weekends and major holidays. You have access to a nurse at all times for urgent questions. Please call the main number to the clinic [phone] and follow the prompts.  For any non-urgent questions, you may also contact your provider using MyChart. We now offer e-Visits for anyone 3 and older to request care online for non-urgent symptoms. For details visit [email or web address].   Also download the MyChart app! Go to the app store, search MyChart, open the app, select [facility], and log in with your MyChart username and password.

## 2024-01-19 NOTE — Assessment & Plan Note (Addendum)
 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 Had good response with azacitidine  and venetoclax  Currently on azacitidine  days 1-5 and venetoclax  200 mg days 1-14 of a 42-day cycle Last bone marrow biopsy was from March 2024 with very good response  - Labs reviewed today: CMP: Normal creatinine and normal LFTs.  CBC: Normal WBC, hemoglobin 12.6, platelets: 203 - Physical exam stable.  Proceed with azacitidine  today. - Continue venetoclax  200 mg days 1-14 of a 42-day cycle.   - Will consider bone marrow biopsy if there is significant change in the counts  Return to clinic in 6 weeks before next cycle

## 2024-01-19 NOTE — Progress Notes (Signed)
 Patient presents today for D1 Vidaza  chemotherapy infusion. Patient is in satisfactory condition with no new complaints voiced.  Vital signs are stable.  Labs reviewed by Delon Hope, NP during the office visit and all labs are within treatment parameters.  We will proceed with treatment per MD orders.   Treatment given today per MD orders. Tolerated infusion without adverse affects. Vital signs stable. No complaints at this time. Discharged from clinic ambulatory in stable condition. Alert and oriented x 3. F/U with [facility] as scheduled.

## 2024-01-19 NOTE — Progress Notes (Signed)
 Patient Care Team: [name], MD as PCP - General (Family Medicine) [name], MD (Family Medicine) Harvey Margo CROME, MD (Inactive) as Consulting Physician (Gastroenterology) Davonna Siad, MD as Consulting Physician (Oncology)  Clinic Day:  01/19/2024  Referring physician: [name], MD   CHIEF COMPLAINT:  CC: MDS  ASSESSMENT & PLAN:   Assessment & Plan: Chad Lamb  is a 78 y.o. 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 Had good response with azacitidine  and venetoclax  Currently on azacitidine  days 1-5 and venetoclax  200 mg days 1-14 of a 42-day cycle Last bone marrow biopsy was from March 2024 with very good response  - Labs reviewed today: CMP: Normal creatinine and normal LFTs.  CBC: Normal WBC, hemoglobin 12.6, platelets: 203 - Physical exam stable.  Proceed with azacitidine  today. - Continue venetoclax  200 mg days 1-14 of a 42-day cycle.   - Will consider bone marrow biopsy if there is significant change in the counts  Return to clinic in 6 weeks before next cycle Other neutropenia Likely secondary to venetoclax   Normal WBC today  - Will continue to monitor counts at this time - Will consider bone marrow biopsy if it worsens.    The patient understands the plans discussed today and is in agreement with them.  He knows to contact our office if he develops concerns prior to his next appointment.  20 minutes of total time was spent for this patient encounter, including preparation, face-to-face counseling with the patient and coordination of care, physical exam, and documentation of the encounter.   Delon FORBES Hope, NP  [facility] [place] - A DEPT OF [facility] [address] [place] [address] Dept: [phone] Dept Fax: [phone]   Orders Placed This Encounter  Procedures    Magnesium    Standing Status:   Future    Expected Date:   03/08/2024    Expiration Date:   03/08/2025   Comprehensive metabolic panel    Standing Status:   Future    Expected Date:   03/08/2024    Expiration Date:   03/08/2025   CBC with Differential    Standing Status:   Future    Expected Date:   03/08/2024    Expiration Date:   03/08/2025     ONCOLOGY HISTORY:   I have reviewed his chart and materials related to his cancer extensively and collaborated history with the patient. Summary of oncologic history is as follows:  Myelodysplastic syndrome- EB2:  -01/05/2021: Presented with worsening leukopenia and thrombocytopenia - 06/04/2021: Flow cytometry: Increased metaplastic cells (10%).  Hypercellular bone marrow with MDS with excess blasts (MDS-EB-2).  Cellularity 50 to 70%.  Absent ring sideroblasts.  MDS FISH: Normal.  Normal male karyotype. IPSS-M score: 0.22- moderate high risk. - 06/18/2021: Mutations detected: ASXL1 - 07/30/2021-04/02/2022: Azacitidine  (days 1-7) every 28 days x 6 cycles - 01/04/2022: Flow cytometry: 3.3% CD4 positive myeloblasts.  Bone marrow consistent with persistent MDS.  Cellularity 40%.  Consistent with MDS with increased blasts. Absent ring sideroblasts normal MDS FISH.  Normal male karyotype. - 02/18/2022: Added Venetoclax  200mg  2 weeks on 2 weeks off - 06/05/2022: Flow cytometric: No significant CD34 positive blasts.  Bone marrow: Slightly hypercellular bone marrow for age.  Consistent with residual MDS.  Absent ring sideroblasts.  Normal male karyotype. -04/29/2022- Current: Decreased azacitidine  to 5 days and continued  venetoclax  200 mg days 1-14 of a 42-day cycle  Current Treatment: Azacitidine  (Days 1-5) + Venetoclax  200mg  days 1-14 of a 42 day cycle.    INTERVAL HISTORY:   Chad Lamb is here today for follow up and continuation of Vidaza  and venetoclax .  He is currently on venetoclax  and has no issues with the medication, such as diarrhea,  numbness, or tingling.  Reports overall he is tolerating treatment well.  Has shortness of breath at times.  Denies any pain.  Has anxiety and depression at times.  Appetite is 100% energy levels are 80%.  I have reviewed the past medical history, past surgical history, social history and family history with the patient and they are unchanged from previous note.  ALLERGIES:  is allergic to cephalosporins, amoxapine and related, amoxicillin, cephalexin, clindamycin/lincomycin, sulfamethoxazole, and trimethoprim.  MEDICATIONS:  Current Outpatient Medications  Medication Sig Dispense Refill   albuterol  (PROVENTIL  HFA;VENTOLIN  HFA) 108 (90 Base) MCG/ACT inhaler Inhale 1-2 puffs into the lungs every 6 (six) hours as needed for wheezing or shortness of breath.     ALPRAZolam (XANAX) 0.5 MG tablet Take 0.5 mg by mouth 2 (two) times daily as needed for anxiety.     amLODipine (NORVASC) 2.5 MG tablet Take 2.5 mg by mouth daily.     azaCITIDine  5 mg/2 mLs in lactated ringers  infusion Inject into the vein daily. Days 1-7 every 28 days     DENTA 5000 PLUS 1.1 % CREA dental cream Take by mouth.     docusate sodium (COLACE) 100 MG capsule 1 capsule Orally daily (3 daily when on chemo)     fluticasone  furoate-vilanterol (BREO ELLIPTA) 200-25 MCG/ACT AEPB Inhale 1 puff into the lungs daily.     Lactulose  20 GM/30ML SOLN Take 30 mLs (20 g total) by mouth at bedtime. 473 mL 3   Lidocaine -Hydrocort , Perianal, 3-0.5 % CREA Apply topically 2 (two) times daily as needed.     Lidocaine -Hydrocortisone  Ace 3-0.5 % CREA Apply 1 application topically as directed. 30 Tube 11   lidocaine -prilocaine  (EMLA ) cream Apply a SMALL AMOUNT TO port a cath site (DO not RUB in) AND cover with PLASTIC WRAP 1 hour prior TO infusion appointment 30 g 3   loratadine (CLARITIN) 10 MG tablet Take 10 mg by mouth daily.     Lutein 40 MG CAPS Take 40 mg by mouth daily.     Multiple Vitamins-Minerals (CENTRUM ADULTS PO) Take 1 tablet by mouth  daily.     nystatin cream (MYCOSTATIN) Apply 1 application. topically 2 (two) times daily as needed for dry skin.     ofloxacin (OCUFLOX) 0.3 % ophthalmic solution Place 1 drop into the left eye See admin instructions. 1 drop to left eye 4 x daily for 7 days after monthly procedure.     omeprazole (PRILOSEC OTC) 20 MG tablet Take 5-10 mg by mouth daily.     prochlorperazine  (COMPAZINE ) 10 MG tablet Take 1 tablet (10 mg total) by mouth every 6 (six) hours as needed (Nausea or vomiting). 30 tablet 2   sildenafil (VIAGRA) 50 MG tablet Take 50 mg by mouth daily as needed for erectile dysfunction.     traMADol  (ULTRAM ) 50 MG tablet Take 1 tablet (50 mg total) by mouth every 6 (six) hours as needed. 15 tablet 0   venetoclax  (VENCLEXTA ) 100 MG tablet Take 2 tablets (200 mg total) by mouth daily. Take for 14 days, then hold for 28 days. Repeat every 42 days. Take with a meal and  a full glass of water . 28 tablet 1   No current facility-administered medications for this visit.   Facility-Administered Medications Ordered in Other Visits  Medication Dose Route Frequency Provider Last Rate Last Admin   sodium chloride  flush (NS) 0.9 % injection 10 mL  10 mL Intracatheter PRN Rogers Hai, MD   10 mL at 06/12/22 1606    REVIEW OF SYSTEMS:   Review of Systems  Constitutional:  Positive for malaise/fatigue.  Respiratory:  Positive for shortness of breath.   Psychiatric/Behavioral:  Positive for depression. The patient is nervous/anxious.       VITALS:  There were no vitals taken for this visit.  Wt Readings from Last 3 Encounters:  01/19/24 150 lb 5.7 oz (68.2 kg)  12/08/23 152 lb (68.9 kg)  10/27/23 150 lb 12.8 oz (68.4 kg)    There is no height or weight on file to calculate BMI.  Performance status (ECOG): 1 - Symptomatic but completely ambulatory  PHYSICAL EXAM:   GENERAL:alert, no distress and comfortable SKIN: skin color, texture, turgor are normal, no rashes or significant  lesions LYMPH:  no palpable lymphadenopathy in the cervical, axillary or inguinal LUNGS: clear to auscultation and percussion with normal breathing effort HEART: regular rate & rhythm and no murmurs and no lower extremity edema ABDOMEN:abdomen soft, non-tender and normal bowel sounds Musculoskeletal:no cyanosis of digits and no clubbing  NEURO: alert & oriented x 3 with fluent speech, no focal motor/sensory deficits  LABORATORY DATA:  I have reviewed the data as listed    Component Value Date/Time   NA 139 01/19/2024 0935   K 3.7 01/19/2024 0935   CL 104 01/19/2024 0935   CO2 25 01/19/2024 0935   GLUCOSE 118 (H) 01/19/2024 0935   BUN 11 01/19/2024 0935   CREATININE 1.09 01/19/2024 0935   CREATININE 0.98 02/12/2017 1421   CALCIUM 9.6 01/19/2024 0935   PROT 6.7 01/19/2024 0935   ALBUMIN 4.3 01/19/2024 0935   AST 21 01/19/2024 0935   ALT 16 01/19/2024 0935   ALKPHOS 95 01/19/2024 0935   BILITOT 1.1 01/19/2024 0935   GFRNONAA >60 01/19/2024 0935   GFRNONAA 77 02/12/2017 1421   GFRAA >60 11/19/2017 1532   GFRAA 89 02/12/2017 1421     Lab Results  Component Value Date   WBC 7.1 01/19/2024   NEUTROABS 4.1 01/19/2024   HGB 12.6 (L) 01/19/2024   HCT 38.2 (L) 01/19/2024   MCV 82.5 01/19/2024   PLT 203 01/19/2024      Chemistry      Component Value Date/Time   NA 139 01/19/2024 0935   K 3.7 01/19/2024 0935   CL 104 01/19/2024 0935   CO2 25 01/19/2024 0935   BUN 11 01/19/2024 0935   CREATININE 1.09 01/19/2024 0935   CREATININE 0.98 02/12/2017 1421      Component Value Date/Time   CALCIUM 9.6 01/19/2024 0935   ALKPHOS 95 01/19/2024 0935   AST 21 01/19/2024 0935   ALT 16 01/19/2024 0935   BILITOT 1.1 01/19/2024 0935       RADIOGRAPHIC STUDIES: I have personally reviewed the radiological images as listed and agreed with the findings in the report.  None new to review

## 2024-01-19 NOTE — Assessment & Plan Note (Addendum)
 Likely secondary to venetoclax   Normal WBC today  - Will continue to monitor counts at this time - Will consider bone marrow biopsy if it worsens.

## 2024-01-20 ENCOUNTER — Encounter: Payer: Self-pay | Admitting: Oncology

## 2024-01-20 ENCOUNTER — Inpatient Hospital Stay

## 2024-01-20 ENCOUNTER — Other Ambulatory Visit: Payer: Self-pay

## 2024-01-20 VITALS — BP 144/78 | HR 57 | Temp 97.7°F | Resp 18

## 2024-01-20 DIAGNOSIS — Z5111 Encounter for antineoplastic chemotherapy: Secondary | ICD-10-CM | POA: Diagnosis not present

## 2024-01-20 DIAGNOSIS — Z95828 Presence of other vascular implants and grafts: Secondary | ICD-10-CM

## 2024-01-20 DIAGNOSIS — D469 Myelodysplastic syndrome, unspecified: Secondary | ICD-10-CM

## 2024-01-20 MED ORDER — SODIUM CHLORIDE 0.9 % IV SOLN
75.0000 mg/m2 | Freq: Once | INTRAVENOUS | Status: AC
  Administered 2024-01-20: 140 mg via INTRAVENOUS
  Filled 2024-01-20: qty 14

## 2024-01-20 MED ORDER — SODIUM CHLORIDE 0.9 % IV SOLN: Freq: Once | INTRAVENOUS | Status: AC

## 2024-01-20 MED ORDER — DEXAMETHASONE SOD PHOSPHATE PF 10 MG/ML IJ SOLN
10.0000 mg | Freq: Once | INTRAMUSCULAR | Status: AC
  Administered 2024-01-20: 10 mg via INTRAVENOUS
  Filled 2024-01-20: qty 1

## 2024-01-20 NOTE — Patient Instructions (Signed)
 [facility] [place] [facility]  Discharge Instructions: Thank you for choosing [facility] to provide your oncology and hematology care.  If you have a lab appointment with the Cancer Center - please note that after April 8th, 2024, all labs will be drawn in the cancer center.  You do not have to check in or register with the main entrance as you have in the past but will complete your check-in in the cancer center.  Wear comfortable clothing and clothing appropriate for easy access to any Portacath or PICC line.   We strive to give you quality time with your provider. You may need to reschedule your appointment if you arrive late (15 or more minutes).  Arriving late affects you and other patients whose appointments are after yours.  Also, if you miss three or more appointments without notifying the office, you may be dismissed from the clinic at the provider's discretion.      For prescription refill requests, have your pharmacy contact our office and allow 72 hours for refills to be completed.    Today you received the following chemotherapy and/or immunotherapy agents vidaza.       To help prevent nausea and vomiting after your treatment, we encourage you to take your nausea medication as directed.  BELOW ARE SYMPTOMS THAT SHOULD BE REPORTED IMMEDIATELY: *FEVER GREATER THAN 100.4 F (38 C) OR HIGHER *CHILLS OR SWEATING *NAUSEA AND VOMITING THAT IS NOT CONTROLLED WITH YOUR NAUSEA MEDICATION *UNUSUAL SHORTNESS OF BREATH *UNUSUAL BRUISING OR BLEEDING *URINARY PROBLEMS (pain or burning when urinating, or frequent urination) *BOWEL PROBLEMS (unusual diarrhea, constipation, pain near the anus) TENDERNESS IN MOUTH AND THROAT WITH OR WITHOUT PRESENCE OF ULCERS (sore throat, sores in mouth, or a toothache) UNUSUAL RASH, SWELLING OR PAIN  UNUSUAL VAGINAL DISCHARGE OR ITCHING   Items with * indicate a potential emergency and should be followed up  as soon as possible or go to the Emergency Department if any problems should occur.  Please show the CHEMOTHERAPY ALERT CARD or IMMUNOTHERAPY ALERT CARD at check-in to the Emergency Department and triage nurse.  Should you have questions after your visit or need to cancel or reschedule your appointment, please contact [facility] [place] - A DEPT OF [facility]ideg[facility] [phone]  and follow the prompts.  Office hours are 8:00 a.m. to 4:30 p.m. Monday - Friday. Please note that voicemails left after 4:00 p.m. may not be returned until the following business day.  We are closed weekends and major holidays. You have access to a nurse at all times for urgent questions. Please call the main number to the clinic [phone] and follow the prompts.  For any non-urgent questions, you may also contact your provider using MyChart. We now offer e-Visits for anyone 8 and older to request care online for non-urgent symptoms. For details visit [email or web address].   Also download the MyChart app! Go to the app store, search "MyChart", open the app, select [place], and log in with your MyChart username and password.

## 2024-01-20 NOTE — Progress Notes (Signed)

## 2024-01-21 ENCOUNTER — Inpatient Hospital Stay

## 2024-01-21 ENCOUNTER — Encounter: Payer: Self-pay | Admitting: Oncology

## 2024-01-21 VITALS — BP 133/90 | HR 58 | Temp 97.6°F | Resp 16

## 2024-01-21 DIAGNOSIS — Z5111 Encounter for antineoplastic chemotherapy: Secondary | ICD-10-CM | POA: Diagnosis not present

## 2024-01-21 DIAGNOSIS — Z95828 Presence of other vascular implants and grafts: Secondary | ICD-10-CM

## 2024-01-21 DIAGNOSIS — D469 Myelodysplastic syndrome, unspecified: Secondary | ICD-10-CM

## 2024-01-21 MED ORDER — SODIUM CHLORIDE 0.9 % IV SOLN: Freq: Once | INTRAVENOUS | Status: AC

## 2024-01-21 MED ORDER — DEXAMETHASONE SOD PHOSPHATE PF 10 MG/ML IJ SOLN
10.0000 mg | Freq: Once | INTRAMUSCULAR | Status: AC
  Administered 2024-01-21: 10 mg via INTRAVENOUS

## 2024-01-21 MED ORDER — PALONOSETRON HCL INJECTION 0.25 MG/5ML
0.2500 mg | Freq: Once | INTRAVENOUS | Status: AC
  Administered 2024-01-21: 0.25 mg via INTRAVENOUS
  Filled 2024-01-21: qty 5

## 2024-01-21 MED ORDER — SODIUM CHLORIDE 0.9 % IV SOLN
75.0000 mg/m2 | Freq: Once | INTRAVENOUS | Status: AC
  Administered 2024-01-21: 140 mg via INTRAVENOUS
  Filled 2024-01-21: qty 14

## 2024-01-21 NOTE — Progress Notes (Signed)
Patient presents today for Vidaza infusion per providers order.  Vital signs within parameters for treatment.  Patient has no new complaints at this time.  Treatment given today per MD orders.  Stable during infusion without adverse affects.  Vital signs stable.  No complaints at this time.  Discharge from clinic ambulatory in stable condition.  Alert and oriented X 3.  Follow up with [facility] as scheduled.

## 2024-01-21 NOTE — Patient Instructions (Signed)
 [facility] [place] - A DEPT OF [facility]  Discharge Instructions: Thank you for choosing [facility] to provide your oncology and hematology care.  If you have a lab appointment with the Cancer Center - please note that after April 8th, 2024, all labs will be drawn in the cancer center.  You do not have to check in or register with the main entrance as you have in the past but will complete your check-in in the cancer center.  Wear comfortable clothing and clothing appropriate for easy access to any Portacath or PICC line.   We strive to give you quality time with your provider. You may need to reschedule your appointment if you arrive late (15 or more minutes).  Arriving late affects you and other patients whose appointments are after yours.  Also, if you miss three or more appointments without notifying the office, you may be dismissed from the clinic at the provider's discretion.      For prescription refill requests, have your pharmacy contact our office and allow 72 hours for refills to be completed.    Today you received the following chemotherapy and/or immunotherapy agents Vidaza      To help prevent nausea and vomiting after your treatment, we encourage you to take your nausea medication as directed.  BELOW ARE SYMPTOMS THAT SHOULD BE REPORTED IMMEDIATELY: *FEVER GREATER THAN 100.4 F (38 C) OR HIGHER *CHILLS OR SWEATING *NAUSEA AND VOMITING THAT IS NOT CONTROLLED WITH YOUR NAUSEA MEDICATION *UNUSUAL SHORTNESS OF BREATH *UNUSUAL BRUISING OR BLEEDING *URINARY PROBLEMS (pain or burning when urinating, or frequent urination) *BOWEL PROBLEMS (unusual diarrhea, constipation, pain near the anus) TENDERNESS IN MOUTH AND THROAT WITH OR WITHOUT PRESENCE OF ULCERS (sore throat, sores in mouth, or a toothache) UNUSUAL RASH, SWELLING OR PAIN  UNUSUAL VAGINAL DISCHARGE OR ITCHING   Items with * indicate a potential emergency and should be followed up as  soon as possible or go to the Emergency Department if any problems should occur.  Please show the CHEMOTHERAPY ALERT CARD or IMMUNOTHERAPY ALERT CARD at check-in to the Emergency Department and triage nurse.  Should you have questions after your visit or need to cancel or reschedule your appointment, please contact [facility] [place] - A DEPT OF Eligha Bridegroom [facility] [phone]  and follow the prompts.  Office hours are 8:00 a.m. to 4:30 p.m. Monday - Friday. Please note that voicemails left after 4:00 p.m. may not be returned until the following business day.  We are closed weekends and major holidays. You have access to a nurse at all times for urgent questions. Please call the main number to the clinic [phone] and follow the prompts.  For any non-urgent questions, you may also contact your provider using MyChart. We now offer e-Visits for anyone 72 and older to request care online for non-urgent symptoms. For details visit [email or web address].   Also download the MyChart app! Go to the app store, search "MyChart", open the app, select [place], and log in with your MyChart username and password.

## 2024-01-22 ENCOUNTER — Inpatient Hospital Stay

## 2024-01-22 ENCOUNTER — Encounter: Payer: Self-pay | Admitting: Oncology

## 2024-01-22 VITALS — BP 136/83 | HR 56 | Temp 97.4°F | Resp 16

## 2024-01-22 DIAGNOSIS — Z95828 Presence of other vascular implants and grafts: Secondary | ICD-10-CM

## 2024-01-22 DIAGNOSIS — Z5111 Encounter for antineoplastic chemotherapy: Secondary | ICD-10-CM | POA: Diagnosis not present

## 2024-01-22 DIAGNOSIS — D469 Myelodysplastic syndrome, unspecified: Secondary | ICD-10-CM

## 2024-01-22 MED ORDER — SODIUM CHLORIDE 0.9 % IV SOLN: Freq: Once | INTRAVENOUS | Status: AC

## 2024-01-22 MED ORDER — DEXAMETHASONE SOD PHOSPHATE PF 10 MG/ML IJ SOLN
10.0000 mg | Freq: Once | INTRAMUSCULAR | Status: AC
  Administered 2024-01-22: 10 mg via INTRAVENOUS

## 2024-01-22 MED ORDER — SODIUM CHLORIDE 0.9 % IV SOLN
75.0000 mg/m2 | Freq: Once | INTRAVENOUS | Status: AC
  Administered 2024-01-22: 140 mg via INTRAVENOUS
  Filled 2024-01-22: qty 14

## 2024-01-22 MED FILL — Azacitidine For Inj 100 MG: INTRAMUSCULAR | Qty: 14 | Status: AC

## 2024-01-22 NOTE — Patient Instructions (Signed)
 [facility] [place] - A DEPT OF [facility]  Discharge Instructions: Thank you for choosing [facility] to provide your oncology and hematology care.  If you have a lab appointment with the Cancer Center - please note that after April 8th, 2024, all labs will be drawn in the cancer center.  You do not have to check in or register with the main entrance as you have in the past but will complete your check-in in the cancer center.  Wear comfortable clothing and clothing appropriate for easy access to any Portacath or PICC line.   We strive to give you quality time with your provider. You may need to reschedule your appointment if you arrive late (15 or more minutes).  Arriving late affects you and other patients whose appointments are after yours.  Also, if you miss three or more appointments without notifying the office, you may be dismissed from the clinic at the provider's discretion.      For prescription refill requests, have your pharmacy contact our office and allow 72 hours for refills to be completed.    Today you received the following chemotherapy and/or immunotherapy agents Vidaza      To help prevent nausea and vomiting after your treatment, we encourage you to take your nausea medication as directed.  BELOW ARE SYMPTOMS THAT SHOULD BE REPORTED IMMEDIATELY: *FEVER GREATER THAN 100.4 F (38 C) OR HIGHER *CHILLS OR SWEATING *NAUSEA AND VOMITING THAT IS NOT CONTROLLED WITH YOUR NAUSEA MEDICATION *UNUSUAL SHORTNESS OF BREATH *UNUSUAL BRUISING OR BLEEDING *URINARY PROBLEMS (pain or burning when urinating, or frequent urination) *BOWEL PROBLEMS (unusual diarrhea, constipation, pain near the anus) TENDERNESS IN MOUTH AND THROAT WITH OR WITHOUT PRESENCE OF ULCERS (sore throat, sores in mouth, or a toothache) UNUSUAL RASH, SWELLING OR PAIN  UNUSUAL VAGINAL DISCHARGE OR ITCHING   Items with * indicate a potential emergency and should be followed up as  soon as possible or go to the Emergency Department if any problems should occur.  Please show the CHEMOTHERAPY ALERT CARD or IMMUNOTHERAPY ALERT CARD at check-in to the Emergency Department and triage nurse.  Should you have questions after your visit or need to cancel or reschedule your appointment, please contact [facility] [place] - A DEPT OF Eligha Bridegroom [facility] [phone]  and follow the prompts.  Office hours are 8:00 a.m. to 4:30 p.m. Monday - Friday. Please note that voicemails left after 4:00 p.m. may not be returned until the following business day.  We are closed weekends and major holidays. You have access to a nurse at all times for urgent questions. Please call the main number to the clinic [phone] and follow the prompts.  For any non-urgent questions, you may also contact your provider using MyChart. We now offer e-Visits for anyone 72 and older to request care online for non-urgent symptoms. For details visit [email or web address].   Also download the MyChart app! Go to the app store, search "MyChart", open the app, select [place], and log in with your MyChart username and password.

## 2024-01-22 NOTE — Progress Notes (Signed)
Patient presents today for Vidaza infusion per providers order.  Vital signs within parameters for treatment.  Patient has no new complaints at this time.  Treatment given today per MD orders.  Stable during infusion without adverse affects.  Vital signs stable.  No complaints at this time.  Discharge from clinic ambulatory in stable condition.  Alert and oriented X 3.  Follow up with [facility] as scheduled.

## 2024-01-23 ENCOUNTER — Inpatient Hospital Stay

## 2024-01-23 VITALS — BP 153/94 | HR 55 | Temp 96.7°F | Resp 18

## 2024-01-23 DIAGNOSIS — Z5111 Encounter for antineoplastic chemotherapy: Secondary | ICD-10-CM | POA: Diagnosis not present

## 2024-01-23 DIAGNOSIS — D469 Myelodysplastic syndrome, unspecified: Secondary | ICD-10-CM

## 2024-01-23 DIAGNOSIS — Z95828 Presence of other vascular implants and grafts: Secondary | ICD-10-CM

## 2024-01-23 MED ORDER — DEXAMETHASONE SOD PHOSPHATE PF 10 MG/ML IJ SOLN: 10.0000 mg | Freq: Once | INTRAMUSCULAR | Status: DC

## 2024-01-23 MED ORDER — PALONOSETRON HCL INJECTION 0.25 MG/5ML
0.2500 mg | Freq: Once | INTRAVENOUS | Status: AC
  Administered 2024-01-23: 0.25 mg via INTRAVENOUS
  Filled 2024-01-23: qty 5

## 2024-01-23 MED ORDER — DEXAMETHASONE SOD PHOSPHATE PF 10 MG/ML IJ SOLN
10.0000 mg | Freq: Once | INTRAMUSCULAR | Status: DC
  Filled 2024-01-23: qty 1

## 2024-01-23 MED ORDER — SODIUM CHLORIDE 0.9 % IV SOLN: Freq: Once | INTRAVENOUS | Status: AC

## 2024-01-23 MED ORDER — DEXAMETHASONE SODIUM PHOSPHATE 10 MG/ML IJ SOLN
10.0000 mg | Freq: Once | INTRAMUSCULAR | Status: AC
  Administered 2024-01-23: 10 mg via INTRAVENOUS

## 2024-01-23 MED ORDER — SODIUM CHLORIDE 0.9 % IV SOLN
75.0000 mg/m2 | Freq: Once | INTRAVENOUS | Status: AC
  Administered 2024-01-23: 140 mg via INTRAVENOUS
  Filled 2024-01-23: qty 14

## 2024-01-23 NOTE — Progress Notes (Signed)
 No new complaints today since yesterday.    Treatment given per orders. Patient tolerated it well without problems. Vitals stable and discharged home from clinic ambulatory. Follow up as scheduled.

## 2024-01-23 NOTE — Patient Instructions (Signed)
 [facility] [place] - A DEPT OF [facility]  Discharge Instructions: Thank you for choosing [facility] to provide your oncology and hematology care.  If you have a lab appointment with the Cancer Center - please note that after April 8th, 2024, all labs will be drawn in the cancer center.  You do not have to check in or register with the main entrance as you have in the past but will complete your check-in in the cancer center.  Wear comfortable clothing and clothing appropriate for easy access to any Portacath or PICC line.   We strive to give you quality time with your provider. You may need to reschedule your appointment if you arrive late (15 or more minutes).  Arriving late affects you and other patients whose appointments are after yours.  Also, if you miss three or more appointments without notifying the office, you may be dismissed from the clinic at the provider's discretion.      For prescription refill requests, have your pharmacy contact our office and allow 72 hours for refills to be completed.    Today you received the following chemotherapy and/or immunotherapy agents azacitadine   To help prevent nausea and vomiting after your treatment, we encourage you to take your nausea medication as directed.  BELOW ARE SYMPTOMS THAT SHOULD BE REPORTED IMMEDIATELY: *FEVER GREATER THAN 100.4 F (38 C) OR HIGHER *CHILLS OR SWEATING *NAUSEA AND VOMITING THAT IS NOT CONTROLLED WITH YOUR NAUSEA MEDICATION *UNUSUAL SHORTNESS OF BREATH *UNUSUAL BRUISING OR BLEEDING *URINARY PROBLEMS (pain or burning when urinating, or frequent urination) *BOWEL PROBLEMS (unusual diarrhea, constipation, pain near the anus) TENDERNESS IN MOUTH AND THROAT WITH OR WITHOUT PRESENCE OF ULCERS (sore throat, sores in mouth, or a toothache) UNUSUAL RASH, SWELLING OR PAIN  UNUSUAL VAGINAL DISCHARGE OR ITCHING   Items with * indicate a potential emergency and should be followed up  as soon as possible or go to the Emergency Department if any problems should occur.  Please show the CHEMOTHERAPY ALERT CARD or IMMUNOTHERAPY ALERT CARD at check-in to the Emergency Department and triage nurse.  Should you have questions after your visit or need to cancel or reschedule your appointment, please contact [facility] [place] - A DEPT OF [facility] [phone]  and follow the prompts.  Office hours are 8:00 a.m. to 4:30 p.m. Monday - Friday. Please note that voicemails left after 4:00 p.m. may not be returned until the following business day.  We are closed weekends and major holidays. You have access to a nurse at all times for urgent questions. Please call the main number to the clinic [phone] and follow the prompts.  For any non-urgent questions, you may also contact your provider using MyChart. We now offer e-Visits for anyone 46 and older to request care online for non-urgent symptoms. For details visit [email or web address].   Also download the MyChart app! Go to the app store, search "MyChart", open the app, select [place], and log in with your MyChart username and password.

## 2024-02-10 ENCOUNTER — Other Ambulatory Visit: Payer: Self-pay

## 2024-02-11 ENCOUNTER — Other Ambulatory Visit: Payer: Self-pay | Admitting: Oncology

## 2024-02-11 ENCOUNTER — Other Ambulatory Visit: Payer: Self-pay

## 2024-02-11 DIAGNOSIS — D469 Myelodysplastic syndrome, unspecified: Secondary | ICD-10-CM

## 2024-02-16 ENCOUNTER — Other Ambulatory Visit (HOSPITAL_COMMUNITY): Payer: Self-pay

## 2024-02-16 ENCOUNTER — Other Ambulatory Visit: Payer: Self-pay | Admitting: Oncology

## 2024-02-16 ENCOUNTER — Other Ambulatory Visit: Payer: Self-pay

## 2024-02-16 DIAGNOSIS — D469 Myelodysplastic syndrome, unspecified: Secondary | ICD-10-CM

## 2024-02-16 MED ORDER — VENETOCLAX 100 MG PO TABS
200.0000 mg | ORAL_TABLET | Freq: Every day | ORAL | 1 refills | Status: AC
  Filled 2024-02-16: qty 28, 14d supply, fill #0
  Filled 2024-02-18 – 2024-02-24 (×2): qty 28, 42d supply, fill #0
  Filled 2024-03-30: qty 28, 30d supply, fill #1

## 2024-02-16 NOTE — Telephone Encounter (Signed)
 Patient is tolerating venetoclax  without complication.  Is to continue therapy per last OVN.

## 2024-02-18 ENCOUNTER — Other Ambulatory Visit: Payer: Self-pay

## 2024-02-18 ENCOUNTER — Other Ambulatory Visit (HOSPITAL_COMMUNITY): Payer: Self-pay

## 2024-02-18 NOTE — Progress Notes (Signed)
 Specialty Pharmacy Refill Coordination Note  Chad Lamb is a 78 y.o. male contacted today regarding refills of specialty medication(s) Venetoclax  (VENCLEXTA )   Patient requested Delivery   Delivery date: 02/25/24   [address], [place] [address]   Medication will be filled on: 02/24/24

## 2024-02-23 ENCOUNTER — Other Ambulatory Visit: Payer: Self-pay

## 2024-02-23 NOTE — Progress Notes (Signed)
 Specialty Pharmacy Ongoing Clinical Assessment Note  Chad Lamb is a 78 y.o. male who is being followed by the specialty pharmacy service for RxSp Oncology   Patient's specialty medication(s) reviewed today: Venetoclax  (VENCLEXTA )   Missed doses in the last 4 weeks: 0   Patient/Caregiver did not have any additional questions or concerns.   Therapeutic benefit summary: Patient is achieving benefit   Adverse events/side effects summary: No adverse events/side effects   Patient's therapy is appropriate to: Continue    Goals Addressed             This Visit's Progress    Stabilization of disease   On track    Patient is on track. Patient will maintain adherence. Per 01/19/24 office visit, white count and platelets are normal and hemoglobin is stable.         Follow up: 6 months  [facility]

## 2024-02-24 ENCOUNTER — Other Ambulatory Visit: Payer: Self-pay

## 2024-02-24 ENCOUNTER — Other Ambulatory Visit: Payer: Self-pay | Admitting: Oncology

## 2024-02-24 DIAGNOSIS — D469 Myelodysplastic syndrome, unspecified: Secondary | ICD-10-CM

## 2024-02-24 DIAGNOSIS — Z95828 Presence of other vascular implants and grafts: Secondary | ICD-10-CM

## 2024-02-29 ENCOUNTER — Other Ambulatory Visit: Payer: Self-pay

## 2024-02-29 NOTE — Progress Notes (Unsigned)
 Patient Care Team: [name], MD as PCP - General (Family Medicine) [name], MD (Family Medicine) Harvey Margo CROME, MD (Inactive) as Consulting Physician (Gastroenterology) Davonna Siad, MD as Consulting Physician (Oncology)  Clinic Day:  02/29/2024  Referring physician: [name], MD   CHIEF COMPLAINT:  CC: MDS  ASSESSMENT & PLAN:   Assessment & Plan: Chad Lamb  is a 78 y.o. 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 Had good response with azacitidine  and venetoclax  Currently on azacitidine  days 1-5 and venetoclax  200 mg days 1-14 of a 42-day cycle Last bone marrow biopsy was from March 2024 with very good response   - Labs reviewed today: CMP: Normal creatinine and normal LFTs.  CBC: Normal WBC, hemoglobin 12.2, platelets: 219 - Physical exam stable.  Proceed with azacitidine  today. - Continue venetoclax  200 mg days 1-14 of a 42-day cycle.   - Will consider bone marrow biopsy if there is significant change in the counts   Return to clinic in 6 weeks before next cycle  Acute deep vein thrombosis (DVT) of axillary vein of left upper extremity  Patient had an unprovoked clot previously Was on a prolonged period of Xarelto  but was discontinued for hemorrhoidal bleeding   - No evidence of recurrent clot  Neutropenia, unspecified type  Likely secondary to venetoclax   Normal WBC today   - Will continue to monitor counts at this time - Will consider bone marrow biopsy if it worsens.  Constipation, unspecified constipation type Patient reports constipation during cycles.   - Continue Colace as prescribed and MiraLAX as needed  Rash Patient has erythematous not papular rash on arms, hand, suprapubic area. Evaluated by dermatologist and was nonmalignant   - Improved with hydrocortisone  cream - Continue the hydrocortisone  cream twice a  day     The patient understands the plans discussed today and is in agreement with them.  He knows to contact our office if he develops concerns prior to his next appointment.  20 minutes of total time was spent for this patient encounter, including preparation, face-to-face counseling with the patient and coordination of care, physical exam, and documentation of the encounter.   Siad Davonna, MD  [facility] [place] - A DEPT OF [facility] [address] [place] [address] Dept: [phone] Dept Fax: [phone]   No orders of the defined types were placed in this encounter.    ONCOLOGY HISTORY:   I have reviewed his chart and materials related to his cancer extensively and collaborated history with the patient. Summary of oncologic history is as follows:  Myelodysplastic syndrome- EB2:  -01/05/2021: Presented with worsening leukopenia and thrombocytopenia - 06/04/2021: Flow cytometry: Increased metaplastic cells (10%).  Hypercellular bone marrow with MDS with excess blasts (MDS-EB-2).  Cellularity 50 to 70%.  Absent ring sideroblasts.  MDS FISH: Normal.  Normal male karyotype. IPSS-M score: 0.22- moderate high risk. - 06/18/2021: Mutations detected: ASXL1 - 07/30/2021-04/02/2022: Azacitidine  (days 1-7) every 28 days x 6 cycles - 01/04/2022: Flow cytometry: 3.3% CD4 positive myeloblasts.  Bone marrow consistent with persistent MDS.  Cellularity 40%.  Consistent with MDS with increased blasts. Absent ring sideroblasts normal MDS FISH.  Normal male karyotype. - 02/18/2022: Added Venetoclax  200mg  2 weeks on 2 weeks off - 06/05/2022: Flow cytometric: No significant CD34 positive blasts.  Bone marrow: Slightly hypercellular bone marrow for  age.  Consistent with residual MDS.  Absent ring sideroblasts.  Normal male karyotype. -04/29/2022- Current: Decreased azacitidine  to 5 days and continued venetoclax  200 mg days 1-14 of a  42-day cycle  Current Treatment: Azacitidine  (Days 1-5) + Venetoclax  200mg  days 1-14 of a 42 day cycle.    INTERVAL HISTORY:   Discussed the use of AI scribe software for clinical note transcription with the patient, who gave verbal consent to proceed.  History of Present Illness     I have reviewed the past medical history, past surgical history, social history and family history with the patient and they are unchanged from previous note.  ALLERGIES:  is allergic to cephalosporins, amoxapine and related, amoxicillin, cephalexin, clindamycin/lincomycin, sulfamethoxazole, and trimethoprim.  MEDICATIONS:  Current Outpatient Medications  Medication Sig Dispense Refill   albuterol  (PROVENTIL  HFA;VENTOLIN  HFA) 108 (90 Base) MCG/ACT inhaler Inhale 1-2 puffs into the lungs every 6 (six) hours as needed for wheezing or shortness of breath.     ALPRAZolam (XANAX) 0.5 MG tablet Take 0.5 mg by mouth 2 (two) times daily as needed for anxiety.     amLODipine (NORVASC) 2.5 MG tablet Take 2.5 mg by mouth daily.     azaCITIDine  5 mg/2 mLs in lactated ringers  infusion Inject into the vein daily. Days 1-7 every 28 days     DENTA 5000 PLUS 1.1 % CREA dental cream Take by mouth.     docusate sodium (COLACE) 100 MG capsule 1 capsule Orally daily (3 daily when on chemo)     fluticasone  furoate-vilanterol (BREO ELLIPTA) 200-25 MCG/ACT AEPB Inhale 1 puff into the lungs daily.     Lactulose  20 GM/30ML SOLN Take 30 mLs (20 g total) by mouth at bedtime. 473 mL 3   Lidocaine -Hydrocort , Perianal, 3-0.5 % CREA Apply topically 2 (two) times daily as needed.     Lidocaine -Hydrocortisone  Ace 3-0.5 % CREA Apply 1 application topically as directed. 30 Tube 11   lidocaine -prilocaine  (EMLA ) cream Apply a SMALL AMOUNT TO port a cath site (DO not RUB in) AND cover with PLASTIC WRAP 1 hour prior TO infusion appointment 30 g 3   loratadine (CLARITIN) 10 MG tablet Take 10 mg by mouth daily.     Lutein 40 MG CAPS Take 40 mg by  mouth daily.     Multiple Vitamins-Minerals (CENTRUM ADULTS PO) Take 1 tablet by mouth daily.     nystatin cream (MYCOSTATIN) Apply 1 application. topically 2 (two) times daily as needed for dry skin.     ofloxacin (OCUFLOX) 0.3 % ophthalmic solution Place 1 drop into the left eye See admin instructions. 1 drop to left eye 4 x daily for 7 days after monthly procedure.     omeprazole (PRILOSEC OTC) 20 MG tablet Take 5-10 mg by mouth daily.     prochlorperazine  (COMPAZINE ) 10 MG tablet Take 1 tablet (10 mg total) by mouth every 6 (six) hours as needed (Nausea or vomiting). 30 tablet 2   sildenafil (VIAGRA) 50 MG tablet Take 50 mg by mouth daily as needed for erectile dysfunction.     traMADol  (ULTRAM ) 50 MG tablet Take 1 tablet (50 mg total) by mouth every 6 (six) hours as needed. 15 tablet 0   venetoclax  (VENCLEXTA ) 100 MG tablet Take 2 tablets (200 mg total) by mouth daily. Take for 14 days, then hold for 28 days. Repeat every 42 days. Take with a meal and a full glass of water . 28 tablet 1   No current facility-administered medications for this visit.  Facility-Administered Medications Ordered in Other Visits  Medication Dose Route Frequency Provider Last Rate Last Admin   sodium chloride  flush (NS) 0.9 % injection 10 mL  10 mL Intracatheter PRN Rogers Hai, MD   10 mL at 06/12/22 1606    REVIEW OF SYSTEMS:   Constitutional: Denies fevers, chills or abnormal weight loss Eyes: Denies blurriness of vision Ears, nose, mouth, throat, and face: Denies mucositis or sore throat Respiratory: Denies cough, dyspnea or wheezes Cardiovascular: Denies palpitation, chest discomfort or lower extremity swelling Gastrointestinal:  Denies nausea, heartburn or change in bowel habits Skin: Denies abnormal skin rashes Lymphatics: Denies new lymphadenopathy or easy bruising Neurological:Denies numbness, tingling or new weaknesses Behavioral/Psych: Mood is stable, no new changes  All other systems  were reviewed with the patient and are negative.   VITALS:  There were no vitals taken for this visit.  Wt Readings from Last 3 Encounters:  01/19/24 150 lb 5.7 oz (68.2 kg)  12/08/23 152 lb (68.9 kg)  10/27/23 150 lb 12.8 oz (68.4 kg)    There is no height or weight on file to calculate BMI.  Performance status (ECOG): 1 - Symptomatic but completely ambulatory  PHYSICAL EXAM:   GENERAL:alert, no distress and comfortable SKIN: skin color, texture, turgor are normal, no rashes or significant lesions LYMPH:  no palpable lymphadenopathy in the cervical, axillary or inguinal LUNGS: clear to auscultation and percussion with normal breathing effort HEART: regular rate & rhythm and no murmurs and no lower extremity edema ABDOMEN:abdomen soft, non-tender and normal bowel sounds Musculoskeletal:no cyanosis of digits and no clubbing  NEURO: alert & oriented x 3 with fluent speech  LABORATORY DATA:  I have reviewed the data as listed    Component Value Date/Time   NA 139 01/19/2024 0935   K 3.7 01/19/2024 0935   CL 104 01/19/2024 0935   CO2 25 01/19/2024 0935   GLUCOSE 118 (H) 01/19/2024 0935   BUN 11 01/19/2024 0935   CREATININE 1.09 01/19/2024 0935   CREATININE 0.98 02/12/2017 1421   CALCIUM 9.6 01/19/2024 0935   PROT 6.7 01/19/2024 0935   ALBUMIN 4.3 01/19/2024 0935   AST 21 01/19/2024 0935   ALT 16 01/19/2024 0935   ALKPHOS 95 01/19/2024 0935   BILITOT 1.1 01/19/2024 0935   GFRNONAA >60 01/19/2024 0935   GFRNONAA 77 02/12/2017 1421   GFRAA >60 11/19/2017 1532   GFRAA 89 02/12/2017 1421     Lab Results  Component Value Date   WBC 7.1 01/19/2024   NEUTROABS 4.1 01/19/2024   HGB 12.6 (L) 01/19/2024   HCT 38.2 (L) 01/19/2024   MCV 82.5 01/19/2024   PLT 203 01/19/2024      Chemistry      Component Value Date/Time   NA 139 01/19/2024 0935   K 3.7 01/19/2024 0935   CL 104 01/19/2024 0935   CO2 25 01/19/2024 0935   BUN 11 01/19/2024 0935   CREATININE 1.09  01/19/2024 0935   CREATININE 0.98 02/12/2017 1421      Component Value Date/Time   CALCIUM 9.6 01/19/2024 0935   ALKPHOS 95 01/19/2024 0935   AST 21 01/19/2024 0935   ALT 16 01/19/2024 0935   BILITOT 1.1 01/19/2024 0935       RADIOGRAPHIC STUDIES: I have personally reviewed the radiological images as listed and agreed with the findings in the report.  None new to review

## 2024-03-01 ENCOUNTER — Inpatient Hospital Stay

## 2024-03-01 ENCOUNTER — Inpatient Hospital Stay: Attending: Hematology | Admitting: Oncology

## 2024-03-01 ENCOUNTER — Inpatient Hospital Stay: Attending: Hematology

## 2024-03-01 VITALS — BP 141/88 | HR 50 | Temp 96.2°F | Resp 18 | Wt 148.6 lb

## 2024-03-01 DIAGNOSIS — D4622 Refractory anemia with excess of blasts 2: Secondary | ICD-10-CM | POA: Insufficient documentation

## 2024-03-01 DIAGNOSIS — I82A12 Acute embolism and thrombosis of left axillary vein: Secondary | ICD-10-CM | POA: Diagnosis not present

## 2024-03-01 DIAGNOSIS — D469 Myelodysplastic syndrome, unspecified: Secondary | ICD-10-CM | POA: Diagnosis not present

## 2024-03-01 DIAGNOSIS — Z5111 Encounter for antineoplastic chemotherapy: Secondary | ICD-10-CM | POA: Insufficient documentation

## 2024-03-01 DIAGNOSIS — R21 Rash and other nonspecific skin eruption: Secondary | ICD-10-CM | POA: Diagnosis not present

## 2024-03-01 DIAGNOSIS — Z95828 Presence of other vascular implants and grafts: Secondary | ICD-10-CM

## 2024-03-01 DIAGNOSIS — K5900 Constipation, unspecified: Secondary | ICD-10-CM

## 2024-03-01 DIAGNOSIS — D709 Neutropenia, unspecified: Secondary | ICD-10-CM | POA: Diagnosis not present

## 2024-03-01 LAB — COMPREHENSIVE METABOLIC PANEL WITH GFR
ALT: 12 U/L (ref 0–44)
AST: 20 U/L (ref 15–41)
Albumin: 4.4 g/dL (ref 3.5–5.0)
Alkaline Phosphatase: 91 U/L (ref 38–126)
Anion gap: 6 (ref 5–15)
BUN: 11 mg/dL (ref 8–23)
CO2: 31 mmol/L (ref 22–32)
Calcium: 9.8 mg/dL (ref 8.9–10.3)
Chloride: 106 mmol/L (ref 98–111)
Creatinine, Ser: 1 mg/dL (ref 0.61–1.24)
GFR, Estimated: >60 mL/min (ref >60)
Glucose, Bld: 106 mg/dL — ABNORMAL HIGH (ref 70–99)
Potassium: 3.7 mmol/L (ref 3.5–5.1)
Sodium: 143 mmol/L (ref 135–145)
Total Bilirubin: 0.7 mg/dL (ref 0.0–1.2)
Total Protein: 6.4 g/dL — ABNORMAL LOW (ref 6.5–8.1)

## 2024-03-01 LAB — CBC WITH DIFFERENTIAL/PLATELET
Abs Immature Granulocytes: 0.02 K/uL (ref 0.00–0.07)
Basophils Absolute: 0 K/uL (ref 0.0–0.1)
Basophils Relative: 1 %
Eosinophils Absolute: 0.7 K/uL — ABNORMAL HIGH (ref 0.0–0.5)
Eosinophils Relative: 12 %
HCT: 37.2 % — ABNORMAL LOW (ref 39.0–52.0)
Hemoglobin: 12.1 g/dL — ABNORMAL LOW (ref 13.0–17.0)
Immature Granulocytes: 0 %
Lymphocytes Relative: 21 %
Lymphs Abs: 1.2 K/uL (ref 0.7–4.0)
MCH: 27.1 pg (ref 26.0–34.0)
MCHC: 32.5 g/dL (ref 30.0–36.0)
MCV: 83.4 fL (ref 80.0–100.0)
Monocytes Absolute: 0.8 K/uL (ref 0.1–1.0)
Monocytes Relative: 15 %
Neutro Abs: 3 K/uL (ref 1.7–7.7)
Neutrophils Relative %: 51 %
Platelets: 188 K/uL (ref 150–400)
RBC: 4.46 MIL/uL (ref 4.22–5.81)
RDW: 17.1 % — ABNORMAL HIGH (ref 11.5–15.5)
WBC: 5.8 K/uL (ref 4.0–10.5)
nRBC: 0 % (ref 0.0–0.2)

## 2024-03-01 LAB — MAGNESIUM: Magnesium: 2.2 mg/dL (ref 1.7–2.4)

## 2024-03-01 MED ORDER — SODIUM CHLORIDE 0.9 % IV SOLN: Freq: Once | INTRAVENOUS | Status: AC

## 2024-03-01 MED ORDER — DEXAMETHASONE SOD PHOSPHATE PF 10 MG/ML IJ SOLN
10.0000 mg | Freq: Once | INTRAMUSCULAR | Status: AC
  Administered 2024-03-01: 09:00:00 10 mg via INTRAVENOUS

## 2024-03-01 MED ORDER — SODIUM CHLORIDE 0.9 % IV SOLN
75.0000 mg/m2 | Freq: Once | INTRAVENOUS | Status: AC
  Administered 2024-03-01: 10:00:00 140 mg via INTRAVENOUS
  Filled 2024-03-01: qty 14

## 2024-03-01 MED ORDER — OCTREOTIDE ACETATE 30 MG IM KIT
PACK | INTRAMUSCULAR | Status: AC
  Filled 2024-03-01: qty 1

## 2024-03-01 MED ORDER — PALONOSETRON HCL INJECTION 0.25 MG/5ML
0.2500 mg | Freq: Once | INTRAVENOUS | Status: AC
  Administered 2024-03-01: 09:00:00 0.25 mg via INTRAVENOUS
  Filled 2024-03-01: qty 5

## 2024-03-01 NOTE — Progress Notes (Signed)
 Patient presents today for Vidaza infusion per providers order.  Vital signs and labs reviewed by MD.  Message received from Dr. Anders Simmonds patient okay for treatment.    Treatment given today per MD orders.  Stable during infusion without adverse affects.  Vital signs stable.  No complaints at this time.  Discharge from clinic ambulatory in stable condition.  Alert and oriented X 3.  Follow up with [facility] as scheduled.

## 2024-03-01 NOTE — Patient Instructions (Signed)
 [facility] [place] - A DEPT OF [facility]  Discharge Instructions: Thank you for choosing [facility] to provide your oncology and hematology care.  If you have a lab appointment with the Cancer Center - please note that after April 8th, 2024, all labs will be drawn in the cancer center.  You do not have to check in or register with the main entrance as you have in the past but will complete your check-in in the cancer center.  Wear comfortable clothing and clothing appropriate for easy access to any Portacath or PICC line.   We strive to give you quality time with your provider. You may need to reschedule your appointment if you arrive late (15 or more minutes).  Arriving late affects you and other patients whose appointments are after yours.  Also, if you miss three or more appointments without notifying the office, you may be dismissed from the clinic at the provider's discretion.      For prescription refill requests, have your pharmacy contact our office and allow 72 hours for refills to be completed.    Today you received the following chemotherapy and/or immunotherapy agents Vidaza      To help prevent nausea and vomiting after your treatment, we encourage you to take your nausea medication as directed.  BELOW ARE SYMPTOMS THAT SHOULD BE REPORTED IMMEDIATELY: *FEVER GREATER THAN 100.4 F (38 C) OR HIGHER *CHILLS OR SWEATING *NAUSEA AND VOMITING THAT IS NOT CONTROLLED WITH YOUR NAUSEA MEDICATION *UNUSUAL SHORTNESS OF BREATH *UNUSUAL BRUISING OR BLEEDING *URINARY PROBLEMS (pain or burning when urinating, or frequent urination) *BOWEL PROBLEMS (unusual diarrhea, constipation, pain near the anus) TENDERNESS IN MOUTH AND THROAT WITH OR WITHOUT PRESENCE OF ULCERS (sore throat, sores in mouth, or a toothache) UNUSUAL RASH, SWELLING OR PAIN  UNUSUAL VAGINAL DISCHARGE OR ITCHING   Items with * indicate a potential emergency and should be followed up as  soon as possible or go to the Emergency Department if any problems should occur.  Please show the CHEMOTHERAPY ALERT CARD or IMMUNOTHERAPY ALERT CARD at check-in to the Emergency Department and triage nurse.  Should you have questions after your visit or need to cancel or reschedule your appointment, please contact [facility] [place] - A DEPT OF Eligha Bridegroom [facility] [phone]  and follow the prompts.  Office hours are 8:00 a.m. to 4:30 p.m. Monday - Friday. Please note that voicemails left after 4:00 p.m. may not be returned until the following business day.  We are closed weekends and major holidays. You have access to a nurse at all times for urgent questions. Please call the main number to the clinic [phone] and follow the prompts.  For any non-urgent questions, you may also contact your provider using MyChart. We now offer e-Visits for anyone 72 and older to request care online for non-urgent symptoms. For details visit [email or web address].   Also download the MyChart app! Go to the app store, search "MyChart", open the app, select [place], and log in with your MyChart username and password.

## 2024-03-02 ENCOUNTER — Inpatient Hospital Stay

## 2024-03-02 VITALS — BP 127/78 | HR 56 | Temp 96.9°F | Resp 16

## 2024-03-02 DIAGNOSIS — Z95828 Presence of other vascular implants and grafts: Secondary | ICD-10-CM

## 2024-03-02 DIAGNOSIS — D469 Myelodysplastic syndrome, unspecified: Secondary | ICD-10-CM

## 2024-03-02 DIAGNOSIS — Z5111 Encounter for antineoplastic chemotherapy: Secondary | ICD-10-CM | POA: Diagnosis not present

## 2024-03-02 MED ORDER — SODIUM CHLORIDE 0.9 % IV SOLN
75.0000 mg/m2 | Freq: Once | INTRAVENOUS | Status: AC
  Administered 2024-03-02: 11:00:00 140 mg via INTRAVENOUS
  Filled 2024-03-02: qty 14

## 2024-03-02 MED ORDER — DEXAMETHASONE SOD PHOSPHATE PF 10 MG/ML IJ SOLN
10.0000 mg | Freq: Once | INTRAMUSCULAR | Status: AC
  Administered 2024-03-02: 11:00:00 10 mg via INTRAVENOUS

## 2024-03-02 MED ORDER — SODIUM CHLORIDE 0.9 % IV SOLN: Freq: Once | INTRAVENOUS | Status: AC

## 2024-03-02 NOTE — Patient Instructions (Signed)
 [facility] [place] - A DEPT OF [facility]  Discharge Instructions: Thank you for choosing [facility] to provide your oncology and hematology care.  If you have a lab appointment with the Cancer Center - please note that after April 8th, 2024, all labs will be drawn in the cancer center.  You do not have to check in or register with the main entrance as you have in the past but will complete your check-in in the cancer center.  Wear comfortable clothing and clothing appropriate for easy access to any Portacath or PICC line.   We strive to give you quality time with your provider. You may need to reschedule your appointment if you arrive late (15 or more minutes).  Arriving late affects you and other patients whose appointments are after yours.  Also, if you miss three or more appointments without notifying the office, you may be dismissed from the clinic at the providers discretion.      For prescription refill requests, have your pharmacy contact our office and allow 72 hours for refills to be completed.    Today you received the following chemotherapy and/or immunotherapy agents D2 Vidaza       To help prevent nausea and vomiting after your treatment, we encourage you to take your nausea medication as directed.  BELOW ARE SYMPTOMS THAT SHOULD BE REPORTED IMMEDIATELY: *FEVER GREATER THAN 100.4 F (38 C) OR HIGHER *CHILLS OR SWEATING *NAUSEA AND VOMITING THAT IS NOT CONTROLLED WITH YOUR NAUSEA MEDICATION *UNUSUAL SHORTNESS OF BREATH *UNUSUAL BRUISING OR BLEEDING *URINARY PROBLEMS (pain or burning when urinating, or frequent urination) *BOWEL PROBLEMS (unusual diarrhea, constipation, pain near the anus) TENDERNESS IN MOUTH AND THROAT WITH OR WITHOUT PRESENCE OF ULCERS (sore throat, sores in mouth, or a toothache) UNUSUAL RASH, SWELLING OR PAIN  UNUSUAL VAGINAL DISCHARGE OR ITCHING   Items with * indicate a potential emergency and should be followed up  as soon as possible or go to the Emergency Department if any problems should occur.  Please show the CHEMOTHERAPY ALERT CARD or IMMUNOTHERAPY ALERT CARD at check-in to the Emergency Department and triage nurse.  Should you have questions after your visit or need to cancel or reschedule your appointment, please contact [facility] [place] - A DEPT OF [facility] [phone]  and follow the prompts.  Office hours are 8:00 a.m. to 4:30 p.m. Monday - Friday. Please note that voicemails left after 4:00 p.m. may not be returned until the following business day.  We are closed weekends and major holidays. You have access to a nurse at all times for urgent questions. Please call the main number to the clinic [phone] and follow the prompts.  For any non-urgent questions, you may also contact your provider using MyChart. We now offer e-Visits for anyone 63 and older to request care online for non-urgent symptoms. For details visit [email or web address].   Also download the MyChart app! Go to the app store, search MyChart, open the app, select [facility], and log in with your MyChart username and password.

## 2024-03-02 NOTE — Progress Notes (Signed)
 Patient presents today for chemotherapy D2 Vidaza  infusion.  Patient is in satisfactory condition with no new complaints voiced.  Vital signs are stable.  Labs reviewed and all labs are within treatment parameters.  We will proceed with treatment per MD orders.    Treatment given today per MD orders. Tolerated infusion without adverse affects. Vital signs stable. No complaints at this time. Discharged from clinic ambulatory in stable condition. Alert and oriented x 3. F/U with [facility] as scheduled.

## 2024-03-03 ENCOUNTER — Inpatient Hospital Stay

## 2024-03-03 VITALS — BP 145/72 | HR 55 | Temp 98.2°F | Resp 16

## 2024-03-03 DIAGNOSIS — Z5111 Encounter for antineoplastic chemotherapy: Secondary | ICD-10-CM | POA: Diagnosis not present

## 2024-03-03 DIAGNOSIS — D469 Myelodysplastic syndrome, unspecified: Secondary | ICD-10-CM

## 2024-03-03 DIAGNOSIS — Z95828 Presence of other vascular implants and grafts: Secondary | ICD-10-CM

## 2024-03-03 MED ORDER — PALONOSETRON HCL INJECTION 0.25 MG/5ML
0.2500 mg | Freq: Once | INTRAVENOUS | Status: AC
  Administered 2024-03-03: 10:00:00 0.25 mg via INTRAVENOUS
  Filled 2024-03-03: qty 5

## 2024-03-03 MED ORDER — SODIUM CHLORIDE 0.9 % IV SOLN: Freq: Once | INTRAVENOUS | Status: AC

## 2024-03-03 MED ORDER — DEXAMETHASONE SOD PHOSPHATE PF 10 MG/ML IJ SOLN
10.0000 mg | Freq: Once | INTRAMUSCULAR | Status: AC
  Administered 2024-03-03: 10:00:00 10 mg via INTRAVENOUS

## 2024-03-03 MED ORDER — SODIUM CHLORIDE 0.9 % IV SOLN
75.0000 mg/m2 | Freq: Once | INTRAVENOUS | Status: AC
  Administered 2024-03-03: 11:00:00 140 mg via INTRAVENOUS
  Filled 2024-03-03: qty 14

## 2024-03-03 NOTE — Patient Instructions (Signed)
 [facility] [place] - A DEPT OF [facility]  Discharge Instructions: Thank you for choosing [facility] to provide your oncology and hematology care.  If you have a lab appointment with the Cancer Center - please note that after April 8th, 2024, all labs will be drawn in the cancer center.  You do not have to check in or register with the main entrance as you have in the past but will complete your check-in in the cancer center.  Wear comfortable clothing and clothing appropriate for easy access to any Portacath or PICC line.   We strive to give you quality time with your provider. You may need to reschedule your appointment if you arrive late (15 or more minutes).  Arriving late affects you and other patients whose appointments are after yours.  Also, if you miss three or more appointments without notifying the office, you may be dismissed from the clinic at the providers discretion.      For prescription refill requests, have your pharmacy contact our office and allow 72 hours for refills to be completed.    Today you received the following chemotherapy and/or immunotherapy agents azacitiadine   To help prevent nausea and vomiting after your treatment, we encourage you to take your nausea medication as directed.  BELOW ARE SYMPTOMS THAT SHOULD BE REPORTED IMMEDIATELY: *FEVER GREATER THAN 100.4 F (38 C) OR HIGHER *CHILLS OR SWEATING *NAUSEA AND VOMITING THAT IS NOT CONTROLLED WITH YOUR NAUSEA MEDICATION *UNUSUAL SHORTNESS OF BREATH *UNUSUAL BRUISING OR BLEEDING *URINARY PROBLEMS (pain or burning when urinating, or frequent urination) *BOWEL PROBLEMS (unusual diarrhea, constipation, pain near the anus) TENDERNESS IN MOUTH AND THROAT WITH OR WITHOUT PRESENCE OF ULCERS (sore throat, sores in mouth, or a toothache) UNUSUAL RASH, SWELLING OR PAIN  UNUSUAL VAGINAL DISCHARGE OR ITCHING   Items with * indicate a potential emergency and should be followed up  as soon as possible or go to the Emergency Department if any problems should occur.  Please show the CHEMOTHERAPY ALERT CARD or IMMUNOTHERAPY ALERT CARD at check-in to the Emergency Department and triage nurse.  Should you have questions after your visit or need to cancel or reschedule your appointment, please contact [facility] [place] - A DEPT OF [facility] [phone]  and follow the prompts.  Office hours are 8:00 a.m. to 4:30 p.m. Monday - Friday. Please note that voicemails left after 4:00 p.m. may not be returned until the following business day.  We are closed weekends and major holidays. You have access to a nurse at all times for urgent questions. Please call the main number to the clinic [phone] and follow the prompts.  For any non-urgent questions, you may also contact your provider using MyChart. We now offer e-Visits for anyone 57 and older to request care online for non-urgent symptoms. For details visit [email or web address].   Also download the MyChart app! Go to the app store, search MyChart, open the app, select [facility], and log in with your MyChart username and password.

## 2024-03-03 NOTE — Progress Notes (Signed)
Treatment given per orders. Patient tolerated it well without problems. Vitals stable and discharged home from clinic ambulatory. Follow up as scheduled.  

## 2024-03-04 ENCOUNTER — Inpatient Hospital Stay

## 2024-03-04 VITALS — BP 146/88 | HR 62 | Temp 97.8°F | Resp 17

## 2024-03-04 DIAGNOSIS — Z95828 Presence of other vascular implants and grafts: Secondary | ICD-10-CM

## 2024-03-04 DIAGNOSIS — Z5111 Encounter for antineoplastic chemotherapy: Secondary | ICD-10-CM | POA: Diagnosis not present

## 2024-03-04 DIAGNOSIS — D469 Myelodysplastic syndrome, unspecified: Secondary | ICD-10-CM

## 2024-03-04 MED ORDER — SODIUM CHLORIDE 0.9 % IV SOLN
75.0000 mg/m2 | Freq: Once | INTRAVENOUS | Status: AC
  Administered 2024-03-04: 11:00:00 140 mg via INTRAVENOUS
  Filled 2024-03-04: qty 14

## 2024-03-04 MED ORDER — DEXAMETHASONE SOD PHOSPHATE PF 10 MG/ML IJ SOLN
10.0000 mg | Freq: Once | INTRAMUSCULAR | Status: AC
  Administered 2024-03-04: 11:00:00 10 mg via INTRAVENOUS

## 2024-03-04 MED ORDER — SODIUM CHLORIDE 0.9 % IV SOLN: Freq: Once | INTRAVENOUS | Status: AC

## 2024-03-04 NOTE — Patient Instructions (Signed)
 [facility] [place] - A DEPT OF [facility]  Discharge Instructions: Thank you for choosing [facility] to provide your oncology and hematology care.  If you have a lab appointment with the Cancer Center - please note that after April 8th, 2024, all labs will be drawn in the cancer center.  You do not have to check in or register with the main entrance as you have in the past but will complete your check-in in the cancer center.  Wear comfortable clothing and clothing appropriate for easy access to any Portacath or PICC line.   We strive to give you quality time with your provider. You may need to reschedule your appointment if you arrive late (15 or more minutes).  Arriving late affects you and other patients whose appointments are after yours.  Also, if you miss three or more appointments without notifying the office, you may be dismissed from the clinic at the provider's discretion.      For prescription refill requests, have your pharmacy contact our office and allow 72 hours for refills to be completed.    Today you received the following chemotherapy and/or immunotherapy agents Vidaza      To help prevent nausea and vomiting after your treatment, we encourage you to take your nausea medication as directed.  BELOW ARE SYMPTOMS THAT SHOULD BE REPORTED IMMEDIATELY: *FEVER GREATER THAN 100.4 F (38 C) OR HIGHER *CHILLS OR SWEATING *NAUSEA AND VOMITING THAT IS NOT CONTROLLED WITH YOUR NAUSEA MEDICATION *UNUSUAL SHORTNESS OF BREATH *UNUSUAL BRUISING OR BLEEDING *URINARY PROBLEMS (pain or burning when urinating, or frequent urination) *BOWEL PROBLEMS (unusual diarrhea, constipation, pain near the anus) TENDERNESS IN MOUTH AND THROAT WITH OR WITHOUT PRESENCE OF ULCERS (sore throat, sores in mouth, or a toothache) UNUSUAL RASH, SWELLING OR PAIN  UNUSUAL VAGINAL DISCHARGE OR ITCHING   Items with * indicate a potential emergency and should be followed up as  soon as possible or go to the Emergency Department if any problems should occur.  Please show the CHEMOTHERAPY ALERT CARD or IMMUNOTHERAPY ALERT CARD at check-in to the Emergency Department and triage nurse.  Should you have questions after your visit or need to cancel or reschedule your appointment, please contact [facility] [place] - A DEPT OF Eligha Bridegroom [facility] [phone]  and follow the prompts.  Office hours are 8:00 a.m. to 4:30 p.m. Monday - Friday. Please note that voicemails left after 4:00 p.m. may not be returned until the following business day.  We are closed weekends and major holidays. You have access to a nurse at all times for urgent questions. Please call the main number to the clinic [phone] and follow the prompts.  For any non-urgent questions, you may also contact your provider using MyChart. We now offer e-Visits for anyone 72 and older to request care online for non-urgent symptoms. For details visit [email or web address].   Also download the MyChart app! Go to the app store, search "MyChart", open the app, select [place], and log in with your MyChart username and password.

## 2024-03-04 NOTE — Progress Notes (Signed)
Vidaza given today per MD orders.  Stable during infusion without adverse affects.  Vital signs stable.  No complaints at this time.  Discharge from clinic ambulatory in stable condition.  Alert and oriented X 3.  Follow up with [facility] as scheduled.

## 2024-03-05 ENCOUNTER — Inpatient Hospital Stay

## 2024-03-05 VITALS — BP 146/80 | HR 62 | Temp 98.2°F | Resp 16

## 2024-03-05 DIAGNOSIS — Z5111 Encounter for antineoplastic chemotherapy: Secondary | ICD-10-CM | POA: Diagnosis not present

## 2024-03-05 DIAGNOSIS — Z95828 Presence of other vascular implants and grafts: Secondary | ICD-10-CM

## 2024-03-05 DIAGNOSIS — D469 Myelodysplastic syndrome, unspecified: Secondary | ICD-10-CM

## 2024-03-05 MED ORDER — DEXAMETHASONE SOD PHOSPHATE PF 10 MG/ML IJ SOLN
10.0000 mg | Freq: Once | INTRAMUSCULAR | Status: AC
  Administered 2024-03-05: 10 mg via INTRAVENOUS

## 2024-03-05 MED ORDER — SODIUM CHLORIDE 0.9 % IV SOLN
75.0000 mg/m2 | Freq: Once | INTRAVENOUS | Status: AC
  Administered 2024-03-05: 140 mg via INTRAVENOUS
  Filled 2024-03-05: qty 14

## 2024-03-05 MED ORDER — PALONOSETRON HCL INJECTION 0.25 MG/5ML
0.2500 mg | Freq: Once | INTRAVENOUS | Status: AC
  Administered 2024-03-05: 0.25 mg via INTRAVENOUS
  Filled 2024-03-05: qty 5

## 2024-03-05 MED ORDER — SODIUM CHLORIDE 0.9 % IV SOLN: Freq: Once | INTRAVENOUS | Status: AC

## 2024-03-05 NOTE — Patient Instructions (Signed)
 [facility] [place] [facility]  Discharge Instructions: Thank you for choosing [facility] to provide your oncology and hematology care.  If you have a lab appointment with the Cancer Center - please note that after April 8th, 2024, all labs will be drawn in the cancer center.  You do not have to check in or register with the main entrance as you have in the past but will complete your check-in in the cancer center.  Wear comfortable clothing and clothing appropriate for easy access to any Portacath or PICC line.   We strive to give you quality time with your provider. You may need to reschedule your appointment if you arrive late (15 or more minutes).  Arriving late affects you and other patients whose appointments are after yours.  Also, if you miss three or more appointments without notifying the office, you may be dismissed from the clinic at the provider's discretion.      For prescription refill requests, have your pharmacy contact our office and allow 72 hours for refills to be completed.    Today you received the following chemotherapy and/or immunotherapy agents vidaza.       To help prevent nausea and vomiting after your treatment, we encourage you to take your nausea medication as directed.  BELOW ARE SYMPTOMS THAT SHOULD BE REPORTED IMMEDIATELY: *FEVER GREATER THAN 100.4 F (38 C) OR HIGHER *CHILLS OR SWEATING *NAUSEA AND VOMITING THAT IS NOT CONTROLLED WITH YOUR NAUSEA MEDICATION *UNUSUAL SHORTNESS OF BREATH *UNUSUAL BRUISING OR BLEEDING *URINARY PROBLEMS (pain or burning when urinating, or frequent urination) *BOWEL PROBLEMS (unusual diarrhea, constipation, pain near the anus) TENDERNESS IN MOUTH AND THROAT WITH OR WITHOUT PRESENCE OF ULCERS (sore throat, sores in mouth, or a toothache) UNUSUAL RASH, SWELLING OR PAIN  UNUSUAL VAGINAL DISCHARGE OR ITCHING   Items with * indicate a potential emergency and should be followed up  as soon as possible or go to the Emergency Department if any problems should occur.  Please show the CHEMOTHERAPY ALERT CARD or IMMUNOTHERAPY ALERT CARD at check-in to the Emergency Department and triage nurse.  Should you have questions after your visit or need to cancel or reschedule your appointment, please contact [facility] [place] - A DEPT OF [facility]ideg[facility] [phone]  and follow the prompts.  Office hours are 8:00 a.m. to 4:30 p.m. Monday - Friday. Please note that voicemails left after 4:00 p.m. may not be returned until the following business day.  We are closed weekends and major holidays. You have access to a nurse at all times for urgent questions. Please call the main number to the clinic [phone] and follow the prompts.  For any non-urgent questions, you may also contact your provider using MyChart. We now offer e-Visits for anyone 8 and older to request care online for non-urgent symptoms. For details visit [email or web address].   Also download the MyChart app! Go to the app store, search "MyChart", open the app, select [place], and log in with your MyChart username and password.

## 2024-03-05 NOTE — Progress Notes (Signed)
 Patient tolerated therapy with no complaints voiced.  Side effects with management reviewed with understanding verbalized.  Port site clean and dry with no bruising or swelling noted at site.  Good blood return noted before and after administration of therapy.  Band aid applied.  Patient left in satisfactory condition with VSS and no s/s of distress noted.

## 2024-03-15 ENCOUNTER — Encounter: Payer: Self-pay | Admitting: *Deleted

## 2024-03-30 ENCOUNTER — Other Ambulatory Visit: Payer: Self-pay

## 2024-03-30 ENCOUNTER — Encounter: Payer: Self-pay | Admitting: Oncology

## 2024-04-01 ENCOUNTER — Other Ambulatory Visit: Payer: Self-pay

## 2024-04-05 ENCOUNTER — Other Ambulatory Visit: Payer: Self-pay

## 2024-04-05 ENCOUNTER — Encounter: Payer: Self-pay | Admitting: Oncology

## 2024-04-05 NOTE — Progress Notes (Signed)
 Specialty Pharmacy Refill Coordination Note  Chad Lamb is a 79 y.o. male contacted today regarding refills of specialty medication(s) Venetoclax  (VENCLEXTA )   Patient requested Delivery   Delivery date: 04/07/24   Verified address: [address] [place], [place] [address]   Medication will be filled on: 04/06/24

## 2024-04-06 ENCOUNTER — Other Ambulatory Visit: Payer: Self-pay

## 2024-04-09 ENCOUNTER — Other Ambulatory Visit: Payer: Self-pay

## 2024-04-10 ENCOUNTER — Other Ambulatory Visit: Payer: Self-pay

## 2024-04-12 ENCOUNTER — Inpatient Hospital Stay: Admitting: Oncology

## 2024-04-12 ENCOUNTER — Inpatient Hospital Stay

## 2024-04-12 NOTE — Progress Notes (Signed)
 " Patient Care Team: Chad Millman, Lamb as PCP - General (Family Medicine) Chad Millman, Lamb (Family Medicine) Chad Margo CROME, Lamb (Inactive) as Consulting Physician (Gastroenterology) Chad Siad, Lamb as Consulting Physician (Oncology)  Clinic Day:  04/13/2024  Referring physician: [name], Lamb   CHIEF COMPLAINT:  CC: MDS  ASSESSMENT & PLAN:   Assessment & Plan: Chad Lamb  is a 79 y.o. 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 Had good response with azacitidine  and venetoclax  Currently on azacitidine  days 1-5 and venetoclax  200 mg days 1-14 of a 42-day cycle Last bone marrow biopsy was from March 2024 with very good response   - Labs reviewed today: CMP: Normal creatinine and normal LFTs.  CBC: Normal WBC, hemoglobin 12.5, platelets: 185 - Physical exam stable.  Proceed with azacitidine  today. - Continue venetoclax  200 mg days 1-14 of a 42-day cycle.   - Will consider bone marrow biopsy if there is significant change in the counts  Return to clinic in 6 weeks before next cycle  Acute deep vein thrombosis (DVT) of axillary vein of left upper extremity  Patient had an unprovoked clot previously Was on a prolonged period of Xarelto  but was discontinued for hemorrhoidal bleeding   - No evidence of recurrent clot   Constipation, unspecified constipation type Patient reports constipation during cycles.   - Continue Colace as prescribed and MiraLAX as needed  Rash Patient has erythematous not papular rash on arms, hand, suprapubic area. Evaluated by dermatologist and was nonmalignant   - Improved with hydrocortisone  cream - Continue the hydrocortisone  cream twice a day  The patient understands the plans discussed today and is in agreement with them.  He knows to contact our office if he develops concerns prior to his next appointment.  12 minutes of total time was  spent for this patient encounter, including preparation, face-to-face counseling with the patient and coordination of care, physical exam, and documentation of the encounter.    Chad Lamb,acting as a neurosurgeon for Lamb Davonna, Lamb.,have documented all relevant documentation on the behalf of Lamb Davonna, Lamb,as directed by  Lamb Davonna, Lamb while in the presence of Lamb Davonna, Lamb.  I, Chad Lamb, have reviewed the above documentation for accuracy and completeness, and I agree with the above.    Lamb Davonna, Lamb  [facility] [place] - A DEPT OF Chad Lamb [address] [place] [address] Dept: [phone] Dept Fax: [phone]   No orders of the defined types were placed in this encounter.    ONCOLOGY HISTORY:   I have reviewed his chart and materials related to his cancer extensively and collaborated history with the patient. Summary of oncologic history is as follows:  Myelodysplastic syndrome- EB2:  -01/05/2021: Presented with worsening leukopenia and thrombocytopenia - 06/04/2021: Flow cytometry: Increased metaplastic cells (10%).  Hypercellular bone marrow with MDS with excess blasts (MDS-EB-2).  Cellularity 50 to 70%.  Absent ring sideroblasts.  MDS FISH: Normal.  Normal male karyotype. IPSS-M score: 0.22- moderate high risk. - 06/18/2021: Mutations detected: ASXL1 - 07/30/2021-04/02/2022: Azacitidine  (days 1-7) every 28 days x 6 cycles - 01/04/2022: Flow cytometry: 3.3% CD4 positive myeloblasts.  Bone marrow consistent with persistent MDS.  Cellularity 40%.  Consistent with MDS with increased blasts. Absent ring sideroblasts normal MDS FISH.  Normal male karyotype. - 02/18/2022: Added Venetoclax  200mg  2  weeks on 2 weeks off - 06/05/2022: Flow cytometric: No significant CD34 positive blasts.  Bone marrow: Slightly hypercellular bone marrow for age.  Consistent with residual MDS.  Absent ring  sideroblasts.  Normal male karyotype. -04/29/2022- Current: Decreased azacitidine  to 5 days and continued venetoclax  200 mg days 1-14 of a 42-day cycle  Current Treatment: Azacitidine  (Days 1-5) + Venetoclax  200mg  days 1-14 of a 42 day cycle.    INTERVAL HISTORY:   Discussed the use of AI scribe software for clinical note transcription with the patient, who gave verbal consent to proceed.  History of Present Illness Chad Lamb is a 79 year old male with myelodysplastic syndrome who presents for routine hematology/oncology follow-up and pre-treatment assessment.  He is undergoing regular laboratory monitoring and treatment for myelodysplastic syndrome. Hemoglobin today is 12.5 g/dL and renal function is stable.  He denies fever, chills, and significant fatigue. He reports a reduction in baseline functional capacity since his cancer diagnosis, stating, 'I can do things but I can't do things the way I used to.' No new symptoms or acute concerns were reported.   I have reviewed the past medical history, past surgical history, social history and family history with the patient and they are unchanged from previous note.  ALLERGIES:  is allergic to cephalosporins, amoxapine and related, amoxicillin, cephalexin, clindamycin/lincomycin, sulfamethoxazole, and trimethoprim.  MEDICATIONS:  Current Outpatient Medications  Medication Sig Dispense Refill   albuterol  (PROVENTIL  HFA;VENTOLIN  HFA) 108 (90 Base) MCG/ACT inhaler Inhale 1-2 puffs into the lungs every 6 (six) hours as needed for wheezing or shortness of breath.     ALPRAZolam (XANAX) 0.5 MG tablet Take 0.5 mg by mouth 2 (two) times daily as needed for anxiety.     amLODipine (NORVASC) 2.5 MG tablet Take 2.5 mg by mouth daily.     azaCITIDine  5 mg/2 mLs in lactated ringers  infusion Inject into the vein daily. Days 1-7 every 28 days     DENTA 5000 PLUS 1.1 % CREA dental cream Take by mouth.     dexamethasone  (DECADRON ) 0.5 MG/5ML solution 10  mLs.     docusate sodium (COLACE) 100 MG capsule 1 capsule Orally daily (3 daily when on chemo)     fluticasone  furoate-vilanterol (BREO ELLIPTA) 200-25 MCG/ACT AEPB Inhale 1 puff into the lungs daily.     Lactulose  20 GM/30ML SOLN Take 30 mLs (20 g total) by mouth at bedtime. 473 mL 3   Lidocaine -Hydrocort , Perianal, 3-0.5 % CREA Apply topically 2 (two) times daily as needed.     Lidocaine -Hydrocortisone  Ace 3-0.5 % CREA Apply 1 application topically as directed. 30 Tube 11   lidocaine -prilocaine  (EMLA ) cream Apply a SMALL AMOUNT TO port a cath site (DO not RUB in) AND cover with PLASTIC WRAP 1 hour prior TO infusion appointment 30 g 3   loratadine (CLARITIN) 10 MG tablet Take 10 mg by mouth daily.     Lutein 40 MG CAPS Take 40 mg by mouth daily.     Multiple Vitamins-Minerals (CENTRUM ADULTS PO) Take 1 tablet by mouth daily.     nystatin cream (MYCOSTATIN) Apply 1 application. topically 2 (two) times daily as needed for dry skin.     ofloxacin (OCUFLOX) 0.3 % ophthalmic solution Place 1 drop into the left eye See admin instructions. 1 drop to left eye 4 x daily for 7 days after monthly procedure.     omeprazole (PRILOSEC OTC) 20 MG tablet Take 5-10 mg by mouth daily.     prochlorperazine  (COMPAZINE ) 10 MG tablet  Take 1 tablet (10 mg total) by mouth every 6 (six) hours as needed (Nausea or vomiting). 30 tablet 2   sildenafil (VIAGRA) 50 MG tablet Take 50 mg by mouth daily as needed for erectile dysfunction.     traMADol  (ULTRAM ) 50 MG tablet Take 1 tablet (50 mg total) by mouth every 6 (six) hours as needed. 15 tablet 0   venetoclax  (VENCLEXTA ) 100 MG tablet Take 2 tablets (200 mg total) by mouth daily. Take for 14 days, then hold for 28 days. Repeat every 42 days. Take with a meal and a full glass of water . 28 tablet 1   No current facility-administered medications for this visit.   Facility-Administered Medications Ordered in Other Visits  Medication Dose Route Frequency Provider Last Rate  Last Admin   sodium chloride  flush (NS) 0.9 % injection 10 mL  10 mL Intracatheter PRN Rogers Hai, Lamb   10 mL at 06/12/22 1606    VITALS:  There were no vitals taken for this visit.  Wt Readings from Last 3 Encounters:  04/13/24 149 lb 3.2 oz (67.7 kg)  03/01/24 148 lb 9.4 oz (67.4 kg)  01/19/24 150 lb 5.7 oz (68.2 kg)    There is no height or weight on file to calculate BMI.  Performance status (ECOG): 1 - Symptomatic but completely ambulatory  PHYSICAL EXAM:   GENERAL:alert, no distress and comfortable SKIN: skin color, texture, turgor are normal, no rashes or significant lesions LYMPH:  no palpable lymphadenopathy in the cervical, axillary or inguinal LUNGS: clear to auscultation and percussion with normal breathing effort HEART: regular rate & rhythm and no murmurs and no lower extremity edema ABDOMEN:abdomen soft, non-tender and normal bowel sounds Musculoskeletal:no cyanosis of digits and no clubbing  NEURO: alert & oriented x 3 with fluent speech  LABORATORY DATA:  I have reviewed the data as listed    Component Value Date/Time   NA 143 04/13/2024 1142   K 3.7 04/13/2024 1142   CL 107 04/13/2024 1142   CO2 24 04/13/2024 1142   GLUCOSE 114 (H) 04/13/2024 1142   BUN 9 04/13/2024 1142   CREATININE 1.02 04/13/2024 1142   CREATININE 0.98 02/12/2017 1421   CALCIUM 9.7 04/13/2024 1142   PROT 6.5 04/13/2024 1142   ALBUMIN 4.3 04/13/2024 1142   AST 22 04/13/2024 1142   ALT 14 04/13/2024 1142   ALKPHOS 84 04/13/2024 1142   BILITOT 1.2 04/13/2024 1142   GFRNONAA >60 04/13/2024 1142   GFRNONAA 77 02/12/2017 1421   GFRAA >60 11/19/2017 1532   GFRAA 89 02/12/2017 1421     Lab Results  Component Value Date   WBC 5.3 04/13/2024   NEUTROABS 3.1 04/13/2024   HGB 12.5 (L) 04/13/2024   HCT 38.9 (L) 04/13/2024   MCV 85.1 04/13/2024   PLT 185 04/13/2024      Chemistry      Component Value Date/Time   NA 143 04/13/2024 1142   K 3.7 04/13/2024 1142   CL 107  04/13/2024 1142   CO2 24 04/13/2024 1142   BUN 9 04/13/2024 1142   CREATININE 1.02 04/13/2024 1142   CREATININE 0.98 02/12/2017 1421      Component Value Date/Time   CALCIUM 9.7 04/13/2024 1142   ALKPHOS 84 04/13/2024 1142   AST 22 04/13/2024 1142   ALT 14 04/13/2024 1142   BILITOT 1.2 04/13/2024 1142       RADIOGRAPHIC STUDIES: I have personally reviewed the radiological images as listed and agreed with the findings in  the report.  None new to review    "

## 2024-04-13 ENCOUNTER — Inpatient Hospital Stay: Attending: Hematology

## 2024-04-13 ENCOUNTER — Encounter: Payer: Self-pay | Admitting: Oncology

## 2024-04-13 ENCOUNTER — Inpatient Hospital Stay

## 2024-04-13 ENCOUNTER — Inpatient Hospital Stay (HOSPITAL_BASED_OUTPATIENT_CLINIC_OR_DEPARTMENT_OTHER): Admitting: Oncology

## 2024-04-13 VITALS — BP 142/81 | HR 55 | Temp 97.8°F | Resp 16

## 2024-04-13 DIAGNOSIS — I82A12 Acute embolism and thrombosis of left axillary vein: Secondary | ICD-10-CM | POA: Diagnosis not present

## 2024-04-13 DIAGNOSIS — D469 Myelodysplastic syndrome, unspecified: Secondary | ICD-10-CM | POA: Diagnosis not present

## 2024-04-13 DIAGNOSIS — D4622 Refractory anemia with excess of blasts 2: Secondary | ICD-10-CM | POA: Diagnosis present

## 2024-04-13 DIAGNOSIS — Z95828 Presence of other vascular implants and grafts: Secondary | ICD-10-CM

## 2024-04-13 DIAGNOSIS — Z5111 Encounter for antineoplastic chemotherapy: Secondary | ICD-10-CM | POA: Insufficient documentation

## 2024-04-13 DIAGNOSIS — R21 Rash and other nonspecific skin eruption: Secondary | ICD-10-CM | POA: Diagnosis not present

## 2024-04-13 DIAGNOSIS — K5900 Constipation, unspecified: Secondary | ICD-10-CM | POA: Diagnosis not present

## 2024-04-13 LAB — CBC WITH DIFFERENTIAL/PLATELET
Abs Immature Granulocytes: 0.01 10*3/uL (ref 0.00–0.07)
Basophils Absolute: 0 10*3/uL (ref 0.0–0.1)
Basophils Relative: 1 %
Eosinophils Absolute: 0.4 10*3/uL (ref 0.0–0.5)
Eosinophils Relative: 8 %
HCT: 38.9 % — ABNORMAL LOW (ref 39.0–52.0)
Hemoglobin: 12.5 g/dL — ABNORMAL LOW (ref 13.0–17.0)
Immature Granulocytes: 0 %
Lymphocytes Relative: 21 %
Lymphs Abs: 1.1 10*3/uL (ref 0.7–4.0)
MCH: 27.4 pg (ref 26.0–34.0)
MCHC: 32.1 g/dL (ref 30.0–36.0)
MCV: 85.1 fL (ref 80.0–100.0)
Monocytes Absolute: 0.6 10*3/uL (ref 0.1–1.0)
Monocytes Relative: 11 %
Neutro Abs: 3.1 10*3/uL (ref 1.7–7.7)
Neutrophils Relative %: 59 %
Platelets: 185 10*3/uL (ref 150–400)
RBC: 4.57 MIL/uL (ref 4.22–5.81)
RDW: 17.1 % — ABNORMAL HIGH (ref 11.5–15.5)
WBC: 5.3 10*3/uL (ref 4.0–10.5)
nRBC: 0 % (ref 0.0–0.2)

## 2024-04-13 LAB — COMPREHENSIVE METABOLIC PANEL WITH GFR
ALT: 14 U/L (ref 0–44)
AST: 22 U/L (ref 15–41)
Albumin: 4.3 g/dL (ref 3.5–5.0)
Alkaline Phosphatase: 84 U/L (ref 38–126)
Anion gap: 12 (ref 5–15)
BUN: 9 mg/dL (ref 8–23)
CO2: 24 mmol/L (ref 22–32)
Calcium: 9.7 mg/dL (ref 8.9–10.3)
Chloride: 107 mmol/L (ref 98–111)
Creatinine, Ser: 1.02 mg/dL (ref 0.61–1.24)
GFR, Estimated: >60 mL/min
Glucose, Bld: 114 mg/dL — ABNORMAL HIGH (ref 70–99)
Potassium: 3.7 mmol/L (ref 3.5–5.1)
Sodium: 143 mmol/L (ref 135–145)
Total Bilirubin: 1.2 mg/dL (ref 0.0–1.2)
Total Protein: 6.5 g/dL (ref 6.5–8.1)

## 2024-04-13 LAB — MAGNESIUM: Magnesium: 2.3 mg/dL (ref 1.7–2.4)

## 2024-04-13 MED ORDER — SODIUM CHLORIDE 0.9 % IV SOLN: Freq: Once | INTRAVENOUS | Status: AC

## 2024-04-13 MED ORDER — SODIUM CHLORIDE 0.9 % IV SOLN
75.0000 mg/m2 | Freq: Once | INTRAVENOUS | Status: AC
  Administered 2024-04-13: 140 mg via INTRAVENOUS
  Filled 2024-04-13: qty 14

## 2024-04-13 MED ORDER — PALONOSETRON HCL INJECTION 0.25 MG/5ML
0.2500 mg | Freq: Once | INTRAVENOUS | Status: AC
  Administered 2024-04-13: 0.25 mg via INTRAVENOUS
  Filled 2024-04-13: qty 5

## 2024-04-13 MED ORDER — DEXAMETHASONE SOD PHOSPHATE PF 10 MG/ML IJ SOLN
10.0000 mg | Freq: Once | INTRAMUSCULAR | Status: AC
  Administered 2024-04-13: 10 mg via INTRAVENOUS
  Filled 2024-04-13: qty 1

## 2024-04-13 NOTE — Patient Instructions (Signed)
 [facility] [place] [facility]  Discharge Instructions: Thank you for choosing [facility] to provide your oncology and hematology care.  If you have a lab appointment with the Cancer Center - please note that after April 8th, 2024, all labs will be drawn in the cancer center.  You do not have to check in or register with the main entrance as you have in the past but will complete your check-in in the cancer center.  Wear comfortable clothing and clothing appropriate for easy access to any Portacath or PICC line.   We strive to give you quality time with your provider. You may need to reschedule your appointment if you arrive late (15 or more minutes).  Arriving late affects you and other patients whose appointments are after yours.  Also, if you miss three or more appointments without notifying the office, you may be dismissed from the clinic at the provider's discretion.      For prescription refill requests, have your pharmacy contact our office and allow 72 hours for refills to be completed.    Today you received the following chemotherapy and/or immunotherapy agents vidaza.       To help prevent nausea and vomiting after your treatment, we encourage you to take your nausea medication as directed.  BELOW ARE SYMPTOMS THAT SHOULD BE REPORTED IMMEDIATELY: *FEVER GREATER THAN 100.4 F (38 C) OR HIGHER *CHILLS OR SWEATING *NAUSEA AND VOMITING THAT IS NOT CONTROLLED WITH YOUR NAUSEA MEDICATION *UNUSUAL SHORTNESS OF BREATH *UNUSUAL BRUISING OR BLEEDING *URINARY PROBLEMS (pain or burning when urinating, or frequent urination) *BOWEL PROBLEMS (unusual diarrhea, constipation, pain near the anus) TENDERNESS IN MOUTH AND THROAT WITH OR WITHOUT PRESENCE OF ULCERS (sore throat, sores in mouth, or a toothache) UNUSUAL RASH, SWELLING OR PAIN  UNUSUAL VAGINAL DISCHARGE OR ITCHING   Items with * indicate a potential emergency and should be followed up  as soon as possible or go to the Emergency Department if any problems should occur.  Please show the CHEMOTHERAPY ALERT CARD or IMMUNOTHERAPY ALERT CARD at check-in to the Emergency Department and triage nurse.  Should you have questions after your visit or need to cancel or reschedule your appointment, please contact [facility] [place] - A DEPT OF [facility]ideg[facility] [phone]  and follow the prompts.  Office hours are 8:00 a.m. to 4:30 p.m. Monday - Friday. Please note that voicemails left after 4:00 p.m. may not be returned until the following business day.  We are closed weekends and major holidays. You have access to a nurse at all times for urgent questions. Please call the main number to the clinic [phone] and follow the prompts.  For any non-urgent questions, you may also contact your provider using MyChart. We now offer e-Visits for anyone 8 and older to request care online for non-urgent symptoms. For details visit [email or web address].   Also download the MyChart app! Go to the app store, search "MyChart", open the app, select [place], and log in with your MyChart username and password.

## 2024-04-13 NOTE — Patient Instructions (Signed)

## 2024-04-13 NOTE — Progress Notes (Signed)
 Patient has been examined by Dr. Davonna. Vital signs and labs have been reviewed by MD - ANC, Creatinine, LFTs, hemoglobin, and platelets have been reviewed by M.D. - pt may proceed with treatment.  Primary RN and pharmacy notified.

## 2024-04-13 NOTE — Progress Notes (Signed)

## 2024-04-14 ENCOUNTER — Inpatient Hospital Stay

## 2024-04-14 ENCOUNTER — Other Ambulatory Visit: Payer: Self-pay | Admitting: *Deleted

## 2024-04-14 VITALS — BP 133/75 | HR 62 | Temp 97.2°F | Resp 18

## 2024-04-14 DIAGNOSIS — Z5111 Encounter for antineoplastic chemotherapy: Secondary | ICD-10-CM | POA: Diagnosis not present

## 2024-04-14 DIAGNOSIS — D469 Myelodysplastic syndrome, unspecified: Secondary | ICD-10-CM

## 2024-04-14 DIAGNOSIS — Z95828 Presence of other vascular implants and grafts: Secondary | ICD-10-CM

## 2024-04-14 MED ORDER — DEXAMETHASONE SOD PHOSPHATE PF 10 MG/ML IJ SOLN
10.0000 mg | Freq: Once | INTRAMUSCULAR | Status: AC
  Administered 2024-04-14: 10 mg via INTRAVENOUS
  Filled 2024-04-14: qty 1

## 2024-04-14 MED ORDER — SODIUM CHLORIDE 0.9 % IV SOLN
75.0000 mg/m2 | Freq: Once | INTRAVENOUS | Status: AC
  Administered 2024-04-14: 140 mg via INTRAVENOUS
  Filled 2024-04-14: qty 14

## 2024-04-14 MED ORDER — SODIUM CHLORIDE 0.9 % IV SOLN: Freq: Once | INTRAVENOUS | Status: AC

## 2024-04-14 NOTE — Progress Notes (Signed)
 SPIRITUAL CARE AND COUNSELING CONSULT NOTE   VISIT SUMMARY   Reason for Visit: Chaplain making scheduled follow-up with Pt I previously connected with.  Description of Visit: I entered the room and found Chad Lamb sitting in the chair receiving treatment, with no else there as a support person.     I wanted to meet with Dempsey to process the losses of 2 patients from our clinic that I know he was close with.  Nassir is a wise man and shared similar faith with these patients and he is handling his grief well and with maturity.   Dempsey and I then spoke at length about certain political issues that he holds near, and issues of the h&r block as well.  We practiced theological exploration and encouraged one another.  Plan of Care: I will continue to follow Gery on a monthly basis.   SPIRITUAL ENCOUNTER                                                                                                                                                                      Type of Visit: Follow up Care provided to:: Patient Referral source: Chaplain assessment Reason for visit: Routine spiritual support OnCall Visit: No   SPIRITUAL FRAMEWORK  Presenting Themes: Meaning/purpose/sources of inspiration, Values and beliefs, Impactful experiences and emotions, Rituals and practive, Community and relationships Values/beliefs: [employer] and is part of a [employer] order Community/Connection: Family, Friend(s), Faith community Strengths: Spirituality, Love, Curiosity, and Hope Needs/Challenges/Barriers: None identified at this time Patient Stress Factors: None identified Family Stress Factors: None identified   GOALS   Self/Personal Goals: Iren wants to continue to impact and relate to people Clinical Care Goals: provide space conducive to spititual care   INTERVENTIONS   Spiritual Care Interventions Made: Compassionate presence, Narrative/life review, Explored  values/beliefs/practices/strengths, Encouragement, Supported grief process    INTERVENTION OUTCOMES   Outcomes: Connection to spiritual care, Awareness of support, Reduced isolation  SPIRITUAL CARE PLAN   Spiritual Care Issues Still Outstanding: Chaplain will continue to follow   Maude Roll, MDiv Chaplain, [facility] [email or web address] [phone] 04/14/2024 2:16 PM

## 2024-04-14 NOTE — Progress Notes (Signed)
Patient presents today for Vidaza infusion per providers order.  Vital signs within parameters for treatment.  Patient has no new complaints at this time.  Treatment given today per MD orders.  Stable during infusion without adverse affects.  Vital signs stable.  No complaints at this time.  Discharge from clinic ambulatory in stable condition.  Alert and oriented X 3.  Follow up with [facility] as scheduled.

## 2024-04-14 NOTE — Patient Instructions (Signed)
 [facility] [place] - A DEPT OF [facility]  Discharge Instructions: Thank you for choosing [facility] to provide your oncology and hematology care.  If you have a lab appointment with the Cancer Center - please note that after April 8th, 2024, all labs will be drawn in the cancer center.  You do not have to check in or register with the main entrance as you have in the past but will complete your check-in in the cancer center.  Wear comfortable clothing and clothing appropriate for easy access to any Portacath or PICC line.   We strive to give you quality time with your provider. You may need to reschedule your appointment if you arrive late (15 or more minutes).  Arriving late affects you and other patients whose appointments are after yours.  Also, if you miss three or more appointments without notifying the office, you may be dismissed from the clinic at the provider's discretion.      For prescription refill requests, have your pharmacy contact our office and allow 72 hours for refills to be completed.    Today you received the following chemotherapy and/or immunotherapy agents Vidaza      To help prevent nausea and vomiting after your treatment, we encourage you to take your nausea medication as directed.  BELOW ARE SYMPTOMS THAT SHOULD BE REPORTED IMMEDIATELY: *FEVER GREATER THAN 100.4 F (38 C) OR HIGHER *CHILLS OR SWEATING *NAUSEA AND VOMITING THAT IS NOT CONTROLLED WITH YOUR NAUSEA MEDICATION *UNUSUAL SHORTNESS OF BREATH *UNUSUAL BRUISING OR BLEEDING *URINARY PROBLEMS (pain or burning when urinating, or frequent urination) *BOWEL PROBLEMS (unusual diarrhea, constipation, pain near the anus) TENDERNESS IN MOUTH AND THROAT WITH OR WITHOUT PRESENCE OF ULCERS (sore throat, sores in mouth, or a toothache) UNUSUAL RASH, SWELLING OR PAIN  UNUSUAL VAGINAL DISCHARGE OR ITCHING   Items with * indicate a potential emergency and should be followed up as  soon as possible or go to the Emergency Department if any problems should occur.  Please show the CHEMOTHERAPY ALERT CARD or IMMUNOTHERAPY ALERT CARD at check-in to the Emergency Department and triage nurse.  Should you have questions after your visit or need to cancel or reschedule your appointment, please contact [facility] [place] - A DEPT OF Eligha Bridegroom [facility] [phone]  and follow the prompts.  Office hours are 8:00 a.m. to 4:30 p.m. Monday - Friday. Please note that voicemails left after 4:00 p.m. may not be returned until the following business day.  We are closed weekends and major holidays. You have access to a nurse at all times for urgent questions. Please call the main number to the clinic [phone] and follow the prompts.  For any non-urgent questions, you may also contact your provider using MyChart. We now offer e-Visits for anyone 72 and older to request care online for non-urgent symptoms. For details visit [email or web address].   Also download the MyChart app! Go to the app store, search "MyChart", open the app, select [place], and log in with your MyChart username and password.

## 2024-04-15 ENCOUNTER — Inpatient Hospital Stay

## 2024-04-15 VITALS — BP 149/86 | HR 56 | Temp 96.4°F | Resp 18

## 2024-04-15 DIAGNOSIS — Z5111 Encounter for antineoplastic chemotherapy: Secondary | ICD-10-CM | POA: Diagnosis not present

## 2024-04-15 DIAGNOSIS — D469 Myelodysplastic syndrome, unspecified: Secondary | ICD-10-CM

## 2024-04-15 DIAGNOSIS — Z95828 Presence of other vascular implants and grafts: Secondary | ICD-10-CM

## 2024-04-15 MED ORDER — SODIUM CHLORIDE 0.9 % IV SOLN
75.0000 mg/m2 | Freq: Once | INTRAVENOUS | Status: AC
  Administered 2024-04-15: 140 mg via INTRAVENOUS
  Filled 2024-04-15: qty 14

## 2024-04-15 MED ORDER — SODIUM CHLORIDE 0.9 % IV SOLN: Freq: Once | INTRAVENOUS | Status: AC

## 2024-04-15 MED ORDER — DEXAMETHASONE SOD PHOSPHATE PF 10 MG/ML IJ SOLN
10.0000 mg | Freq: Once | INTRAMUSCULAR | Status: AC
  Administered 2024-04-15: 10 mg via INTRAVENOUS
  Filled 2024-04-15: qty 1

## 2024-04-15 MED ORDER — PALONOSETRON HCL INJECTION 0.25 MG/5ML
0.2500 mg | Freq: Once | INTRAVENOUS | Status: AC
  Administered 2024-04-15: 0.25 mg via INTRAVENOUS
  Filled 2024-04-15: qty 5

## 2024-04-15 NOTE — Patient Instructions (Signed)
 [facility] [place] - A DEPT OF [facility]  Discharge Instructions: Thank you for choosing [facility] to provide your oncology and hematology care.  If you have a lab appointment with the Cancer Center - please note that after April 8th, 2024, all labs will be drawn in the cancer center.  You do not have to check in or register with the main entrance as you have in the past but will complete your check-in in the cancer center.  Wear comfortable clothing and clothing appropriate for easy access to any Portacath or PICC line.   We strive to give you quality time with your provider. You may need to reschedule your appointment if you arrive late (15 or more minutes).  Arriving late affects you and other patients whose appointments are after yours.  Also, if you miss three or more appointments without notifying the office, you may be dismissed from the clinic at the provider's discretion.      For prescription refill requests, have your pharmacy contact our office and allow 72 hours for refills to be completed.    Today you received the following chemotherapy and/or immunotherapy agents Vidaza      To help prevent nausea and vomiting after your treatment, we encourage you to take your nausea medication as directed.  BELOW ARE SYMPTOMS THAT SHOULD BE REPORTED IMMEDIATELY: *FEVER GREATER THAN 100.4 F (38 C) OR HIGHER *CHILLS OR SWEATING *NAUSEA AND VOMITING THAT IS NOT CONTROLLED WITH YOUR NAUSEA MEDICATION *UNUSUAL SHORTNESS OF BREATH *UNUSUAL BRUISING OR BLEEDING *URINARY PROBLEMS (pain or burning when urinating, or frequent urination) *BOWEL PROBLEMS (unusual diarrhea, constipation, pain near the anus) TENDERNESS IN MOUTH AND THROAT WITH OR WITHOUT PRESENCE OF ULCERS (sore throat, sores in mouth, or a toothache) UNUSUAL RASH, SWELLING OR PAIN  UNUSUAL VAGINAL DISCHARGE OR ITCHING   Items with * indicate a potential emergency and should be followed up as  soon as possible or go to the Emergency Department if any problems should occur.  Please show the CHEMOTHERAPY ALERT CARD or IMMUNOTHERAPY ALERT CARD at check-in to the Emergency Department and triage nurse.  Should you have questions after your visit or need to cancel or reschedule your appointment, please contact [facility] [place] - A DEPT OF Eligha Bridegroom [facility] [phone]  and follow the prompts.  Office hours are 8:00 a.m. to 4:30 p.m. Monday - Friday. Please note that voicemails left after 4:00 p.m. may not be returned until the following business day.  We are closed weekends and major holidays. You have access to a nurse at all times for urgent questions. Please call the main number to the clinic [phone] and follow the prompts.  For any non-urgent questions, you may also contact your provider using MyChart. We now offer e-Visits for anyone 72 and older to request care online for non-urgent symptoms. For details visit [email or web address].   Also download the MyChart app! Go to the app store, search "MyChart", open the app, select [place], and log in with your MyChart username and password.

## 2024-04-15 NOTE — Progress Notes (Signed)
Patient presents today for Vidaza infusion per providers order.  Vital signs within parameters for treatment.  Patient has no new complaints at this time.  Treatment given today per MD orders.  Stable during infusion without adverse affects.  Vital signs stable.  No complaints at this time.  Discharge from clinic ambulatory in stable condition.  Alert and oriented X 3.  Follow up with [facility] as scheduled.

## 2024-04-16 ENCOUNTER — Inpatient Hospital Stay

## 2024-04-16 VITALS — BP 138/94 | HR 60 | Temp 97.0°F | Resp 18

## 2024-04-16 DIAGNOSIS — D469 Myelodysplastic syndrome, unspecified: Secondary | ICD-10-CM

## 2024-04-16 DIAGNOSIS — Z5111 Encounter for antineoplastic chemotherapy: Secondary | ICD-10-CM | POA: Diagnosis not present

## 2024-04-16 DIAGNOSIS — Z95828 Presence of other vascular implants and grafts: Secondary | ICD-10-CM

## 2024-04-16 MED ORDER — SODIUM CHLORIDE 0.9 % IV SOLN
75.0000 mg/m2 | Freq: Once | INTRAVENOUS | Status: AC
  Administered 2024-04-16: 140 mg via INTRAVENOUS
  Filled 2024-04-16: qty 14

## 2024-04-16 MED ORDER — SODIUM CHLORIDE 0.9 % IV SOLN: Freq: Once | INTRAVENOUS | Status: AC

## 2024-04-16 MED ORDER — DEXAMETHASONE SOD PHOSPHATE PF 10 MG/ML IJ SOLN
10.0000 mg | Freq: Once | INTRAMUSCULAR | Status: AC
  Administered 2024-04-16: 10 mg via INTRAVENOUS
  Filled 2024-04-16: qty 1

## 2024-04-16 NOTE — Progress Notes (Signed)
 Treatment given today per MD orders.  Stable during infusion without adverse affects.  Vital signs stable.  No complaints at this time.  Discharge from clinic ambulatory in stable condition.  Alert and oriented X 3.  Follow up with [facility] as scheduled.

## 2024-04-16 NOTE — Patient Instructions (Signed)
 [facility] [place] - A DEPT OF [facility]  Discharge Instructions: Thank you for choosing [facility] to provide your oncology and hematology care.  If you have a lab appointment with the Cancer Center - please note that after April 8th, 2024, all labs will be drawn in the cancer center.  You do not have to check in or register with the main entrance as you have in the past but will complete your check-in in the cancer center.  Wear comfortable clothing and clothing appropriate for easy access to any Portacath or PICC line.   We strive to give you quality time with your provider. You may need to reschedule your appointment if you arrive late (15 or more minutes).  Arriving late affects you and other patients whose appointments are after yours.  Also, if you miss three or more appointments without notifying the office, you may be dismissed from the clinic at the provider's discretion.      For prescription refill requests, have your pharmacy contact our office and allow 72 hours for refills to be completed.    Today you received the following chemotherapy and/or immunotherapy agents Vidaza      To help prevent nausea and vomiting after your treatment, we encourage you to take your nausea medication as directed.  BELOW ARE SYMPTOMS THAT SHOULD BE REPORTED IMMEDIATELY: *FEVER GREATER THAN 100.4 F (38 C) OR HIGHER *CHILLS OR SWEATING *NAUSEA AND VOMITING THAT IS NOT CONTROLLED WITH YOUR NAUSEA MEDICATION *UNUSUAL SHORTNESS OF BREATH *UNUSUAL BRUISING OR BLEEDING *URINARY PROBLEMS (pain or burning when urinating, or frequent urination) *BOWEL PROBLEMS (unusual diarrhea, constipation, pain near the anus) TENDERNESS IN MOUTH AND THROAT WITH OR WITHOUT PRESENCE OF ULCERS (sore throat, sores in mouth, or a toothache) UNUSUAL RASH, SWELLING OR PAIN  UNUSUAL VAGINAL DISCHARGE OR ITCHING   Items with * indicate a potential emergency and should be followed up as  soon as possible or go to the Emergency Department if any problems should occur.  Please show the CHEMOTHERAPY ALERT CARD or IMMUNOTHERAPY ALERT CARD at check-in to the Emergency Department and triage nurse.  Should you have questions after your visit or need to cancel or reschedule your appointment, please contact [facility] [place] - A DEPT OF Eligha Bridegroom [facility] [phone]  and follow the prompts.  Office hours are 8:00 a.m. to 4:30 p.m. Monday - Friday. Please note that voicemails left after 4:00 p.m. may not be returned until the following business day.  We are closed weekends and major holidays. You have access to a nurse at all times for urgent questions. Please call the main number to the clinic [phone] and follow the prompts.  For any non-urgent questions, you may also contact your provider using MyChart. We now offer e-Visits for anyone 72 and older to request care online for non-urgent symptoms. For details visit [email or web address].   Also download the MyChart app! Go to the app store, search "MyChart", open the app, select [place], and log in with your MyChart username and password.

## 2024-04-17 ENCOUNTER — Inpatient Hospital Stay

## 2024-04-17 VITALS — BP 149/88 | HR 68 | Temp 97.6°F | Resp 18

## 2024-04-17 DIAGNOSIS — Z5111 Encounter for antineoplastic chemotherapy: Secondary | ICD-10-CM | POA: Diagnosis not present

## 2024-04-17 DIAGNOSIS — D469 Myelodysplastic syndrome, unspecified: Secondary | ICD-10-CM

## 2024-04-17 DIAGNOSIS — Z95828 Presence of other vascular implants and grafts: Secondary | ICD-10-CM

## 2024-04-17 MED ORDER — SODIUM CHLORIDE 0.9 % IV SOLN
75.0000 mg/m2 | Freq: Once | INTRAVENOUS | Status: AC
  Administered 2024-04-17: 140 mg via INTRAVENOUS
  Filled 2024-04-17: qty 14

## 2024-04-17 MED ORDER — SODIUM CHLORIDE 0.9 % IV SOLN: Freq: Once | INTRAVENOUS | Status: AC

## 2024-04-17 MED ORDER — DEXAMETHASONE SOD PHOSPHATE PF 10 MG/ML IJ SOLN
10.0000 mg | Freq: Once | INTRAMUSCULAR | Status: AC
  Administered 2024-04-17: 10 mg via INTRAVENOUS
  Filled 2024-04-17: qty 1

## 2024-04-17 MED ORDER — PALONOSETRON HCL INJECTION 0.25 MG/5ML
0.2500 mg | Freq: Once | INTRAVENOUS | Status: AC
  Administered 2024-04-17: 0.25 mg via INTRAVENOUS
  Filled 2024-04-17: qty 5

## 2024-04-17 NOTE — Progress Notes (Signed)

## 2024-04-17 NOTE — Patient Instructions (Signed)
 [facility] [place] [facility]  Discharge Instructions: Thank you for choosing [facility] to provide your oncology and hematology care.  If you have a lab appointment with the Cancer Center - please note that after April 8th, 2024, all labs will be drawn in the cancer center.  You do not have to check in or register with the main entrance as you have in the past but will complete your check-in in the cancer center.  Wear comfortable clothing and clothing appropriate for easy access to any Portacath or PICC line.   We strive to give you quality time with your provider. You may need to reschedule your appointment if you arrive late (15 or more minutes).  Arriving late affects you and other patients whose appointments are after yours.  Also, if you miss three or more appointments without notifying the office, you may be dismissed from the clinic at the provider's discretion.      For prescription refill requests, have your pharmacy contact our office and allow 72 hours for refills to be completed.    Today you received the following chemotherapy and/or immunotherapy agents vidaza.       To help prevent nausea and vomiting after your treatment, we encourage you to take your nausea medication as directed.  BELOW ARE SYMPTOMS THAT SHOULD BE REPORTED IMMEDIATELY: *FEVER GREATER THAN 100.4 F (38 C) OR HIGHER *CHILLS OR SWEATING *NAUSEA AND VOMITING THAT IS NOT CONTROLLED WITH YOUR NAUSEA MEDICATION *UNUSUAL SHORTNESS OF BREATH *UNUSUAL BRUISING OR BLEEDING *URINARY PROBLEMS (pain or burning when urinating, or frequent urination) *BOWEL PROBLEMS (unusual diarrhea, constipation, pain near the anus) TENDERNESS IN MOUTH AND THROAT WITH OR WITHOUT PRESENCE OF ULCERS (sore throat, sores in mouth, or a toothache) UNUSUAL RASH, SWELLING OR PAIN  UNUSUAL VAGINAL DISCHARGE OR ITCHING   Items with * indicate a potential emergency and should be followed up  as soon as possible or go to the Emergency Department if any problems should occur.  Please show the CHEMOTHERAPY ALERT CARD or IMMUNOTHERAPY ALERT CARD at check-in to the Emergency Department and triage nurse.  Should you have questions after your visit or need to cancel or reschedule your appointment, please contact [facility] [place] - A DEPT OF [facility]ideg[facility] [phone]  and follow the prompts.  Office hours are 8:00 a.m. to 4:30 p.m. Monday - Friday. Please note that voicemails left after 4:00 p.m. may not be returned until the following business day.  We are closed weekends and major holidays. You have access to a nurse at all times for urgent questions. Please call the main number to the clinic [phone] and follow the prompts.  For any non-urgent questions, you may also contact your provider using MyChart. We now offer e-Visits for anyone 8 and older to request care online for non-urgent symptoms. For details visit [email or web address].   Also download the MyChart app! Go to the app store, search "MyChart", open the app, select [place], and log in with your MyChart username and password.

## 2024-05-24 ENCOUNTER — Inpatient Hospital Stay

## 2024-05-24 ENCOUNTER — Inpatient Hospital Stay: Admitting: Oncology

## 2024-05-24 ENCOUNTER — Inpatient Hospital Stay: Attending: Hematology

## 2024-05-25 ENCOUNTER — Inpatient Hospital Stay

## 2024-05-26 ENCOUNTER — Inpatient Hospital Stay

## 2024-05-27 ENCOUNTER — Inpatient Hospital Stay

## 2024-05-28 ENCOUNTER — Inpatient Hospital Stay
